# Patient Record
Sex: Female | Born: 1945
Health system: Southern US, Community
[De-identification: ages and names within clinical notes are randomized; demographics above are authoritative.]

## PROBLEM LIST (undated history)

## (undated) DIAGNOSIS — N183 Chronic kidney disease, stage 3 unspecified: Secondary | ICD-10-CM

## (undated) DIAGNOSIS — F40240 Claustrophobia: Secondary | ICD-10-CM

## (undated) DIAGNOSIS — Z87442 Personal history of urinary calculi: Secondary | ICD-10-CM

## (undated) DIAGNOSIS — I350 Nonrheumatic aortic (valve) stenosis: Secondary | ICD-10-CM

## (undated) DIAGNOSIS — F419 Anxiety disorder, unspecified: Secondary | ICD-10-CM

## (undated) DIAGNOSIS — M1990 Unspecified osteoarthritis, unspecified site: Secondary | ICD-10-CM

## (undated) DIAGNOSIS — C50919 Malignant neoplasm of unspecified site of unspecified female breast: Secondary | ICD-10-CM

## (undated) DIAGNOSIS — R011 Cardiac murmur, unspecified: Secondary | ICD-10-CM

## (undated) DIAGNOSIS — K219 Gastro-esophageal reflux disease without esophagitis: Secondary | ICD-10-CM

## (undated) DIAGNOSIS — N182 Chronic kidney disease, stage 2 (mild): Secondary | ICD-10-CM

## (undated) DIAGNOSIS — E559 Vitamin D deficiency, unspecified: Secondary | ICD-10-CM

## (undated) DIAGNOSIS — R609 Edema, unspecified: Secondary | ICD-10-CM

## (undated) DIAGNOSIS — E790 Hyperuricemia without signs of inflammatory arthritis and tophaceous disease: Secondary | ICD-10-CM

## (undated) DIAGNOSIS — M48061 Spinal stenosis, lumbar region without neurogenic claudication: Secondary | ICD-10-CM

## (undated) DIAGNOSIS — I359 Nonrheumatic aortic valve disorder, unspecified: Secondary | ICD-10-CM

## (undated) DIAGNOSIS — I129 Hypertensive chronic kidney disease with stage 1 through stage 4 chronic kidney disease, or unspecified chronic kidney disease: Secondary | ICD-10-CM

## (undated) HISTORY — PX: ABDOMINAL HYSTERECTOMY: SUR658

## (undated) HISTORY — PX: CHOLECYSTECTOMY: SHX55

## (undated) HISTORY — PX: BACK SURGERY: SHX140

## (undated) HISTORY — PX: APPENDECTOMY: SHX54

## (undated) HISTORY — DX: Hypertensive chronic kidney disease with stage 1 through stage 4 chronic kidney disease, or unspecified chronic kidney disease: N18.2

## (undated) HISTORY — DX: Unspecified osteoarthritis, unspecified site: M19.90

## (undated) HISTORY — PX: GALLBLADDER SURGERY: SHX652

## (undated) HISTORY — DX: Hyperuricemia without signs of inflammatory arthritis and tophaceous disease: E79.0

## (undated) HISTORY — DX: Edema, unspecified: R60.9

## (undated) HISTORY — DX: Vitamin D deficiency, unspecified: E55.9

## (undated) HISTORY — DX: Nonrheumatic aortic valve disorder, unspecified: I35.9

## (undated) HISTORY — PX: TONSILLECTOMY: SUR1361

## (undated) HISTORY — PX: ABDOMINAL HYSTERECTOMY: SHX81

## (undated) HISTORY — DX: Spinal stenosis, lumbar region without neurogenic claudication: M48.061

## (undated) HISTORY — DX: Gastro-esophageal reflux disease without esophagitis: K21.9

## (undated) HISTORY — DX: Hypertensive chronic kidney disease with stage 1 through stage 4 chronic kidney disease, or unspecified chronic kidney disease: I12.9

---

## 2000-05-25 ENCOUNTER — Emergency Department (HOSPITAL_COMMUNITY): Admission: EM | Admit: 2000-05-25 | Discharge: 2000-05-25 | Payer: Self-pay | Admitting: Emergency Medicine

## 2014-02-27 ENCOUNTER — Other Ambulatory Visit: Payer: Self-pay | Admitting: Orthopedic Surgery

## 2014-02-27 DIAGNOSIS — M5417 Radiculopathy, lumbosacral region: Secondary | ICD-10-CM

## 2014-03-13 ENCOUNTER — Other Ambulatory Visit: Payer: Self-pay

## 2014-04-08 DIAGNOSIS — R05 Cough: Secondary | ICD-10-CM | POA: Diagnosis not present

## 2014-04-08 DIAGNOSIS — Z6841 Body Mass Index (BMI) 40.0 and over, adult: Secondary | ICD-10-CM | POA: Diagnosis not present

## 2014-05-19 DIAGNOSIS — R768 Other specified abnormal immunological findings in serum: Secondary | ICD-10-CM | POA: Diagnosis not present

## 2014-05-19 DIAGNOSIS — M2550 Pain in unspecified joint: Secondary | ICD-10-CM | POA: Diagnosis not present

## 2014-05-19 DIAGNOSIS — M2560 Stiffness of unspecified joint, not elsewhere classified: Secondary | ICD-10-CM | POA: Diagnosis not present

## 2014-05-19 DIAGNOSIS — R7982 Elevated C-reactive protein (CRP): Secondary | ICD-10-CM | POA: Diagnosis not present

## 2014-06-18 DIAGNOSIS — H524 Presbyopia: Secondary | ICD-10-CM | POA: Diagnosis not present

## 2014-07-14 DIAGNOSIS — M2550 Pain in unspecified joint: Secondary | ICD-10-CM | POA: Diagnosis not present

## 2014-07-28 DIAGNOSIS — M2550 Pain in unspecified joint: Secondary | ICD-10-CM | POA: Diagnosis not present

## 2014-07-28 DIAGNOSIS — Z7952 Long term (current) use of systemic steroids: Secondary | ICD-10-CM | POA: Diagnosis not present

## 2014-08-13 DIAGNOSIS — Z6841 Body Mass Index (BMI) 40.0 and over, adult: Secondary | ICD-10-CM | POA: Diagnosis not present

## 2014-08-13 DIAGNOSIS — R05 Cough: Secondary | ICD-10-CM | POA: Diagnosis not present

## 2014-08-13 DIAGNOSIS — J329 Chronic sinusitis, unspecified: Secondary | ICD-10-CM | POA: Diagnosis not present

## 2014-09-08 DIAGNOSIS — M150 Primary generalized (osteo)arthritis: Secondary | ICD-10-CM | POA: Diagnosis not present

## 2014-09-08 DIAGNOSIS — M2550 Pain in unspecified joint: Secondary | ICD-10-CM | POA: Diagnosis not present

## 2014-09-08 DIAGNOSIS — Z7952 Long term (current) use of systemic steroids: Secondary | ICD-10-CM | POA: Diagnosis not present

## 2014-09-18 DIAGNOSIS — Z1231 Encounter for screening mammogram for malignant neoplasm of breast: Secondary | ICD-10-CM | POA: Diagnosis not present

## 2014-09-18 DIAGNOSIS — E782 Mixed hyperlipidemia: Secondary | ICD-10-CM | POA: Diagnosis not present

## 2014-09-18 DIAGNOSIS — I129 Hypertensive chronic kidney disease with stage 1 through stage 4 chronic kidney disease, or unspecified chronic kidney disease: Secondary | ICD-10-CM | POA: Diagnosis not present

## 2014-09-18 DIAGNOSIS — Z1211 Encounter for screening for malignant neoplasm of colon: Secondary | ICD-10-CM | POA: Diagnosis not present

## 2014-09-18 DIAGNOSIS — N182 Chronic kidney disease, stage 2 (mild): Secondary | ICD-10-CM | POA: Diagnosis not present

## 2014-09-18 DIAGNOSIS — Z0000 Encounter for general adult medical examination without abnormal findings: Secondary | ICD-10-CM | POA: Diagnosis not present

## 2014-11-03 DIAGNOSIS — M2011 Hallux valgus (acquired), right foot: Secondary | ICD-10-CM | POA: Diagnosis not present

## 2014-11-03 DIAGNOSIS — M2012 Hallux valgus (acquired), left foot: Secondary | ICD-10-CM | POA: Diagnosis not present

## 2014-11-03 DIAGNOSIS — M19071 Primary osteoarthritis, right ankle and foot: Secondary | ICD-10-CM | POA: Diagnosis not present

## 2014-11-03 DIAGNOSIS — M19072 Primary osteoarthritis, left ankle and foot: Secondary | ICD-10-CM | POA: Diagnosis not present

## 2014-12-11 DIAGNOSIS — M79604 Pain in right leg: Secondary | ICD-10-CM | POA: Diagnosis not present

## 2014-12-11 DIAGNOSIS — E790 Hyperuricemia without signs of inflammatory arthritis and tophaceous disease: Secondary | ICD-10-CM | POA: Diagnosis not present

## 2014-12-11 DIAGNOSIS — M79671 Pain in right foot: Secondary | ICD-10-CM | POA: Diagnosis not present

## 2014-12-11 DIAGNOSIS — M79605 Pain in left leg: Secondary | ICD-10-CM | POA: Diagnosis not present

## 2015-01-01 DIAGNOSIS — H6691 Otitis media, unspecified, right ear: Secondary | ICD-10-CM | POA: Diagnosis not present

## 2015-01-01 DIAGNOSIS — Z6841 Body Mass Index (BMI) 40.0 and over, adult: Secondary | ICD-10-CM | POA: Diagnosis not present

## 2015-01-01 DIAGNOSIS — Z23 Encounter for immunization: Secondary | ICD-10-CM | POA: Diagnosis not present

## 2015-02-09 DIAGNOSIS — Z6841 Body Mass Index (BMI) 40.0 and over, adult: Secondary | ICD-10-CM | POA: Diagnosis not present

## 2015-02-09 DIAGNOSIS — M2550 Pain in unspecified joint: Secondary | ICD-10-CM | POA: Diagnosis not present

## 2015-03-10 DIAGNOSIS — R609 Edema, unspecified: Secondary | ICD-10-CM | POA: Diagnosis not present

## 2015-03-10 DIAGNOSIS — I5030 Unspecified diastolic (congestive) heart failure: Secondary | ICD-10-CM | POA: Diagnosis not present

## 2015-03-10 DIAGNOSIS — Z6841 Body Mass Index (BMI) 40.0 and over, adult: Secondary | ICD-10-CM | POA: Diagnosis not present

## 2015-04-27 DIAGNOSIS — J019 Acute sinusitis, unspecified: Secondary | ICD-10-CM | POA: Diagnosis not present

## 2015-04-27 DIAGNOSIS — R42 Dizziness and giddiness: Secondary | ICD-10-CM | POA: Diagnosis not present

## 2015-04-27 DIAGNOSIS — H5203 Hypermetropia, bilateral: Secondary | ICD-10-CM | POA: Diagnosis not present

## 2015-04-27 DIAGNOSIS — M2550 Pain in unspecified joint: Secondary | ICD-10-CM | POA: Diagnosis not present

## 2015-06-03 DIAGNOSIS — M79604 Pain in right leg: Secondary | ICD-10-CM | POA: Diagnosis not present

## 2015-06-03 DIAGNOSIS — M79605 Pain in left leg: Secondary | ICD-10-CM | POA: Diagnosis not present

## 2015-06-12 DIAGNOSIS — M461 Sacroiliitis, not elsewhere classified: Secondary | ICD-10-CM | POA: Diagnosis not present

## 2015-06-26 DIAGNOSIS — M461 Sacroiliitis, not elsewhere classified: Secondary | ICD-10-CM | POA: Diagnosis not present

## 2015-07-20 DIAGNOSIS — E782 Mixed hyperlipidemia: Secondary | ICD-10-CM | POA: Diagnosis not present

## 2015-07-20 DIAGNOSIS — K219 Gastro-esophageal reflux disease without esophagitis: Secondary | ICD-10-CM | POA: Diagnosis not present

## 2015-07-20 DIAGNOSIS — N182 Chronic kidney disease, stage 2 (mild): Secondary | ICD-10-CM | POA: Diagnosis not present

## 2015-07-20 DIAGNOSIS — I129 Hypertensive chronic kidney disease with stage 1 through stage 4 chronic kidney disease, or unspecified chronic kidney disease: Secondary | ICD-10-CM | POA: Diagnosis not present

## 2015-07-20 DIAGNOSIS — E790 Hyperuricemia without signs of inflammatory arthritis and tophaceous disease: Secondary | ICD-10-CM | POA: Diagnosis not present

## 2015-07-28 DIAGNOSIS — E870 Hyperosmolality and hypernatremia: Secondary | ICD-10-CM | POA: Diagnosis not present

## 2015-07-30 DIAGNOSIS — M25571 Pain in right ankle and joints of right foot: Secondary | ICD-10-CM | POA: Diagnosis not present

## 2015-07-30 DIAGNOSIS — M25572 Pain in left ankle and joints of left foot: Secondary | ICD-10-CM | POA: Diagnosis not present

## 2015-07-30 DIAGNOSIS — M25671 Stiffness of right ankle, not elsewhere classified: Secondary | ICD-10-CM | POA: Diagnosis not present

## 2015-07-30 DIAGNOSIS — M25672 Stiffness of left ankle, not elsewhere classified: Secondary | ICD-10-CM | POA: Diagnosis not present

## 2015-08-04 DIAGNOSIS — M25671 Stiffness of right ankle, not elsewhere classified: Secondary | ICD-10-CM | POA: Diagnosis not present

## 2015-08-04 DIAGNOSIS — M25672 Stiffness of left ankle, not elsewhere classified: Secondary | ICD-10-CM | POA: Diagnosis not present

## 2015-08-04 DIAGNOSIS — M25571 Pain in right ankle and joints of right foot: Secondary | ICD-10-CM | POA: Diagnosis not present

## 2015-08-04 DIAGNOSIS — M25572 Pain in left ankle and joints of left foot: Secondary | ICD-10-CM | POA: Diagnosis not present

## 2015-08-14 DIAGNOSIS — M25672 Stiffness of left ankle, not elsewhere classified: Secondary | ICD-10-CM | POA: Diagnosis not present

## 2015-08-14 DIAGNOSIS — M25572 Pain in left ankle and joints of left foot: Secondary | ICD-10-CM | POA: Diagnosis not present

## 2015-08-14 DIAGNOSIS — M25671 Stiffness of right ankle, not elsewhere classified: Secondary | ICD-10-CM | POA: Diagnosis not present

## 2015-08-14 DIAGNOSIS — M25571 Pain in right ankle and joints of right foot: Secondary | ICD-10-CM | POA: Diagnosis not present

## 2015-08-27 DIAGNOSIS — M25671 Stiffness of right ankle, not elsewhere classified: Secondary | ICD-10-CM | POA: Diagnosis not present

## 2015-08-27 DIAGNOSIS — M25672 Stiffness of left ankle, not elsewhere classified: Secondary | ICD-10-CM | POA: Diagnosis not present

## 2015-08-27 DIAGNOSIS — M25571 Pain in right ankle and joints of right foot: Secondary | ICD-10-CM | POA: Diagnosis not present

## 2015-08-27 DIAGNOSIS — M25572 Pain in left ankle and joints of left foot: Secondary | ICD-10-CM | POA: Diagnosis not present

## 2015-09-02 DIAGNOSIS — M25571 Pain in right ankle and joints of right foot: Secondary | ICD-10-CM | POA: Diagnosis not present

## 2015-09-02 DIAGNOSIS — M25672 Stiffness of left ankle, not elsewhere classified: Secondary | ICD-10-CM | POA: Diagnosis not present

## 2015-09-02 DIAGNOSIS — M25671 Stiffness of right ankle, not elsewhere classified: Secondary | ICD-10-CM | POA: Diagnosis not present

## 2015-09-02 DIAGNOSIS — M25572 Pain in left ankle and joints of left foot: Secondary | ICD-10-CM | POA: Diagnosis not present

## 2015-09-11 DIAGNOSIS — M25672 Stiffness of left ankle, not elsewhere classified: Secondary | ICD-10-CM | POA: Diagnosis not present

## 2015-09-11 DIAGNOSIS — M25671 Stiffness of right ankle, not elsewhere classified: Secondary | ICD-10-CM | POA: Diagnosis not present

## 2015-09-11 DIAGNOSIS — M25572 Pain in left ankle and joints of left foot: Secondary | ICD-10-CM | POA: Diagnosis not present

## 2015-09-11 DIAGNOSIS — M25571 Pain in right ankle and joints of right foot: Secondary | ICD-10-CM | POA: Diagnosis not present

## 2015-09-23 DIAGNOSIS — M25672 Stiffness of left ankle, not elsewhere classified: Secondary | ICD-10-CM | POA: Diagnosis not present

## 2015-09-23 DIAGNOSIS — M25571 Pain in right ankle and joints of right foot: Secondary | ICD-10-CM | POA: Diagnosis not present

## 2015-09-23 DIAGNOSIS — M25572 Pain in left ankle and joints of left foot: Secondary | ICD-10-CM | POA: Diagnosis not present

## 2015-09-23 DIAGNOSIS — M25671 Stiffness of right ankle, not elsewhere classified: Secondary | ICD-10-CM | POA: Diagnosis not present

## 2015-10-01 DIAGNOSIS — M25572 Pain in left ankle and joints of left foot: Secondary | ICD-10-CM | POA: Diagnosis not present

## 2015-10-01 DIAGNOSIS — M25671 Stiffness of right ankle, not elsewhere classified: Secondary | ICD-10-CM | POA: Diagnosis not present

## 2015-10-01 DIAGNOSIS — M25672 Stiffness of left ankle, not elsewhere classified: Secondary | ICD-10-CM | POA: Diagnosis not present

## 2015-10-01 DIAGNOSIS — M25571 Pain in right ankle and joints of right foot: Secondary | ICD-10-CM | POA: Diagnosis not present

## 2015-10-09 DIAGNOSIS — M25551 Pain in right hip: Secondary | ICD-10-CM | POA: Diagnosis not present

## 2015-10-09 DIAGNOSIS — M25572 Pain in left ankle and joints of left foot: Secondary | ICD-10-CM | POA: Diagnosis not present

## 2015-10-09 DIAGNOSIS — M25571 Pain in right ankle and joints of right foot: Secondary | ICD-10-CM | POA: Diagnosis not present

## 2015-10-09 DIAGNOSIS — M5441 Lumbago with sciatica, right side: Secondary | ICD-10-CM | POA: Diagnosis not present

## 2015-10-16 DIAGNOSIS — M25551 Pain in right hip: Secondary | ICD-10-CM | POA: Diagnosis not present

## 2015-10-16 DIAGNOSIS — M5441 Lumbago with sciatica, right side: Secondary | ICD-10-CM | POA: Diagnosis not present

## 2015-10-16 DIAGNOSIS — M25571 Pain in right ankle and joints of right foot: Secondary | ICD-10-CM | POA: Diagnosis not present

## 2015-10-16 DIAGNOSIS — M25572 Pain in left ankle and joints of left foot: Secondary | ICD-10-CM | POA: Diagnosis not present

## 2015-10-21 DIAGNOSIS — M25551 Pain in right hip: Secondary | ICD-10-CM | POA: Diagnosis not present

## 2015-10-21 DIAGNOSIS — M25571 Pain in right ankle and joints of right foot: Secondary | ICD-10-CM | POA: Diagnosis not present

## 2015-10-21 DIAGNOSIS — M25572 Pain in left ankle and joints of left foot: Secondary | ICD-10-CM | POA: Diagnosis not present

## 2015-10-21 DIAGNOSIS — M5441 Lumbago with sciatica, right side: Secondary | ICD-10-CM | POA: Diagnosis not present

## 2015-11-05 DIAGNOSIS — M25551 Pain in right hip: Secondary | ICD-10-CM | POA: Diagnosis not present

## 2015-11-05 DIAGNOSIS — M25572 Pain in left ankle and joints of left foot: Secondary | ICD-10-CM | POA: Diagnosis not present

## 2015-11-05 DIAGNOSIS — M25571 Pain in right ankle and joints of right foot: Secondary | ICD-10-CM | POA: Diagnosis not present

## 2015-11-05 DIAGNOSIS — M5441 Lumbago with sciatica, right side: Secondary | ICD-10-CM | POA: Diagnosis not present

## 2015-11-19 DIAGNOSIS — M25572 Pain in left ankle and joints of left foot: Secondary | ICD-10-CM | POA: Diagnosis not present

## 2015-11-19 DIAGNOSIS — M5441 Lumbago with sciatica, right side: Secondary | ICD-10-CM | POA: Diagnosis not present

## 2015-11-19 DIAGNOSIS — M25551 Pain in right hip: Secondary | ICD-10-CM | POA: Diagnosis not present

## 2015-11-19 DIAGNOSIS — M25571 Pain in right ankle and joints of right foot: Secondary | ICD-10-CM | POA: Diagnosis not present

## 2015-11-27 DIAGNOSIS — M25551 Pain in right hip: Secondary | ICD-10-CM | POA: Diagnosis not present

## 2015-11-27 DIAGNOSIS — M5441 Lumbago with sciatica, right side: Secondary | ICD-10-CM | POA: Diagnosis not present

## 2015-11-27 DIAGNOSIS — M25571 Pain in right ankle and joints of right foot: Secondary | ICD-10-CM | POA: Diagnosis not present

## 2015-11-27 DIAGNOSIS — M25572 Pain in left ankle and joints of left foot: Secondary | ICD-10-CM | POA: Diagnosis not present

## 2015-12-16 DIAGNOSIS — Z23 Encounter for immunization: Secondary | ICD-10-CM | POA: Diagnosis not present

## 2015-12-16 DIAGNOSIS — M791 Myalgia: Secondary | ICD-10-CM | POA: Diagnosis not present

## 2016-01-06 DIAGNOSIS — M79662 Pain in left lower leg: Secondary | ICD-10-CM | POA: Diagnosis not present

## 2016-01-06 DIAGNOSIS — I10 Essential (primary) hypertension: Secondary | ICD-10-CM | POA: Diagnosis not present

## 2016-01-06 DIAGNOSIS — M25462 Effusion, left knee: Secondary | ICD-10-CM | POA: Diagnosis not present

## 2016-01-06 DIAGNOSIS — R52 Pain, unspecified: Secondary | ICD-10-CM | POA: Diagnosis not present

## 2016-01-06 DIAGNOSIS — E7800 Pure hypercholesterolemia, unspecified: Secondary | ICD-10-CM | POA: Diagnosis not present

## 2016-01-06 DIAGNOSIS — M79605 Pain in left leg: Secondary | ICD-10-CM | POA: Diagnosis not present

## 2016-01-06 DIAGNOSIS — M7989 Other specified soft tissue disorders: Secondary | ICD-10-CM | POA: Diagnosis not present

## 2016-01-12 DIAGNOSIS — M1712 Unilateral primary osteoarthritis, left knee: Secondary | ICD-10-CM | POA: Diagnosis not present

## 2016-01-18 DIAGNOSIS — N182 Chronic kidney disease, stage 2 (mild): Secondary | ICD-10-CM | POA: Diagnosis not present

## 2016-01-18 DIAGNOSIS — E790 Hyperuricemia without signs of inflammatory arthritis and tophaceous disease: Secondary | ICD-10-CM | POA: Diagnosis not present

## 2016-01-18 DIAGNOSIS — I129 Hypertensive chronic kidney disease with stage 1 through stage 4 chronic kidney disease, or unspecified chronic kidney disease: Secondary | ICD-10-CM | POA: Diagnosis not present

## 2016-01-18 DIAGNOSIS — E782 Mixed hyperlipidemia: Secondary | ICD-10-CM | POA: Diagnosis not present

## 2016-01-26 DIAGNOSIS — M1712 Unilateral primary osteoarthritis, left knee: Secondary | ICD-10-CM | POA: Diagnosis not present

## 2016-02-02 DIAGNOSIS — M1712 Unilateral primary osteoarthritis, left knee: Secondary | ICD-10-CM | POA: Diagnosis not present

## 2016-02-02 DIAGNOSIS — I129 Hypertensive chronic kidney disease with stage 1 through stage 4 chronic kidney disease, or unspecified chronic kidney disease: Secondary | ICD-10-CM | POA: Diagnosis not present

## 2016-02-02 DIAGNOSIS — M2550 Pain in unspecified joint: Secondary | ICD-10-CM | POA: Diagnosis not present

## 2016-02-02 DIAGNOSIS — E782 Mixed hyperlipidemia: Secondary | ICD-10-CM | POA: Diagnosis not present

## 2016-03-08 DIAGNOSIS — J0110 Acute frontal sinusitis, unspecified: Secondary | ICD-10-CM | POA: Diagnosis not present

## 2016-03-21 DIAGNOSIS — M7062 Trochanteric bursitis, left hip: Secondary | ICD-10-CM | POA: Diagnosis not present

## 2016-04-12 DIAGNOSIS — M1712 Unilateral primary osteoarthritis, left knee: Secondary | ICD-10-CM | POA: Diagnosis not present

## 2016-04-25 DIAGNOSIS — H25813 Combined forms of age-related cataract, bilateral: Secondary | ICD-10-CM | POA: Diagnosis not present

## 2016-05-02 DIAGNOSIS — M7062 Trochanteric bursitis, left hip: Secondary | ICD-10-CM | POA: Diagnosis not present

## 2016-05-25 DIAGNOSIS — M48062 Spinal stenosis, lumbar region with neurogenic claudication: Secondary | ICD-10-CM | POA: Diagnosis not present

## 2016-05-31 ENCOUNTER — Other Ambulatory Visit (HOSPITAL_COMMUNITY): Payer: Self-pay | Admitting: Orthopedic Surgery

## 2016-05-31 DIAGNOSIS — M48062 Spinal stenosis, lumbar region with neurogenic claudication: Secondary | ICD-10-CM

## 2016-06-13 DIAGNOSIS — M79605 Pain in left leg: Secondary | ICD-10-CM | POA: Diagnosis not present

## 2016-06-13 DIAGNOSIS — M79604 Pain in right leg: Secondary | ICD-10-CM | POA: Diagnosis not present

## 2016-06-13 DIAGNOSIS — I5030 Unspecified diastolic (congestive) heart failure: Secondary | ICD-10-CM | POA: Diagnosis not present

## 2016-06-22 DIAGNOSIS — M79604 Pain in right leg: Secondary | ICD-10-CM | POA: Diagnosis not present

## 2016-06-22 DIAGNOSIS — M79605 Pain in left leg: Secondary | ICD-10-CM | POA: Diagnosis not present

## 2016-06-22 DIAGNOSIS — R600 Localized edema: Secondary | ICD-10-CM | POA: Diagnosis not present

## 2016-06-27 DIAGNOSIS — M79605 Pain in left leg: Secondary | ICD-10-CM | POA: Diagnosis not present

## 2016-06-27 DIAGNOSIS — M79604 Pain in right leg: Secondary | ICD-10-CM | POA: Diagnosis not present

## 2016-06-30 ENCOUNTER — Ambulatory Visit (HOSPITAL_COMMUNITY): Payer: Medicare Other

## 2016-06-30 ENCOUNTER — Encounter (HOSPITAL_COMMUNITY): Admission: RE | Payer: Self-pay | Source: Ambulatory Visit

## 2016-06-30 ENCOUNTER — Encounter (HOSPITAL_COMMUNITY): Payer: Self-pay

## 2016-06-30 ENCOUNTER — Ambulatory Visit (HOSPITAL_COMMUNITY): Admission: RE | Admit: 2016-06-30 | Payer: Medicare Other | Source: Ambulatory Visit | Admitting: Orthopedic Surgery

## 2016-06-30 SURGERY — RADIOLOGY WITH ANESTHESIA
Anesthesia: General

## 2016-07-04 DIAGNOSIS — M79604 Pain in right leg: Secondary | ICD-10-CM | POA: Diagnosis not present

## 2016-07-04 DIAGNOSIS — M79605 Pain in left leg: Secondary | ICD-10-CM | POA: Diagnosis not present

## 2016-07-12 DIAGNOSIS — M1712 Unilateral primary osteoarthritis, left knee: Secondary | ICD-10-CM | POA: Diagnosis not present

## 2016-07-12 DIAGNOSIS — M48062 Spinal stenosis, lumbar region with neurogenic claudication: Secondary | ICD-10-CM | POA: Diagnosis not present

## 2016-08-17 DIAGNOSIS — G894 Chronic pain syndrome: Secondary | ICD-10-CM | POA: Diagnosis not present

## 2016-08-17 DIAGNOSIS — M5136 Other intervertebral disc degeneration, lumbar region: Secondary | ICD-10-CM | POA: Diagnosis not present

## 2016-08-17 DIAGNOSIS — M47816 Spondylosis without myelopathy or radiculopathy, lumbar region: Secondary | ICD-10-CM | POA: Diagnosis not present

## 2016-08-17 DIAGNOSIS — M179 Osteoarthritis of knee, unspecified: Secondary | ICD-10-CM | POA: Diagnosis not present

## 2016-08-25 DIAGNOSIS — M47816 Spondylosis without myelopathy or radiculopathy, lumbar region: Secondary | ICD-10-CM | POA: Diagnosis not present

## 2016-09-07 DIAGNOSIS — G894 Chronic pain syndrome: Secondary | ICD-10-CM | POA: Diagnosis not present

## 2016-09-07 DIAGNOSIS — M179 Osteoarthritis of knee, unspecified: Secondary | ICD-10-CM | POA: Diagnosis not present

## 2016-09-07 DIAGNOSIS — M47816 Spondylosis without myelopathy or radiculopathy, lumbar region: Secondary | ICD-10-CM | POA: Diagnosis not present

## 2016-09-07 DIAGNOSIS — M5136 Other intervertebral disc degeneration, lumbar region: Secondary | ICD-10-CM | POA: Diagnosis not present

## 2016-10-26 DIAGNOSIS — M47816 Spondylosis without myelopathy or radiculopathy, lumbar region: Secondary | ICD-10-CM | POA: Diagnosis not present

## 2016-11-09 DIAGNOSIS — M179 Osteoarthritis of knee, unspecified: Secondary | ICD-10-CM | POA: Diagnosis not present

## 2016-11-09 DIAGNOSIS — M5136 Other intervertebral disc degeneration, lumbar region: Secondary | ICD-10-CM | POA: Diagnosis not present

## 2016-11-09 DIAGNOSIS — M47816 Spondylosis without myelopathy or radiculopathy, lumbar region: Secondary | ICD-10-CM | POA: Diagnosis not present

## 2016-11-29 DIAGNOSIS — Z23 Encounter for immunization: Secondary | ICD-10-CM | POA: Diagnosis not present

## 2016-11-30 DIAGNOSIS — Z23 Encounter for immunization: Secondary | ICD-10-CM | POA: Diagnosis not present

## 2016-11-30 DIAGNOSIS — M79604 Pain in right leg: Secondary | ICD-10-CM | POA: Diagnosis not present

## 2016-11-30 DIAGNOSIS — R5383 Other fatigue: Secondary | ICD-10-CM | POA: Diagnosis not present

## 2016-11-30 DIAGNOSIS — M79605 Pain in left leg: Secondary | ICD-10-CM | POA: Diagnosis not present

## 2016-11-30 DIAGNOSIS — Z1389 Encounter for screening for other disorder: Secondary | ICD-10-CM | POA: Diagnosis not present

## 2016-11-30 DIAGNOSIS — Z139 Encounter for screening, unspecified: Secondary | ICD-10-CM | POA: Diagnosis not present

## 2016-12-14 DIAGNOSIS — M47816 Spondylosis without myelopathy or radiculopathy, lumbar region: Secondary | ICD-10-CM | POA: Diagnosis not present

## 2016-12-14 DIAGNOSIS — M5136 Other intervertebral disc degeneration, lumbar region: Secondary | ICD-10-CM | POA: Diagnosis not present

## 2016-12-28 DIAGNOSIS — M5136 Other intervertebral disc degeneration, lumbar region: Secondary | ICD-10-CM | POA: Diagnosis not present

## 2016-12-28 DIAGNOSIS — M179 Osteoarthritis of knee, unspecified: Secondary | ICD-10-CM | POA: Diagnosis not present

## 2016-12-28 DIAGNOSIS — M47816 Spondylosis without myelopathy or radiculopathy, lumbar region: Secondary | ICD-10-CM | POA: Diagnosis not present

## 2017-01-02 DIAGNOSIS — E7800 Pure hypercholesterolemia, unspecified: Secondary | ICD-10-CM | POA: Diagnosis not present

## 2017-01-02 DIAGNOSIS — Z0000 Encounter for general adult medical examination without abnormal findings: Secondary | ICD-10-CM | POA: Diagnosis not present

## 2017-01-02 DIAGNOSIS — Z131 Encounter for screening for diabetes mellitus: Secondary | ICD-10-CM | POA: Diagnosis not present

## 2017-01-02 DIAGNOSIS — I5030 Unspecified diastolic (congestive) heart failure: Secondary | ICD-10-CM | POA: Diagnosis not present

## 2017-01-12 DIAGNOSIS — M179 Osteoarthritis of knee, unspecified: Secondary | ICD-10-CM | POA: Diagnosis not present

## 2017-02-17 DIAGNOSIS — M5136 Other intervertebral disc degeneration, lumbar region: Secondary | ICD-10-CM | POA: Diagnosis not present

## 2017-02-17 DIAGNOSIS — M25551 Pain in right hip: Secondary | ICD-10-CM | POA: Diagnosis not present

## 2017-02-17 DIAGNOSIS — G894 Chronic pain syndrome: Secondary | ICD-10-CM | POA: Diagnosis not present

## 2017-02-17 DIAGNOSIS — M47816 Spondylosis without myelopathy or radiculopathy, lumbar region: Secondary | ICD-10-CM | POA: Diagnosis not present

## 2017-02-22 DIAGNOSIS — Q253 Supravalvular aortic stenosis: Secondary | ICD-10-CM | POA: Diagnosis not present

## 2017-02-22 DIAGNOSIS — R600 Localized edema: Secondary | ICD-10-CM | POA: Diagnosis not present

## 2017-02-22 DIAGNOSIS — E559 Vitamin D deficiency, unspecified: Secondary | ICD-10-CM | POA: Diagnosis not present

## 2017-03-01 DIAGNOSIS — M159 Polyosteoarthritis, unspecified: Secondary | ICD-10-CM | POA: Diagnosis not present

## 2017-03-01 DIAGNOSIS — R600 Localized edema: Secondary | ICD-10-CM | POA: Diagnosis not present

## 2017-03-13 DIAGNOSIS — Z7189 Other specified counseling: Secondary | ICD-10-CM | POA: Diagnosis not present

## 2017-03-13 DIAGNOSIS — M48061 Spinal stenosis, lumbar region without neurogenic claudication: Secondary | ICD-10-CM | POA: Diagnosis not present

## 2017-03-17 ENCOUNTER — Ambulatory Visit: Payer: Medicare Other | Admitting: Neurology

## 2017-03-17 ENCOUNTER — Encounter: Payer: Self-pay | Admitting: Neurology

## 2017-03-17 VITALS — BP 128/80 | HR 82 | Ht 66.0 in | Wt 269.0 lb

## 2017-03-17 DIAGNOSIS — M79604 Pain in right leg: Secondary | ICD-10-CM

## 2017-03-17 DIAGNOSIS — IMO0001 Reserved for inherently not codable concepts without codable children: Secondary | ICD-10-CM

## 2017-03-17 DIAGNOSIS — S76399S Other specified injury of muscle, fascia and tendon of the posterior muscle group at thigh level, unspecified thigh, sequela: Principal | ICD-10-CM

## 2017-03-17 DIAGNOSIS — R200 Anesthesia of skin: Secondary | ICD-10-CM | POA: Diagnosis not present

## 2017-03-17 DIAGNOSIS — M79605 Pain in left leg: Secondary | ICD-10-CM

## 2017-03-17 DIAGNOSIS — S73199S Other sprain of unspecified hip, sequela: Secondary | ICD-10-CM | POA: Diagnosis not present

## 2017-03-17 MED ORDER — DICLOFENAC SODIUM 1 % TD GEL: 2.0000 g | Freq: Three times a day (TID) | TRANSDERMAL | 3 refills | Status: DC | PRN

## 2017-03-17 NOTE — Patient Instructions (Addendum)
Start diclofenac ointment to the back of the knees.  You can also try over the counter biofreeze, or nonsteroidal ointments  NCS/EMG of the legs  Referral to Dr. Tamala Julian with Sport Medicine

## 2017-03-17 NOTE — Progress Notes (Addendum)
[facility] Clinic Note - Initial Visit   Date: 03/17/17  Elizabeth [name] MRN: [id number] DOB: 04/14/1945   Dear Dr. Nyra Capes:  Thank you for your kind referral of Elizabeth [name] for consultation of bilateral leg pain. Although her history is well known to you, please allow [place] to reiterate it for the purpose of our medical record. The patient was accompanied to the clinic by granddaugther who also provides collateral information.     History of Present Illness: Elizabeth [name] is a 72 y.o. left-handed Caucasian female with morbid obesity, GERD, hypertension, CKD, and OA presenting for evaluation of bilateral leg pain.    Starting in October 2015, she suffered a fall because of sudden onset of left lateral leg pain and was unable to walk. There was no preceding injury or trauma.  She does not recall having any associated low back pain.  Around the same time, she developed similar discomfort on the right side.  The pain is described as sharp, burning, and sore, as if she has exercised the muscle too much.  It is located over the posterior thigh/buttocks and much worse distally behind the knee and hamstring insertion.  Her hamstrings are very sore and tender.   She does not have any pain with sitting or laying, but immediately develops pain with weight bearing such as standing or walking.  She denies any tingling, weakness, or radicular pain.  She has noticed new numbness over the distal sole of the right foot, but there is no associated pain and it does not correlate with her leg pain.  Pain was so severe initially that she required using crutches to ambulate.  Over the past year, she has been evaluated by orth[facility]adics, Dr. Donivan Scull.  She was offered NSAIDs, prednisone, muscle relaxants, tramadol, gabapentin, horizant, xtampsa, and hydrocodone with no relief.   She has some benefit with TENs unit.  She went to physical therapy for leg for possible nerve impingement.  She was unable  to get MRI (including open MRI) performed due to extreme anxiety.   Past Medical History:  Diagnosis Date  . Aortic valve disorder   . Benign hypertension with CKD (chronic kidney disease), stage II   . Edema   . GERD (gastroesophageal reflux disease)   . Hyperuricemia   . Lumbar stenosis   . Osteoarthrosis   . Vitamin D deficiency     Past Surgical History:  Procedure Laterality Date  . ABDOMINAL HYSTERECTOMY    . GALLBLADDER SURGERY       Medications:  Outpatient Encounter Medications as of 03/17/2017  Medication Sig  . cyanocobalamin 100 MCG tablet Take 100 mcg by mouth daily.  Marland Kitchen oxyCODONE-acetaminophen (PERCOCET/ROXICET) 5-325 MG tablet Take by mouth every 4 (four) hours as needed for severe pain.  Marland Kitchen diclofenac sodium (VOLTAREN) 1 % GEL Apply 2 g topically 3 (three) times daily as needed.   No facility-administered encounter medications on file as of 03/17/2017.      Allergies:  Allergies  Allergen Reactions  . Hydrocodone   . Lipitor [Atorvastatin]   . Penicillins   . Prednisone     Family History: Mother passed from Alzheimer's disease, 45. She has a sister who is healthy.  Social History: Social History   Tobacco Use  . Smoking status: Never Smoker  . Smokeless tobacco: Never Used  Substance Use Topics  . Alcohol use: No    Frequency: Never  . Drug use: No   Social History   Social  History Narrative   Lives with husband in a one story home.  Has 2 children.  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  Education: high school.      Review of Systems:  CONSTITUTIONAL: No fevers, chills, night sweats, or weight loss.   EYES: No visual changes or eye pain ENT: No hearing changes.  No history of nose bleeds.   RESPIRATORY: No cough, wheezing and shortness of breath.   CARDIOVASCULAR: Negative for chest pain, and palpitations.   GI: Negative for abdominal discomfort, blood in stools or black stools.  No recent change in bowel habits.   GU:  No history of  incontinence.   MUSCLOSKELETAL: +history of joint pain +swelling.  +myalgias.   SKIN: Negative for lesions, rash, and itching.   HEMATOLOGY/ONCOLOGY: Negative for prolonged bleeding, bruising easily, and swollen nodes.  No history of cancer.   ENDOCRINE: Negative for cold or heat intolerance, polydipsia or goiter.   PSYCH:  No depression or anxiety symptoms.   NEURO: As Above.   Vital Signs:  BP 128/80   Pulse 82   Ht 5\' 6"  (1.676 m)   Wt 269 lb (122 kg)   SpO2 92%   BMI 43.42 kg/m   General Medical Exam:   General:  Well appearing, comfortable.   Eyes/ENT: see cranial nerve examination.   Neck: No masses appreciated.  Full range of motion without tenderness.  No carotid bruits. Respiratory:  Clear to auscultation, good air entry bilaterally.   Cardiac:  Regular rate and rhythm, + systolic murmur.   Extremities:  No deformities, edema, or skin discoloration.  Skin:  No rashes or lesions.  Neurological Exam: MENTAL STATUS including orientation to time, place, person, recent and remote memory, attention span and concentration, language, and fund of knowledge is normal.  Speech is not dysarthric.  CRANIAL NERVES: II:  No visual field defects.  Unremarkable fundi.   III-IV-VI: Pupils equal round and reactive to light.  Normal conjugate, extra-ocular eye movements in all directions of gaze.  No nystagmus.  No ptosis.   V:  Normal facial sensation.     VII:  Normal facial symmetry and movements.  VIII:  Normal hearing and vestibular function.   IX-X:  Normal palatal movement.   XI:  Normal shoulder shrug and head rotation.   XII:  Normal tongue strength and range of motion, no deviation or fasciculation.  MOTOR:  No atrophy, fasciculations or abnormal movements.  No pronator drift.  Tone is normal.   She is tender to palpation over the hamstring tendons bilaterally.    Right Upper Extremity:    Left Upper Extremity:    Deltoid  5/5   Deltoid  5/5   Biceps  5/5   Biceps  5/5     Triceps  5/5   Triceps  5/5   Wrist extensors  5/5   Wrist extensors  5/5   Wrist flexors  5/5   Wrist flexors  5/5   Finger extensors  5/5   Finger extensors  5/5   Finger flexors  5/5   Finger flexors  5/5   Dorsal interossei  5/5   Dorsal interossei  5/5   Abductor pollicis  5/5   Abductor pollicis  5/5   Tone (Ashworth scale)  0  Tone (Ashworth scale)  0   Right Lower Extremity:    Left Lower Extremity:    Hip flexors  5/5   Hip flexors  5/5   Hip extensors  5/5  Hip extensors  5/5   Knee flexors  5/5   Knee flexors  5/5   Knee extensors  5/5   Knee extensors  5/5   Dorsiflexors  5/5   Dorsiflexors  5/5   Plantarflexors  5/5   Plantarflexors  5/5   Toe extensors  5/5   Toe extensors  5/5   Toe flexors  5/5   Toe flexors  5/5   Tone (Ashworth scale)  0  Tone (Ashworth scale)  0   MSRs:  Right                                                                 Left brachioradialis 2+  brachioradialis 2+  biceps 2+  biceps 2+  triceps 2+  triceps 2+  patellar 2+  patellar 2+  ankle jerk 1+  ankle jerk 2+  Hoffman no  Hoffman no  plantar response down  plantar response down   SENSORY:  Reduced vibration and temperature distally in the right foot only.  Pin prick is intact.  Sensation elsewhere is intact.   Normal and symmetric perception of light touch, pinprick, vibration, and proprioception.  Romberg's sign absent.   COORDINATION/GAIT: Normal finger-to- nose-finger and heel-to-shin.  Intact rapid alternating movements bilaterally.  Able to rise from a chair without using arms.  Gait is mildly antalgic and wide-based, unassisted.  IMPRESSION: Bilateral posterior distal thigh pain is more suggestive of musculoskeletal pain given the localized soreness to palpation and characteristic of burning/achy pain.  She does have numbness of the R foot, but these two symptoms seem independent of each other.  I will set her up for NCS/EMG of the legs to evaluation for radiculopathy or  neuropathy, but my overall suspicion for primary nerve pathology causing her localized thigh pain is low.  I will seek the opinion of Dr. Tamala Julian with Sports Medicine to see if this is more of a hamstring tendonitis, however, unusual to involve both legs.  She was unable to undergo open MRI because of claustrophobia, but if her work-up is unrevealing, may need to consider CT lumbar spine or MRI under sedation. She is not keen on taking medications by mouth so offered a trial of diclofenac ointment to her legs as well as OTC options.      Thank you for allowing me to participate in patient's care.  If I can answer any additional questions, I would be pleased to do so.    Sincerely,    Knox Holdman K. Posey Pronto, DO

## 2017-03-30 ENCOUNTER — Ambulatory Visit (INDEPENDENT_AMBULATORY_CARE_PROVIDER_SITE_OTHER): Payer: Medicare Other | Admitting: Neurology

## 2017-03-30 DIAGNOSIS — M79604 Pain in right leg: Secondary | ICD-10-CM | POA: Diagnosis not present

## 2017-03-30 DIAGNOSIS — R200 Anesthesia of skin: Secondary | ICD-10-CM | POA: Diagnosis not present

## 2017-03-30 DIAGNOSIS — S76399S Other specified injury of muscle, fascia and tendon of the posterior muscle group at thigh level, unspecified thigh, sequela: Secondary | ICD-10-CM

## 2017-03-30 DIAGNOSIS — S73199S Other sprain of unspecified hip, sequela: Secondary | ICD-10-CM | POA: Diagnosis not present

## 2017-03-30 DIAGNOSIS — M79605 Pain in left leg: Secondary | ICD-10-CM

## 2017-03-30 DIAGNOSIS — IMO0001 Reserved for inherently not codable concepts without codable children: Secondary | ICD-10-CM

## 2017-03-30 NOTE — Procedures (Signed)
[facility]  [address]  [place], [place] [address] Tel: [phone] Fax:  [phone] Test Date:  03/30/2017  Patient: Elizabeth [name]  DOB: 08-Jan-1946 Physician: Narda Amber, DO  Sex: Female Height: 5\' 6"  Ref Phys: Narda Amber, DO  ID#: [id number] Temp: 32.5C Technician:    Patient Complaints: This is 72 year old female referred for evaluation of bilateral leg pain and right foot numbness.  NCV & EMG Findings: Extensive electrodiagnostic testing of the right lower extremity and additional studies of the left shows:  1. Bilateral sural and superficial peroneal sensory responses are within normal limits. 2. Bilateral peroneal motor responses are within normal limits. Bilateral tibial motor responses show mildly reduced amplitude; in isolation, these findings are nonspecific and most likely due to localized compression. 3. Bilateral tibial H reflex studies are very mildly prolonged. 4. There is no evidence of active or chronic motor axon loss changes affecting any of the tested muscles. Motor unit configuration and recruitment pattern is within normal limits.  Impression: This is a normal study. There is no evidence of a lumbosacral radiculopathy or sensorimotor polyneuropathy affecting the lower extremities.   ___________________________ Narda Amber, DO    Nerve Conduction Studies Anti Sensory Summary Table   Site NR Peak (ms) Norm Peak (ms) P-T Amp (V) Norm P-T Amp  Left Sup Peroneal Anti Sensory (Ant Lat Mall)  32.5C  12 cm    3.2 <4.6 5.4 >3  Right Sup Peroneal Anti Sensory (Ant Lat Mall)  12 cm    3.3 <4.6 7.8 >3  Left Sural Anti Sensory (Lat Mall)  32.5C  Calf    4.0 <4.6 6.4 >3  Right Sural Anti Sensory (Lat Mall)  32.5C  Calf    3.5 <4.6 7.1 >3   Motor Summary Table   Site NR Onset (ms) Norm Onset (ms) O-P Amp (mV) Norm O-P Amp Site1 Site2 Delta-0 (ms) Dist (cm) Vel (m/s) Norm Vel (m/s)  Left Peroneal Motor (Ext Dig Brev)  32.5C    Ankle    3.8 <6.0 3.7 >2.5 B Fib Ankle 8.1 37.0 46 >40  B Fib    11.9  3.4  Poplt B Fib 1.7 8.0 47 >40  Poplt    13.6  3.4         Right Peroneal Motor (Ext Dig Brev)  32.5C  Ankle    3.3 <6.0 4.6 >2.5 B Fib Ankle 8.3 37.0 45 >40  B Fib    11.6  4.0  Poplt B Fib 1.4 8.0 57 >40  Poplt    13.0  3.9         Left Tibial Motor (Abd Hall Brev)  32.5C  Ankle    5.1 <6.0 2.5 >4 Knee Ankle 9.5 38.0 40 >40  Knee    14.6  1.4         Right Tibial Motor (Abd Hall Brev)  32.5C  Ankle    5.0 <6.0 2.5 >4 Knee Ankle 9.1 37.0 41 >40  Knee    14.1  1.2          H Reflex Studies   NR H-Lat (ms) Lat Norm (ms) L-R H-Lat (ms)  Left Tibial (Gastroc)  32.5C     37.55 <35 0.00  Right Tibial (Gastroc)  32.5C     37.55 <35 0.00   EMG   Side Muscle Ins Act Fibs Psw Fasc Number Recrt Dur Dur. Amp Amp. Poly Poly. Comment  Right AntTibialis Nml Nml Nml Nml Nml Nml  Nml Nml Nml Nml Nml Nml N/A  Right Gastroc Nml Nml Nml Nml Nml Nml Nml Nml Nml Nml Nml Nml N/A  Right Flex Dig Long Nml Nml Nml Nml Nml Nml Nml Nml Nml Nml Nml Nml N/A  Right RectFemoris Nml Nml Nml Nml Nml Nml Nml Nml Nml Nml Nml Nml N/A  Right GluteusMed Nml Nml Nml Nml Nml Nml Nml Nml Nml Nml Nml Nml N/A  Right BicepsFemS Nml Nml Nml Nml Nml Nml Nml Nml Nml Nml Nml Nml N/A  Left AntTibialis Nml Nml Nml Nml Nml Nml Nml Nml Nml Nml Nml Nml N/A  Left Gastroc Nml Nml Nml Nml Nml Nml Nml Nml Nml Nml Nml Nml N/A  Left Flex Dig Long Nml Nml Nml Nml Nml Nml Nml Nml Nml Nml Nml Nml N/A  Left RectFemoris Nml Nml Nml Nml Nml Nml Nml Nml Nml Nml Nml Nml N/A  Left GluteusMed Nml Nml Nml Nml Nml Nml Nml Nml Nml Nml Nml Nml N/A  Left BicepsFemS Nml Nml Nml Nml Nml Nml Nml Nml Nml Nml Nml Nml N/A      Waveforms:

## 2017-03-31 ENCOUNTER — Telehealth: Payer: Self-pay | Admitting: *Deleted

## 2017-03-31 NOTE — Telephone Encounter (Signed)
-----   Message from Alda Berthold, DO sent at 03/30/2017  2:28 PM EST ----- Please inform patient that her nerve and muscle testing was normal and her discomfort does not seem to be stemming from a primary nerve problem.  She is scheduled to see Sports Medicine next week. Thanks.

## 2017-03-31 NOTE — Telephone Encounter (Signed)
Patient given results and instructions.   

## 2017-04-05 ENCOUNTER — Ambulatory Visit: Payer: Medicare Other | Admitting: Family Medicine

## 2017-04-05 ENCOUNTER — Encounter: Payer: Self-pay | Admitting: Family Medicine

## 2017-04-05 DIAGNOSIS — M79605 Pain in left leg: Secondary | ICD-10-CM | POA: Diagnosis not present

## 2017-04-05 DIAGNOSIS — M4807 Spinal stenosis, lumbosacral region: Secondary | ICD-10-CM

## 2017-04-05 DIAGNOSIS — M79604 Pain in right leg: Secondary | ICD-10-CM | POA: Diagnosis not present

## 2017-04-05 MED ORDER — METHYLPREDNISOLONE ACETATE 80 MG/ML IJ SUSP
80.0000 mg | Freq: Once | INTRAMUSCULAR | Status: AC
  Administered 2017-04-05: 80 mg via INTRAMUSCULAR

## 2017-04-05 MED ORDER — KETOROLAC TROMETHAMINE 60 MG/2ML IM SOLN
60.0000 mg | Freq: Once | INTRAMUSCULAR | Status: AC
  Administered 2017-04-05: 60 mg via INTRAMUSCULAR

## 2017-04-05 NOTE — Assessment & Plan Note (Signed)
Patient has bilateral leg pain.  I do believe though that this is likely secondary to more of a spinal stenosis.  Patient did have an EMG that did not show any chronic radicular symptoms that are back concerning.  Patient was having severe pain.  Patient has had x-rays from an outside facility and she was told that she does have degenerative disc disease.  We will attempt to get this.  Patient is claustrophobic and is unable to have an MRI.  I do believe that a CT myelogram could be beneficial and will help [place] with with diagnosis.  Patient could be a candidate for an epidural injection with her not on any type of blood thinner at this time.  We discussed icing regimen, topical anti-inflammatories given to try to help with a little bit of discomfort.  Toradol and Depo-Medrol given today as well.  We discussed if worsening symptoms to seek medical attention immediately.  Differential includes possible vascular compromise but I do not see any signs on exam today.  Possible ABI would be necessary.  May also want to consider CT abdomen and pelvis if continuing not to find anything of great value.  Depending on imaging will discuss further about follow-up.

## 2017-04-05 NOTE — Progress Notes (Signed)
[facility] [address] [place], [place] [address] Phone: [phone] Subjective:    I'm seeing this patient by the request  of:  Patel DO   CC: Bilateral leg pain  GMW:[id number]  Elizabeth [name] is a 72 y.o. female coming in with complaint of bilateral leg pain.  Has been going on for quite some time.  States that he has had difficulty since a fall in October 2015.  [place] due to like it happened because of left lateral leg pain.  Patient states since then she has intermittent sharp burning and sore sensation and seems to be worse with increasing activity.  Seems to be located over the thigh and buttocks area and goes down to the posterior aspect of the knees bilaterally.  Feels that her hamstrings are significantly sore and tender as well.  Denies any weakness.  Patient has been on multiple different medications over the course of time.  Has even attempted physical therapy as well as TENS unit with very mild benefit.  Patient has been unable to get advanced imaging secondary to claustrophobia.  States she doesn't want anymore pain pills. States she went on vacation and when she came back and felt the pain. When she stands up to walk she feels like something is "ripping her legs apart". Last Sunday she fell trying to get up out a chair. Says she could not walk. Has been taking a lot of Ibuprofen (doesn't help). Has had injections that did not work. She hurts every day. She's made ADL changes at home. Posterior leg pain. Right foot on the lateral side is numb.  Seems to be more constant.   Patient did have an EMG done on March 30, 2017 that showed no neurologic deficit was constant Patient states that she has had significant amount of laboratory workup as well and nothing has really been found.  Past Medical History:  Diagnosis Date  . Aortic valve disorder   . Benign hypertension with CKD (chronic kidney disease), stage II   . Edema   . GERD (gastroesophageal  reflux disease)   . Hyperuricemia   . Lumbar stenosis   . Osteoarthrosis   . Vitamin D deficiency    Past Surgical History:  Procedure Laterality Date  . ABDOMINAL HYSTERECTOMY    . GALLBLADDER SURGERY     Social History   Socioeconomic History  . Marital status: Married    Spouse name: None  . Number of children: 2  . Years of education: 47  . Highest education level: None  Social Needs  . Financial resource strain: None  . Food insecurity - worry: None  . Food insecurity - inability: None  . Transportation needs - medical: None  . Transportation needs - non-medical: None  Occupational History  . Occupation: child care  Tobacco Use  . Smoking status: Never Smoker  . Smokeless tobacco: Never Used  Substance and Sexual Activity  . Alcohol use: No    Frequency: Never  . Drug use: No  . Sexual activity: None  Other Topics Concern  . None  Social History Narrative   Lives with husband in a one story home.  Has 2 children.  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  Education: high school.     Allergies  Allergen Reactions  . Hydrocodone   . Lipitor [Atorvastatin]   . Penicillins   . Prednisone    No family history on file.   Past medical history, social, surgical and  family history all reviewed in electronic medical record.  No pertanent information unless stated regarding to the chief complaint.   Review of Systems:Review of systems updated and as accurate as of 04/05/17  No headache, visual changes, nausea, vomiting, diarrhea, constipation, dizziness, abdominal pain, skin rash, fevers, chills, night sweats, weight loss, swollen lymph nodes, body aches, joint swelling,  chest pain, shortness of breath, mood changes.  Positive muscle aches  Objective  Blood pressure (!) 170/90, pulse 79, height 5\' 5"  (1.651 m), weight 263 lb (119.3 kg), SpO2 97 %. Systems examined below as of 04/05/17   General: No apparent distress alert and oriented x3 mood and affect normal, dressed  appropriately.  HEENT: Pupils equal, extraocular movements intact  Respiratory: Patient's speak in full sentences and does not appear short of breath  Cardiovascular: Trace lower extremity edema, non tender, no erythema  Skin: Warm dry intact with no signs of infection or rash on extremities or on axial skeleton.  Abdomen: Soft nontender obese Neuro: Cranial nerves II through XII are intact, neurovascularly intact in all extremities with 2+ DTRs and 2+ pulses.  Lymph: No lymphadenopathy of posterior or anterior cervical chain or axillae bilaterally.  Gait mild broad-based and antalgic MSK: Mild tender with full range of motion and good stability and symmetric strength and tone of shoulders, elbows, wrist, hip, knee and ankles bilaterally.  Changes of multiple joints Tender to palpation over the proximal cath.  Straight leg test does elicit cramping of the legs bilaterally.  Minimal discomfort in the lumbar spine with loss of lordosis and some degenerative scoliosis but minimal pain to palpation.    Impression and Recommendations:     This case required medical decision making of moderate complexity.      Note: This dictation was prepared with Dragon dictation along with smaller phrase technology. Any transcriptional errors that result from this process are unintentional.

## 2017-04-05 NOTE — Patient Instructions (Addendum)
Good to see you  CT myelogram of the back will be order and see what we can find  IN the meantime I would get  Vitamin D 2000 IU daily  Turmeric 500mg  daily  Tart cherry extract any dose at night I think you will likely have some type of spinal stenosis and we will see what we can find.  After the CT scan we will see if we can order an injection to help with the pain as well IF we do the injection then I want to see you again 2 weeks after and see how you are doing

## 2017-04-12 ENCOUNTER — Other Ambulatory Visit: Payer: Medicare Other

## 2017-04-12 ENCOUNTER — Inpatient Hospital Stay: Admission: RE | Admit: 2017-04-12 | Payer: Medicare Other | Source: Ambulatory Visit

## 2017-04-12 ENCOUNTER — Ambulatory Visit
Admission: RE | Admit: 2017-04-12 | Discharge: 2017-04-12 | Disposition: A | Discharge status: Home / Self Care | Payer: Medicare Other | Source: Ambulatory Visit | Attending: Family Medicine | Admitting: Family Medicine

## 2017-04-12 ENCOUNTER — Other Ambulatory Visit: Payer: Self-pay | Admitting: *Deleted

## 2017-04-12 DIAGNOSIS — M4807 Spinal stenosis, lumbosacral region: Secondary | ICD-10-CM

## 2017-04-12 DIAGNOSIS — M545 Low back pain: Secondary | ICD-10-CM | POA: Diagnosis not present

## 2017-04-12 IMAGING — CT CT L SPINE W/O CM
4 of 9 series · 13 of 33 positions shown, 15 images · non-contrast
Comparison: None.

CLINICAL DATA: Low back pain and bilateral leg pain for 15 months.
Evaluate spinal stenosis. No acute injury or prior relevant surgery.

EXAM:
CT LUMBAR SPINE WITHOUT CONTRAST
TECHNIQUE: Multidetector CT imaging of the lumbar spine was performed without
intravenous contrast administration. Multiplanar CT image
reconstructions were also generated.

[Series 4: l spine bone · axial · 0.27mm/px · z∈[-230,-157]mm · 2 of 88 slices shown]
[im 30/88  bone]
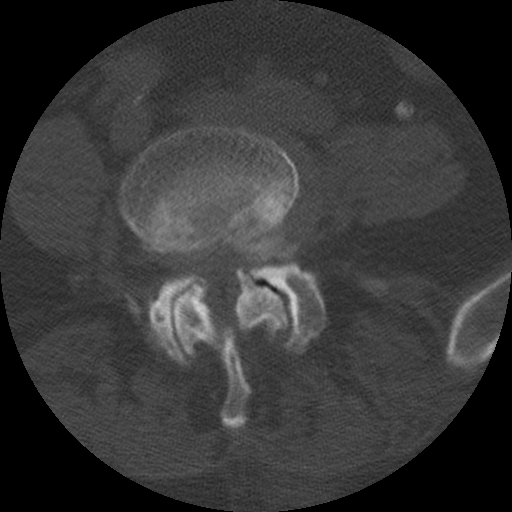
[im 59/88  bone]
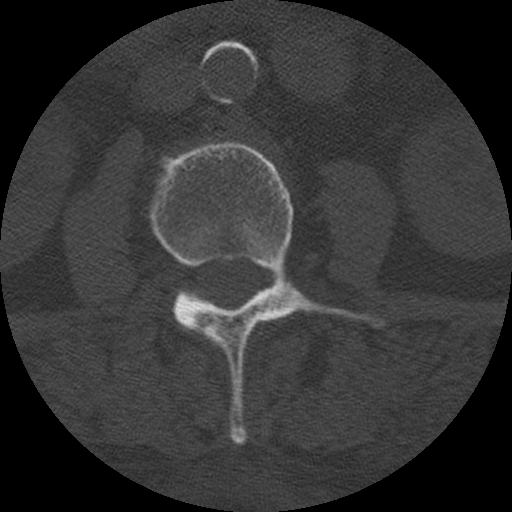

[Series 5: l-spine detail · axial · 0.27mm/px · z∈[-250,-140]mm · 3 of 88 slices shown, 4 images]
[im 22/88  soft-tissue]
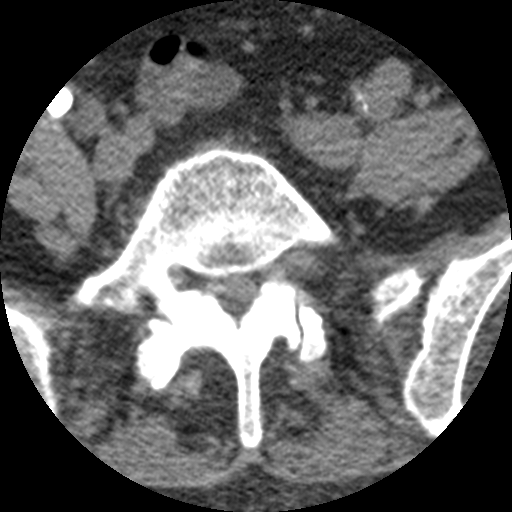
[im 22/88  bone]
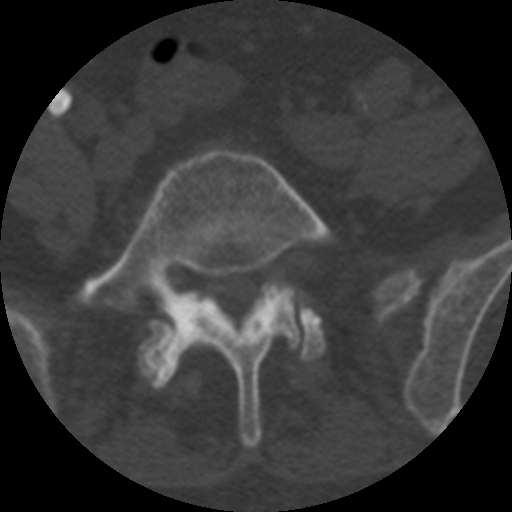
[im 44/88  bone]
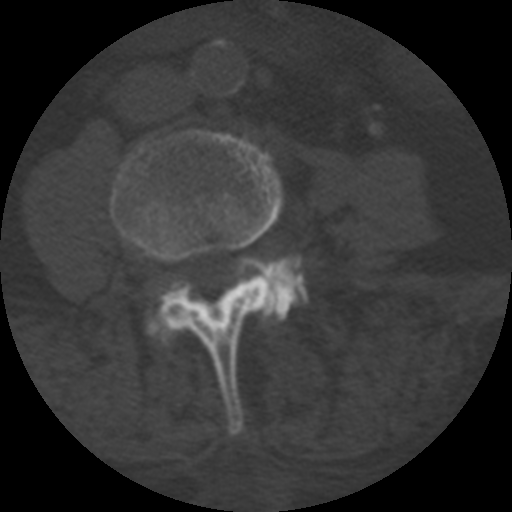
[im 66/88  bone]
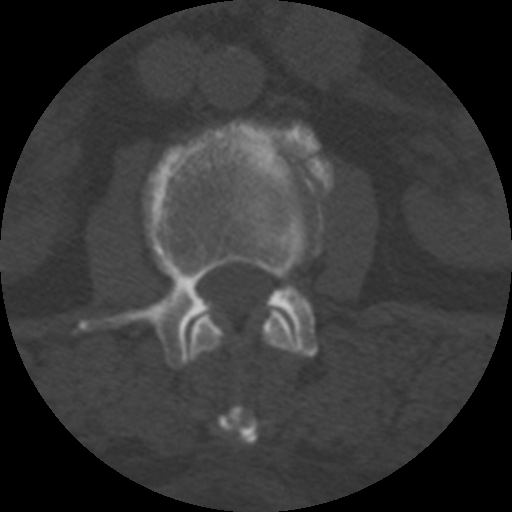

[Series 200: cor · coronal · 0.44mm/px · 5 of 35 slices shown, 6 images]
[im 12/35  bone]
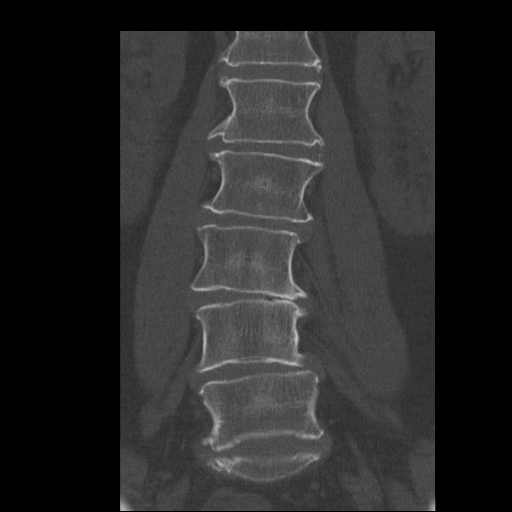
[im 15/35  bone]
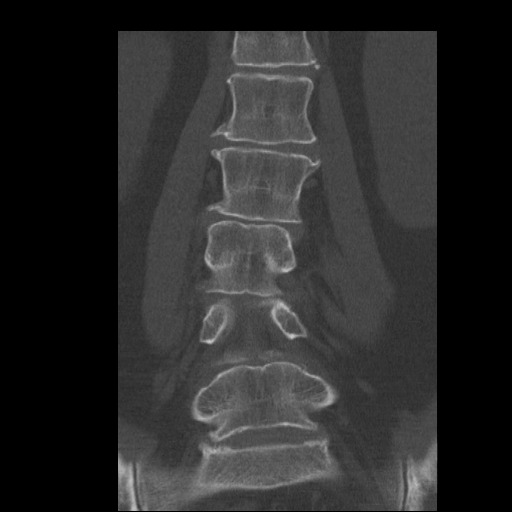
[im 18/35  soft-tissue]
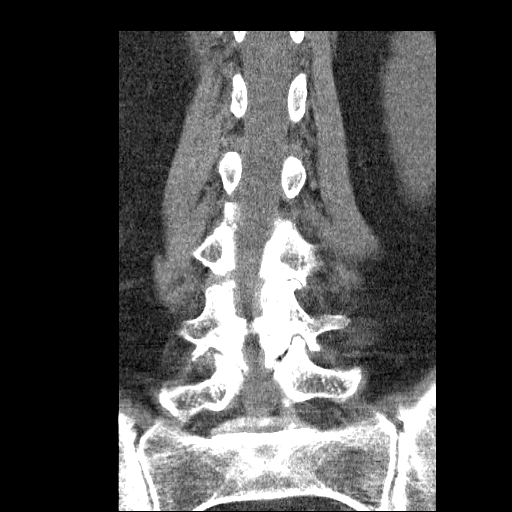
[im 18/35  bone]
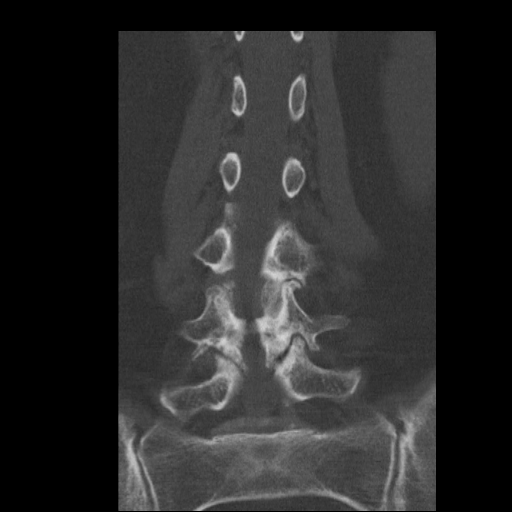
[im 20/35  bone]
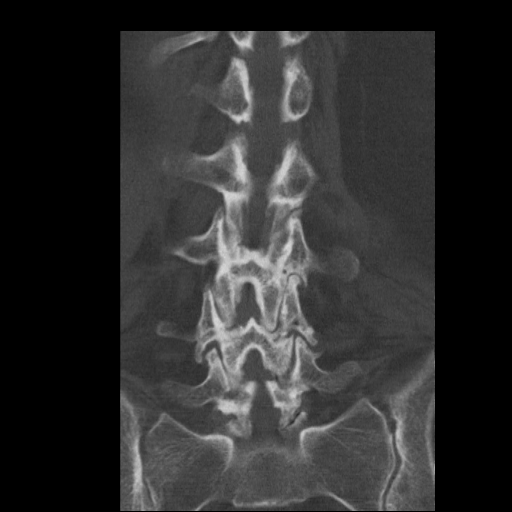
[im 23/35  bone]
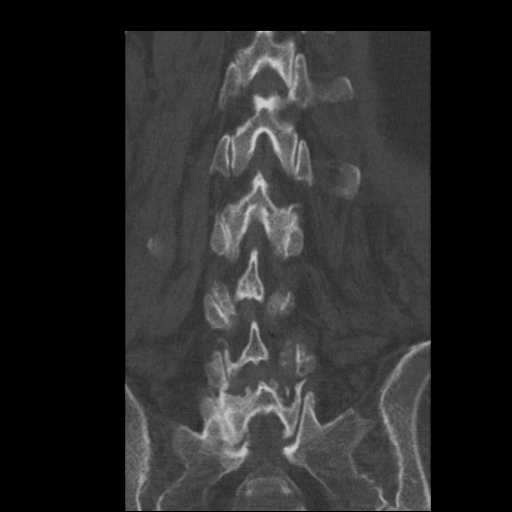

[Series 201: sag · sagittal · 0.44mm/px · 3 of 65 slices shown]
[im 13/65  bone]
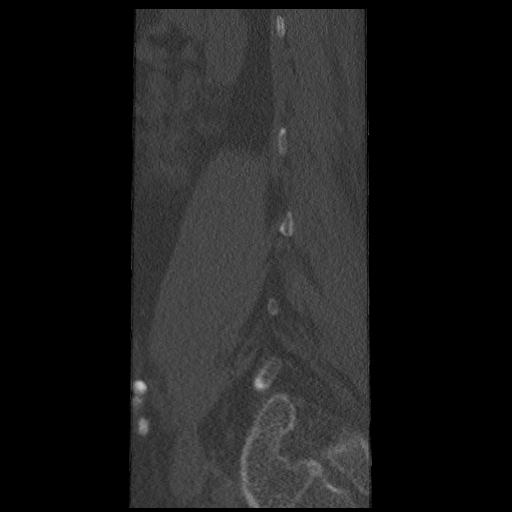
[im 26/65  bone]
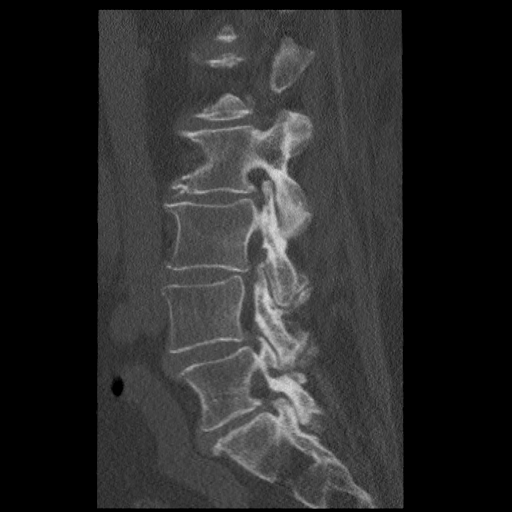
[im 39/65  bone]
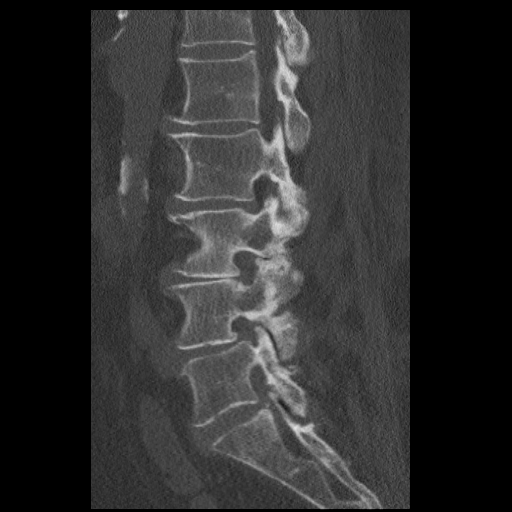

[13 of 33 positions shown; findings below may reference images not displayed]

FINDINGS: Segmentation: 5 lumbar type vertebral bodies.

Alignment: There is a mild convex right scoliosis. There is 2 mm of
degenerative anterolisthesis at L4-5.

Vertebrae: No evidence of acute fracture, pars defect or focal
osseous lesion. The spinal canal is relatively small on a congenital
basis. Moderate facet degenerative changes are present inferiorly.

Paraspinal and other soft tissues: The paraspinal soft tissues
appear normal. There is aortic and branch vessel atherosclerosis.
There is a 5 mm calculus in the upper pole of the right kidney. No
evidence of hydronephrosis. Multiple paraspinal calcifications
likely phleboliths within the gonadal veins.

Disc levels:

L1-2: Loss of disc height with annular disc bulging and endplate
osteophytes asymmetric to the right. No significant spinal stenosis
or nerve root encroachment.

L2-3: Loss of disc height with annular disc bulging and endplate
osteophytes asymmetric to the right. Mild facet and ligamentous
hypertrophy. These factors contribute to moderate spinal stenosis
with asymmetric narrowing of the right lateral recess. The right
foramen is also mildly narrowed without definite L2 nerve root
encroachment.

L3-4: Mild loss of disc height with annular disc bulging and
endplate osteophytes asymmetric to the left. Moderate facet and
ligamentous hypertrophy contributing to moderate multifactorial
spinal stenosis. Both lateral recesses and the left foramen are
mildly narrowed.

L4-5: Severe multifactorial spinal stenosis secondary to loss of
disc height with annular disc bulging eccentric to the left and
advanced facet and ligamentous hypertrophy, also worse on the left.
Both lateral recesses are narrowed and there is moderate foraminal
narrowing on the left.

L5-S1: Disc bulging and endplate osteophytes are more symmetric at
this level. There is advanced bilateral facet hypertrophy. Mild
spinal stenosis and mild narrowing of the lateral recesses. The
foramina are sufficiently patent.
IMPRESSION: 1. Multilevel spondylosis superimposed on a congenitally small
canal.
2. Resulting severe multifactorial spinal stenosis at L4-5. Moderate
spinal stenosis at L2-3 and L3-4.
3. Variable narrowing of the lateral recesses and foramina as
detailed above. Foraminal narrowing appears worst on the left at
L4-5.
4. 5 mm right renal calculus.  Aortic Atherosclerosis ([66]-[66]).

## 2017-04-13 ENCOUNTER — Other Ambulatory Visit: Payer: Self-pay

## 2017-04-13 DIAGNOSIS — M4800 Spinal stenosis, site unspecified: Secondary | ICD-10-CM

## 2017-04-13 NOTE — Progress Notes (Signed)
Sent referral to neurosurgery

## 2017-04-21 DIAGNOSIS — M48062 Spinal stenosis, lumbar region with neurogenic claudication: Secondary | ICD-10-CM | POA: Diagnosis not present

## 2017-04-21 DIAGNOSIS — I10 Essential (primary) hypertension: Secondary | ICD-10-CM | POA: Diagnosis not present

## 2017-04-21 DIAGNOSIS — R030 Elevated blood-pressure reading, without diagnosis of hypertension: Secondary | ICD-10-CM | POA: Diagnosis not present

## 2017-05-05 ENCOUNTER — Other Ambulatory Visit: Payer: Self-pay | Admitting: Neurosurgery

## 2017-05-08 DIAGNOSIS — M7500 Adhesive capsulitis of unspecified shoulder: Secondary | ICD-10-CM | POA: Diagnosis not present

## 2017-05-08 DIAGNOSIS — M48061 Spinal stenosis, lumbar region without neurogenic claudication: Secondary | ICD-10-CM | POA: Diagnosis not present

## 2017-06-02 NOTE — Pre-Procedure Instructions (Signed)
Elizabeth [name]  06/02/2017      [facility] [place], [place] AT [address] [place] [address] Phone: [phone] Fax: [phone]    Your procedure is scheduled on June 12, 2017.  Report to [facility] Admitting at 530 AM.  Call this number if you have problems the morning of surgery:  [phone]   Remember:  Do not eat food or drink liquids after midnight.  Take these medicines the morning of surgery with A SIP OF WATER omeprazole (prilosec), oxycodone-acetaminophen (percocet)-if needed for pain   7 days prior to surgery STOP taking any Aspirin (unless otherwise instructed by your surgeon), Aleve, Naproxen, Ibuprofen, Motrin, Advil, Goody's, BC's, all herbal medications, fish oil, and all vitamins  Continue all other medications as instructed by your physician except follow the above medication instructions before surgery   Do not wear jewelry, make-up or nail polish.  Do not wear lotions, powders, or perfumes, or deodorant.  Do not shave 48 hours prior to surgery.    Do not bring valuables to the hospital.  [facility] is not responsible for any belongings or valuables.  Contacts, dentures or bridgework may not be worn into surgery.  Leave your suitcase in the car.  After surgery it may be brought to your room.  For patients admitted to the hospital, discharge time will be determined by your treatment team.  Patients discharged the day of surgery will not be allowed to drive home.   Many- Preparing For Surgery  Before surgery, you can play an important role. Because skin is not sterile, your skin needs to be as free of germs as possible. You can reduce the number of germs on your skin by washing with CHG (chlorahexidine gluconate) Soap before surgery.  CHG is an antiseptic cleaner which kills germs and bonds with the skin to continue killing germs even after  washing.  Please do not use if you have an allergy to CHG or antibacterial soaps. If your skin becomes reddened/irritated stop using the CHG.  Do not shave (including legs and underarms) for at least 48 hours prior to first CHG shower. It is OK to shave your face.  Please follow these instructions carefully.   1. Shower the NIGHT BEFORE SURGERY and the MORNING OF SURGERY with CHG.   2. If you chose to wash your hair, wash your hair first as usual with your normal shampoo.  3. After you shampoo, rinse your hair and body thoroughly to remove the shampoo.  4. Use CHG as you would any other liquid soap. You can apply CHG directly to the skin and wash gently with a scrungie or a clean washcloth.   5. Apply the CHG Soap to your body ONLY FROM THE NECK DOWN.  Do not use on open wounds or open sores. Avoid contact with your eyes, ears, mouth and genitals (private parts). Wash Face and genitals (private parts)  with your normal soap.  6. Wash thoroughly, paying special attention to the area where your surgery will be performed.  7. Thoroughly rinse your body with warm water from the neck down.  8. DO NOT shower/wash with your normal soap after using and rinsing off the CHG Soap.  9. Pat yourself dry with a CLEAN TOWEL.  10. Wear CLEAN PAJAMAS to bed the night before surgery, wear comfortable clothes the morning of surgery  11. Place  CLEAN SHEETS on your bed the night of your first shower and DO NOT SLEEP WITH PETS.  Day of Surgery: Do not apply any deodorants/lotions. Please wear clean clothes to the hospital/surgery center.    Please read over the following fact sheets that you were given. Pain Booklet, Coughing and Deep Breathing, MRSA Information and Surgical Site Infection Prevention

## 2017-06-05 ENCOUNTER — Encounter (HOSPITAL_COMMUNITY): Payer: Self-pay

## 2017-06-05 ENCOUNTER — Encounter (HOSPITAL_COMMUNITY)
Admission: RE | Admit: 2017-06-05 | Discharge: 2017-06-05 | Disposition: A | Discharge status: Home / Self Care | Payer: Medicare Other | Source: Ambulatory Visit | Attending: Neurosurgery | Admitting: Neurosurgery

## 2017-06-05 ENCOUNTER — Other Ambulatory Visit (HOSPITAL_COMMUNITY): Payer: Medicare Other

## 2017-06-05 DIAGNOSIS — Z01810 Encounter for preprocedural cardiovascular examination: Secondary | ICD-10-CM | POA: Diagnosis not present

## 2017-06-05 DIAGNOSIS — M48062 Spinal stenosis, lumbar region with neurogenic claudication: Secondary | ICD-10-CM | POA: Insufficient documentation

## 2017-06-05 DIAGNOSIS — I10 Essential (primary) hypertension: Secondary | ICD-10-CM | POA: Insufficient documentation

## 2017-06-05 DIAGNOSIS — Z01812 Encounter for preprocedural laboratory examination: Secondary | ICD-10-CM | POA: Insufficient documentation

## 2017-06-05 HISTORY — DX: Anxiety disorder, unspecified: F41.9

## 2017-06-05 HISTORY — DX: Claustrophobia: F40.240

## 2017-06-05 HISTORY — DX: Cardiac murmur, unspecified: R01.1

## 2017-06-05 LAB — CBC
HCT: 39.7 % (ref 36.0–46.0)
Hemoglobin: 13.1 g/dL (ref 12.0–15.0)
MCH: 29.7 pg (ref 26.0–34.0)
MCHC: 33 g/dL (ref 30.0–36.0)
MCV: 90 fL (ref 78.0–100.0)
PLATELETS: 180 10*3/uL (ref 150–400)
RBC: 4.41 MIL/uL (ref 3.87–5.11)
RDW: 12.8 % (ref 11.5–15.5)
WBC: 7.5 10*3/uL (ref 4.0–10.5)

## 2017-06-05 LAB — BASIC METABOLIC PANEL
Anion gap: 8 (ref 5–15)
BUN: 18 mg/dL (ref 6–20)
CALCIUM: 9.3 mg/dL (ref 8.9–10.3)
CO2: 23 mmol/L (ref 22–32)
CREATININE: 0.8 mg/dL (ref 0.44–1.00)
Chloride: 112 mmol/L — ABNORMAL HIGH (ref 101–111)
Glucose, Bld: 81 mg/dL (ref 65–99)
Potassium: 4.1 mmol/L (ref 3.5–5.1)
SODIUM: 143 mmol/L (ref 135–145)

## 2017-06-05 LAB — SURGICAL PCR SCREEN
MRSA, PCR: NEGATIVE
Staphylococcus aureus: NEGATIVE

## 2017-06-05 MED ORDER — CHLORHEXIDINE GLUCONATE CLOTH 2 % EX PADS: 6.0000 | MEDICATED_PAD | Freq: Once | CUTANEOUS | Status: DC

## 2017-06-09 MED ORDER — SODIUM CHLORIDE 0.9 % IV SOLN
1500.0000 mg | INTRAVENOUS | Status: AC
  Administered 2017-06-12: 1500 mg via INTRAVENOUS
  Filled 2017-06-09: qty 1500

## 2017-06-12 ENCOUNTER — Other Ambulatory Visit: Payer: Self-pay

## 2017-06-12 ENCOUNTER — Ambulatory Visit (HOSPITAL_COMMUNITY): Payer: Medicare Other

## 2017-06-12 ENCOUNTER — Ambulatory Visit (HOSPITAL_COMMUNITY): Payer: Medicare Other | Admitting: Anesthesiology

## 2017-06-12 ENCOUNTER — Encounter (HOSPITAL_COMMUNITY): Payer: Self-pay | Admitting: *Deleted

## 2017-06-12 ENCOUNTER — Inpatient Hospital Stay (HOSPITAL_COMMUNITY)
Admission: EM | Disposition: A | Discharge status: Home / Self Care | Payer: Self-pay | Source: Ambulatory Visit | Attending: Neurosurgery

## 2017-06-12 ENCOUNTER — Inpatient Hospital Stay (HOSPITAL_COMMUNITY)
Admission: EM | Admit: 2017-06-12 | Discharge: 2017-06-13 | DRG: 516 | Disposition: A | Discharge status: Home / Self Care | Payer: Medicare Other | Source: Ambulatory Visit | Attending: Neurosurgery | Admitting: Neurosurgery

## 2017-06-12 DIAGNOSIS — M48062 Spinal stenosis, lumbar region with neurogenic claudication: Secondary | ICD-10-CM | POA: Diagnosis not present

## 2017-06-12 DIAGNOSIS — K219 Gastro-esophageal reflux disease without esophagitis: Secondary | ICD-10-CM | POA: Diagnosis present

## 2017-06-12 DIAGNOSIS — Z981 Arthrodesis status: Secondary | ICD-10-CM | POA: Diagnosis not present

## 2017-06-12 DIAGNOSIS — M5416 Radiculopathy, lumbar region: Secondary | ICD-10-CM | POA: Diagnosis not present

## 2017-06-12 DIAGNOSIS — Z6841 Body Mass Index (BMI) 40.0 and over, adult: Secondary | ICD-10-CM

## 2017-06-12 DIAGNOSIS — Z419 Encounter for procedure for purposes other than remedying health state, unspecified: Secondary | ICD-10-CM

## 2017-06-12 DIAGNOSIS — F419 Anxiety disorder, unspecified: Secondary | ICD-10-CM | POA: Diagnosis present

## 2017-06-12 DIAGNOSIS — N182 Chronic kidney disease, stage 2 (mild): Secondary | ICD-10-CM | POA: Diagnosis not present

## 2017-06-12 DIAGNOSIS — M4316 Spondylolisthesis, lumbar region: Secondary | ICD-10-CM | POA: Diagnosis not present

## 2017-06-12 DIAGNOSIS — Z79899 Other long term (current) drug therapy: Secondary | ICD-10-CM | POA: Diagnosis not present

## 2017-06-12 DIAGNOSIS — I10 Essential (primary) hypertension: Secondary | ICD-10-CM | POA: Diagnosis not present

## 2017-06-12 DIAGNOSIS — I359 Nonrheumatic aortic valve disorder, unspecified: Secondary | ICD-10-CM | POA: Diagnosis not present

## 2017-06-12 DIAGNOSIS — I129 Hypertensive chronic kidney disease with stage 1 through stage 4 chronic kidney disease, or unspecified chronic kidney disease: Secondary | ICD-10-CM | POA: Diagnosis not present

## 2017-06-12 DIAGNOSIS — M545 Low back pain: Secondary | ICD-10-CM | POA: Diagnosis present

## 2017-06-12 HISTORY — PX: LUMBAR LAMINECTOMY/DECOMPRESSION MICRODISCECTOMY: SHX5026

## 2017-06-12 IMAGING — CR DG LUMBAR SPINE 1V
1 series · 1 of 1 positions shown · non-contrast
Comparison: Lumbar radiographs [DATE]. Lumbar spine CT
[DATE].

CLINICAL DATA: 71-year-old female undergoing lumbar surgery.

EXAM:
LUMBAR SPINE - 1 VIEW

[lateral]
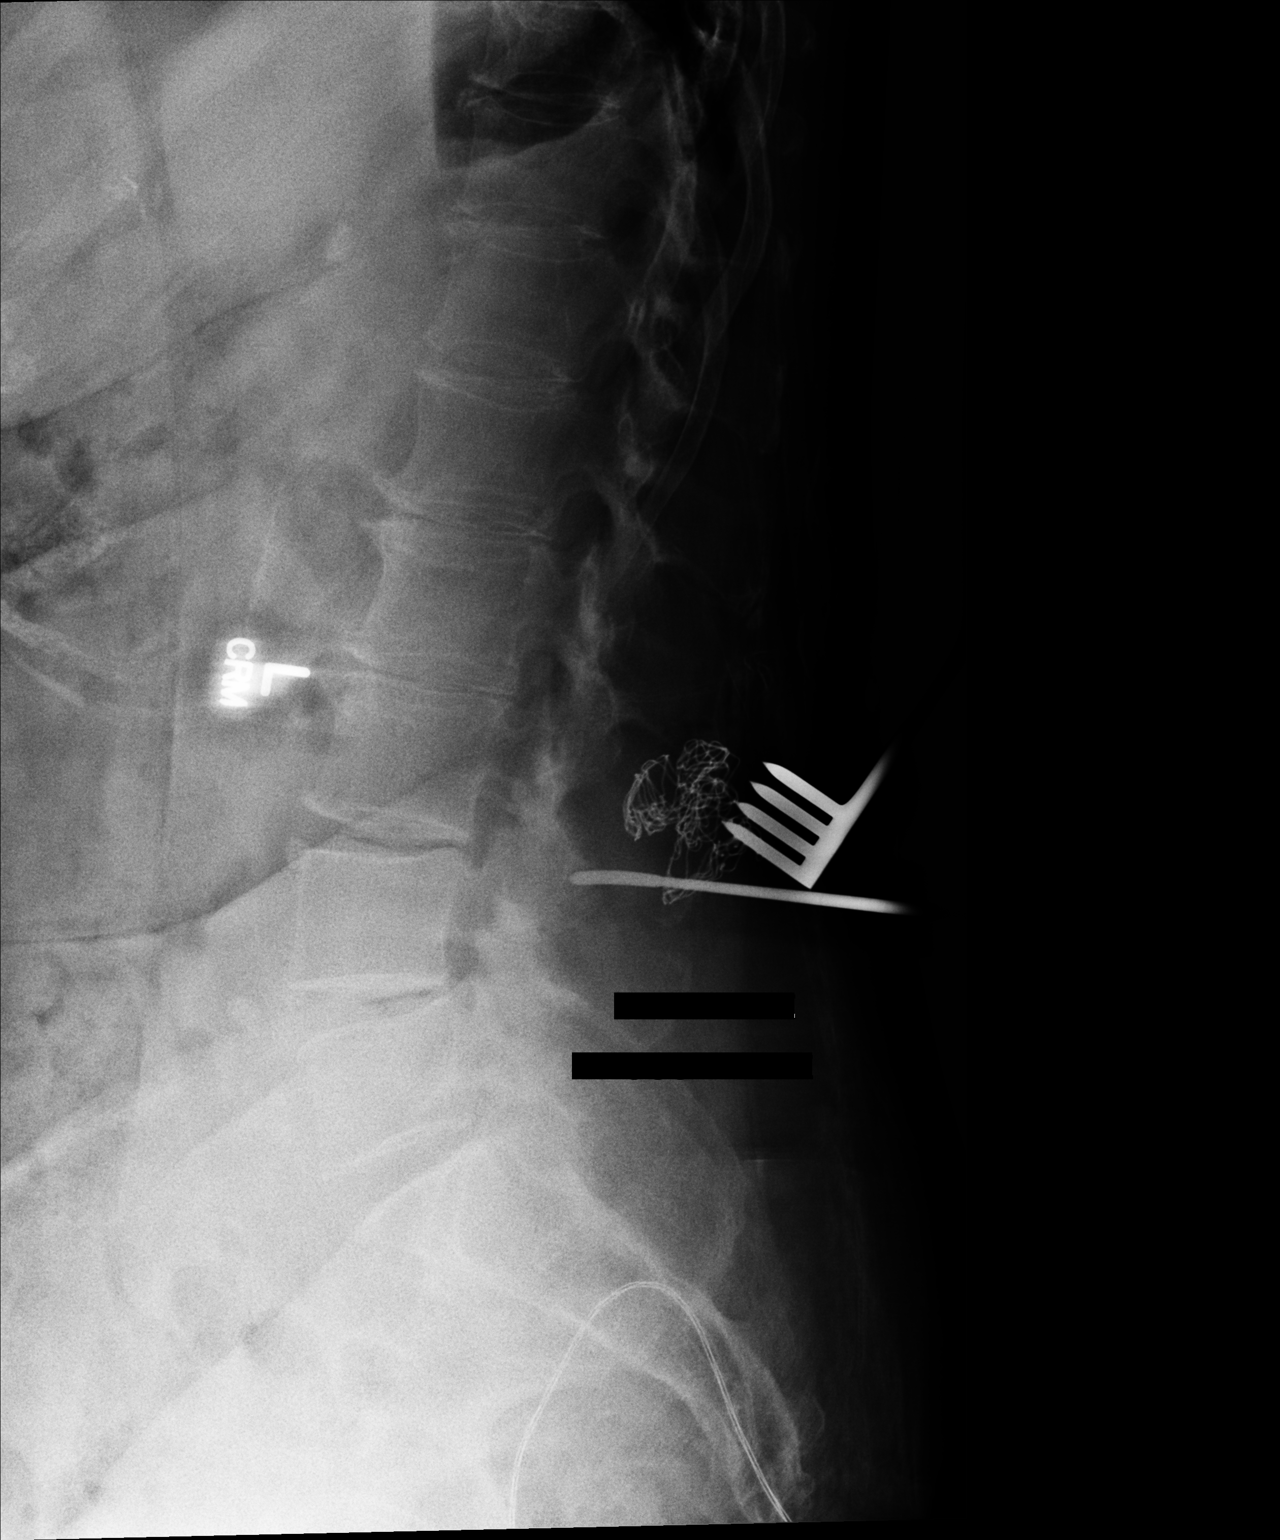

[1 of 1 positions shown; findings below may reference images not displayed]

FINDINGS: Hypoplastic right and absent left ribs at T12 with full size ribs at
T11 and normal lumbar segmentation. This is the same numbering
system used on [DATE].

Intraoperative portable cross-table lateral view of the lumbar spine
at [8F] hours.

A surgical probe is directed toward the L3-L4 disc level, projecting
over the inferior left L3 articulating facet.
IMPRESSION: Intraoperative localization at L3-L4.

## 2017-06-12 SURGERY — LUMBAR LAMINECTOMY/DECOMPRESSION MICRODISCECTOMY 2 LEVELS
Anesthesia: General | Site: Spine Lumbar

## 2017-06-12 MED ORDER — ARTIFICIAL TEARS OPHTHALMIC OINT
TOPICAL_OINTMENT | OPHTHALMIC | Status: DC | PRN
  Administered 2017-06-12: 1 via OPHTHALMIC

## 2017-06-12 MED ORDER — LACTATED RINGERS IV SOLN
INTRAVENOUS | Status: DC
  Administered 2017-06-12: 06:00:00 via INTRAVENOUS

## 2017-06-12 MED ORDER — HYDROMORPHONE HCL 1 MG/ML IJ SOLN
INTRAMUSCULAR | Status: AC
  Administered 2017-06-12: 0.5 mg via INTRAVENOUS
  Filled 2017-06-12: qty 1

## 2017-06-12 MED ORDER — MIDAZOLAM HCL 2 MG/2ML IJ SOLN
INTRAMUSCULAR | Status: AC
  Filled 2017-06-12: qty 2

## 2017-06-12 MED ORDER — 0.9 % SODIUM CHLORIDE (POUR BTL) OPTIME
TOPICAL | Status: DC | PRN
  Administered 2017-06-12: 1000 mL

## 2017-06-12 MED ORDER — DEXAMETHASONE SODIUM PHOSPHATE 10 MG/ML IJ SOLN
INTRAMUSCULAR | Status: AC
  Filled 2017-06-12: qty 1

## 2017-06-12 MED ORDER — PHENOL 1.4 % MT LIQD: 1.0000 | OROMUCOSAL | Status: DC | PRN

## 2017-06-12 MED ORDER — SODIUM CHLORIDE 0.9% FLUSH
3.0000 mL | Freq: Two times a day (BID) | INTRAVENOUS | Status: DC
  Administered 2017-06-13: 3 mL via INTRAVENOUS

## 2017-06-12 MED ORDER — PROPOFOL 10 MG/ML IV BOLUS
INTRAVENOUS | Status: AC
  Filled 2017-06-12: qty 20

## 2017-06-12 MED ORDER — ONDANSETRON HCL 4 MG/2ML IJ SOLN
INTRAMUSCULAR | Status: DC | PRN
  Administered 2017-06-12: 4 mg via INTRAVENOUS

## 2017-06-12 MED ORDER — ACETAMINOPHEN 650 MG RE SUPP: 650.0000 mg | RECTAL | Status: DC | PRN

## 2017-06-12 MED ORDER — FENTANYL CITRATE (PF) 250 MCG/5ML IJ SOLN
INTRAMUSCULAR | Status: AC
  Filled 2017-06-12: qty 5

## 2017-06-12 MED ORDER — BACITRACIN ZINC 500 UNIT/GM EX OINT
TOPICAL_OINTMENT | CUTANEOUS | Status: DC | PRN
  Administered 2017-06-12: 1 via TOPICAL

## 2017-06-12 MED ORDER — BUPIVACAINE-EPINEPHRINE (PF) 0.5% -1:200000 IJ SOLN
INTRAMUSCULAR | Status: DC | PRN
  Administered 2017-06-12 (×2): 10 mL

## 2017-06-12 MED ORDER — FENTANYL CITRATE (PF) 100 MCG/2ML IJ SOLN
INTRAMUSCULAR | Status: DC | PRN
  Administered 2017-06-12 (×2): 50 ug via INTRAVENOUS
  Administered 2017-06-12: 100 ug via INTRAVENOUS

## 2017-06-12 MED ORDER — DOCUSATE SODIUM 100 MG PO CAPS
100.0000 mg | ORAL_CAPSULE | Freq: Two times a day (BID) | ORAL | Status: DC
  Administered 2017-06-12: 100 mg via ORAL
  Filled 2017-06-12: qty 1

## 2017-06-12 MED ORDER — BUPIVACAINE-EPINEPHRINE (PF) 0.5% -1:200000 IJ SOLN
INTRAMUSCULAR | Status: AC
  Filled 2017-06-12: qty 30

## 2017-06-12 MED ORDER — THROMBIN (RECOMBINANT) 5000 UNITS EX SOLR
CUTANEOUS | Status: DC | PRN
  Administered 2017-06-12: 5 mL via TOPICAL

## 2017-06-12 MED ORDER — ROCURONIUM BROMIDE 100 MG/10ML IV SOLN
INTRAVENOUS | Status: DC | PRN
  Administered 2017-06-12: 50 mg via INTRAVENOUS

## 2017-06-12 MED ORDER — SODIUM CHLORIDE 0.9 % IR SOLN
Status: DC | PRN
  Administered 2017-06-12: 500 mL

## 2017-06-12 MED ORDER — THROMBIN 5000 UNITS EX SOLR
CUTANEOUS | Status: AC
  Filled 2017-06-12: qty 5000

## 2017-06-12 MED ORDER — BISACODYL 10 MG RE SUPP: 10.0000 mg | Freq: Every day | RECTAL | Status: DC | PRN

## 2017-06-12 MED ORDER — ZOLPIDEM TARTRATE 5 MG PO TABS: 5.0000 mg | ORAL_TABLET | Freq: Every evening | ORAL | Status: DC | PRN

## 2017-06-12 MED ORDER — ONDANSETRON HCL 4 MG PO TABS
4.0000 mg | ORAL_TABLET | Freq: Four times a day (QID) | ORAL | Status: DC | PRN
  Administered 2017-06-12: 4 mg via ORAL
  Filled 2017-06-12: qty 1

## 2017-06-12 MED ORDER — ONDANSETRON HCL 4 MG/2ML IJ SOLN: 4.0000 mg | Freq: Four times a day (QID) | INTRAMUSCULAR | Status: DC | PRN

## 2017-06-12 MED ORDER — MIDAZOLAM HCL 5 MG/5ML IJ SOLN
INTRAMUSCULAR | Status: DC | PRN
  Administered 2017-06-12: 2 mg via INTRAVENOUS

## 2017-06-12 MED ORDER — LIDOCAINE HCL (CARDIAC) 20 MG/ML IV SOLN
INTRAVENOUS | Status: AC
  Filled 2017-06-12: qty 5

## 2017-06-12 MED ORDER — DEXAMETHASONE SODIUM PHOSPHATE 10 MG/ML IJ SOLN
INTRAMUSCULAR | Status: DC | PRN
  Administered 2017-06-12: 10 mg via INTRAVENOUS

## 2017-06-12 MED ORDER — CYCLOBENZAPRINE HCL 10 MG PO TABS
ORAL_TABLET | ORAL | Status: AC
  Filled 2017-06-12: qty 1

## 2017-06-12 MED ORDER — VANCOMYCIN HCL 10 G IV SOLR
1250.0000 mg | Freq: Once | INTRAVENOUS | Status: AC
  Administered 2017-06-13: 1250 mg via INTRAVENOUS
  Filled 2017-06-12: qty 1250

## 2017-06-12 MED ORDER — PROPOFOL 10 MG/ML IV BOLUS
INTRAVENOUS | Status: DC | PRN
  Administered 2017-06-12: 150 mg via INTRAVENOUS

## 2017-06-12 MED ORDER — SODIUM CHLORIDE 0.9% FLUSH
3.0000 mL | INTRAVENOUS | Status: DC | PRN
  Administered 2017-06-12: 3 mL via INTRAVENOUS
  Filled 2017-06-12: qty 3

## 2017-06-12 MED ORDER — MENTHOL 3 MG MT LOZG: 1.0000 | LOZENGE | OROMUCOSAL | Status: DC | PRN

## 2017-06-12 MED ORDER — MORPHINE SULFATE (PF) 4 MG/ML IV SOLN: 4.0000 mg | INTRAVENOUS | Status: DC | PRN

## 2017-06-12 MED ORDER — PANTOPRAZOLE SODIUM 40 MG PO TBEC: 80.0000 mg | DELAYED_RELEASE_TABLET | Freq: Every day | ORAL | Status: DC

## 2017-06-12 MED ORDER — MEPERIDINE HCL 50 MG/ML IJ SOLN: 6.2500 mg | INTRAMUSCULAR | Status: DC | PRN

## 2017-06-12 MED ORDER — LIDOCAINE HCL (CARDIAC) 20 MG/ML IV SOLN
INTRAVENOUS | Status: DC | PRN
  Administered 2017-06-12: 100 mg via INTRAVENOUS

## 2017-06-12 MED ORDER — CYCLOBENZAPRINE HCL 10 MG PO TABS
10.0000 mg | ORAL_TABLET | Freq: Three times a day (TID) | ORAL | Status: DC | PRN
  Administered 2017-06-12 (×2): 10 mg via ORAL
  Filled 2017-06-12: qty 1

## 2017-06-12 MED ORDER — ROCURONIUM BROMIDE 10 MG/ML (PF) SYRINGE
PREFILLED_SYRINGE | INTRAVENOUS | Status: AC
  Filled 2017-06-12: qty 5

## 2017-06-12 MED ORDER — SUGAMMADEX SODIUM 500 MG/5ML IV SOLN
INTRAVENOUS | Status: DC | PRN
  Administered 2017-06-12: 250 mg via INTRAVENOUS

## 2017-06-12 MED ORDER — ONDANSETRON HCL 4 MG/2ML IJ SOLN
INTRAMUSCULAR | Status: AC
  Filled 2017-06-12: qty 2

## 2017-06-12 MED ORDER — HYDROMORPHONE HCL 2 MG PO TABS
ORAL_TABLET | ORAL | Status: AC
  Filled 2017-06-12: qty 2

## 2017-06-12 MED ORDER — ACETAMINOPHEN 500 MG PO TABS
1000.0000 mg | ORAL_TABLET | Freq: Four times a day (QID) | ORAL | Status: AC
  Administered 2017-06-12 – 2017-06-13 (×4): 1000 mg via ORAL
  Filled 2017-06-12 (×4): qty 2

## 2017-06-12 MED ORDER — HYDROMORPHONE HCL 2 MG PO TABS
4.0000 mg | ORAL_TABLET | ORAL | Status: DC | PRN
  Administered 2017-06-12 (×3): 4 mg via ORAL
  Filled 2017-06-12 (×2): qty 2

## 2017-06-12 MED ORDER — ONDANSETRON HCL 4 MG/2ML IJ SOLN: 4.0000 mg | Freq: Once | INTRAMUSCULAR | Status: DC | PRN

## 2017-06-12 MED ORDER — BACITRACIN ZINC 500 UNIT/GM EX OINT
TOPICAL_OINTMENT | CUTANEOUS | Status: AC
  Filled 2017-06-12: qty 28.35

## 2017-06-12 MED ORDER — HYDROMORPHONE HCL 2 MG PO TABS
2.0000 mg | ORAL_TABLET | ORAL | Status: DC | PRN
  Administered 2017-06-12 – 2017-06-13 (×3): 4 mg via ORAL
  Filled 2017-06-12 (×3): qty 2

## 2017-06-12 MED ORDER — SUGAMMADEX SODIUM 500 MG/5ML IV SOLN
INTRAVENOUS | Status: AC
  Filled 2017-06-12: qty 5

## 2017-06-12 MED ORDER — HYDROMORPHONE HCL 1 MG/ML IJ SOLN
0.2500 mg | INTRAMUSCULAR | Status: DC | PRN
  Administered 2017-06-12 (×2): 0.5 mg via INTRAVENOUS

## 2017-06-12 MED ORDER — ACETAMINOPHEN 325 MG PO TABS: 650.0000 mg | ORAL_TABLET | ORAL | Status: DC | PRN

## 2017-06-12 MED ORDER — VITAMIN D 1000 UNITS PO TABS
2000.0000 [IU] | ORAL_TABLET | Freq: Every day | ORAL | Status: DC
  Administered 2017-06-13: 2000 [IU] via ORAL
  Filled 2017-06-12: qty 2

## 2017-06-12 SURGICAL SUPPLY — 57 items
APL SKNCLS STERI-STRIP NONHPOA (GAUZE/BANDAGES/DRESSINGS) ×1
BAG DECANTER FOR FLEXI CONT (MISCELLANEOUS) ×3 IMPLANT
BENZOIN TINCTURE PRP APPL 2/3 (GAUZE/BANDAGES/DRESSINGS) ×3 IMPLANT
BLADE CLIPPER SURG (BLADE) IMPLANT
BUR MATCHSTICK NEURO 3.0 LAGG (BURR) ×3 IMPLANT
BUR PRECISION FLUTE 6.0 (BURR) ×3 IMPLANT
CANISTER SUCT 3000ML PPV (MISCELLANEOUS) ×3 IMPLANT
CARTRIDGE OIL MAESTRO DRILL (MISCELLANEOUS) ×1 IMPLANT
CLOSURE WOUND 1/2 X4 (GAUZE/BANDAGES/DRESSINGS) ×1
DIFFUSER DRILL AIR PNEUMATIC (MISCELLANEOUS) ×3 IMPLANT
DRAPE LAPAROTOMY 100X72X124 (DRAPES) ×3 IMPLANT
DRAPE MICROSCOPE LEICA (MISCELLANEOUS) ×3 IMPLANT
DRAPE POUCH INSTRU U-SHP 10X18 (DRAPES) ×3 IMPLANT
DRAPE SURG 17X23 STRL (DRAPES) ×12 IMPLANT
DRSG OPSITE POSTOP 4X6 (GAUZE/BANDAGES/DRESSINGS) ×2 IMPLANT
ELECT BLADE 4.0 EZ CLEAN MEGAD (MISCELLANEOUS) ×3
ELECT REM PT RETURN 9FT ADLT (ELECTROSURGICAL) ×3
ELECTRODE BLDE 4.0 EZ CLN MEGD (MISCELLANEOUS) ×1 IMPLANT
ELECTRODE REM PT RTRN 9FT ADLT (ELECTROSURGICAL) ×1 IMPLANT
GAUZE SPONGE 4X4 12PLY STRL (GAUZE/BANDAGES/DRESSINGS) ×1 IMPLANT
GAUZE SPONGE 4X4 16PLY XRAY LF (GAUZE/BANDAGES/DRESSINGS) IMPLANT
GLOVE BIO SURGEON STRL SZ7 (GLOVE) ×2 IMPLANT
GLOVE BIO SURGEON STRL SZ8 (GLOVE) ×5 IMPLANT
GLOVE BIO SURGEON STRL SZ8.5 (GLOVE) ×3 IMPLANT
GLOVE BIOGEL PI IND STRL 7.0 (GLOVE) IMPLANT
GLOVE BIOGEL PI IND STRL 7.5 (GLOVE) IMPLANT
GLOVE BIOGEL PI IND STRL 8.5 (GLOVE) IMPLANT
GLOVE BIOGEL PI INDICATOR 7.0 (GLOVE) ×6
GLOVE BIOGEL PI INDICATOR 7.5 (GLOVE) ×2
GLOVE BIOGEL PI INDICATOR 8.5 (GLOVE) ×2
GLOVE EXAM NITRILE LRG STRL (GLOVE) IMPLANT
GLOVE EXAM NITRILE XL STR (GLOVE) IMPLANT
GLOVE EXAM NITRILE XS STR PU (GLOVE) IMPLANT
GOWN STRL REUS W/ TWL LRG LVL3 (GOWN DISPOSABLE) IMPLANT
GOWN STRL REUS W/ TWL XL LVL3 (GOWN DISPOSABLE) ×1 IMPLANT
GOWN STRL REUS W/TWL 2XL LVL3 (GOWN DISPOSABLE) IMPLANT
GOWN STRL REUS W/TWL LRG LVL3 (GOWN DISPOSABLE)
GOWN STRL REUS W/TWL XL LVL3 (GOWN DISPOSABLE) ×9
KIT BASIN OR (CUSTOM PROCEDURE TRAY) ×3 IMPLANT
KIT TURNOVER KIT B (KITS) ×3 IMPLANT
NDL HYPO 21X1.5 SAFETY (NEEDLE) IMPLANT
NEEDLE HYPO 21X1.5 SAFETY (NEEDLE) IMPLANT
NEEDLE HYPO 22GX1.5 SAFETY (NEEDLE) ×3 IMPLANT
NS IRRIG 1000ML POUR BTL (IV SOLUTION) ×3 IMPLANT
OIL CARTRIDGE MAESTRO DRILL (MISCELLANEOUS) ×3
PACK LAMINECTOMY NEURO (CUSTOM PROCEDURE TRAY) ×3 IMPLANT
PAD ARMBOARD 7.5X6 YLW CONV (MISCELLANEOUS) ×15 IMPLANT
PATTIES SURGICAL .5 X1 (DISPOSABLE) IMPLANT
RUBBERBAND STERILE (MISCELLANEOUS) ×6 IMPLANT
SPONGE SURGIFOAM ABS GEL SZ50 (HEMOSTASIS) ×1 IMPLANT
STRIP CLOSURE SKIN 1/2X4 (GAUZE/BANDAGES/DRESSINGS) ×2 IMPLANT
SUT VIC AB 1 CT1 18XBRD ANBCTR (SUTURE) ×2 IMPLANT
SUT VIC AB 1 CT1 8-18 (SUTURE) ×6
SUT VIC AB 2-0 CP2 18 (SUTURE) ×6 IMPLANT
TOWEL GREEN STERILE (TOWEL DISPOSABLE) ×3 IMPLANT
TOWEL GREEN STERILE FF (TOWEL DISPOSABLE) ×3 IMPLANT
WATER STERILE IRR 1000ML POUR (IV SOLUTION) ×3 IMPLANT

## 2017-06-12 NOTE — Plan of Care (Signed)
  Problem: Activity: Goal: Ability to avoid complications of mobility impairment will improve Outcome: Progressing Goal: Ability to tolerate increased activity will improve Outcome: Progressing Goal: Will remain free from falls Outcome: Progressing   Problem: Education: Goal: Ability to verbalize activity precautions or restrictions will improve Outcome: Progressing Goal: Knowledge of the prescribed therapeutic regimen will improve Outcome: Progressing Goal: Understanding of discharge needs will improve Outcome: Progressing   Problem: Physical Regulation: Goal: Ability to maintain clinical measurements within normal limits will improve Outcome: Progressing Goal: Postoperative complications will be avoided or minimized Outcome: Progressing Goal: Diagnostic test results will improve Outcome: Progressing   Problem: Pain Management: Goal: Pain level will decrease Outcome: Progressing   Problem: Health Behavior/Discharge Planning: Goal: Identification of resources available to assist in meeting health care needs will improve Outcome: Progressing   

## 2017-06-12 NOTE — Op Note (Signed)
Brief history: The patient is a 72 year old white female who has complained of back and leg pain consistent with neurogenic claudication.  She has failed medical management and was worked up with a lumbar CT.  This demonstrated a mild spondylolisthesis at L3-4 and L4-5 with spinal stenosis.  I discussed the various treatment option with the patient including surgery.  She has decided proceed with a lumbar laminectomy after weighing the risks, benefits and alternatives.  Preoperative diagnosis: L3-4 and L4-5 spondylolisthesis, spinal stenosis, lumbago, lumbar radiculopathy, neurogenic claudication  Postoperative diagnosis: The same  Procedure: L3-4 and L4-5 laminectomy/laminotomy/foraminotomy to decompress the bilateral L3, L4 and L5 nerve roots using micro-dissection  Surgeon: Dr. Earle Gell  Asst.: Dr. Erline Levine  Anesthesia: Gen. endotracheal  Estimated blood loss: 100 cc  Drains: None  Complications: None  Description of procedure: The patient was brought to the operating room by the anesthesia team. General endotracheal anesthesia was induced. The patient was turned to the prone position on the Wilson frame. The patient's lumbosacral region was then prepared with Betadine scrub and Betadine solution. Sterile drapes were applied.  I then injected the area to be incised with Marcaine with epinephrine solution. I then used a scalpel to make a linear midline incision over the L3-4 and L4-5 intervertebral disc space. I then used electrocautery to perform a bilateral  subperiosteal dissection exposing the spinous process and lamina of L3, L4 and L5. We obtained intraoperative radiograph to confirm our location. I then inserted the [facility] retractor for exposure.  We began the decompression by incising the interspinous ligament at L3-4 and L4-5.  I then used a Leksell rongeur to remove the L4 spinous process and the caudal aspect of the L3 spinous process.  We then brought the operative  microscope into the field. Under its magnification and illumination we completed the microdissection. I used a high-speed drill to perform a laminotomy at L3-4 and L4-5 bilaterally. I then used a Kerrison punches to complete the L4 laminectomy and to widen the laminotomies bilaterally at L3-4 and L4-5 and removed the ligamentum flavum at L3-4 and L4-5. We then used microdissection to free up the thecal sac and the L3, L4 and L5 nerve root from the epidural tissue. I then used a Kerrison punch to perform a foraminotomy at about the bilateral L3, L4 and L5 nerve root.  We inspected the intervertebral disc at L3-4 and L4-5 bilaterally.  There were no herniations.  I then palpated along the ventral surface of the thecal sac and along exit route of the bilateral L3, L4 and L5 nerve root and noted that the neural structures were well decompressed. This completed the decompression.  We then obtained hemostasis using bipolar electrocautery. We irrigated the wound out with bacitracin solution. We then removed the retractor. We then reapproximated the patient's thoracolumbar fascia with interrupted #1 Vicryl suture. We then reapproximated the patient's subcutaneous tissue with interrupted 2-0 Vicryl suture. We then reapproximated patient's skin with Steri-Strips and benzoin. The was then coated with bacitracin ointment. The drapes were removed. The patient was subsequently returned to the supine position where they were extubated by the anesthesia team. The patient was then transported to the postanesthesia care unit in stable condition. All sponge instrument and needle counts were reportedly correct at the end of this case.

## 2017-06-12 NOTE — Anesthesia Procedure Notes (Signed)
Procedure Name: Intubation Date/Time: 06/12/2017 7:37 AM Performed by: Jenne Campus, CRNA Pre-anesthesia Checklist: Patient identified, Emergency Drugs available, Suction available and Patient being monitored Patient Re-evaluated:Patient Re-evaluated prior to induction Oxygen Delivery Method: Circle System Utilized Preoxygenation: Pre-oxygenation with 100% oxygen Induction Type: IV induction Ventilation: Mask ventilation without difficulty Laryngoscope Size: Miller and 2 Grade View: Grade I Tube type: Oral Tube size: 7.5 mm Number of attempts: 1 Airway Equipment and Method: Stylet and Oral airway Placement Confirmation: ETT inserted through vocal cords under direct vision,  positive ETCO2 and breath sounds checked- equal and bilateral Secured at: 22 cm Tube secured with: Tape Dental Injury: Teeth and Oropharynx as per pre-operative assessment

## 2017-06-12 NOTE — Anesthesia Preprocedure Evaluation (Addendum)
Anesthesia Evaluation  Patient identified by MRN, date of birth, ID band Patient awake    Reviewed: Allergy & Precautions, NPO status , Patient's Chart, lab work & pertinent test results  Airway Mallampati: I  TM Distance: >3 FB Neck ROM: Full    Dental  (+) Teeth Intact, Dental Advisory Given   Pulmonary neg pulmonary ROS,    Pulmonary exam normal breath sounds clear to auscultation       Cardiovascular hypertension, Pt. on medications Normal cardiovascular exam+ Valvular Problems/Murmurs  Rhythm:Regular Rate:Normal     Neuro/Psych PSYCHIATRIC DISORDERS Anxiety    GI/Hepatic GERD  Controlled and Medicated,  Endo/Other  Morbid obesity  Renal/GU      Musculoskeletal   Abdominal   Peds  Hematology   Anesthesia Other Findings   Reproductive/Obstetrics                            Anesthesia Physical Anesthesia Plan  ASA: III  Anesthesia Plan: General   Post-op Pain Management:    Induction: Intravenous  PONV Risk Score and Plan: 3 and Ondansetron and Treatment may vary due to age or medical condition  Airway Management Planned: Oral ETT  Additional Equipment:   Intra-op Plan:   Post-operative Plan: Extubation in OR  Informed Consent: I have reviewed the patients History and Physical, chart, labs and discussed the procedure including the risks, benefits and alternatives for the proposed anesthesia with the patient or authorized representative who has indicated his/her understanding and acceptance.     Plan Discussed with: CRNA and Surgeon  Anesthesia Plan Comments:         Anesthesia Quick Evaluation

## 2017-06-12 NOTE — Transfer of Care (Signed)
Immediate Anesthesia Transfer of Care Note  Patient: Elizabeth [name]  Procedure(s) Performed: LAMINECTOMY AND FORAMINOTOMY LUMBAR THREE- LUMBAR FOUR, LUMBAR FOUR- LUMBAR FIVE (N/A Spine Lumbar)  Patient Location: PACU  Anesthesia Type:General  Level of Consciousness: awake, oriented and patient cooperative  Airway & Oxygen Therapy: Patient Spontanous Breathing and Patient connected to nasal cannula oxygen  Post-op Assessment: Report given to RN and Post -op Vital signs reviewed and stable  Post vital signs: Reviewed  Last Vitals:  Vitals Value Taken Time  BP 115/68 06/12/2017  9:43 AM  Temp    Pulse 83 06/12/2017  9:42 AM  Resp 24 06/12/2017  9:42 AM  SpO2 97 % 06/12/2017  9:42 AM  Vitals shown include unvalidated device data.  Last Pain:  Vitals:   06/12/17 0621  TempSrc:   PainSc: 8       Patients Stated Pain Goal: 3 (38/88/28 0034)  Complications: No apparent anesthesia complications

## 2017-06-12 NOTE — H&P (Signed)
Subjective: The patient is a 72 year old white female who was cleaned of back and leg pain consistent with neurogenic claudication.  She has failed medical management.  She was worked up with a lumbar MRI which demonstrated L3-4 and L4-5 spinal stenosis.  I discussed the various treatment options with the patient including surgery.  She has decided to proceed with a laminectomy.  Past Medical History:  Diagnosis Date  . Anxiety    extreme  . Aortic valve disorder   . Benign hypertension with CKD (chronic kidney disease), stage II   . Claustrophobia   . Edema   . GERD (gastroesophageal reflux disease)   . Heart murmur   . Hyperuricemia   . Lumbar stenosis   . Osteoarthrosis   . Vitamin D deficiency     Past Surgical History:  Procedure Laterality Date  . ABDOMINAL HYSTERECTOMY    . APPENDECTOMY    . GALLBLADDER SURGERY      Allergies  Allergen Reactions  . Codeine Nausea And Vomiting  . Hydrocodone Nausea And Vomiting  . Lipitor [Atorvastatin] Itching and Other (See Comments)    Redness and itching all over body  . Penicillins Hives    Has patient had a PCN reaction causing immediate rash, facial/tongue/throat swelling, SOB or lightheadedness with hypotension: Yes Has patient had a PCN reaction causing severe rash involving mucus membranes or skin necrosis: Yes Has patient had a PCN reaction that required hospitalization: No Has patient had a PCN reaction occurring within the last 10 years: No If all of the above answers are "NO", then may proceed with Cephalosporin use.   . Prednisone Other (See Comments)    Red and hot all over     Social History   Tobacco Use  . Smoking status: Never Smoker  . Smokeless tobacco: Never Used  Substance Use Topics  . Alcohol use: No    Frequency: Never    History reviewed. No pertinent family history. Prior to Admission medications   Medication Sig Start Date End Date Taking? Authorizing Provider  Cholecalciferol (VITAMIN D3) 2000  units TABS Take 2,000 Units by mouth daily with breakfast.   Yes [provider]  Cyanocobalamin (VITAMIN B-12) 5000 MCG SUBL Place 5,000 mcg under the tongue daily with breakfast.   Yes [provider]  omeprazole (PRILOSEC) 40 MG capsule Take 40 mg by mouth daily as needed (for acid reflux/indigestion.).   Yes [provider]  oxyCODONE-acetaminophen (PERCOCET/ROXICET) 5-325 MG tablet Take 1 tablet by mouth every 4 (four) hours as needed for severe pain (for severe pain.).    Yes [provider]  Turmeric 500 MG TABS Take 500 mg by mouth daily with breakfast.   Yes [provider]  diclofenac sodium (VOLTAREN) 1 % GEL Apply 2 g topically 3 (three) times daily as needed. Patient not taking: Reported on 04/05/2017 03/17/17   Alda Berthold, DO     Review of Systems  Positive ROS: As above  All other systems have been reviewed and were otherwise negative with the exception of those mentioned in the HPI and as above.  Objective: Vital signs in last 24 hours: Temp:  [98.7 F (37.1 C)] 98.7 F (37.1 C) (04/01 0618) Pulse Rate:  [67] 67 (04/01 0618) Resp:  [18] 18 (04/01 0618) SpO2:  [97 %] 97 % (04/01 0618) Weight:  [117 kg (258 lb)] 117 kg (258 lb) (04/01 0621) Estimated body mass index is 42.93 kg/m as calculated from the following:   Height as  of this encounter: 5\' 5"  (1.651 m).   Weight as of this encounter: 117 kg (258 lb).   General Appearance: Alert, obese Head: Normocephalic, without obvious abnormality, atraumatic Eyes: PERRL, conjunctiva/corneas clear, EOM's intact,    Ears: Normal  Throat: Normal  Neck: Supple, Back: unremarkable Lungs: Clear to auscultation bilaterally, respirations unlabored Heart: Regular rate and rhythm, no murmur, rub or gallop Abdomen: Soft, non-tender Extremities: Extremities normal, atraumatic, no cyanosis or edema Skin: unremarkable  NEUROLOGIC:   Mental status: alert and oriented,Motor Exam -  grossly normal Sensory Exam - grossly normal Reflexes:  Coordination - grossly normal Gait - grossly normal Balance - grossly normal Cranial Nerves: I: smell Not tested  II: visual acuity  OS: Normal  OD: Normal   II: visual fields Full to confrontation  II: pupils Equal, round, reactive to light  III,VII: ptosis None  III,IV,VI: extraocular muscles  Full ROM  V: mastication Normal  V: facial light touch sensation  Normal  V,VII: corneal reflex  Present  VII: facial muscle function - upper  Normal  VII: facial muscle function - lower Normal  VIII: hearing Not tested  IX: soft palate elevation  Normal  IX,X: gag reflex Present  XI: trapezius strength  5/5  XI: sternocleidomastoid strength 5/5  XI: neck flexion strength  5/5  XII: tongue strength  Normal    Data Review Lab Results  Component Value Date   WBC 7.5 06/05/2017   HGB 13.1 06/05/2017   HCT 39.7 06/05/2017   MCV 90.0 06/05/2017   PLT 180 06/05/2017   Lab Results  Component Value Date   NA 143 06/05/2017   K 4.1 06/05/2017   CL 112 (H) 06/05/2017   CO2 23 06/05/2017   BUN 18 06/05/2017   CREATININE 0.80 06/05/2017   GLUCOSE 81 06/05/2017   No results found for: INR, PROTIME  Assessment/Plan: L3-4 and L4-5 spinal stenosis, lumbago, lumbar radiculopathy, neurogenic claudication: I have discussed the situation with the patient and reviewed her imaging studies with her.  I have pointed out the abnormalities.  We have discussed the various treatment options including surgery.  I have described the surgical treatment option of an L3-4 and L4-5 laminectomy/laminotomy/foraminotomies.  I have shown her surgical models.  We have discussed the risks, benefits, alternatives, expected postoperative course, and likelihood of achieving our goals with surgery.  I have answered all the patient's questions.  She has decided to proceed with surgery.   Ophelia Charter 06/12/2017 7:18 AM

## 2017-06-12 NOTE — Evaluation (Signed)
Physical Therapy Evaluation Patient Details Name: Elizabeth [name] MRN: [id number] DOB: 06-09-1945 Today's Date: 06/12/2017   History of Present Illness  Pt is a 72 y/o female s/p L3-5 laminectomy. PMH includes HTN, CKD II, heart murmur, and aortic valve disorder.   Clinical Impression  Patient is s/p above surgery resulting in the deficits listed below (see PT Problem List). Pt presenting with slight unsteadiness requiring min to min guard and HHA for ambulation. Pt reports LE symptoms had improved. Pt did have one instance of dizziness after turning during gait and required standing rest for symptoms to resolve. Reviewed back precautions and walking program with pt. Patient will benefit from skilled PT to increase their independence and safety with mobility (while adhering to their precautions) to allow discharge to the venue listed below.     Follow Up Recommendations No PT follow up;Supervision for mobility/OOB(would benefit from outpatien PT once cleared by MD)    Equipment Recommendations  3in1 (PT)    Recommendations for Other Services       Precautions / Restrictions Precautions Precautions: Back Precaution Booklet Issued: Yes (comment) Precaution Comments: Reviewed back precautions with pt. Restrictions Weight Bearing Restrictions: No      Mobility  Bed Mobility Overal bed mobility: Needs Assistance Bed Mobility: Rolling;Sidelying to Sit;Sit to Sidelying Rolling: Min guard Sidelying to sit: Min guard     Sit to sidelying: Min assist General bed mobility comments: Min guard to ensure log roll technique to come up to sitting. Min A for LE assist to return to sidelying.   Transfers Overall transfer level: Needs assistance Equipment used: None Transfers: Sit to/from Stand Sit to Stand: Min guard         General transfer comment: Min guard for safety.   Ambulation/Gait Ambulation/Gait assistance: Min guard;Min assist Ambulation Distance (Feet): 300 Feet Assistive  device: 1 person hand held assist Gait Pattern/deviations: Step-through pattern;Decreased stride length Gait velocity: Decreased  Gait velocity interpretation: Below normal speed for age/gender General Gait Details: Slow, guarded, slightly unsteady gait. Required min to min guard A and HHA for steadying. During middle of gait when turning around to come back to room, pt with dizziness and required standing rest and min A to steady. Otherwise tolerated gait well. Educated about generalized walking program to perform at home.   Stairs            Wheelchair Mobility    Modified Rankin (Stroke Patients Only)       Balance Overall balance assessment: Needs assistance Sitting-balance support: No upper extremity supported;Feet supported Sitting balance-Leahy Scale: Good     Standing balance support: No upper extremity supported;Single extremity supported;During functional activity Standing balance-Leahy Scale: Fair Standing balance comment: Able to maintain static standing without UE support.                              Pertinent Vitals/Pain Pain Assessment: Faces Faces Pain Scale: Hurts little more Pain Location: back  Pain Descriptors / Indicators: Aching;Operative site guarding Pain Intervention(s): Limited activity within patient's tolerance;Monitored during session;Repositioned    Home Living Family/patient expects to be discharged to:: Private residence Living Arrangements: Spouse/significant other Available Help at Discharge: Family;Available 24 hours/day Type of Home: House Home Access: Stairs to enter Entrance Stairs-Rails: Right;Left;Can reach both Entrance Stairs-Number of Steps: 2 Home Layout: Other (Comment)(1 step into bedroom) Home Equipment: None      Prior Function Level of Independence: Independent  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   Communication   Communication: No difficulties  Cognition Arousal/Alertness: Awake/alert Behavior During Therapy: WFL for tasks assessed/performed Overall Cognitive Status: Within Functional Limits for tasks assessed                                        General Comments      Exercises     Assessment/Plan    PT Assessment Patient needs continued PT services  PT Problem List Decreased strength;Decreased balance;Decreased mobility;Decreased knowledge of use of DME;Decreased knowledge of precautions;Pain       PT Treatment Interventions DME instruction;Gait training;Stair training;Functional mobility training;Therapeutic activities;Therapeutic exercise;Balance training;Neuromuscular re-education;Patient/family education    PT Goals (Current goals can be found in the Care Plan section)  Acute Rehab PT Goals Patient Stated Goal: to go home PT Goal Formulation: With patient Time For Goal Achievement: 06/26/17 Potential to Achieve Goals: Good    Frequency Min 5X/week   Barriers to discharge        Co-evaluation               AM-PAC PT "6 Clicks" Daily Activity  Outcome Measure Difficulty turning over in bed (including adjusting bedclothes, sheets and blankets)?: Unable Difficulty moving from lying on back to sitting on the side of the bed? : Unable Difficulty sitting down on and standing up from a chair with arms (e.g., wheelchair, bedside commode, etc,.)?: Unable Help needed moving to and from a bed to chair (including a wheelchair)?: A Little Help needed walking in hospital room?: A Little Help needed climbing 3-5 steps with a railing? : A Little 6 Click Score: 12    End  of Session Equipment Utilized During Treatment: Gait belt Activity Tolerance: Patient tolerated treatment well Patient left: in bed;with call bell/phone within reach Nurse Communication: Mobility status PT Visit Diagnosis: Other abnormalities of gait and mobility (R26.89);Unsteadiness on feet (R26.81);Pain Pain - part of body: (back )    Time: 9767-3419 PT Time Calculation (min) (ACUTE ONLY): 18 min   Charges:   PT Evaluation $PT Eval Low Complexity: 1 Low     PT G Codes:        Leighton Ruff, PT, DPT  Acute Rehabilitation Services  Pager: [phone]   Rudean Hitt 06/12/2017, 2:13 PM

## 2017-06-12 NOTE — Evaluation (Signed)
Occupational Therapy Evaluation Patient Details Name: SOMYA JAUREGUI MRN: [id number] DOB: [birth year] Today's Date: 06/12/2017    History of Present Illness Pt is a 72 y/o female s/p L3-5 laminectomy. PMH includes HTN, CKD II, heart murmur, and aortic valve disorder.    Clinical Impression   PTA, pt was living with her husband and was independent. Currently, pt requires supervision-Min guard A for ADLs and functional mobility. Provided education on back precautions, bed mobility, LB ADLs with AE, toilet transfer, and tub transfer with 3N1; pt demonstrated understanding. Answered all pt questions. Recommend dc home once medically stable per physician. All acute OT needs met and will sign off. Thank you.     Follow Up Recommendations  No OT follow up;Supervision/Assistance - 24 hour    Equipment Recommendations  3 in 1 bedside commode    Recommendations for Other Services PT consult     Precautions / Restrictions Precautions Precautions: Back Precaution Booklet Issued: Yes (comment)(Handout in room. Reviewed information) Precaution Comments: Pt requiring Min cues for recalling no lifting. Required Braces or Orthoses: (No brace per MD order) Restrictions Weight Bearing Restrictions: No      Mobility Bed Mobility Overal bed mobility: Needs Assistance Bed Mobility: Rolling;Sidelying to Sit;Sit to Sidelying Rolling: Min guard Sidelying to sit: Min guard     Sit to sidelying: Min guard General bed mobility comments: min guard for safety  Transfers Overall transfer level: Needs assistance Equipment used: None Transfers: Sit to/from Stand Sit to Stand: Min guard         General transfer comment: Min guard for safety.     Balance Overall balance assessment: Needs assistance Sitting-balance support: No upper extremity supported;Feet supported Sitting balance-Leahy Scale: Good     Standing balance support: No upper extremity supported;Single extremity supported;During  functional activity Standing balance-Leahy Scale: Fair Standing balance comment: Able to maintain static standing without UE support.                            ADL either performed or assessed with clinical judgement   ADL Overall ADL's : Needs assistance/impaired Eating/Feeding: Set up;Sitting   Grooming: Set up;Supervision/safety;Standing Grooming Details (indicate cue type and reason): Educating pt on compensatory techniques for oral care Upper Body Bathing: Set up;Sitting   Lower Body Bathing: Min guard;Sit to/from stand Lower Body Bathing Details (indicate cue type and reason): Educating pt on AE for LB bathing Upper Body Dressing : Set up;Sitting Upper Body Dressing Details (indicate cue type and reason): Pt donning night gown while sitting Lower Body Dressing: Min guard;Sit to/from stand;With adaptive equipment Lower Body Dressing Details (indicate cue type and reason): Pt donning underwear with AE and min guard for safety in standing. Educating pt on AE for donning socks and shoes.  Toilet Transfer: Supervision/safety;Ambulation;Regular Toilet;Grab bars   Toileting- Clothing Manipulation and Hygiene: Supervision/safety;Cueing for back precautions;Sit to/from stand   Tub/ Shower Transfer: Tub transfer;Min guard;Ambulation Tub/Shower Transfer Details (indicate cue type and reason): Educating pt on safe tub transfer. pt demonstrating understanding Functional mobility during ADLs: Min guard General ADL Comments: Providing education on back precautions, bed mobility, LB ADLs with AE, toileting, and tub transfers. Pt demosntrating understanding of education.     Vision         Perception     Praxis      Pertinent Vitals/Pain Pain Assessment: Faces Faces Pain Scale: Hurts a little bit Pain Location: back  Pain Descriptors / Indicators: Aching;Operative site guarding  Pain Intervention(s): Monitored during session;Repositioned     Hand Dominance Right    Extremity/Trunk Assessment Upper Extremity Assessment Upper Extremity Assessment: Overall WFL for tasks assessed   Lower Extremity Assessment Lower Extremity Assessment: Defer to PT evaluation   Cervical / Trunk Assessment Cervical / Trunk Assessment: Other exceptions Cervical / Trunk Exceptions: s/p lumbar surgery.    Communication Communication Communication: No difficulties   Cognition Arousal/Alertness: Awake/alert Behavior During Therapy: WFL for tasks assessed/performed Overall Cognitive Status: Within Functional Limits for tasks assessed                                     General Comments  Sister present during session    Exercises     Shoulder Instructions      Home Living Family/patient expects to be discharged to:: Private residence Living Arrangements: Spouse/significant other Available Help at Discharge: Family;Available 24 hours/day Type of Home: House Home Access: Stairs to enter [employer] of Steps: 2 Entrance Stairs-Rails: Right;Left;Can reach both Home Layout: Other (Comment)(1 step into bedroom)     Bathroom Shower/Tub: Tub/shower unit;Walk-in shower   Bathroom Toilet: Handicapped height     Home Equipment: None          Prior Functioning/Environment Level of Independence: Independent        Comments: ADLs, IADLs, driving, Ran a daycare. Decreased cleaning due to pain in BLEs        OT Problem List: Decreased activity tolerance;Impaired balance (sitting and/or standing);Decreased knowledge of use of DME or AE;Decreased knowledge of precautions;Pain      OT Treatment/Interventions:      OT Goals(Current goals can be found in the care plan section) Acute Rehab OT Goals Patient Stated Goal: to go home OT Goal Formulation: All assessment and education complete, DC therapy  OT Frequency:     Barriers to D/C:            Co-evaluation              AM-PAC PT "6 Clicks" Daily Activity     Outcome  Measure Help from another person eating meals?: None Help from another person taking care of personal grooming?: A Little Help from another person toileting, which includes using toliet, bedpan, or urinal?: A Little Help from another person bathing (including washing, rinsing, drying)?: A Little Help from another person to put on and taking off regular upper body clothing?: None Help from another person to put on and taking off regular lower body clothing?: A Little 6 Click Score: 20   End of Session Nurse Communication: Mobility status;Precautions  Activity Tolerance: Patient tolerated treatment well Patient left: in bed;with call bell/phone within reach;with family/visitor present  OT Visit Diagnosis: Unsteadiness on feet (R26.81);Pain Pain - part of body: (Back)                Time: 3419-3790 OT Time Calculation (min): 20 min Charges:  OT General Charges $OT Visit: 1 Visit OT Evaluation $OT Eval Moderate Complexity: 1 Mod G-Codes:     Rydell Wiegel MSOT, OTR/L Acute Rehab Pager: [phone] Office: [place] 06/12/2017, 5:16 PM

## 2017-06-12 NOTE — Progress Notes (Signed)
Subjective:  the patient is alert and pleasant. She looks well. Her back is appropriately sore.  Objective: Vital signs in last 24 hours: Temp:  [97.2 F (36.2 C)-98.7 F (37.1 C)] 97.2 F (36.2 C) (04/01 0941) Pulse Rate:  [67-83] 81 (04/01 0954) Resp:  [10-19] 19 (04/01 0954) BP: (115)/(68) 115/68 (04/01 0945) SpO2:  [95 %-97 %] 97 % (04/01 0954) Weight:  [117 kg (258 lb)] 117 kg (258 lb) (04/01 0621) Estimated body mass index is 42.93 kg/m as calculated from the following:   Height as of this encounter: 5\' 5"  (1.651 m).   Weight as of this encounter: 117 kg (258 lb).   Intake/Output from previous day: No intake/output data recorded. Intake/Output this shift: Total I/O In: 800 [I.V.:800] Out: 20 [Blood:20]  Physical exam the patient is alert and oriented. She is moving her lower extremities well.  Lab Results: No results for input(s): WBC, HGB, HCT, PLT in the last 72 hours. BMET No results for input(s): NA, K, CL, CO2, GLUCOSE, BUN, CREATININE, CALCIUM in the last 72 hours.  Studies/Results: Dg Lumbar Spine 1 View  Result Date: 06/12/2017 CLINICAL DATA:  72 year old female undergoing lumbar surgery. EXAM: LUMBAR SPINE - 1 VIEW COMPARISON:  Lumbar radiographs 04/21/2017. Lumbar spine CT 04/12/2017. FINDINGS: Hypoplastic right and absent left ribs at T12 with full size ribs at T11 and normal lumbar segmentation. This is the same numbering system used on 04/12/2017. Intraoperative portable cross-table lateral view of the lumbar spine at 0903 hours. A surgical probe is directed toward the L3-L4 disc level, projecting over the inferior left L3 articulating facet. IMPRESSION: Intraoperative localization at L3-L4. Electronically Signed   By: Genevie Ann M.D.   On: 06/12/2017 08:35    Assessment/Plan: The patient is doing well. I spoke with her family.  LOS: 0 days     Ophelia Charter 06/12/2017, 9:59 AM

## 2017-06-12 NOTE — Anesthesia Postprocedure Evaluation (Signed)
Anesthesia Post Note  Patient: Elizabeth [name]  Procedure(s) Performed: LAMINECTOMY AND FORAMINOTOMY LUMBAR THREE- LUMBAR FOUR, LUMBAR FOUR- LUMBAR FIVE (N/A Spine Lumbar)     Patient location during evaluation: PACU Anesthesia Type: General Level of consciousness: awake and alert Pain management: pain level controlled Vital Signs Assessment: post-procedure vital signs reviewed and stable Respiratory status: spontaneous breathing, nonlabored ventilation, respiratory function stable and patient connected to nasal cannula oxygen Cardiovascular status: blood pressure returned to baseline and stable Postop Assessment: no apparent nausea or vomiting Anesthetic complications: no    Last Vitals:  Vitals:   06/12/17 1045 06/12/17 1106  BP:  (!) 163/99  Pulse: 69 65  Resp: 15 19  Temp: (!) 36.2 C 36.5 C  SpO2: 97% 97%    Last Pain:  Vitals:   06/12/17 1226  TempSrc:   PainSc: 9                  Dantre Yearwood DAVID

## 2017-06-13 ENCOUNTER — Encounter (HOSPITAL_COMMUNITY): Payer: Self-pay | Admitting: Neurosurgery

## 2017-06-13 DIAGNOSIS — Z79899 Other long term (current) drug therapy: Secondary | ICD-10-CM | POA: Diagnosis not present

## 2017-06-13 DIAGNOSIS — K219 Gastro-esophageal reflux disease without esophagitis: Secondary | ICD-10-CM | POA: Diagnosis not present

## 2017-06-13 DIAGNOSIS — N182 Chronic kidney disease, stage 2 (mild): Secondary | ICD-10-CM | POA: Diagnosis not present

## 2017-06-13 DIAGNOSIS — M4316 Spondylolisthesis, lumbar region: Secondary | ICD-10-CM | POA: Diagnosis not present

## 2017-06-13 DIAGNOSIS — M48062 Spinal stenosis, lumbar region with neurogenic claudication: Secondary | ICD-10-CM | POA: Diagnosis not present

## 2017-06-13 DIAGNOSIS — I359 Nonrheumatic aortic valve disorder, unspecified: Secondary | ICD-10-CM | POA: Diagnosis not present

## 2017-06-13 DIAGNOSIS — I129 Hypertensive chronic kidney disease with stage 1 through stage 4 chronic kidney disease, or unspecified chronic kidney disease: Secondary | ICD-10-CM | POA: Diagnosis not present

## 2017-06-13 DIAGNOSIS — M545 Low back pain: Secondary | ICD-10-CM | POA: Diagnosis not present

## 2017-06-13 DIAGNOSIS — M5416 Radiculopathy, lumbar region: Secondary | ICD-10-CM | POA: Diagnosis not present

## 2017-06-13 MED ORDER — HYDROMORPHONE HCL 2 MG PO TABS: 2.0000 mg | ORAL_TABLET | ORAL | 0 refills | Status: DC | PRN

## 2017-06-13 MED ORDER — CYCLOBENZAPRINE HCL 10 MG PO TABS: 10.0000 mg | ORAL_TABLET | Freq: Three times a day (TID) | ORAL | 1 refills | Status: DC | PRN

## 2017-06-13 MED ORDER — DOCUSATE SODIUM 100 MG PO CAPS: 100.0000 mg | ORAL_CAPSULE | Freq: Two times a day (BID) | ORAL | 0 refills | Status: DC

## 2017-06-13 MED FILL — Thrombin For Soln 5000 Unit: CUTANEOUS | Qty: 5000 | Status: AC

## 2017-06-13 NOTE — Discharge Summary (Signed)
Physician Discharge Summary  Patient ID: Elizabeth [name] MRN: [id number] DOB/AGE: 03/24/1945 72 y.o.  Admit date: 06/12/2017 Discharge date: 06/13/2017  Admission Diagnoses: Lumbar spinal stenosis with neurogenic claudication, lumbago, lumbar radiculopathy  Discharge Diagnoses: The same Active Problems:   Spondylolisthesis of lumbar region   Discharged Condition: good  Hospital Course: I performed an L3-4 and L4-5 laminectomy/laminotomy/foraminotomies on the patient on 06/12/2017.  The surgery went well.  The patient's postoperative course was unremarkable.  On postoperative day #1 she requested discharge to home.  She was given written and oral discharge instructions.  All her questions were answered.  Consults: Physical therapy Significant Diagnostic Studies: None Treatments: L3-4 and L4-5 laminectomy/laminotomy/foraminotomies using microdissection Discharge Exam: Blood pressure 137/65, pulse 64, temperature 98.2 F (36.8 C), temperature source Oral, resp. rate 18, height 5\' 5"  (1.651 m), weight 117 kg (258 lb), SpO2 94 %. Patient is alert and pleasant.  She looks well.  Her strength is normal.  Disposition: Home  Discharge Instructions    Call MD for:  difficulty breathing, headache or visual disturbances   Complete by:  As directed    Call MD for:  extreme fatigue   Complete by:  As directed    Call MD for:  hives   Complete by:  As directed    Call MD for:  persistant dizziness or light-headedness   Complete by:  As directed    Call MD for:  persistant nausea and vomiting   Complete by:  As directed    Call MD for:  redness, tenderness, or signs of infection (pain, swelling, redness, odor or green/yellow discharge around incision site)   Complete by:  As directed    Call MD for:  severe uncontrolled pain   Complete by:  As directed    Call MD for:  temperature >100.4   Complete by:  As directed    Diet - low sodium heart healthy   Complete by:  As directed    Discharge  instructions   Complete by:  As directed    Call [phone] for a followup appointment. Take a stool softener while you are using pain medications.   Driving Restrictions   Complete by:  As directed    Do not drive for 2 weeks.   Increase activity slowly   Complete by:  As directed    Lifting restrictions   Complete by:  As directed    Do not lift more than 5 pounds. No excessive bending or twisting.   May shower / Bathe   Complete by:  As directed    Remove the dressing for 3 days after surgery.  You may shower, but leave the incision alone.   Remove dressing in 48 hours   Complete by:  As directed    Your stitches are under the scan and will dissolve by themselves. The Steri-Strips will fall off after you take a few showers. Do not rub back or pick at the wound, Leave the wound alone.     Allergies as of 06/13/2017      Reactions   Codeine Nausea And Vomiting   Hydrocodone Nausea And Vomiting   Lipitor [atorvastatin] Itching, Other (See Comments)   Redness and itching all over body   Penicillins Hives   Has patient had a PCN reaction causing immediate rash, facial/tongue/throat swelling, SOB or lightheadedness with hypotension: Yes Has patient had a PCN reaction causing severe rash involving mucus membranes or skin necrosis: Yes Has patient had a PCN reaction that  required hospitalization: No Has patient had a PCN reaction occurring within the last 10 years: No If all of the above answers are "NO", then may proceed with Cephalosporin use.   Prednisone Other (See Comments)   Red and hot all over       Medication List    STOP taking these medications   diclofenac sodium 1 % Gel Commonly known as:  VOLTAREN   oxyCODONE-acetaminophen 5-325 MG tablet Commonly known as:  PERCOCET/ROXICET     TAKE these medications   cyclobenzaprine 10 MG tablet Commonly known as:  FLEXERIL Take 1 tablet (10 mg total) by mouth 3 (three) times daily as needed for muscle spasms.   docusate  sodium 100 MG capsule Commonly known as:  COLACE Take 1 capsule (100 mg total) by mouth 2 (two) times daily.   HYDROmorphone 2 MG tablet Commonly known as:  DILAUDID Take 1-2 tablets (2-4 mg total) by mouth every 4 (four) hours as needed for severe pain.   omeprazole 40 MG capsule Commonly known as:  PRILOSEC Take 40 mg by mouth daily as needed (for acid reflux/indigestion.).   Turmeric 500 MG Tabs Take 500 mg by mouth daily with breakfast.   Vitamin B-12 5000 MCG Subl Place 5,000 mcg under the tongue daily with breakfast.   Vitamin D3 2000 units Tabs Take 2,000 Units by mouth daily with breakfast.        Signed: Ophelia Charter 06/13/2017, 7:46 AM

## 2017-06-13 NOTE — Progress Notes (Signed)
Physical Therapy Treatment Patient Details Name: Elizabeth [name] MRN: [id number] DOB: Jul 20, 1945 Today's Date: 06/13/2017    History of Present Illness Pt is a 72 y/o female s/p L3-5 laminectomy. PMH includes HTN, CKD II, heart murmur, and aortic valve disorder.     PT Comments    Pt progressing towards physical therapy goals. Was able to perform transfers and ambulation with gross supervision for safety and [facility] for support. Pt completed stair training and was educated on precautions, car transfer, and safe activity progression Will continue to follow and progress as able per POC.   Follow Up Recommendations  No PT follow up;Supervision for mobility/OOB(would benefit from outpatien PT once cleared by MD)     Equipment Recommendations  3in1 (PT)    Recommendations for Other Services       Precautions / Restrictions Precautions Precautions: Back Precaution Booklet Issued: Yes (comment) Precaution Comments: Reviewed handout with pt and sister. Pt was cued for precautions during functional mobility.  Required Braces or Orthoses: (No brace per MD order) Restrictions Weight Bearing Restrictions: No    Mobility  Bed Mobility Overal bed mobility: Needs Assistance Bed Mobility: Rolling;Sidelying to Sit Rolling: Modified independent (Device/Increase time) Sidelying to sit: Supervision       General bed mobility comments: Pt was able to transition to EOB with no assistance. HOB flat and rails lowered to simulate home environment.   Transfers Overall transfer level: Needs assistance Equipment used: None Transfers: Sit to/from Stand Sit to Stand: Supervision         General transfer comment: Supervision for safety. No overt LOB noted however pt appears guarded.   Ambulation/Gait Ambulation/Gait assistance: Min assist;Supervision Ambulation Distance (Feet): 250 Feet Assistive device: 1 person hand held assist;Straight cane Gait Pattern/deviations: Step-through pattern;Decreased  stride length;Trendelenburg Gait velocity: Decreased  Gait velocity interpretation: Below normal speed for age/gender General Gait Details: Noted a significant trunk lean/trendelenberg gait without UE support. Significantly improved with HHA so gave [facility] and pt was able to demonstrate a much more fluid and natural gait pattern.    Stairs   Pt was cued for sequencing and general safety. She was provided HHA on the L and held to the right railing for support. Pt was able to negotiate 4 stairs without difficulty.           Wheelchair Mobility    Modified Rankin (Stroke Patients Only)       Balance Overall balance assessment: Needs assistance Sitting-balance support: No upper extremity supported;Feet supported Sitting balance-Leahy Scale: Good     Standing balance support: No upper extremity supported;Single extremity supported;During functional activity Standing balance-Leahy Scale: Fair Standing balance comment: Able to maintain static standing without UE support.                             Cognition Arousal/Alertness: Awake/alert Behavior During Therapy: WFL for tasks assessed/performed Overall Cognitive Status: Within Functional Limits for tasks assessed                                        Exercises      General Comments        Pertinent Vitals/Pain Pain Assessment: Faces Faces Pain Scale: Hurts a little bit Pain Location: back  Pain Descriptors / Indicators: Aching;Operative site guarding Pain Intervention(s): Monitored during session    Home Living Family/patient expects to be  discharged to:: Private residence Living Arrangements: Spouse/significant other Available Help at Discharge: Family;Available 24 hours/day Type of Home: House Home Access: Stairs to enter Entrance Stairs-Rails: Right;Left;Can reach both Home Layout: Other (Comment)(1 step into bedroom) Home Equipment: None      Prior Function Level of Independence:  Independent      Comments: ADLs, IADLs, driving, Ran a daycare. Decreased cleaning due to pain in BLEs   PT Goals (current goals can now be found in the care plan section) Acute Rehab PT Goals Patient Stated Goal: to go home PT Goal Formulation: With patient Time For Goal Achievement: 06/26/17 Potential to Achieve Goals: Good Progress towards PT goals: Progressing toward goals    Frequency    Min 5X/week      PT Plan Current plan remains appropriate    Co-evaluation              AM-PAC PT "6 Clicks" Daily Activity  Outcome Measure  Difficulty turning over in bed (including adjusting bedclothes, sheets and blankets)?: Unable Difficulty moving from lying on back to sitting on the side of the bed? : Unable Difficulty sitting down on and standing up from a chair with arms (e.g., wheelchair, bedside commode, etc,.)?: Unable Help needed moving to and from a bed to chair (including a wheelchair)?: A Little Help needed walking in hospital room?: A Little Help needed climbing 3-5 steps with a railing? : A Little 6 Click Score: 12    End of Session Equipment Utilized During Treatment: Gait belt Activity Tolerance: Patient tolerated treatment well Patient left: in chair;with call bell/phone within reach;with family/visitor present Nurse Communication: Mobility status PT Visit Diagnosis: Other abnormalities of gait and mobility (R26.89);Unsteadiness on feet (R26.81);Pain Pain - part of body: (back )     Time: 5993-5701 PT Time Calculation (min) (ACUTE ONLY): 29 min  Charges:  $Gait Training: 23-37 mins                    G Codes:       Rolinda Roan, PT, DPT Acute Rehabilitation Services Pager: [place] 06/13/2017, 12:00 PM

## 2017-06-13 NOTE — Progress Notes (Signed)
Pt doing well. Pt and family given D/C instructions with verbal understanding. Rx's were sent to Pt's pharmacy by MD. Pt's incision is clean and dry with no sign of infection. Pt's IV was removed prior to D/C. Pt received a cane from [facility] per MD order. Pt D/C'd home via wheelchair @ [phone] per MD order. Pt is stable @ D/C and has no other needs at this time. Holli Humbles, RN

## 2017-06-29 DIAGNOSIS — H25813 Combined forms of age-related cataract, bilateral: Secondary | ICD-10-CM | POA: Diagnosis not present

## 2017-08-15 DIAGNOSIS — Z1331 Encounter for screening for depression: Secondary | ICD-10-CM | POA: Diagnosis not present

## 2017-08-15 DIAGNOSIS — M2560 Stiffness of unspecified joint, not elsewhere classified: Secondary | ICD-10-CM | POA: Diagnosis not present

## 2017-08-15 DIAGNOSIS — Z139 Encounter for screening, unspecified: Secondary | ICD-10-CM | POA: Diagnosis not present

## 2017-08-15 DIAGNOSIS — M1712 Unilateral primary osteoarthritis, left knee: Secondary | ICD-10-CM | POA: Diagnosis not present

## 2017-08-15 DIAGNOSIS — I5030 Unspecified diastolic (congestive) heart failure: Secondary | ICD-10-CM | POA: Diagnosis not present

## 2017-09-06 DIAGNOSIS — G4700 Insomnia, unspecified: Secondary | ICD-10-CM | POA: Diagnosis not present

## 2017-09-06 DIAGNOSIS — D692 Other nonthrombocytopenic purpura: Secondary | ICD-10-CM | POA: Diagnosis not present

## 2017-09-06 DIAGNOSIS — R600 Localized edema: Secondary | ICD-10-CM | POA: Diagnosis not present

## 2017-09-12 DIAGNOSIS — G8929 Other chronic pain: Secondary | ICD-10-CM

## 2017-09-12 DIAGNOSIS — I5030 Unspecified diastolic (congestive) heart failure: Secondary | ICD-10-CM | POA: Diagnosis not present

## 2017-09-12 DIAGNOSIS — R110 Nausea: Secondary | ICD-10-CM | POA: Diagnosis not present

## 2017-09-12 DIAGNOSIS — M25562 Pain in left knee: Secondary | ICD-10-CM | POA: Diagnosis not present

## 2017-09-12 DIAGNOSIS — M25561 Pain in right knee: Secondary | ICD-10-CM | POA: Diagnosis not present

## 2017-09-12 DIAGNOSIS — M2550 Pain in unspecified joint: Secondary | ICD-10-CM | POA: Diagnosis not present

## 2017-09-12 HISTORY — DX: Other chronic pain: G89.29

## 2017-09-26 DIAGNOSIS — D692 Other nonthrombocytopenic purpura: Secondary | ICD-10-CM | POA: Diagnosis not present

## 2017-09-26 DIAGNOSIS — M159 Polyosteoarthritis, unspecified: Secondary | ICD-10-CM | POA: Diagnosis not present

## 2017-09-26 DIAGNOSIS — Z139 Encounter for screening, unspecified: Secondary | ICD-10-CM | POA: Diagnosis not present

## 2017-09-26 DIAGNOSIS — Z1231 Encounter for screening mammogram for malignant neoplasm of breast: Secondary | ICD-10-CM | POA: Diagnosis not present

## 2017-10-03 DIAGNOSIS — M25561 Pain in right knee: Secondary | ICD-10-CM | POA: Diagnosis not present

## 2017-10-03 DIAGNOSIS — M25562 Pain in left knee: Secondary | ICD-10-CM | POA: Diagnosis not present

## 2017-10-03 DIAGNOSIS — G8929 Other chronic pain: Secondary | ICD-10-CM | POA: Diagnosis not present

## 2017-10-11 DIAGNOSIS — N6320 Unspecified lump in the left breast, unspecified quadrant: Secondary | ICD-10-CM | POA: Diagnosis not present

## 2017-10-11 DIAGNOSIS — R928 Other abnormal and inconclusive findings on diagnostic imaging of breast: Secondary | ICD-10-CM | POA: Diagnosis not present

## 2017-10-11 DIAGNOSIS — N6321 Unspecified lump in the left breast, upper outer quadrant: Secondary | ICD-10-CM | POA: Diagnosis not present

## 2017-10-11 DIAGNOSIS — R922 Inconclusive mammogram: Secondary | ICD-10-CM | POA: Diagnosis not present

## 2017-10-12 ENCOUNTER — Other Ambulatory Visit: Payer: Self-pay | Admitting: Physician Assistant

## 2017-10-12 DIAGNOSIS — N6320 Unspecified lump in the left breast, unspecified quadrant: Secondary | ICD-10-CM

## 2017-10-12 HISTORY — PX: BREAST BIOPSY: SHX20

## 2017-10-23 ENCOUNTER — Ambulatory Visit
Admission: RE | Admit: 2017-10-23 | Discharge: 2017-10-23 | Disposition: A | Discharge status: Home / Self Care | Payer: Medicare Other | Source: Ambulatory Visit | Attending: Physician Assistant | Admitting: Physician Assistant

## 2017-10-23 DIAGNOSIS — N6320 Unspecified lump in the left breast, unspecified quadrant: Secondary | ICD-10-CM

## 2017-10-23 DIAGNOSIS — D0512 Intraductal carcinoma in situ of left breast: Secondary | ICD-10-CM | POA: Diagnosis not present

## 2017-10-23 DIAGNOSIS — N6321 Unspecified lump in the left breast, upper outer quadrant: Secondary | ICD-10-CM | POA: Diagnosis not present

## 2017-10-23 IMAGING — MG STEREOTACTIC CORE NEEDLE BIOPSY
8 of 15 series · 8 of 23 positions shown · non-contrast
Comparison: Previous exams.

ADDENDUM:
Pathology revealed INTERMEDIATE GRADE DUCTAL CARCINOMA IN SITU of
the Left breast, upper outer. This was found to be concordant by Dr.
PLACIDE.

Pathology results were discussed with the patient by telephone. The
patient reported doing well after the biopsy with tenderness at the
site. Post biopsy instructions and care were reviewed and questions
were answered. The patient was encouraged to call The [REDACTED]
Surgical consultation has been arranged with Dr. PLACIDE at
[REDACTED], per patient request, on [DATE].
Pathology results reported by PLACIDE, RN on [DATE].
CLINICAL DATA: Biopsy of left breast mass
EXAM:
LEFT BREAST STEREOTACTIC CORE NEEDLE BIOPSY

[L (1 of 8)]
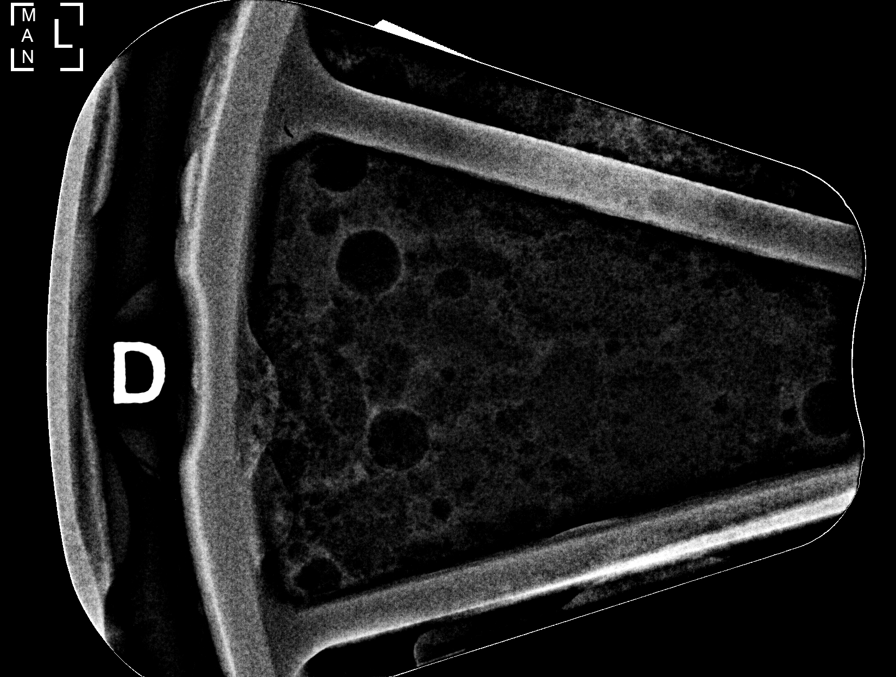

[L (2 of 8)]
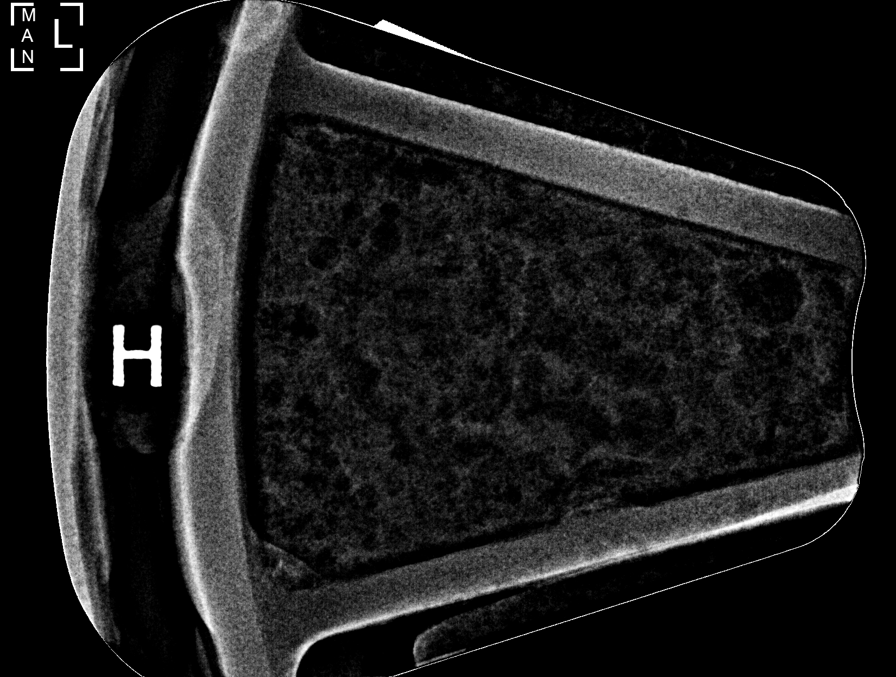

[L (3 of 8)]
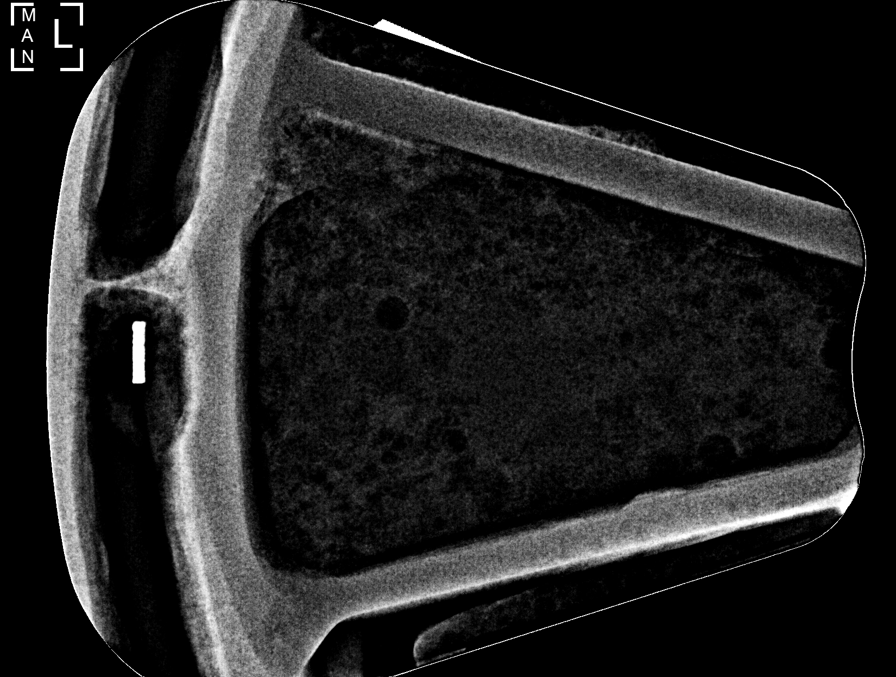

[L (4 of 8)]
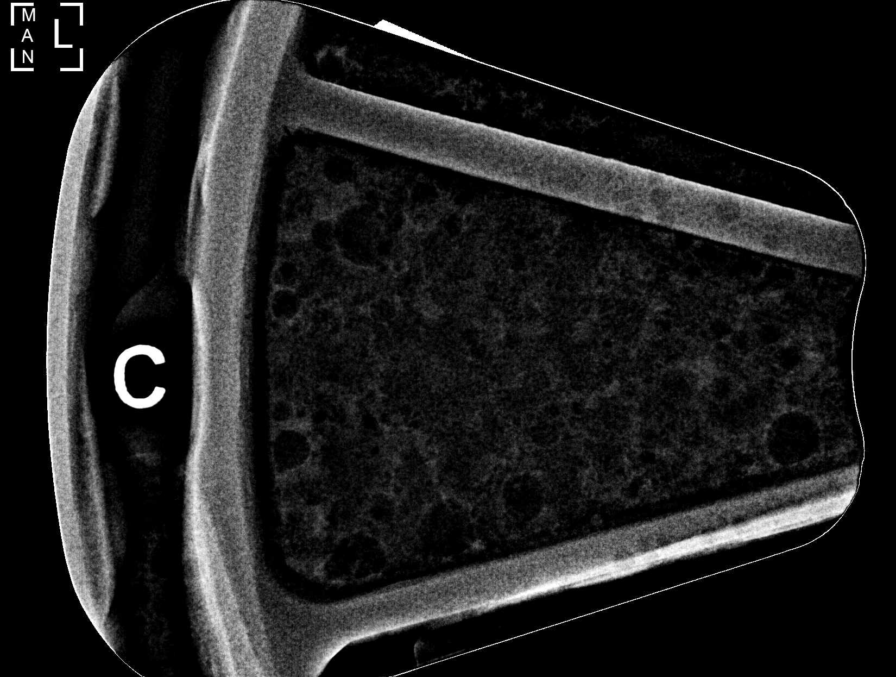

[L (5 of 8)]
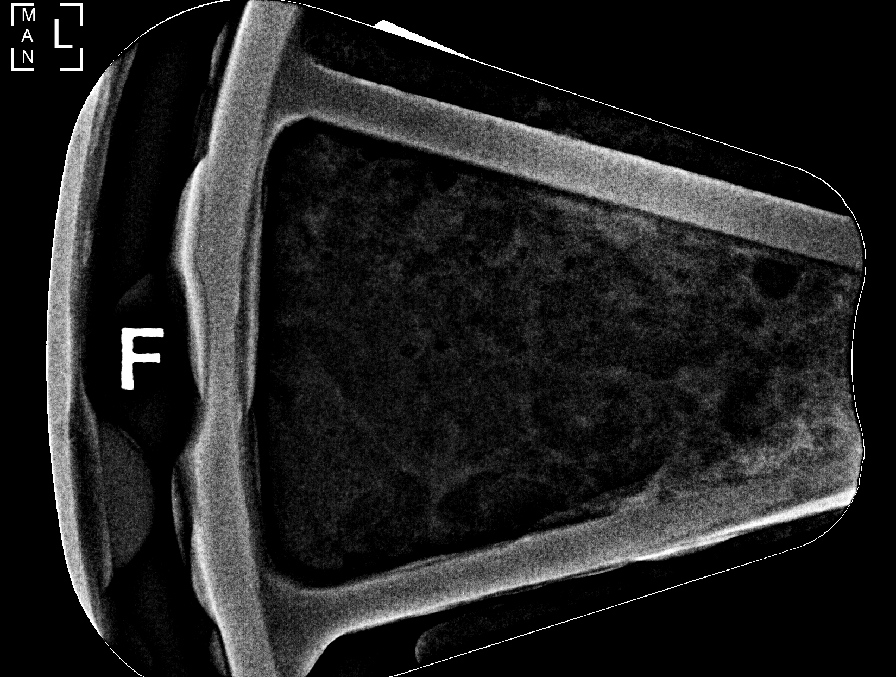

[L (6 of 8)]
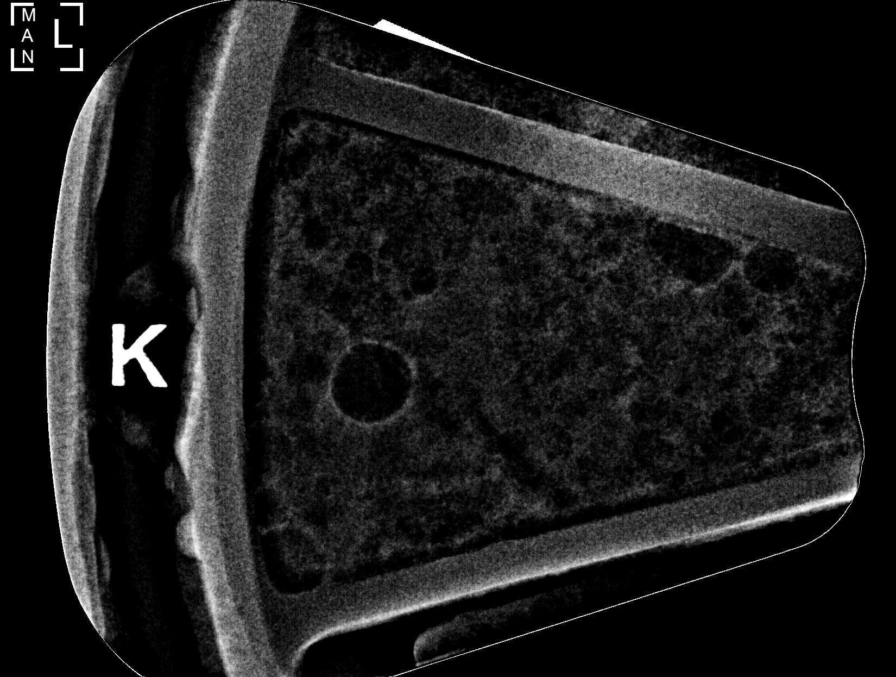

[L (7 of 8)]
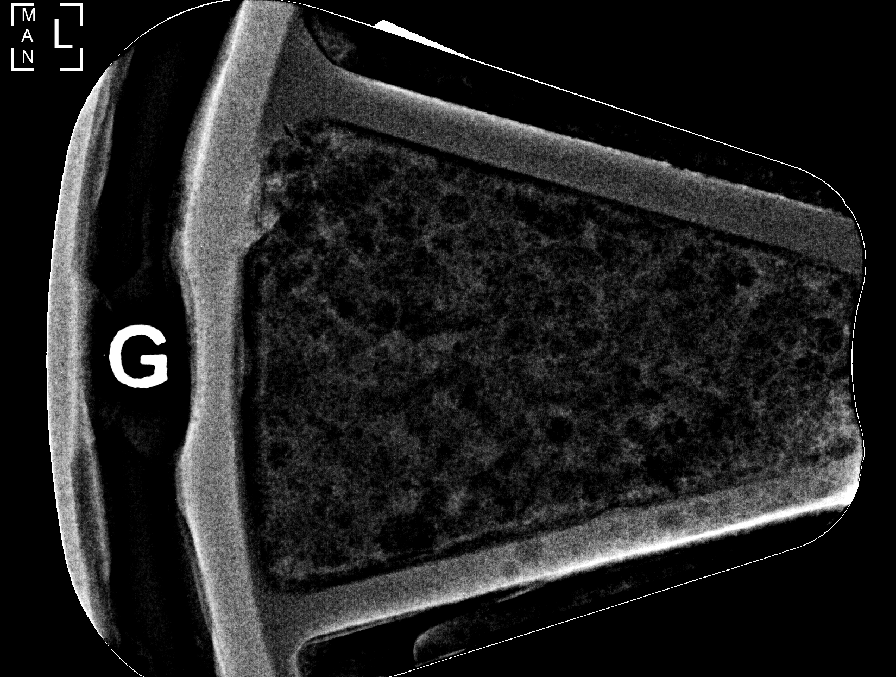

[L (8 of 8)]
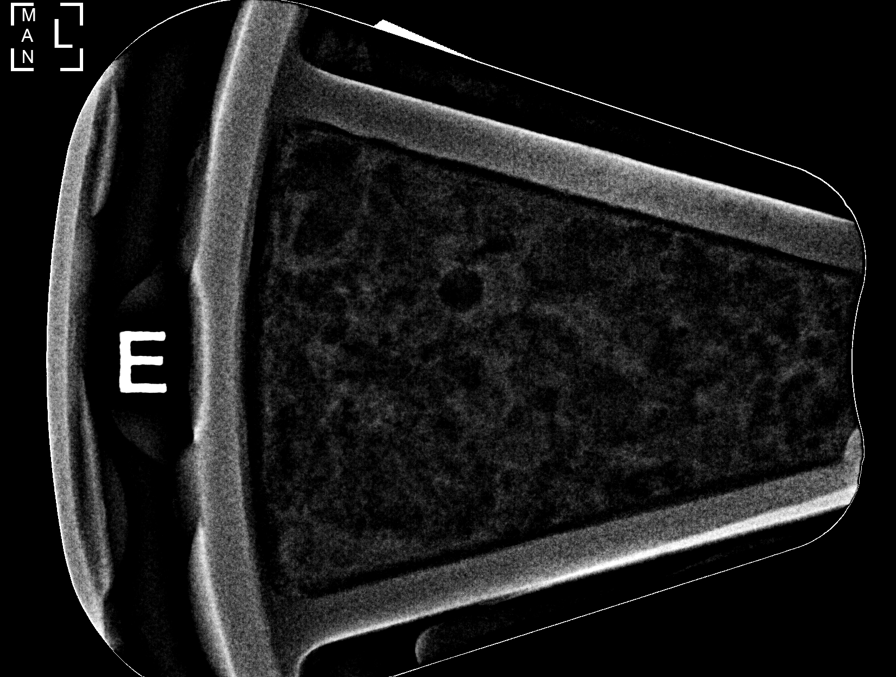

[8 of 23 positions shown; findings below may reference images not displayed]



Using sterile technique and 1% Lidocaine as local anesthetic, under
stereotactic guidance, a 9 gauge vacuum assisted device was used to
perform core needle biopsy of a mass in the upper outer left breast
using a superior approach.

Lesion quadrant: Upper-outer

At the conclusion of the procedure, a coil shaped tissue marker clip
was deployed into the biopsy cavity. Follow-up 2-view mammogram was
performed and dictated separately.
IMPRESSION: Stereotactic-guided biopsy of a left breast mass. No apparent
complications.

## 2017-10-23 IMAGING — MG MM CLIP PLACEMENT
4 series · 4 of 12 positions shown · non-contrast
Comparison: Previous exam(s).

CLINICAL DATA: Evaluate biopsy marker

EXAM:
DIAGNOSTIC LEFT MAMMOGRAM POST STEREOTACTIC BIOPSY

[L ML synth-2D]
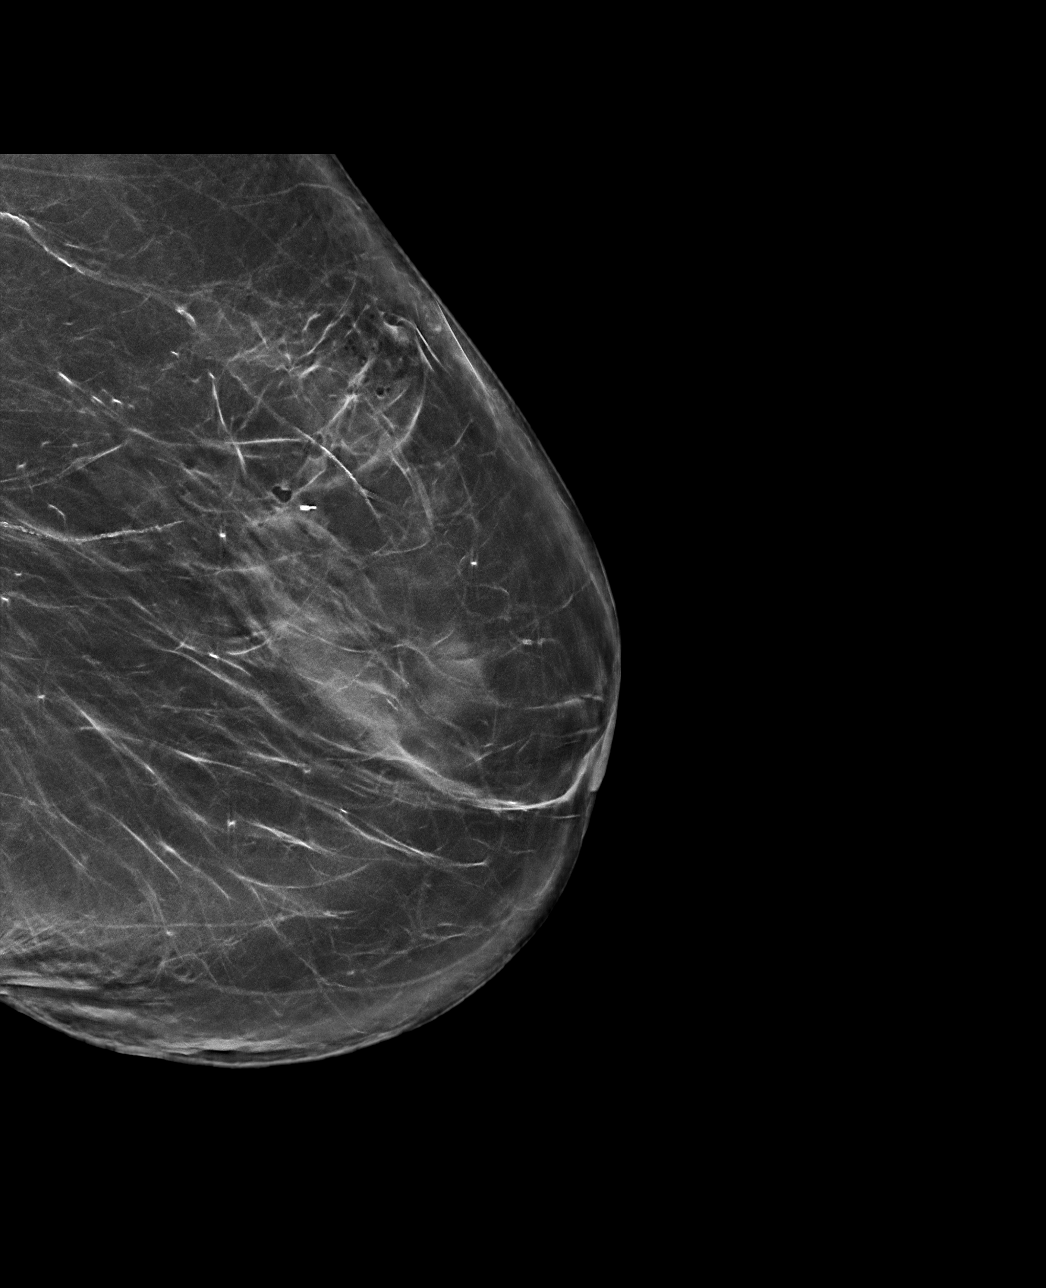

[L CC synth-2D]
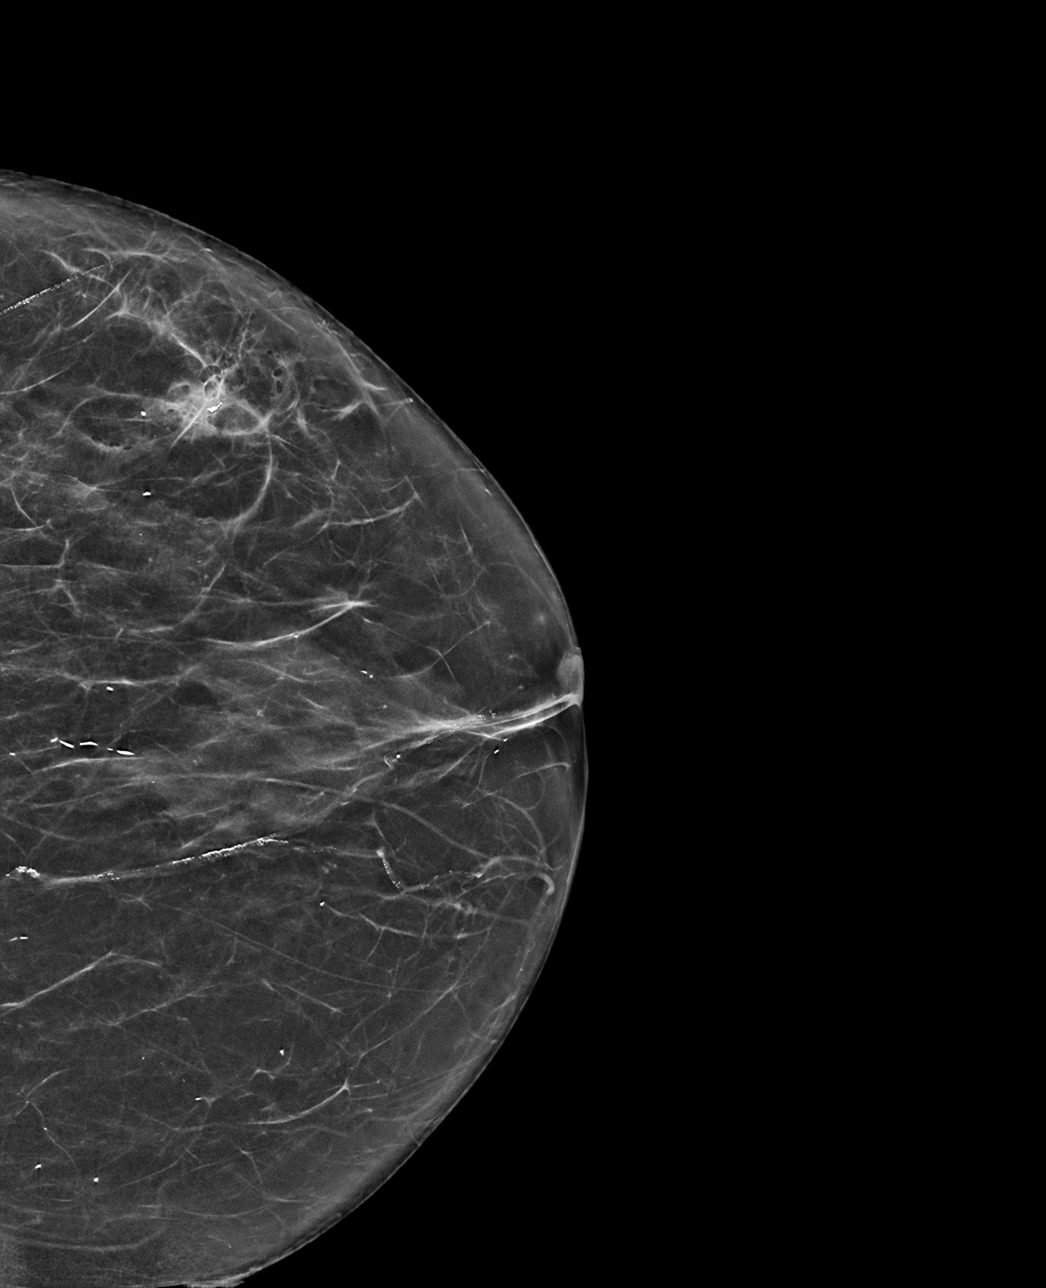

[L ML tomo · tomo slice 47/92.0]
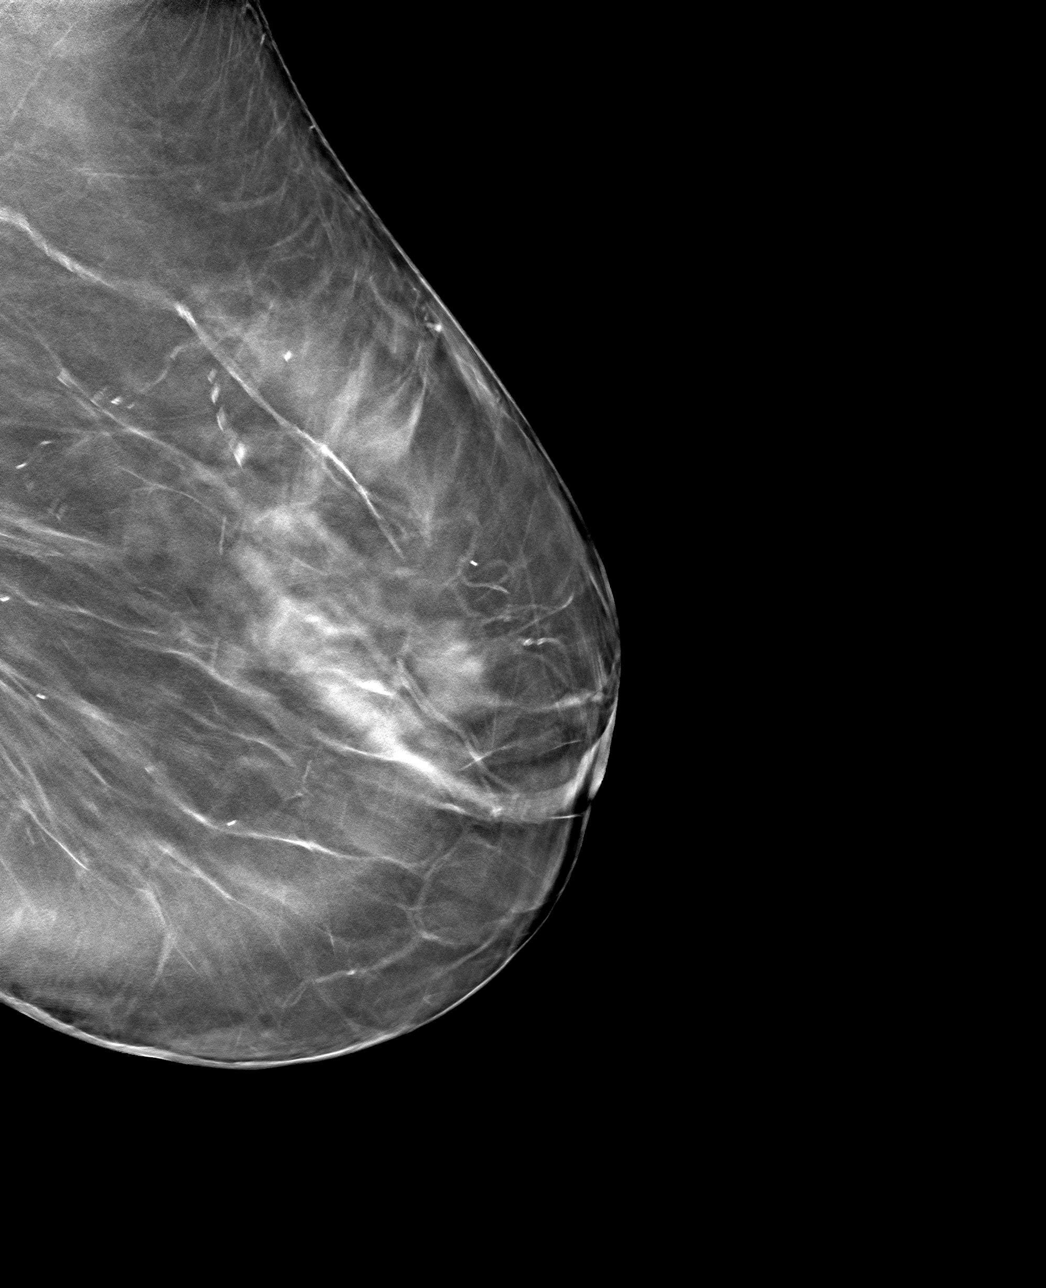

[L CC tomo · tomo slice 34/67.0]
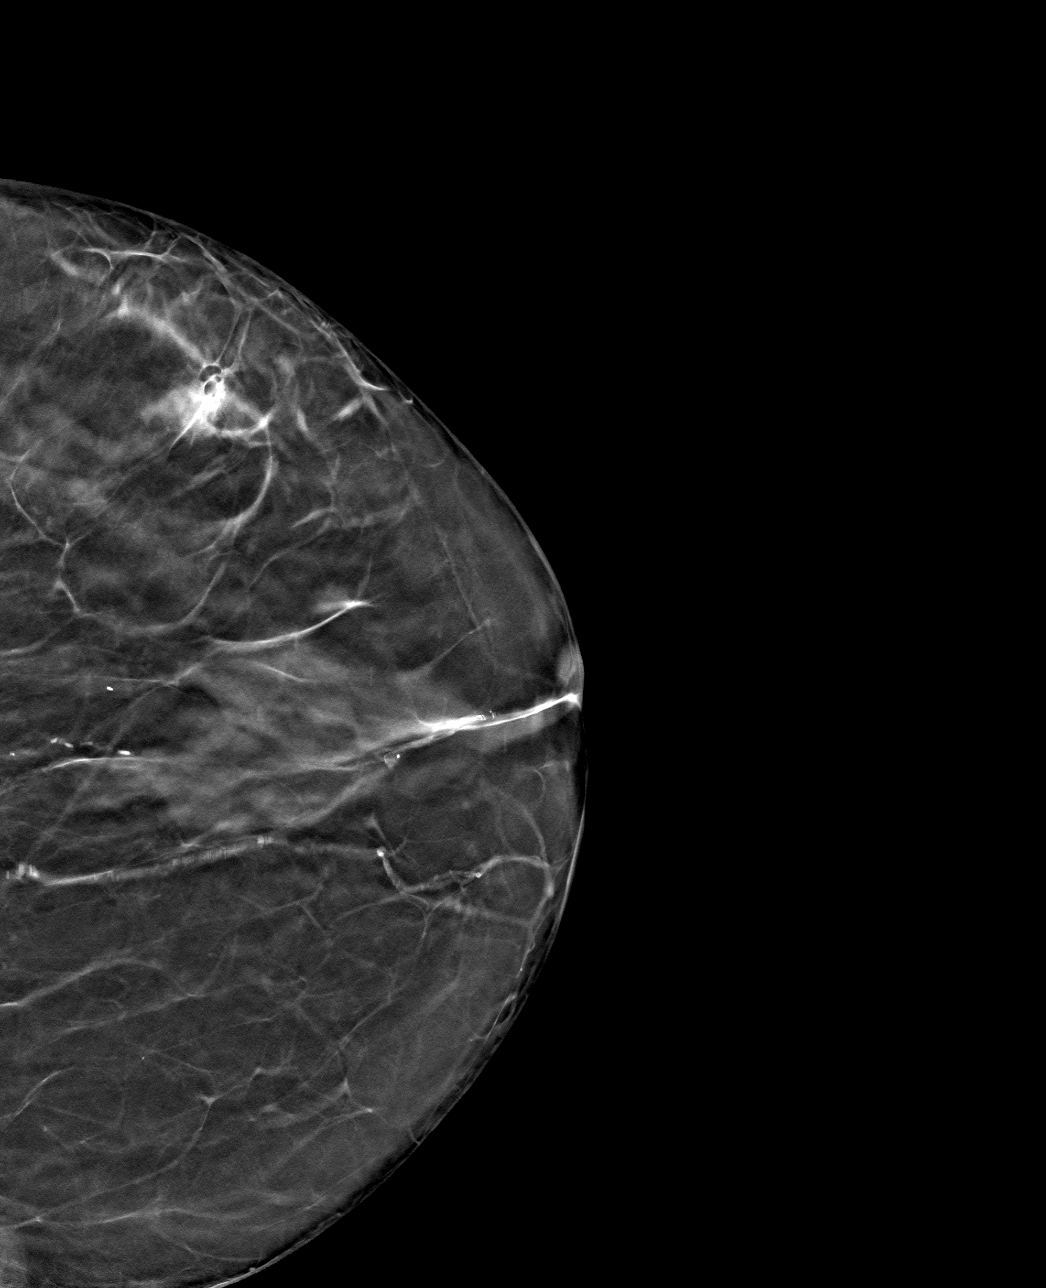

[4 of 12 positions shown; findings below may reference images not displayed]

FINDINGS: Mammographic images were obtained following stereotactic guided
biopsy of a left breast mass. A coil shaped clip is located at the
site of the biopsied mass.
IMPRESSION: Appropriate clip placement as above.

Final Assessment: Post Procedure Mammograms for Marker Placement

## 2017-10-29 ENCOUNTER — Encounter: Payer: Self-pay | Admitting: General Surgery

## 2017-10-31 DIAGNOSIS — D0512 Intraductal carcinoma in situ of left breast: Secondary | ICD-10-CM | POA: Diagnosis not present

## 2017-10-31 DIAGNOSIS — Z9049 Acquired absence of other specified parts of digestive tract: Secondary | ICD-10-CM | POA: Diagnosis not present

## 2017-10-31 DIAGNOSIS — R030 Elevated blood-pressure reading, without diagnosis of hypertension: Secondary | ICD-10-CM | POA: Diagnosis not present

## 2017-11-02 ENCOUNTER — Telehealth: Payer: Self-pay | Admitting: Hematology and Oncology

## 2017-11-02 NOTE — Telephone Encounter (Signed)
New patient breast referral received from Dr. Dalbert Batman to discuss COMET trial. Pt has been scheduled to see Dr. Lindi Adie on 8/28 at 215pm. Pt aware to arrive 30 minutes early. Address and location provided to the pt.

## 2017-11-07 DIAGNOSIS — M79672 Pain in left foot: Secondary | ICD-10-CM | POA: Diagnosis not present

## 2017-11-07 DIAGNOSIS — R252 Cramp and spasm: Secondary | ICD-10-CM | POA: Insufficient documentation

## 2017-11-07 DIAGNOSIS — M79671 Pain in right foot: Secondary | ICD-10-CM | POA: Diagnosis not present

## 2017-11-07 DIAGNOSIS — M25562 Pain in left knee: Secondary | ICD-10-CM | POA: Diagnosis not present

## 2017-11-07 DIAGNOSIS — G8929 Other chronic pain: Secondary | ICD-10-CM | POA: Diagnosis not present

## 2017-11-07 DIAGNOSIS — M25561 Pain in right knee: Secondary | ICD-10-CM | POA: Diagnosis not present

## 2017-11-08 ENCOUNTER — Telehealth: Payer: Self-pay | Admitting: Hematology and Oncology

## 2017-11-08 ENCOUNTER — Inpatient Hospital Stay: Payer: Medicare Other | Attending: Hematology and Oncology | Admitting: Hematology and Oncology

## 2017-11-08 DIAGNOSIS — E559 Vitamin D deficiency, unspecified: Secondary | ICD-10-CM | POA: Diagnosis not present

## 2017-11-08 DIAGNOSIS — K219 Gastro-esophageal reflux disease without esophagitis: Secondary | ICD-10-CM | POA: Diagnosis not present

## 2017-11-08 DIAGNOSIS — F419 Anxiety disorder, unspecified: Secondary | ICD-10-CM | POA: Insufficient documentation

## 2017-11-08 DIAGNOSIS — D0512 Intraductal carcinoma in situ of left breast: Secondary | ICD-10-CM

## 2017-11-08 DIAGNOSIS — M48061 Spinal stenosis, lumbar region without neurogenic claudication: Secondary | ICD-10-CM | POA: Diagnosis not present

## 2017-11-08 DIAGNOSIS — Z79899 Other long term (current) drug therapy: Secondary | ICD-10-CM | POA: Diagnosis not present

## 2017-11-08 DIAGNOSIS — Z79810 Long term (current) use of selective estrogen receptor modulators (SERMs): Secondary | ICD-10-CM | POA: Insufficient documentation

## 2017-11-08 DIAGNOSIS — N182 Chronic kidney disease, stage 2 (mild): Secondary | ICD-10-CM | POA: Insufficient documentation

## 2017-11-08 DIAGNOSIS — I129 Hypertensive chronic kidney disease with stage 1 through stage 4 chronic kidney disease, or unspecified chronic kidney disease: Secondary | ICD-10-CM | POA: Insufficient documentation

## 2017-11-08 DIAGNOSIS — Z170 Estrogen receptor positive status [ER+]: Secondary | ICD-10-CM | POA: Insufficient documentation

## 2017-11-08 HISTORY — DX: Intraductal carcinoma in situ of left breast: D05.12

## 2017-11-08 MED ORDER — TAMOXIFEN CITRATE 20 MG PO TABS: 20.0000 mg | ORAL_TABLET | Freq: Every day | ORAL | 3 refills | Status: DC

## 2017-11-08 NOTE — Telephone Encounter (Signed)
Gave avs and calendar ° °

## 2017-11-08 NOTE — Assessment & Plan Note (Signed)
10/23/2017: Screening detected 5 mm indeterminate mass left breast upper outer quadrant biopsy revealed intermediate grade DCIS ER 100%, PR 100%, Tis N0 stage 0  Pathology review: I discussed with the patient the difference between DCIS and invasive breast cancer. It is considered a precancerous lesion. DCIS is classified as a 0. It is generally detected through mammograms as calcifications. We discussed the significance of grades and its impact on prognosis. We also discussed the importance of ER and PR receptors and their implications to adjuvant treatment options. Prognosis of DCIS dependence on grade, comedo necrosis. It is anticipated that if not treated, 20-30% of DCIS can develop into invasive breast cancer.  Standard of care: Breast conserving surgery followed by radiation followed by antiestrogen therapy with tamoxifen.  Radiation can sometimes to be admitted in elderly patients.  Patient's preference: Patient would like to avoid surgery and is willing to take tamoxifen and stay on active surveillance.  She does not qualify for the Comet clinical trial because the description on the mammogram was that this was a mass.  Masses and exclusion criteria for the Comet clinical trial.  Patient prefers to go on active surveillance with mammograms every 6 months.  Current treatment: Tamoxifen 20 mg daily started 10/31/2017  We discussed the risks and benefits of tamoxifen. These include but not limited to insomnia, hot flashes, mood changes, vaginal dryness, and weight gain. Although rare, serious side effects including endometrial cancer, risk of blood clots were also discussed. We strongly believe that the benefits far outweigh the risks. Patient understands these risks and consented to starting treatment.    Tamoxifen counseling: We discussed the risks and benefits of tamoxifen. These include but not limited to insomnia, hot flashes, mood changes, vaginal dryness, and weight gain. Although rare,  serious side effects including endometrial cancer, risk of blood clots were also discussed. We strongly believe that the benefits far outweigh the risks. Patient understands these risks and consented to starting treatment. Planned treatment duration is 5 years.  Return to clinic in 3 months to assess tolerability to tamoxifen.

## 2017-11-08 NOTE — Progress Notes (Signed)
[name] CONSULT NOTE  Patient Care Team: Marco Collie, MD as PCP - General (Family Medicine)  CHIEF COMPLAINTS/PURPOSE OF CONSULTATION:  Newly diagnosed left breast DCIS  HISTORY OF PRESENTING ILLNESS:  Elizabeth [name] 72 y.o. female is here because of recent diagnosis of left breast DCIS.  Patient had a screening mammogram that detected a 5 mm indeterminate mass.  This was evaluated by ultrasound and a biopsy was performed which came back as intermediate grade DCIS that is ER PR positive.  She was seen by surgery who referred her to [place] for discussion regarding Comet clinical trial.  Patient is here accompanied by her granddaughter.  She does not want to undergo surgery at this time.  She is also very much against medications but she is willing to take tamoxifen and follow-up closely for surveillance.  I reviewed her records extensively and collaborated the history with the patient.  SUMMARY OF ONCOLOGIC HISTORY:   Ductal carcinoma in situ (DCIS) of left breast   10/23/2017 Initial Diagnosis    Screening detected 5 mm indeterminate mass left breast upper outer quadrant biopsy revealed intermediate grade DCIS ER 100%, PR 100%, Tis N0 stage 0    11/08/2017 -  Anti-estrogen oral therapy    Tamoxifen as part of active surveillance because the patient does not want to undergo surgery at this time.      MEDICAL HISTORY:  Past Medical History:  Diagnosis Date  . Anxiety    extreme  . Aortic valve disorder   . Benign hypertension with CKD (chronic kidney disease), stage II   . Claustrophobia   . Edema   . GERD (gastroesophageal reflux disease)   . Heart murmur   . Hyperuricemia   . Lumbar stenosis   . Osteoarthrosis   . Vitamin D deficiency     SURGICAL HISTORY: Past Surgical History:  Procedure Laterality Date  . ABDOMINAL HYSTERECTOMY    . APPENDECTOMY    . GALLBLADDER SURGERY    . LUMBAR LAMINECTOMY/DECOMPRESSION MICRODISCECTOMY N/A 06/12/2017   Procedure:  LAMINECTOMY AND FORAMINOTOMY LUMBAR THREE- LUMBAR FOUR, LUMBAR FOUR- LUMBAR FIVE;  Surgeon: Newman Pies, MD;  Location: [place];  Service: Neurosurgery;  Laterality: N/A;    SOCIAL HISTORY: Social History   Socioeconomic History  . Marital status: Married    Spouse name: Not on file  . Number of children: 2  . Years of education: 9  . Highest education level: Not on file  Occupational History  . Occupation: child care  Social Needs  . Financial resource strain: Not on file  . Food insecurity:    Worry: Not on file    Inability: Not on file  . Transportation needs:    Medical: Not on file    Non-medical: Not on file  Tobacco Use  . Smoking status: Never Smoker  . Smokeless tobacco: Never Used  Substance and Sexual Activity  . Alcohol use: No    Frequency: Never  . Drug use: No  . Sexual activity: Not on file  Lifestyle  . Physical activity:    Days per week: Not on file    Minutes per session: Not on file  . Stress: Not on file  Relationships  . Social connections:    Talks on phone: Not on file    Gets together: Not on file    Attends religious service: Not on file    Active member of club or organization: Not on file    Attends meetings of clubs  or organizations: Not on file    Relationship status: Not on file  . Intimate partner violence:    Fear of current or ex partner: Not on file    Emotionally abused: Not on file    Physically abused: Not on file    Forced sexual activity: Not on file  Other Topics Concern  . Not on file  Social History Narrative   Lives with husband in a one story home.  Has 2 children.  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  Education: high school.      FAMILY HISTORY: No family history on file.  ALLERGIES:  is allergic to codeine; hydrocodone; lipitor [atorvastatin]; penicillins; and prednisone.  MEDICATIONS:  Current Outpatient Medications  Medication Sig Dispense Refill  . Cholecalciferol (VITAMIN D3) 2000 units TABS Take 2,000  Units by mouth daily with breakfast.    . Cyanocobalamin (VITAMIN B-12) 5000 MCG SUBL Place 5,000 mcg under the tongue daily with breakfast.    . cyclobenzaprine (FLEXERIL) 10 MG tablet Take 1 tablet (10 mg total) by mouth 3 (three) times daily as needed for muscle spasms. 50 tablet 1  . docusate sodium (COLACE) 100 MG capsule Take 1 capsule (100 mg total) by mouth 2 (two) times daily. 60 capsule 0  . HYDROmorphone (DILAUDID) 2 MG tablet Take 1-2 tablets (2-4 mg total) by mouth every 4 (four) hours as needed for severe pain. 50 tablet 0  . omeprazole (PRILOSEC) 40 MG capsule Take 40 mg by mouth daily as needed (for acid reflux/indigestion.).    Marland Kitchen tamoxifen (NOLVADEX) 20 MG tablet Take 1 tablet (20 mg total) by mouth daily. 90 tablet 3  . Turmeric 500 MG TABS Take 500 mg by mouth daily with breakfast.     No current facility-administered medications for this visit.     REVIEW OF SYSTEMS:   Constitutional: Denies fevers, chills or abnormal night sweats Eyes: Denies blurriness of vision, double vision or watery eyes Ears, nose, mouth, throat, and face: Denies mucositis or sore throat Respiratory: Denies cough, dyspnea or wheezes Cardiovascular: Denies palpitation, chest discomfort or lower extremity swelling Gastrointestinal:  Denies nausea, heartburn or change in bowel habits Skin: Denies abnormal skin rashes Lymphatics: Denies new lymphadenopathy or easy bruising Neurological:Denies numbness, tingling or new weaknesses Behavioral/Psych: Extremely anxious and nervous Breast:  Denies any palpable lumps or discharge All other systems were reviewed with the patient and are negative.  PHYSICAL EXAMINATION: ECOG PERFORMANCE STATUS: 0 - Asymptomatic  Vitals:   11/08/17 1428  BP: (!) 172/77  Pulse: 74  Resp: 16  Temp: 98 F (36.7 C)  SpO2: 100%   Filed Weights   11/08/17 1428  Weight: 258 lb 12.8 oz (117.4 kg)    GENERAL:alert, no distress and comfortable SKIN: skin color, texture,  turgor are normal, no rashes or significant lesions EYES: normal, conjunctiva are pink and non-injected, sclera clear OROPHARYNX:no exudate, no erythema and lips, buccal mucosa, and tongue normal  NECK: supple, thyroid normal size, non-tender, without nodularity LYMPH:  no palpable lymphadenopathy in the cervical, axillary or inguinal LUNGS: clear to auscultation and percussion with normal breathing effort HEART: regular rate & rhythm and no murmurs and no lower extremity edema ABDOMEN:abdomen soft, non-tender and normal bowel sounds Musculoskeletal:no cyanosis of digits and no clubbing  PSYCH: alert & oriented x 3 with fluent speech NEURO: no focal motor/sensory deficits BREAST: No palpable nodules in breast. No palpable axillary or supraclavicular lymphadenopathy (exam performed in the presence of a chaperone)   LABORATORY DATA:  I have reviewed the data as listed Lab Results  Component Value Date   WBC 7.5 06/05/2017   HGB 13.1 06/05/2017   HCT 39.7 06/05/2017   MCV 90.0 06/05/2017   PLT 180 06/05/2017   Lab Results  Component Value Date   NA 143 06/05/2017   K 4.1 06/05/2017   CL 112 (H) 06/05/2017   CO2 23 06/05/2017    RADIOGRAPHIC STUDIES: I have personally reviewed the radiological reports and agreed with the findings in the report.  ASSESSMENT AND PLAN:  Ductal carcinoma in situ (DCIS) of left breast 10/23/2017: Screening detected 5 mm indeterminate mass left breast upper outer quadrant biopsy revealed intermediate grade DCIS ER 100%, PR 100%, Tis N0 stage 0  Pathology review: I discussed with the patient the difference between DCIS and invasive breast cancer. It is considered a precancerous lesion. DCIS is classified as a 0. It is generally detected through mammograms as calcifications. We discussed the significance of grades and its impact on prognosis. We also discussed the importance of ER and PR receptors and their implications to adjuvant treatment options.  Prognosis of DCIS dependence on grade, comedo necrosis. It is anticipated that if not treated, 20-30% of DCIS can develop into invasive breast cancer.  Standard of care: Breast conserving surgery followed by radiation followed by antiestrogen therapy with tamoxifen.  Radiation can sometimes to be admitted in elderly patients.  Patient's preference: Patient would like to avoid surgery and is willing to take tamoxifen and stay on active surveillance.  She does not qualify for the Comet clinical trial because the description on the mammogram was that this was a mass.  Masses and exclusion criteria for the Comet clinical trial.  Patient prefers to go on active surveillance with mammograms every 6 months.  Current treatment: Tamoxifen 20 mg daily started 10/31/2017  We discussed the risks and benefits of tamoxifen. These include but not limited to insomnia, hot flashes, mood changes, vaginal dryness, and weight gain. Although rare, serious side effects including endometrial cancer, risk of blood clots were also discussed. We strongly believe that the benefits far outweigh the risks. Patient understands these risks and consented to starting treatment.    Tamoxifen counseling: We discussed the risks and benefits of tamoxifen. These include but not limited to insomnia, hot flashes, mood changes, vaginal dryness, and weight gain. Although rare, serious side effects including endometrial cancer, risk of blood clots were also discussed. We strongly believe that the benefits far outweigh the risks. Patient understands these risks and consented to starting treatment. Planned treatment duration is 5 years.  Return to clinic in 3 months to assess tolerability to tamoxifen.   All questions were answered. The patient knows to call the clinic with any problems, questions or concerns.    Harriette Ohara, MD 11/08/17

## 2017-11-28 DIAGNOSIS — Z9889 Other specified postprocedural states: Secondary | ICD-10-CM | POA: Diagnosis not present

## 2017-11-28 DIAGNOSIS — M5416 Radiculopathy, lumbar region: Secondary | ICD-10-CM

## 2017-11-28 HISTORY — DX: Radiculopathy, lumbar region: M54.16

## 2017-12-13 DIAGNOSIS — G5761 Lesion of plantar nerve, right lower limb: Secondary | ICD-10-CM | POA: Diagnosis not present

## 2017-12-13 DIAGNOSIS — Z23 Encounter for immunization: Secondary | ICD-10-CM | POA: Diagnosis not present

## 2017-12-13 DIAGNOSIS — Q253 Supravalvular aortic stenosis: Secondary | ICD-10-CM | POA: Diagnosis not present

## 2017-12-13 DIAGNOSIS — R600 Localized edema: Secondary | ICD-10-CM | POA: Diagnosis not present

## 2017-12-19 DIAGNOSIS — M5136 Other intervertebral disc degeneration, lumbar region: Secondary | ICD-10-CM | POA: Diagnosis not present

## 2017-12-19 DIAGNOSIS — M150 Primary generalized (osteo)arthritis: Secondary | ICD-10-CM | POA: Diagnosis not present

## 2017-12-19 DIAGNOSIS — G8929 Other chronic pain: Secondary | ICD-10-CM | POA: Diagnosis not present

## 2017-12-19 DIAGNOSIS — Z9889 Other specified postprocedural states: Secondary | ICD-10-CM | POA: Diagnosis not present

## 2017-12-19 DIAGNOSIS — M5416 Radiculopathy, lumbar region: Secondary | ICD-10-CM | POA: Diagnosis not present

## 2017-12-27 DIAGNOSIS — E782 Mixed hyperlipidemia: Secondary | ICD-10-CM | POA: Diagnosis not present

## 2017-12-27 DIAGNOSIS — R141 Gas pain: Secondary | ICD-10-CM | POA: Diagnosis not present

## 2017-12-27 DIAGNOSIS — E790 Hyperuricemia without signs of inflammatory arthritis and tophaceous disease: Secondary | ICD-10-CM | POA: Diagnosis not present

## 2017-12-27 DIAGNOSIS — R7982 Elevated C-reactive protein (CRP): Secondary | ICD-10-CM | POA: Diagnosis not present

## 2017-12-27 DIAGNOSIS — E559 Vitamin D deficiency, unspecified: Secondary | ICD-10-CM | POA: Diagnosis not present

## 2017-12-29 ENCOUNTER — Telehealth: Payer: Self-pay | Admitting: Hematology and Oncology

## 2017-12-29 NOTE — Telephone Encounter (Signed)
[facility] PAL 11/27 - moved appointments to 11/25. Left message. Schedule mailed.

## 2018-01-08 ENCOUNTER — Telehealth: Payer: Self-pay | Admitting: Hematology and Oncology

## 2018-01-08 NOTE — Telephone Encounter (Signed)
Tried to call  Regarding voicemail patient phone kept ringing

## 2018-01-10 DIAGNOSIS — E782 Mixed hyperlipidemia: Secondary | ICD-10-CM | POA: Diagnosis not present

## 2018-01-10 DIAGNOSIS — E790 Hyperuricemia without signs of inflammatory arthritis and tophaceous disease: Secondary | ICD-10-CM | POA: Diagnosis not present

## 2018-01-10 DIAGNOSIS — I129 Hypertensive chronic kidney disease with stage 1 through stage 4 chronic kidney disease, or unspecified chronic kidney disease: Secondary | ICD-10-CM | POA: Diagnosis not present

## 2018-01-10 DIAGNOSIS — N182 Chronic kidney disease, stage 2 (mild): Secondary | ICD-10-CM | POA: Diagnosis not present

## 2018-01-16 DIAGNOSIS — M150 Primary generalized (osteo)arthritis: Secondary | ICD-10-CM | POA: Diagnosis not present

## 2018-01-16 DIAGNOSIS — M5136 Other intervertebral disc degeneration, lumbar region: Secondary | ICD-10-CM | POA: Diagnosis not present

## 2018-01-16 DIAGNOSIS — M25561 Pain in right knee: Secondary | ICD-10-CM | POA: Diagnosis not present

## 2018-01-16 DIAGNOSIS — M25562 Pain in left knee: Secondary | ICD-10-CM | POA: Diagnosis not present

## 2018-01-16 DIAGNOSIS — G8929 Other chronic pain: Secondary | ICD-10-CM | POA: Diagnosis not present

## 2018-02-05 ENCOUNTER — Inpatient Hospital Stay: Payer: Medicare Other | Attending: Hematology and Oncology | Admitting: Hematology and Oncology

## 2018-02-05 ENCOUNTER — Telehealth: Payer: Self-pay | Admitting: Hematology and Oncology

## 2018-02-05 DIAGNOSIS — G8929 Other chronic pain: Secondary | ICD-10-CM | POA: Diagnosis not present

## 2018-02-05 DIAGNOSIS — Z170 Estrogen receptor positive status [ER+]: Secondary | ICD-10-CM | POA: Diagnosis not present

## 2018-02-05 DIAGNOSIS — Z79818 Long term (current) use of other agents affecting estrogen receptors and estrogen levels: Secondary | ICD-10-CM | POA: Diagnosis not present

## 2018-02-05 DIAGNOSIS — M79662 Pain in left lower leg: Secondary | ICD-10-CM | POA: Diagnosis not present

## 2018-02-05 DIAGNOSIS — M79661 Pain in right lower leg: Secondary | ICD-10-CM

## 2018-02-05 DIAGNOSIS — D0512 Intraductal carcinoma in situ of left breast: Secondary | ICD-10-CM | POA: Diagnosis not present

## 2018-02-05 DIAGNOSIS — Z79899 Other long term (current) drug therapy: Secondary | ICD-10-CM | POA: Insufficient documentation

## 2018-02-05 NOTE — Progress Notes (Signed)
Patient Care Team: Marco Collie, MD as PCP - General (Family Medicine)  DIAGNOSIS:  Encounter Diagnosis  Name Primary?  . Ductal carcinoma in situ (DCIS) of left breast     SUMMARY OF ONCOLOGIC HISTORY:   Ductal carcinoma in situ (DCIS) of left breast   10/23/2017 Initial Diagnosis    Screening detected 5 mm indeterminate mass left breast upper outer quadrant biopsy revealed intermediate grade DCIS ER 100%, PR 100%, Tis N0 stage 0    11/08/2017 -  Anti-estrogen oral therapy    Tamoxifen as part of active surveillance because the patient does not want to undergo surgery at this time.     CHIEF COMPLIANT: Follow-up on tamoxifen therapy  INTERVAL HISTORY: Elizabeth [name] is a 72 year old with above-mentioned history left breast DCIS who refused to undergo surgery and is now on active surveillance.  She has been on tamoxifen for DCIS and appears to be tolerating it extremely well.  Her major complaint is related to throbbing below the knee and bilateral lower extremities which started even before tamoxifen.  REVIEW OF SYSTEMS:   Constitutional: Denies fevers, chills or abnormal weight loss Eyes: Denies blurriness of vision Ears, nose, mouth, throat, and face: Denies mucositis or sore throat Respiratory: Denies cough, dyspnea or wheezes Cardiovascular: Denies palpitation, chest discomfort Gastrointestinal:  Denies nausea, heartburn or change in bowel habits Skin: Denies abnormal skin rashes Lymphatics: Denies new lymphadenopathy or easy bruising Neurological:Denies numbness, tingling or new weaknesses Behavioral/Psych: Mood is stable, no new changes  Extremities: No lower extremity edema Breast: One episode of pain in the left breast that lasted for 2 to 3 days All other systems were reviewed with the patient and are negative.  I have reviewed the past medical history, past surgical history, social history and family history with the patient and they are unchanged from previous  note.  ALLERGIES:  is allergic to codeine; hydrocodone; lipitor [atorvastatin]; penicillins; and prednisone.  MEDICATIONS:  Current Outpatient Medications  Medication Sig Dispense Refill  . Cholecalciferol (VITAMIN D3) 2000 units TABS Take 2,000 Units by mouth daily with breakfast.    . Cyanocobalamin (VITAMIN B-12) 5000 MCG SUBL Place 5,000 mcg under the tongue daily with breakfast.    . omeprazole (PRILOSEC) 40 MG capsule Take 40 mg by mouth daily as needed (for acid reflux/indigestion.).    Marland Kitchen tamoxifen (NOLVADEX) 20 MG tablet Take 1 tablet (20 mg total) by mouth daily. 90 tablet 3   No current facility-administered medications for this visit.     PHYSICAL EXAMINATION: ECOG PERFORMANCE STATUS: 1 - Symptomatic but completely ambulatory  Vitals:   02/05/18 0906  BP: (!) 143/98  Pulse: 95  Resp: 17  Temp: 98.2 F (36.8 C)  SpO2: 100%   Filed Weights   02/05/18 0906  Weight: 260 lb 14.4 oz (118.3 kg)    GENERAL:alert, no distress and comfortable SKIN: skin color, texture, turgor are normal, no rashes or significant lesions EYES: normal, Conjunctiva are pink and non-injected, sclera clear OROPHARYNX:no exudate, no erythema and lips, buccal mucosa, and tongue normal  NECK: supple, thyroid normal size, non-tender, without nodularity LYMPH:  no palpable lymphadenopathy in the cervical, axillary or inguinal LUNGS: clear to auscultation and percussion with normal breathing effort HEART: regular rate & rhythm and no murmurs and no lower extremity edema ABDOMEN:abdomen soft, non-tender and normal bowel sounds MUSCULOSKELETAL:no cyanosis of digits and no clubbing  NEURO: alert & oriented x 3 with fluent speech, no focal motor/sensory deficits EXTREMITIES: No lower extremity edema  LABORATORY DATA:  I have reviewed the data as listed CMP Latest Ref Rng & Units 06/05/2017  Glucose 65 - 99 mg/dL 81  BUN 6 - 20 mg/dL 18  Creatinine 0.44 - 1.00 mg/dL 0.80  Sodium 135 - 145 mmol/L  143  Potassium 3.5 - 5.1 mmol/L 4.1  Chloride 101 - 111 mmol/L 112(H)  CO2 22 - 32 mmol/L 23  Calcium 8.9 - 10.3 mg/dL 9.3    Lab Results  Component Value Date   WBC 7.5 06/05/2017   HGB 13.1 06/05/2017   HCT 39.7 06/05/2017   MCV 90.0 06/05/2017   PLT 180 06/05/2017    ASSESSMENT & PLAN:  Ductal carcinoma in situ (DCIS) of left breast 10/23/2017: Screening detected 5 mm indeterminate mass left breast upper outer quadrant biopsy revealed intermediate grade DCIS ER 100%, PR 100%, Tis N0 stage 0  Treatment plan: Active surveillance with tamoxifen and mammograms every 6 months.  Patient decided not to undergo surgery. Current treatment: Tamoxifen 20 mg daily started 10/31/2017  Tamoxifen toxicities: Denies any hot flashes or myalgias.  Lower extremity throbbing pain due to prior chronic causes.  It is unclear as etiology. Return to clinic in 3 months after mammograms and follow-up    Orders Placed This Encounter  Procedures  . MM DIAG BREAST TOMO UNI LEFT    Standing Status:   Future    Standing Expiration Date:   02/06/2019    Order Specific Question:   Reason for Exam (SYMPTOM  OR DIAGNOSIS REQUIRED)    Answer:   Left breast mammogram for DCIS. Patient did not undergo surgery    Order Specific Question:   Preferred imaging location?    Answer:   [facility]   The patient has a good understanding of the overall plan. she agrees with it. she will call with any problems that may develop before the next visit here.   Harriette Ohara, MD 02/05/18

## 2018-02-05 NOTE — Telephone Encounter (Signed)
Gave avs and calendar ° °

## 2018-02-05 NOTE — Assessment & Plan Note (Signed)
10/23/2017: Screening detected 5 mm indeterminate mass left breast upper outer quadrant biopsy revealed intermediate grade DCIS ER 100%, PR 100%, Tis N0 stage 0  Treatment plan: Active surveillance with tamoxifen and mammograms every 6 months.  Patient decided not to undergo surgery. Current treatment: Tamoxifen 20 mg daily started 10/31/2017  Tamoxifen toxicities:  Return to clinic in 3 months after mammograms and follow-up

## 2018-02-09 ENCOUNTER — Ambulatory Visit: Payer: Medicare Other | Admitting: Hematology and Oncology

## 2018-02-13 DIAGNOSIS — M1712 Unilateral primary osteoarthritis, left knee: Secondary | ICD-10-CM | POA: Diagnosis not present

## 2018-02-13 DIAGNOSIS — M48062 Spinal stenosis, lumbar region with neurogenic claudication: Secondary | ICD-10-CM | POA: Diagnosis not present

## 2018-02-21 DIAGNOSIS — M1712 Unilateral primary osteoarthritis, left knee: Secondary | ICD-10-CM | POA: Diagnosis not present

## 2018-02-28 DIAGNOSIS — Z139 Encounter for screening, unspecified: Secondary | ICD-10-CM | POA: Diagnosis not present

## 2018-02-28 DIAGNOSIS — M159 Polyosteoarthritis, unspecified: Secondary | ICD-10-CM | POA: Diagnosis not present

## 2018-03-20 DIAGNOSIS — M5416 Radiculopathy, lumbar region: Secondary | ICD-10-CM | POA: Diagnosis not present

## 2018-04-11 DIAGNOSIS — Z139 Encounter for screening, unspecified: Secondary | ICD-10-CM | POA: Diagnosis not present

## 2018-04-11 DIAGNOSIS — M5430 Sciatica, unspecified side: Secondary | ICD-10-CM | POA: Diagnosis not present

## 2018-05-08 ENCOUNTER — Ambulatory Visit
Admission: RE | Admit: 2018-05-08 | Discharge: 2018-05-08 | Disposition: A | Discharge status: Home / Self Care | Payer: Medicare Other | Source: Ambulatory Visit | Attending: Hematology and Oncology | Admitting: Hematology and Oncology

## 2018-05-08 ENCOUNTER — Other Ambulatory Visit: Payer: Self-pay | Admitting: Hematology and Oncology

## 2018-05-08 DIAGNOSIS — D0512 Intraductal carcinoma in situ of left breast: Secondary | ICD-10-CM | POA: Diagnosis not present

## 2018-05-08 IMAGING — MG DIGITAL DIAGNOSTIC UNILATERAL LEFT MAMMOGRAM WITH TOMO AND CAD
6 series · 6 of 14 positions shown · non-contrast
Comparison: Previous exam(s).

CLINICAL DATA: The patient has known DCIS in the upper-outer left
breast. The patient is on tamoxifen but did not undergo lumpectomy.

EXAM:
DIGITAL DIAGNOSTIC UNILATERAL LEFT MAMMOGRAM WITH CAD AND TOMO

[L CC]
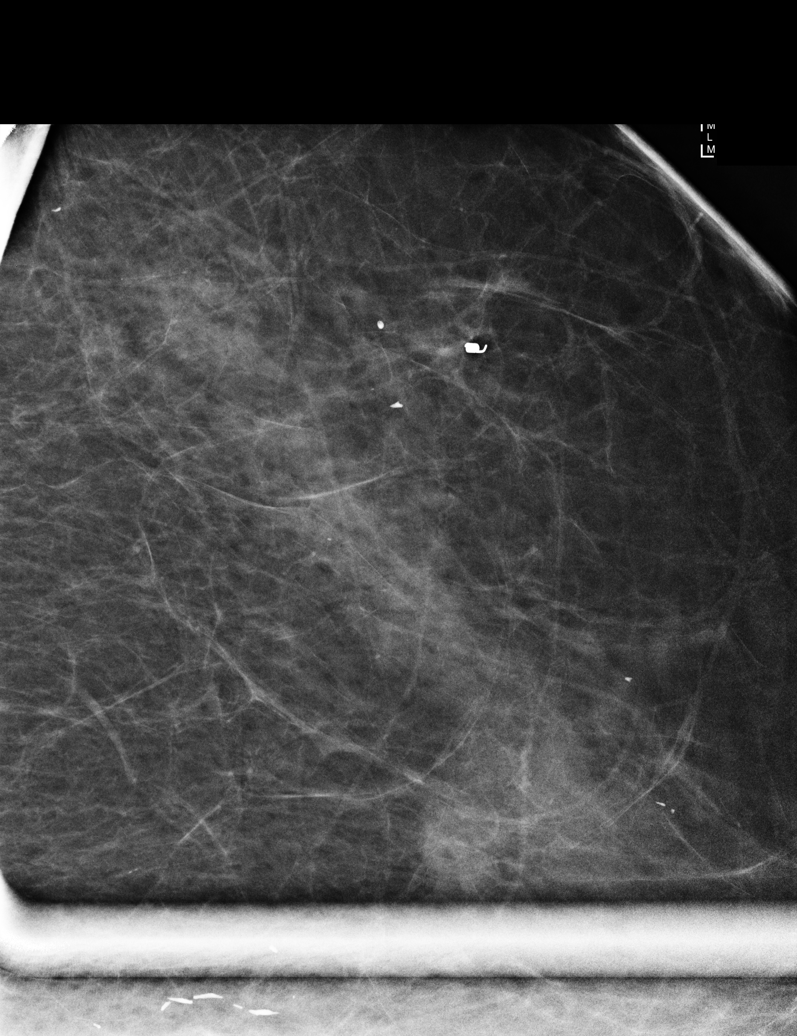

[L ML]
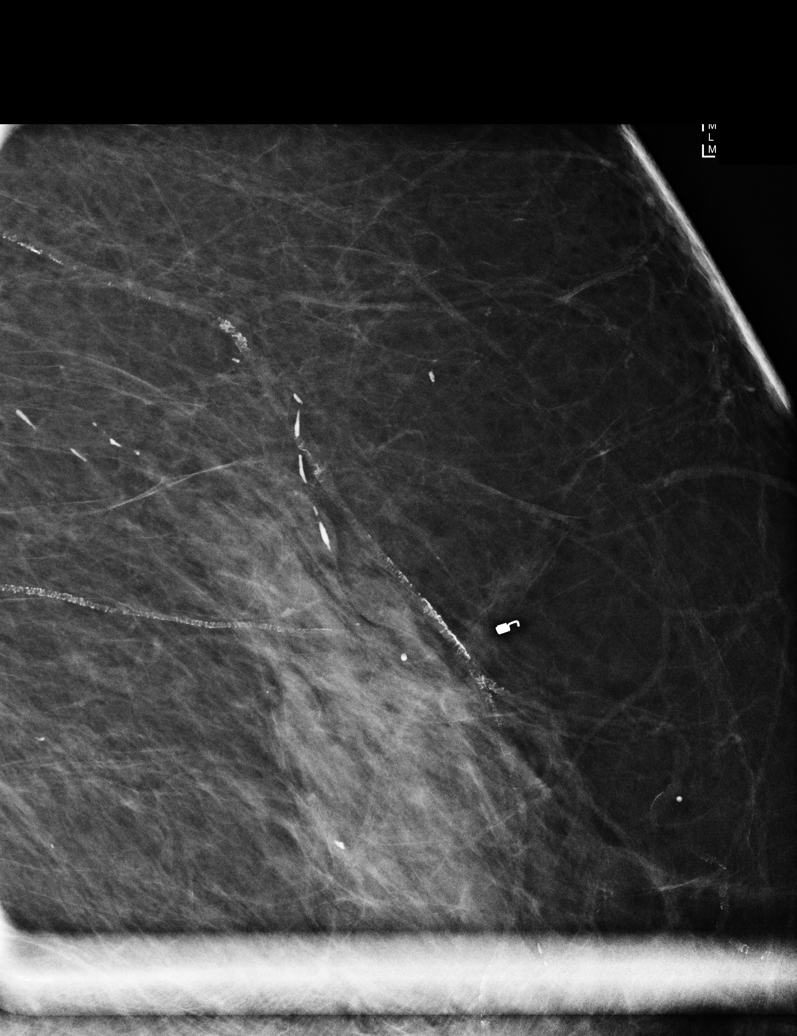

[L CC synth-2D]
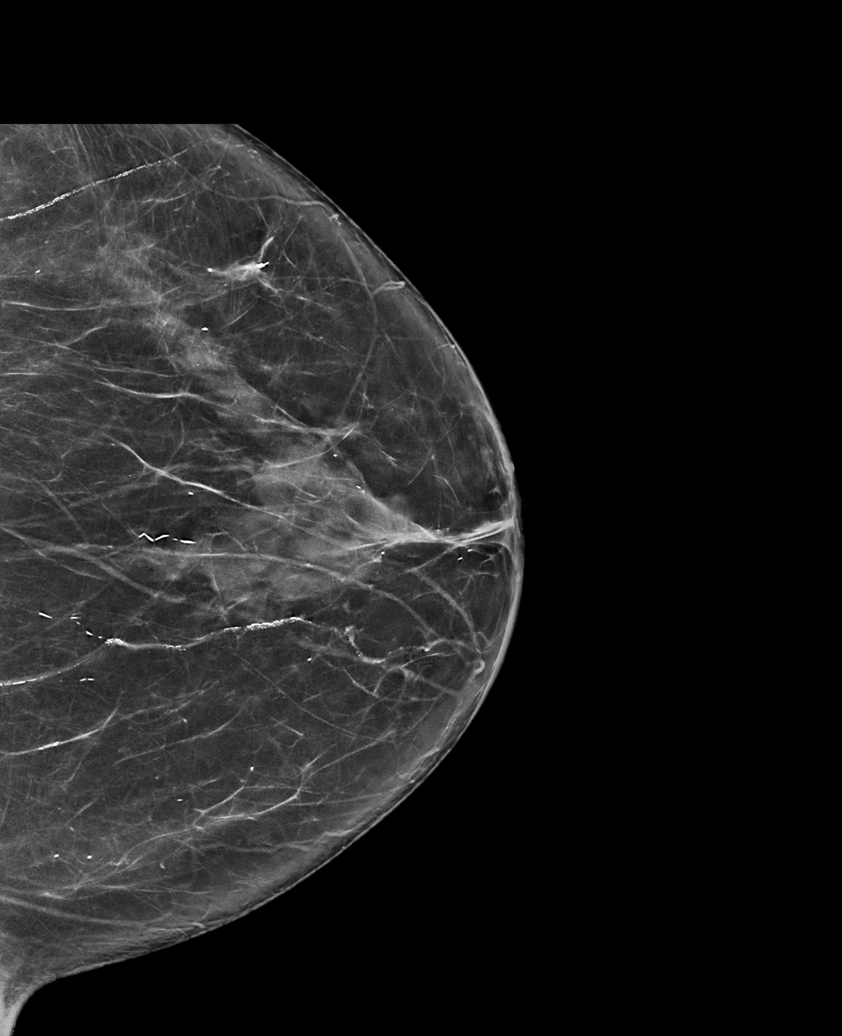

[L MLO synth-2D]
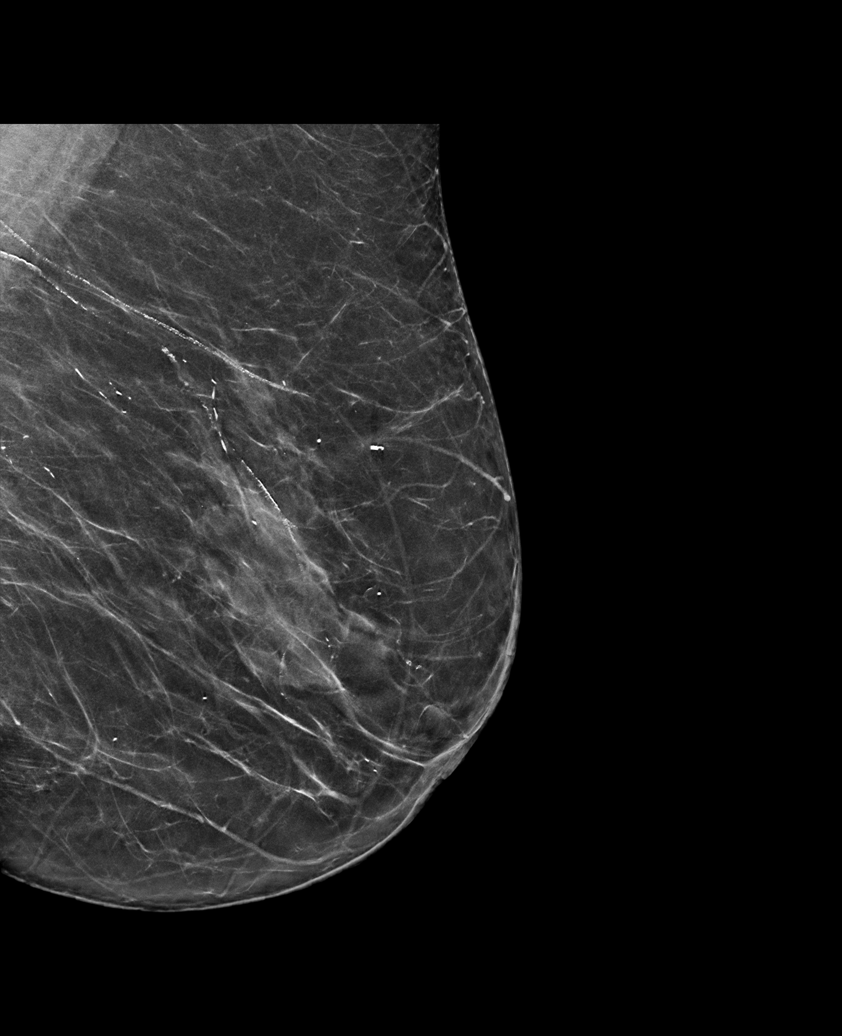

[L MLO tomo · tomo slice 37/73.0]
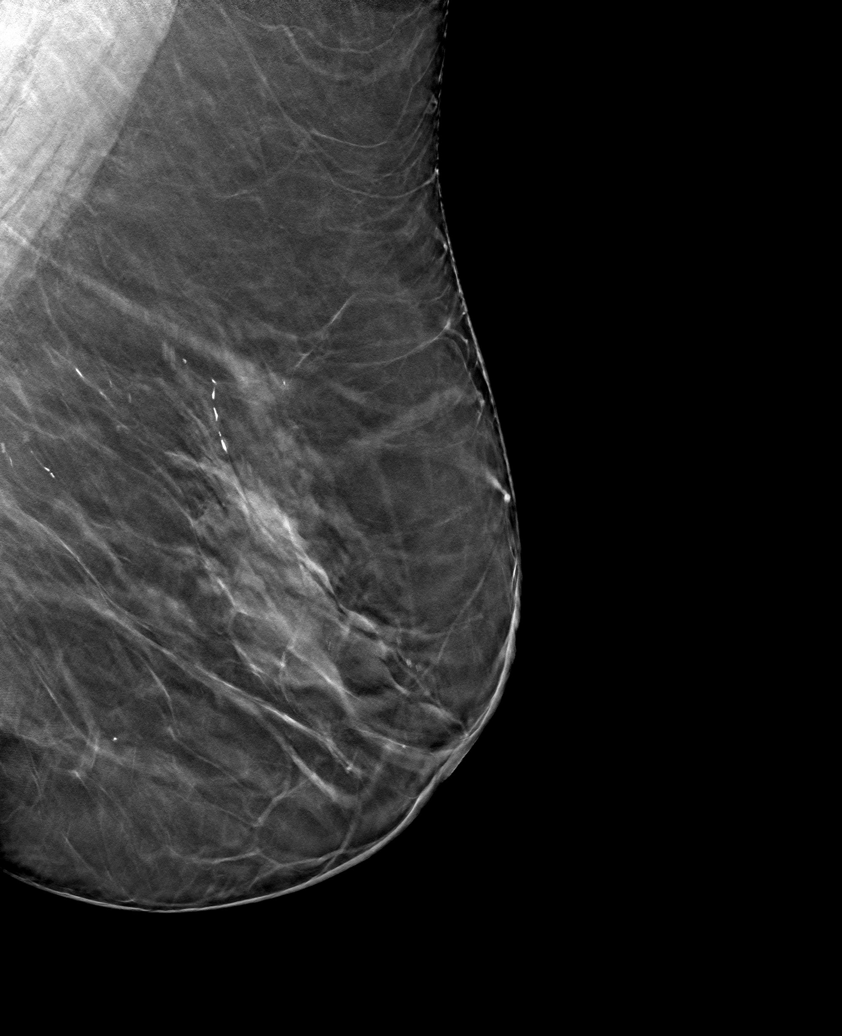

[L CC tomo · tomo slice 38/75.0]
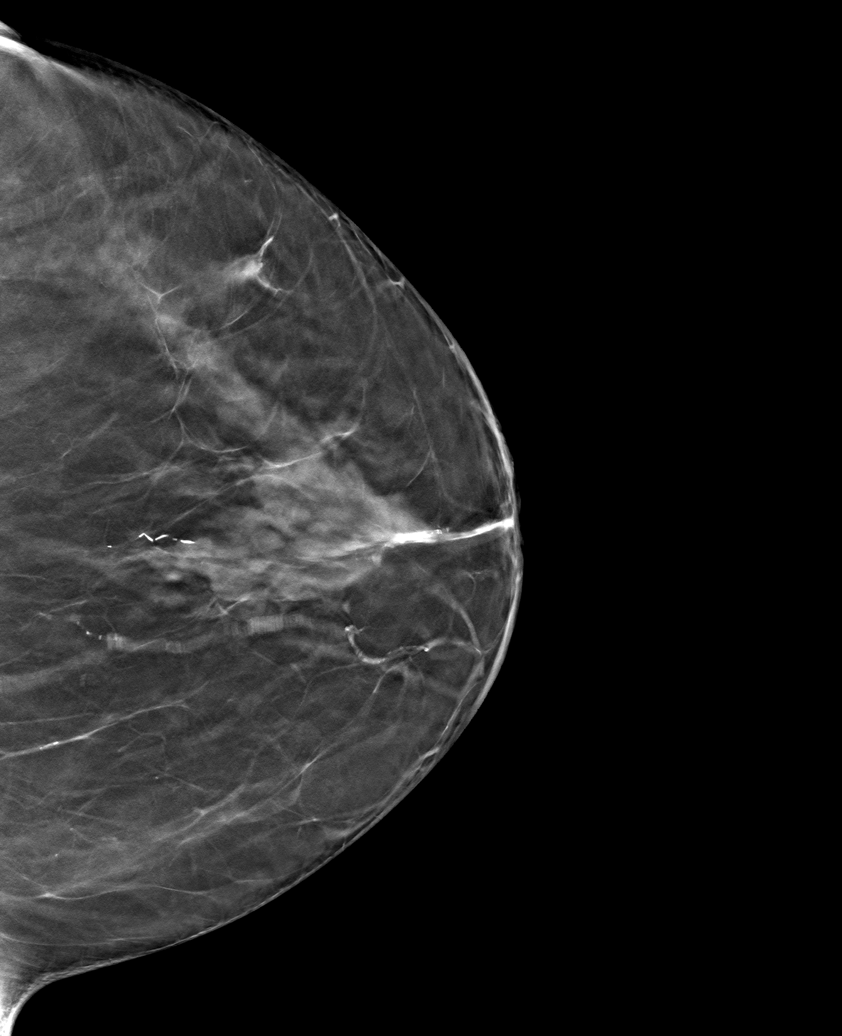

[6 of 14 positions shown; findings below may reference images not displayed]

ACR Breast Density Category b: There are scattered areas of
fibroglandular density.
FINDINGS: Post biopsy changes are seen on the CC view in the region of the
patient's known DCIS. There is been no interval growth of the
previously biopsied mass, primarily based on the MLO view. No
evidence of other malignancy in the left breast.

Mammographic images were processed with CAD.
IMPRESSION: No significant change in the region of the patient's known DCIS. No
other suspicious findings on the left.

RECOMMENDATION:
Recommend six-month follow-up diagnostic mammogram of the patient's
known DCIS in 6 months. The patient will be due for bilateral
mammography at that time.

I have discussed the findings and recommendations with the patient.
Results were also provided in writing at the conclusion of the
visit. If applicable, a reminder letter will be sent to the patient
regarding the next appointment.

BI-RADS CATEGORY  6: Known biopsy-proven malignancy.

## 2018-05-08 NOTE — Progress Notes (Signed)
Patient Care Team: Marco Collie, MD as PCP - General (Family Medicine)  DIAGNOSIS:    ICD-10-CM   1. Ductal carcinoma in situ (DCIS) of left breast D05.12     SUMMARY OF ONCOLOGIC HISTORY:   Ductal carcinoma in situ (DCIS) of left breast   10/23/2017 Initial Diagnosis    Screening detected 5 mm indeterminate mass left breast upper outer quadrant biopsy revealed intermediate grade DCIS ER 100%, PR 100%, Tis N0 stage 0    11/08/2017 -  Anti-estrogen oral therapy    Tamoxifen as part of active surveillance because the patient does not want to undergo surgery at this time.     CHIEF COMPLIANT: Follow-up on tamoxifen therapy  INTERVAL HISTORY: Elizabeth [name] is a 73 y.o. with above-mentioned history of left breast DCIS who refused to undergo surgery and is now on active surveillance with tamoxifen. Her most recent mammogram from 05/08/18 showed no change in her known DCIS and no other suspicious findings. She presents to the clinic today with her friend and is doing well. She denies hot flashes but notes muscle stiffness in the morning when she wakes up. She denies any pain or discomfort in her breast. She has had difficulty losing weight, which she attributes to Tamoxifen.  REVIEW OF SYSTEMS:   Constitutional: Denies fevers, chills or abnormal weight loss Eyes: Denies blurriness of vision Ears, nose, mouth, throat, and face: Denies mucositis or sore throat Respiratory: Denies cough, dyspnea or wheezes Cardiovascular: Denies palpitation, chest discomfort Gastrointestinal: Denies nausea, heartburn or change in bowel habits Skin: Denies abnormal skin rashes MSK: (+) muscle sitffness Lymphatics: Denies new lymphadenopathy or easy bruising Neurological: Denies numbness, tingling or new weaknesses Behavioral/Psych: Mood is stable, no new changes  Extremities: No lower extremity edema Breast: denies any pain or lumps or nodules in either breasts All other systems were reviewed with  the patient and are negative.  I have reviewed the past medical history, past surgical history, social history and family history with the patient and they are unchanged from previous note.  ALLERGIES:  is allergic to codeine; hydrocodone; lipitor [atorvastatin]; penicillins; and prednisone.  MEDICATIONS:  Current Outpatient Medications  Medication Sig Dispense Refill  . Cholecalciferol (VITAMIN D3) 2000 units TABS Take 2,000 Units by mouth daily with breakfast.    . Cyanocobalamin (VITAMIN B-12) 5000 MCG SUBL Place 5,000 mcg under the tongue daily with breakfast.    . omeprazole (PRILOSEC) 40 MG capsule Take 40 mg by mouth daily as needed (for acid reflux/indigestion.).    Marland Kitchen tamoxifen (NOLVADEX) 20 MG tablet Take 1 tablet (20 mg total) by mouth daily. 90 tablet 3   No current facility-administered medications for this visit.     PHYSICAL EXAMINATION: ECOG PERFORMANCE STATUS: 0 - Asymptomatic  Vitals:   05/09/18 0822  BP: (!) 157/94  Pulse: 92  Resp: 17  Temp: 98.8 F (37.1 C)  SpO2: 96%   Filed Weights   05/09/18 0822  Weight: 267 lb 6.4 oz (121.3 kg)    GENERAL: alert, no distress and comfortable SKIN: skin color, texture, turgor are normal, no rashes or significant lesions EYES: normal, Conjunctiva are pink and non-injected, sclera clear OROPHARYNX: no exudate, no erythema and lips, buccal mucosa, and tongue normal  NECK: supple, thyroid normal size, non-tender, without nodularity LYMPH: no palpable lymphadenopathy in the cervical, axillary or inguinal LUNGS: clear to auscultation and percussion with normal breathing effort HEART: Pansystolic murmur in the mitrol and aortic areas. Regular rate & rhythm and no  lower extremity edema ABDOMEN: abdomen soft, non-tender and normal bowel sounds MUSCULOSKELETAL: no cyanosis of digits and no clubbing  NEURO: alert & oriented x 3 with fluent speech, no focal motor/sensory deficits EXTREMITIES: No lower extremity  edema  LABORATORY DATA:  I have reviewed the data as listed CMP Latest Ref Rng & Units 06/05/2017  Glucose 65 - 99 mg/dL 81  BUN 6 - 20 mg/dL 18  Creatinine 0.44 - 1.00 mg/dL 0.80  Sodium 135 - 145 mmol/L 143  Potassium 3.5 - 5.1 mmol/L 4.1  Chloride 101 - 111 mmol/L 112(H)  CO2 22 - 32 mmol/L 23  Calcium 8.9 - 10.3 mg/dL 9.3    Lab Results  Component Value Date   WBC 7.5 06/05/2017   HGB 13.1 06/05/2017   HCT 39.7 06/05/2017   MCV 90.0 06/05/2017   PLT 180 06/05/2017    ASSESSMENT & PLAN:  Ductal carcinoma in situ (DCIS) of left breast 10/23/2017:Screening detected 5 mm indeterminate mass left breast upper outer quadrant biopsy revealed intermediate grade DCIS ER 100%, PR 100%, Tis N0 stage 0  Treatment plan: Active surveillance with tamoxifen and mammograms every 6 months.  Patient decided not to undergo surgery. Current treatment: Tamoxifen 20 mg daily started 10/31/2017  Tamoxifen toxicities: Denies any hot flashes or myalgias. Obesity: Patient blames tamoxifen for inability to lose weight  Breast cancer surveillance: 1.  Mammogram scheduled for 11/08/2018, mammogram done 05/08/2018: No significant change in the DCIS 2.  Breast exam 05/09/2018: Benign  Return to clinic in 6 months for follow-up    No orders of the defined types were placed in this encounter.  The patient has a good understanding of the overall plan. she agrees with it. she will call with any problems that may develop before the next visit here.  Nicholas Lose, MD 05/09/2018  Julious Oka Dorshimer am acting as scribe for Dr. Nicholas Lose.  I have reviewed the above documentation for accuracy and completeness, and I agree with the above.

## 2018-05-08 NOTE — Assessment & Plan Note (Signed)
10/23/2017:Screening detected 5 mm indeterminate mass left breast upper outer quadrant biopsy revealed intermediate grade DCIS ER 100%, PR 100%, Tis N0 stage 0  Treatment plan: Active surveillance with tamoxifen and mammograms every 6 months.  Patient decided not to undergo surgery. Current treatment: Tamoxifen 20 mg daily started 10/31/2017  Tamoxifen toxicities: Denies any hot flashes or myalgias. Breast cancer surveillance: 1.  Mammogram scheduled for 11/08/2018 2.  Breast exam 05/09/2018: Benign  Return to clinic in 3 months after mammograms and follow-up

## 2018-05-09 ENCOUNTER — Inpatient Hospital Stay: Payer: Medicare Other | Attending: Hematology and Oncology | Admitting: Hematology and Oncology

## 2018-05-09 ENCOUNTER — Telehealth: Payer: Self-pay | Admitting: Hematology and Oncology

## 2018-05-09 DIAGNOSIS — D0512 Intraductal carcinoma in situ of left breast: Secondary | ICD-10-CM | POA: Insufficient documentation

## 2018-05-09 DIAGNOSIS — Z79810 Long term (current) use of selective estrogen receptor modulators (SERMs): Secondary | ICD-10-CM | POA: Insufficient documentation

## 2018-05-09 DIAGNOSIS — Z170 Estrogen receptor positive status [ER+]: Secondary | ICD-10-CM

## 2018-05-09 NOTE — Telephone Encounter (Signed)
Gave avs and calendar ° °

## 2018-05-21 DIAGNOSIS — R030 Elevated blood-pressure reading, without diagnosis of hypertension: Secondary | ICD-10-CM | POA: Diagnosis not present

## 2018-05-21 DIAGNOSIS — J302 Other seasonal allergic rhinitis: Secondary | ICD-10-CM | POA: Diagnosis not present

## 2018-06-06 DIAGNOSIS — M47816 Spondylosis without myelopathy or radiculopathy, lumbar region: Secondary | ICD-10-CM | POA: Diagnosis not present

## 2018-06-06 DIAGNOSIS — M47818 Spondylosis without myelopathy or radiculopathy, sacral and sacrococcygeal region: Secondary | ICD-10-CM | POA: Diagnosis not present

## 2018-06-06 DIAGNOSIS — M5136 Other intervertebral disc degeneration, lumbar region: Secondary | ICD-10-CM | POA: Diagnosis not present

## 2018-06-06 DIAGNOSIS — G894 Chronic pain syndrome: Secondary | ICD-10-CM | POA: Diagnosis not present

## 2018-06-06 DIAGNOSIS — M25551 Pain in right hip: Secondary | ICD-10-CM | POA: Diagnosis not present

## 2018-06-19 DIAGNOSIS — Z139 Encounter for screening, unspecified: Secondary | ICD-10-CM | POA: Diagnosis not present

## 2018-06-19 DIAGNOSIS — Z0000 Encounter for general adult medical examination without abnormal findings: Secondary | ICD-10-CM | POA: Diagnosis not present

## 2018-06-27 DIAGNOSIS — M47818 Spondylosis without myelopathy or radiculopathy, sacral and sacrococcygeal region: Secondary | ICD-10-CM | POA: Diagnosis not present

## 2018-06-27 DIAGNOSIS — M5136 Other intervertebral disc degeneration, lumbar region: Secondary | ICD-10-CM | POA: Diagnosis not present

## 2018-07-05 DIAGNOSIS — E782 Mixed hyperlipidemia: Secondary | ICD-10-CM | POA: Diagnosis not present

## 2018-07-05 DIAGNOSIS — I129 Hypertensive chronic kidney disease with stage 1 through stage 4 chronic kidney disease, or unspecified chronic kidney disease: Secondary | ICD-10-CM | POA: Diagnosis not present

## 2018-07-12 DIAGNOSIS — G4700 Insomnia, unspecified: Secondary | ICD-10-CM | POA: Diagnosis not present

## 2018-07-12 DIAGNOSIS — N182 Chronic kidney disease, stage 2 (mild): Secondary | ICD-10-CM | POA: Diagnosis not present

## 2018-07-12 DIAGNOSIS — I129 Hypertensive chronic kidney disease with stage 1 through stage 4 chronic kidney disease, or unspecified chronic kidney disease: Secondary | ICD-10-CM | POA: Diagnosis not present

## 2018-07-12 DIAGNOSIS — E782 Mixed hyperlipidemia: Secondary | ICD-10-CM | POA: Diagnosis not present

## 2018-08-14 DIAGNOSIS — M1712 Unilateral primary osteoarthritis, left knee: Secondary | ICD-10-CM | POA: Diagnosis not present

## 2018-08-16 DIAGNOSIS — Z7189 Other specified counseling: Secondary | ICD-10-CM | POA: Diagnosis not present

## 2018-08-16 DIAGNOSIS — G4700 Insomnia, unspecified: Secondary | ICD-10-CM | POA: Diagnosis not present

## 2018-08-17 DIAGNOSIS — M1712 Unilateral primary osteoarthritis, left knee: Secondary | ICD-10-CM | POA: Diagnosis not present

## 2018-09-04 ENCOUNTER — Ambulatory Visit (INDEPENDENT_AMBULATORY_CARE_PROVIDER_SITE_OTHER): Payer: Medicare Other

## 2018-09-04 ENCOUNTER — Ambulatory Visit: Payer: Medicare Other | Admitting: Podiatry

## 2018-09-04 ENCOUNTER — Other Ambulatory Visit: Payer: Self-pay | Admitting: Podiatry

## 2018-09-04 ENCOUNTER — Encounter: Payer: Self-pay | Admitting: Podiatry

## 2018-09-04 ENCOUNTER — Other Ambulatory Visit: Payer: Self-pay

## 2018-09-04 VITALS — Temp 97.4°F | Resp 16

## 2018-09-04 DIAGNOSIS — G5761 Lesion of plantar nerve, right lower limb: Secondary | ICD-10-CM | POA: Diagnosis not present

## 2018-09-04 DIAGNOSIS — G5763 Lesion of plantar nerve, bilateral lower limbs: Secondary | ICD-10-CM | POA: Diagnosis not present

## 2018-09-04 DIAGNOSIS — G5762 Lesion of plantar nerve, left lower limb: Secondary | ICD-10-CM

## 2018-09-04 DIAGNOSIS — M7741 Metatarsalgia, right foot: Secondary | ICD-10-CM | POA: Diagnosis not present

## 2018-09-04 DIAGNOSIS — M7742 Metatarsalgia, left foot: Secondary | ICD-10-CM

## 2018-09-04 DIAGNOSIS — M79671 Pain in right foot: Secondary | ICD-10-CM

## 2018-09-04 NOTE — Progress Notes (Signed)
Subjective:  Patient ID: Elizabeth [name], female    DOB: 10/11/1945,  MRN: [id number]  Chief Complaint  Patient presents with  . Foot Pain    BL foot pain ("all over my feet") x 1 yr; 9/10 sharp constant pains -pt denies injury -R>L -pt states," on the Rt foot it feels like walking on cottom ball." Tx: inserts, good supp shoes, epsom salts soaking -w/ swelling    73 y.o. female presents with the above complaint. Hx as above.  Review of Systems: Negative except as noted in the HPI. Denies N/V/F/Ch.  Past Medical History:  Diagnosis Date  . Anxiety    extreme  . Aortic valve disorder   . Benign hypertension with CKD (chronic kidney disease), stage II   . Claustrophobia   . Edema   . GERD (gastroesophageal reflux disease)   . Heart murmur   . Hyperuricemia   . Lumbar stenosis   . Osteoarthrosis   . Vitamin D deficiency     Current Outpatient Medications:  .  Cholecalciferol (VITAMIN D3) 2000 units TABS, Take 2,000 Units by mouth daily with breakfast., Disp: , Rfl:  .  Cyanocobalamin (VITAMIN B-12) 5000 MCG SUBL, Place 5,000 mcg under the tongue daily with breakfast., Disp: , Rfl:  .  omeprazole (PRILOSEC) 40 MG capsule, Take 40 mg by mouth daily as needed (for acid reflux/indigestion.)., Disp: , Rfl:  .  tamoxifen (NOLVADEX) 20 MG tablet, Take 1 tablet (20 mg total) by mouth daily., Disp: 90 tablet, Rfl: 3  Social History   Tobacco Use  Smoking Status Never Smoker  Smokeless Tobacco Never Used    Allergies  Allergen Reactions  . Codeine Nausea And Vomiting  . Hydrocodone Nausea And Vomiting  . Lipitor [Atorvastatin] Itching and Other (See Comments)    Redness and itching all over body  . Penicillins Hives    Has patient had a PCN reaction causing immediate rash, facial/tongue/throat swelling, SOB or lightheadedness with hypotension: Yes Has patient had a PCN reaction causing severe rash involving mucus membranes or skin necrosis: Yes Has patient had a PCN  reaction that required hospitalization: No Has patient had a PCN reaction occurring within the last 10 years: No If all of the above answers are "NO", then may proceed with Cephalosporin use.   . Prednisone Other (See Comments)    Red and hot all over    Objective:   Vitals:   09/04/18 0946  Resp: 16  Temp: (!) 97.4 F (36.3 C)   There is no height or weight on file to calculate BMI. Constitutional Well developed. Well nourished.  Vascular Dorsalis pedis pulses palpable bilaterally. Posterior tibial pulses palpable bilaterally. Capillary refill normal to all digits.  No cyanosis or clubbing noted. Pedal hair growth normal.  Neurologic Normal speech. Oriented to person, place, and time. Epicritic sensation to light touch grossly present bilaterally.  Dermatologic Nails well groomed and normal in appearance. No open wounds. No skin lesions.  Orthopedic: POP 3rd interspace bilat with Mulder's click. Edema pitting bilat   Radiographs: No acute fractures or dislocations slight sullivan sign right Assessment:   1. Morton's neuroma of both feet   2. Metatarsalgia of both feet    Plan:  Patient was evaluated and treated and all questions answered.  Interdigital Neuroma, bilaterally -Educated on etiology -Interspace injection delivered as below. -Educated on padding and proper shoegear  Procedure: Neuroma Injection Location: Bilateral 3rd interspace Skin Prep: Alcohol. Injectate: 0.5 cc 0.5% marcaine plain, 0.5 cc celestone. Disposition:  Patient tolerated procedure well. Injection site dressed with a band-aid.  Return in about 3 weeks (around 09/25/2018) for Neuroma, Bilateral.

## 2018-09-04 NOTE — Patient Instructions (Signed)

## 2018-09-06 ENCOUNTER — Other Ambulatory Visit: Payer: Self-pay | Admitting: Podiatry

## 2018-09-06 DIAGNOSIS — M79671 Pain in right foot: Secondary | ICD-10-CM

## 2018-09-06 DIAGNOSIS — G5763 Lesion of plantar nerve, bilateral lower limbs: Secondary | ICD-10-CM

## 2018-09-08 DIAGNOSIS — R0602 Shortness of breath: Secondary | ICD-10-CM | POA: Diagnosis not present

## 2018-09-08 DIAGNOSIS — I517 Cardiomegaly: Secondary | ICD-10-CM | POA: Diagnosis not present

## 2018-09-08 DIAGNOSIS — I509 Heart failure, unspecified: Secondary | ICD-10-CM | POA: Diagnosis not present

## 2018-09-08 DIAGNOSIS — I110 Hypertensive heart disease with heart failure: Secondary | ICD-10-CM | POA: Diagnosis not present

## 2018-09-08 DIAGNOSIS — J90 Pleural effusion, not elsewhere classified: Secondary | ICD-10-CM | POA: Diagnosis not present

## 2018-09-09 DIAGNOSIS — I10 Essential (primary) hypertension: Secondary | ICD-10-CM | POA: Diagnosis not present

## 2018-09-09 DIAGNOSIS — I110 Hypertensive heart disease with heart failure: Secondary | ICD-10-CM | POA: Diagnosis not present

## 2018-09-09 DIAGNOSIS — I517 Cardiomegaly: Secondary | ICD-10-CM | POA: Diagnosis not present

## 2018-09-09 DIAGNOSIS — I5033 Acute on chronic diastolic (congestive) heart failure: Secondary | ICD-10-CM | POA: Diagnosis not present

## 2018-09-09 DIAGNOSIS — K219 Gastro-esophageal reflux disease without esophagitis: Secondary | ICD-10-CM | POA: Diagnosis not present

## 2018-09-09 DIAGNOSIS — Z79899 Other long term (current) drug therapy: Secondary | ICD-10-CM | POA: Diagnosis not present

## 2018-09-09 DIAGNOSIS — Z79891 Long term (current) use of opiate analgesic: Secondary | ICD-10-CM | POA: Diagnosis not present

## 2018-09-09 DIAGNOSIS — G4700 Insomnia, unspecified: Secondary | ICD-10-CM | POA: Diagnosis not present

## 2018-09-09 DIAGNOSIS — I352 Nonrheumatic aortic (valve) stenosis with insufficiency: Secondary | ICD-10-CM | POA: Diagnosis not present

## 2018-09-09 DIAGNOSIS — J90 Pleural effusion, not elsewhere classified: Secondary | ICD-10-CM | POA: Diagnosis not present

## 2018-09-09 DIAGNOSIS — R0601 Orthopnea: Secondary | ICD-10-CM | POA: Diagnosis not present

## 2018-09-09 DIAGNOSIS — R000 Tachycardia, unspecified: Secondary | ICD-10-CM | POA: Diagnosis not present

## 2018-09-09 DIAGNOSIS — E785 Hyperlipidemia, unspecified: Secondary | ICD-10-CM | POA: Diagnosis not present

## 2018-09-09 DIAGNOSIS — I509 Heart failure, unspecified: Secondary | ICD-10-CM | POA: Diagnosis not present

## 2018-09-09 DIAGNOSIS — R0602 Shortness of breath: Secondary | ICD-10-CM | POA: Diagnosis not present

## 2018-09-09 DIAGNOSIS — I340 Nonrheumatic mitral (valve) insufficiency: Secondary | ICD-10-CM | POA: Diagnosis not present

## 2018-09-18 DIAGNOSIS — E782 Mixed hyperlipidemia: Secondary | ICD-10-CM | POA: Diagnosis not present

## 2018-09-18 DIAGNOSIS — G4700 Insomnia, unspecified: Secondary | ICD-10-CM | POA: Diagnosis not present

## 2018-09-18 DIAGNOSIS — Z7689 Persons encountering health services in other specified circumstances: Secondary | ICD-10-CM | POA: Diagnosis not present

## 2018-09-18 DIAGNOSIS — I5030 Unspecified diastolic (congestive) heart failure: Secondary | ICD-10-CM | POA: Diagnosis not present

## 2018-09-25 ENCOUNTER — Ambulatory Visit (INDEPENDENT_AMBULATORY_CARE_PROVIDER_SITE_OTHER): Payer: Medicare Other | Admitting: Podiatry

## 2018-09-25 ENCOUNTER — Other Ambulatory Visit: Payer: Self-pay

## 2018-09-25 ENCOUNTER — Encounter: Payer: Self-pay | Admitting: Podiatry

## 2018-09-25 VITALS — Temp 97.6°F | Resp 16

## 2018-09-25 DIAGNOSIS — M7751 Other enthesopathy of right foot: Secondary | ICD-10-CM | POA: Diagnosis not present

## 2018-09-25 DIAGNOSIS — G4700 Insomnia, unspecified: Secondary | ICD-10-CM

## 2018-09-25 DIAGNOSIS — I509 Heart failure, unspecified: Secondary | ICD-10-CM | POA: Insufficient documentation

## 2018-09-25 DIAGNOSIS — Q828 Other specified congenital malformations of skin: Secondary | ICD-10-CM

## 2018-09-25 DIAGNOSIS — G5763 Lesion of plantar nerve, bilateral lower limbs: Secondary | ICD-10-CM | POA: Diagnosis not present

## 2018-09-25 DIAGNOSIS — R0601 Orthopnea: Secondary | ICD-10-CM | POA: Insufficient documentation

## 2018-09-25 DIAGNOSIS — R000 Tachycardia, unspecified: Secondary | ICD-10-CM | POA: Insufficient documentation

## 2018-09-25 DIAGNOSIS — I10 Essential (primary) hypertension: Secondary | ICD-10-CM | POA: Insufficient documentation

## 2018-09-25 HISTORY — DX: Insomnia, unspecified: G47.00

## 2018-09-25 HISTORY — DX: Essential (primary) hypertension: I10

## 2018-09-25 HISTORY — DX: Tachycardia, unspecified: R00.0

## 2018-09-25 NOTE — Progress Notes (Signed)
Subjective:  Patient ID: Elizabeth [name], female    DOB: 06/21/1945,  MRN: [id number]  Chief Complaint  Patient presents with  . Neuroma    F/U BL neuroma Pt. states," not any worse, but not no better; 6/10 pain." tx: frozen bottle, good supp shoes and inserts -pt also c/o Rt sub met 5 lesion that needs trimming -pt denies N/v/F/Ch     73 y.o. female presents with the above complaint. Hx as above.  Review of Systems: Negative except as noted in the HPI. Denies N/V/F/Ch.  Past Medical History:  Diagnosis Date  . Anxiety    extreme  . Aortic valve disorder   . Benign hypertension with CKD (chronic kidney disease), stage II   . Claustrophobia   . Edema   . GERD (gastroesophageal reflux disease)   . Heart murmur   . Hyperuricemia   . Lumbar stenosis   . Osteoarthrosis   . Vitamin D deficiency     Current Outpatient Medications:  .  amLODipine (NORVASC) 5 MG tablet, Take 5 mg by mouth., Disp: , Rfl:  .  carvedilol (COREG) 6.25 MG tablet, TK 1 T PO  BID, Disp: , Rfl:  .  Cholecalciferol (VITAMIN D3) 2000 units TABS, Take 2,000 Units by mouth daily with breakfast., Disp: , Rfl:  .  Cyanocobalamin (VITAMIN B-12) 5000 MCG SUBL, Place 5,000 mcg under the tongue daily with breakfast., Disp: , Rfl:  .  fexofenadine (ALLEGRA) 180 MG tablet, Take 180 mg by mouth., Disp: , Rfl:  .  fluticasone (FLONASE) 50 MCG/ACT nasal spray, Place 2 sprays into both nostrils daily., Disp: , Rfl:  .  furosemide (LASIX) 20 MG tablet, Take 20 mg by mouth., Disp: , Rfl:  .  lisinopril (ZESTRIL) 10 MG tablet, TK 1 T PO  QD, Disp: , Rfl:  .  omeprazole (PRILOSEC) 40 MG capsule, Take 40 mg by mouth daily as needed (for acid reflux/indigestion.)., Disp: , Rfl:  .  tamoxifen (NOLVADEX) 20 MG tablet, Take 1 tablet (20 mg total) by mouth daily., Disp: 90 tablet, Rfl: 3  Social History   Tobacco Use  Smoking Status Never Smoker  Smokeless Tobacco Never Used    Allergies  Allergen Reactions  . Aspirin  Itching  . Codeine Nausea And Vomiting  . Hydrocodone Nausea And Vomiting  . Lipitor [Atorvastatin] Itching and Other (See Comments)    Redness and itching all over body  . Penicillins Hives    Has patient had a PCN reaction causing immediate rash, facial/tongue/throat swelling, SOB or lightheadedness with hypotension: Yes Has patient had a PCN reaction causing severe rash involving mucus membranes or skin necrosis: Yes Has patient had a PCN reaction that required hospitalization: No Has patient had a PCN reaction occurring within the last 10 years: No If all of the above answers are "NO", then may proceed with Cephalosporin use.   . Prednisone Other (See Comments)    Red and hot all over    Objective:   Vitals:   09/25/18 0914  Resp: 16  Temp: 97.6 F (36.4 C)   There is no height or weight on file to calculate BMI. Constitutional Well developed. Well nourished.  Vascular Dorsalis pedis pulses palpable bilaterally. Posterior tibial pulses palpable bilaterally. Capillary refill normal to all digits.  No cyanosis or clubbing noted. Pedal hair growth normal.  Neurologic Normal speech. Oriented to person, place, and time. Epicritic sensation to light touch grossly present bilaterally.  Dermatologic Nails well groomed and normal in appearance.  No open wounds. Punctate keratosis right 5th MPJ plantar to the metatarsal head.  Orthopedic: POP 3rd interspace bilat with Mulder's click. Edema pitting bilat POP R 5th MPJ    Radiographs: None today Assessment:   1. Morton's neuroma of both feet   2. Capsulitis of metatarsophalangeal (MTP) joint of right foot   3. Porokeratosis    Plan:  Patient was evaluated and treated and all questions answered.  Interdigital Neuroma, bilaterally -Injection #2 as below.  Procedure: Neuroma Injection Location: Bilateral 3rd interspace Skin Prep: Alcohol. Injectate: 0.5 cc 0.5% marcaine plain, 0.5 cc dexamethasone phosphate. Disposition:  Patient tolerated procedure well. Injection site dressed with a band-aid.  Capsulitis R 5th MPJ with Porokeratosis -Discussed padding, self care of callus possible bone excision -Patient declines paring of callus. -Offloaded with padding -No plans for surgery at present  No follow-ups on file.

## 2018-10-04 DIAGNOSIS — R55 Syncope and collapse: Secondary | ICD-10-CM | POA: Diagnosis not present

## 2018-10-04 DIAGNOSIS — I5030 Unspecified diastolic (congestive) heart failure: Secondary | ICD-10-CM | POA: Diagnosis not present

## 2018-10-11 DIAGNOSIS — I509 Heart failure, unspecified: Secondary | ICD-10-CM | POA: Diagnosis not present

## 2018-10-16 ENCOUNTER — Ambulatory Visit: Payer: Medicare Other | Admitting: Podiatry

## 2018-10-16 ENCOUNTER — Other Ambulatory Visit: Payer: Self-pay

## 2018-10-16 ENCOUNTER — Encounter: Payer: Self-pay | Admitting: Podiatry

## 2018-10-16 VITALS — Temp 97.7°F | Resp 16

## 2018-10-16 DIAGNOSIS — G5763 Lesion of plantar nerve, bilateral lower limbs: Secondary | ICD-10-CM | POA: Diagnosis not present

## 2018-10-16 DIAGNOSIS — M7751 Other enthesopathy of right foot: Secondary | ICD-10-CM

## 2018-10-16 NOTE — Progress Notes (Signed)
Subjective:  Patient ID: Elizabeth [name], female    DOB: 11-21-1945,  MRN: [id number]  Chief Complaint  Patient presents with  . Neuroma    F/U BL neuroma Pt.s tates," Lt is fine, but still w/numbness and tingling on the Rt."   . capsulitis    F/U Rt 5th capsulitis Pt. states," it's okay, I think it's better. I've been using a tx for corn and it feels better."     73 y.o. female presents with the above complaint. Hx as above. Left is doing much better, right having tingling still.  No pain at the 5th MPJ area of the right foot. Using corn remover and padding and states it is better.  Review of Systems: Negative except as noted in the HPI. Denies N/V/F/Ch.  Past Medical History:  Diagnosis Date  . Anxiety    extreme  . Aortic valve disorder   . Benign hypertension with CKD (chronic kidney disease), stage II   . Claustrophobia   . Edema   . GERD (gastroesophageal reflux disease)   . Heart murmur   . Hyperuricemia   . Lumbar stenosis   . Osteoarthrosis   . Vitamin D deficiency     Current Outpatient Medications:  .  amLODipine (NORVASC) 5 MG tablet, Take 5 mg by mouth., Disp: , Rfl:  .  carvedilol (COREG) 6.25 MG tablet, TK 1 T PO  BID, Disp: , Rfl:  .  furosemide (LASIX) 20 MG tablet, Take 20 mg by mouth., Disp: , Rfl:  .  lisinopril (ZESTRIL) 10 MG tablet, TK 1 T PO  QD, Disp: , Rfl:   Social History   Tobacco Use  Smoking Status Never Smoker  Smokeless Tobacco Never Used    Allergies  Allergen Reactions  . Aspirin Itching  . Codeine Nausea And Vomiting  . Hydrocodone Nausea And Vomiting  . Lipitor [Atorvastatin] Itching and Other (See Comments)    Redness and itching all over body  . Penicillins Hives    Has patient had a PCN reaction causing immediate rash, facial/tongue/throat swelling, SOB or lightheadedness with hypotension: Yes Has patient had a PCN reaction causing severe rash involving mucus membranes or skin necrosis: Yes Has patient had a PCN  reaction that required hospitalization: No Has patient had a PCN reaction occurring within the last 10 years: No If all of the above answers are "NO", then may proceed with Cephalosporin use.   . Prednisone Other (See Comments)    Red and hot all over    Objective:   Vitals:   10/16/18 0851  Resp: 16  Temp: 97.7 F (36.5 C)   There is no height or weight on file to calculate BMI. Constitutional Well developed. Well nourished.  Vascular Dorsalis pedis pulses palpable bilaterally. Posterior tibial pulses palpable bilaterally. Capillary refill normal to all digits.  No cyanosis or clubbing noted. Pedal hair growth normal.  Neurologic Normal speech. Oriented to person, place, and time. Epicritic sensation to light touch grossly present bilaterally.  Dermatologic Nails well groomed and normal in appearance. No open wounds. Punctate keratosis right 5th MPJ plantar to the metatarsal head.  Orthopedic: POP 3rd interspace bilat with Mulder's click. No pain to palpation 5th MPJ right Edema pitting bilat   Radiographs: None today Assessment:   1. Morton's neuroma of both feet   2. Capsulitis of metatarsophalangeal (MTP) joint of right foot    Plan:  Patient was evaluated and treated and all questions answered.  Interdigital Neuroma, bilaterally -Improving bilat, left  still with pain to palpation but not nerve symptoms. -Injection #3 as below. -Will switch to sclerosing alcohol injections at next visit. Plan for weekly/biweekly injections.  Procedure: Neuroma Injection Location: Bilateral 3rd interspace Skin Prep: Alcohol. Injectate: 0.5 cc 0.5% marcaine plain, 0.5 cc dexamethasone phosphate. Disposition: Patient tolerated procedure well. Injection site dressed with a band-aid.   Capsulitis R 5th MPJ with Porokeratosis -Improved, no pain today. -No injection today.  Return in about 2 weeks (around 10/30/2018) for Neuroma, Bilateral, possible sclerosing injection therapy.

## 2018-10-18 DIAGNOSIS — M25562 Pain in left knee: Secondary | ICD-10-CM | POA: Diagnosis not present

## 2018-10-30 ENCOUNTER — Telehealth: Payer: Self-pay

## 2018-10-30 ENCOUNTER — Encounter: Payer: Self-pay | Admitting: Podiatry

## 2018-10-30 ENCOUNTER — Other Ambulatory Visit: Payer: Self-pay

## 2018-10-30 ENCOUNTER — Ambulatory Visit (INDEPENDENT_AMBULATORY_CARE_PROVIDER_SITE_OTHER): Payer: Medicare Other | Admitting: Podiatry

## 2018-10-30 DIAGNOSIS — G5763 Lesion of plantar nerve, bilateral lower limbs: Secondary | ICD-10-CM | POA: Diagnosis not present

## 2018-10-30 MED ORDER — TAMOXIFEN CITRATE 20 MG PO TABS: 20.0000 mg | ORAL_TABLET | Freq: Every day | ORAL | 0 refills | Status: DC

## 2018-10-30 NOTE — Telephone Encounter (Signed)
Pt called needing refills on medication, Tamoxifen.  RN refilled medication, pt aware.  No further needs.

## 2018-10-30 NOTE — Progress Notes (Signed)
  Subjective:  Patient ID: Elizabeth [name], female    DOB: 06/13/1945,  MRN: [id number]  No chief complaint on file.   73 y.o. female presents with the above complaint. Hx as above.   Review of Systems: Negative except as noted in the HPI. Denies N/V/F/Ch.  Past Medical History:  Diagnosis Date  . Anxiety    extreme  . Aortic valve disorder   . Benign hypertension with CKD (chronic kidney disease), stage II   . Claustrophobia   . Edema   . GERD (gastroesophageal reflux disease)   . Heart murmur   . Hyperuricemia   . Lumbar stenosis   . Osteoarthrosis   . Vitamin D deficiency     Current Outpatient Medications:  .  amLODipine (NORVASC) 5 MG tablet, Take 5 mg by mouth., Disp: , Rfl:  .  carvedilol (COREG) 6.25 MG tablet, TK 1 T PO  BID, Disp: , Rfl:  .  furosemide (LASIX) 20 MG tablet, Take 20 mg by mouth., Disp: , Rfl:  .  lisinopril (ZESTRIL) 10 MG tablet, TK 1 T PO  QD, Disp: , Rfl:   Social History   Tobacco Use  Smoking Status Never Smoker  Smokeless Tobacco Never Used    Allergies  Allergen Reactions  . Aspirin Itching  . Codeine Nausea And Vomiting  . Hydrocodone Nausea And Vomiting  . Lipitor [Atorvastatin] Itching and Other (See Comments)    Redness and itching all over body  . Penicillins Hives    Has patient had a PCN reaction causing immediate rash, facial/tongue/throat swelling, SOB or lightheadedness with hypotension: Yes Has patient had a PCN reaction causing severe rash involving mucus membranes or skin necrosis: Yes Has patient had a PCN reaction that required hospitalization: No Has patient had a PCN reaction occurring within the last 10 years: No If all of the above answers are "NO", then may proceed with Cephalosporin use.   . Prednisone Other (See Comments)    Red and hot all over    Objective:   There were no vitals filed for this visit. There is no height or weight on file to calculate BMI. Constitutional Well developed. Well  nourished.  Vascular Dorsalis pedis pulses palpable bilaterally. Posterior tibial pulses palpable bilaterally. Capillary refill normal to all digits.  No cyanosis or clubbing noted. Pedal hair growth normal.  Neurologic Normal speech. Oriented to person, place, and time. Epicritic sensation to light touch grossly present bilaterally.  Dermatologic Nails well groomed and normal in appearance. No open wounds. Punctate keratosis right 5th MPJ plantar to the metatarsal head.  Orthopedic: POP 3rd interspace bilat with Mulder's click. No pain to palpation 5th MPJ right   Radiographs: None today Assessment:   1. Morton's neuroma of both feet    Plan:  Patient was evaluated and treated and all questions answered.  Interdigital Neuroma, bilaterally -Improving bilat, left still with pain to palpation but not nerve symptoms. -Injection #4 as below. Final injection -Will switch to sclerosing alcohol injections at next visit. Plan for weekly/biweekly injections.  Procedure: Neuroma Injection Location: Bilateral 3rd interspace Skin Prep: Alcohol. Injectate: 0.5 cc 0.5% marcaine plain, 0.5 cc dexamethasone phosphate. Disposition: Patient tolerated procedure well. Injection site dressed with a band-aid.  Return in about 2 weeks (around 11/13/2018) for Neuroma sclerosing injection.

## 2018-10-31 NOTE — Assessment & Plan Note (Signed)
10/23/2017:Screening detected 5 mm indeterminate mass left breast upper outer quadrant biopsy revealed intermediate grade DCIS ER 100%, PR 100%, Tis N0 stage 0  Treatment plan: Active surveillance with tamoxifen and mammograms every 6 months. Patient decided not to undergo surgery. Current treatment: Tamoxifen 20 mg daily started 10/31/2017  Tamoxifen toxicities:Denies any hot flashes or myalgias. Obesity: Patient blames tamoxifen for inability to lose weight  Breast cancer surveillance: 1.  Mammogram scheduled for 11/08/2018, mammogram done 05/08/2018: No significant change in the DCIS 2.  Breast exam 05/09/2018: Benign  Return to clinic in  1 year for follow-up

## 2018-11-01 ENCOUNTER — Telehealth: Payer: Self-pay | Admitting: Hematology and Oncology

## 2018-11-01 NOTE — Telephone Encounter (Signed)
I talk with patient regarding phone visit  °

## 2018-11-06 ENCOUNTER — Telehealth: Payer: Self-pay | Admitting: Hematology and Oncology

## 2018-11-06 NOTE — Telephone Encounter (Signed)
Confirmed appt and verified info. °

## 2018-11-06 NOTE — Progress Notes (Signed)
  HEMATOLOGY-ONCOLOGY TELEPHONE VISIT PROGRESS NOTE  I connected with Elizabeth [name] on 11/07/2018 at  8:45 AM EDT by telephone and verified that I am speaking with the correct person using two identifiers.  I discussed the limitations, risks, security and privacy concerns of performing an evaluation and management service by telephone and the availability of in person appointments.  I also discussed with the patient that there may be a patient responsible charge related to this service. The patient expressed understanding and agreed to proceed.   History of Present Illness: Elizabeth [name] is a 73 y.o. female with above-mentioned history of left breast DCIS who declined surgery and is currently on active surveillance with tamoxifen. She presents to the clinic today for 6 month follow-up.  Occ breast discomfort.  Oncology History  Ductal carcinoma in situ (DCIS) of left breast  10/23/2017 Initial Diagnosis   Screening detected 5 mm indeterminate mass left breast upper outer quadrant biopsy revealed intermediate grade DCIS ER 100%, PR 100%, Tis N0 stage 0   11/08/2017 -  Anti-estrogen oral therapy   Tamoxifen as part of active surveillance because the patient does not want to undergo surgery at this time.     Observations/Objective:  No clinical signs or symptoms of breast cancer recurrence   Assessment Plan:  Ductal carcinoma in situ (DCIS) of left breast 10/23/2017:Screening detected 5 mm indeterminate mass left breast upper outer quadrant biopsy revealed intermediate grade DCIS ER 100%, PR 100%, Tis N0 stage 0  Treatment plan: Active surveillance with tamoxifen and mammograms every 6 months. Patient decided not to undergo surgery. Current treatment: Tamoxifen 20 mg daily started 10/31/2017  Tamoxifen toxicities:Denies any hot flashes or myalgias. Obesity: Patient blames tamoxifen for inability to lose weight  Breast cancer surveillance: 1.  Mammogram scheduled for  11/08/2018, mammogram done 05/08/2018: No significant change in the DCIS 2.  Breast exam 05/09/2018: Benign  Return to clinic in  1 year for follow-up  I discussed the assessment and treatment plan with the patient. The patient was provided an opportunity to ask questions and all were answered. The patient agreed with the plan and demonstrated an understanding of the instructions. The patient was advised to call back or seek an in-person evaluation if the symptoms worsen or if the condition fails to improve as anticipated.   I provided 15 minutes of non-face-to-face time during this encounter.   Rulon Eisenmenger, MD 11/07/2018    I, Molly Dorshimer, am acting as scribe for Nicholas Lose, MD.  I have reviewed the above documentation for accuracy and completeness, and I agree with the above.

## 2018-11-07 ENCOUNTER — Inpatient Hospital Stay: Payer: Medicare Other | Attending: Hematology and Oncology | Admitting: Hematology and Oncology

## 2018-11-07 DIAGNOSIS — Z79810 Long term (current) use of selective estrogen receptor modulators (SERMs): Secondary | ICD-10-CM | POA: Insufficient documentation

## 2018-11-07 DIAGNOSIS — Z170 Estrogen receptor positive status [ER+]: Secondary | ICD-10-CM

## 2018-11-07 DIAGNOSIS — D0512 Intraductal carcinoma in situ of left breast: Secondary | ICD-10-CM | POA: Diagnosis not present

## 2018-11-07 DIAGNOSIS — E669 Obesity, unspecified: Secondary | ICD-10-CM | POA: Diagnosis not present

## 2018-11-07 MED ORDER — TAMOXIFEN CITRATE 20 MG PO TABS: 20.0000 mg | ORAL_TABLET | Freq: Every day | ORAL | 3 refills | Status: DC

## 2018-11-08 ENCOUNTER — Telehealth: Payer: Self-pay | Admitting: Hematology and Oncology

## 2018-11-08 ENCOUNTER — Other Ambulatory Visit: Payer: Self-pay

## 2018-11-08 ENCOUNTER — Ambulatory Visit
Admission: RE | Admit: 2018-11-08 | Discharge: 2018-11-08 | Disposition: A | Discharge status: Home / Self Care | Payer: Medicare Other | Source: Ambulatory Visit | Attending: Hematology and Oncology | Admitting: Hematology and Oncology

## 2018-11-08 DIAGNOSIS — D0512 Intraductal carcinoma in situ of left breast: Secondary | ICD-10-CM

## 2018-11-08 DIAGNOSIS — R922 Inconclusive mammogram: Secondary | ICD-10-CM | POA: Diagnosis not present

## 2018-11-08 IMAGING — MG DIGITAL DIAGNOSTIC BILATERAL MAMMOGRAM WITH TOMO AND CAD
8 series · 8 of 24 positions shown · non-contrast
Comparison: [DATE] and earlier

CLINICAL DATA: Stereotactic guided core biopsy of the LEFT breast
revealed intermediate grade ductal carcinoma in situ in the
UPPER-OUTER QUADRANT LEFT breast.Patient declined surgery and is
receiving tamoxifen.

EXAM:
DIGITAL DIAGNOSTIC BILATERAL MAMMOGRAM WITH CAD AND TOMO

[L MLO synth-2D]
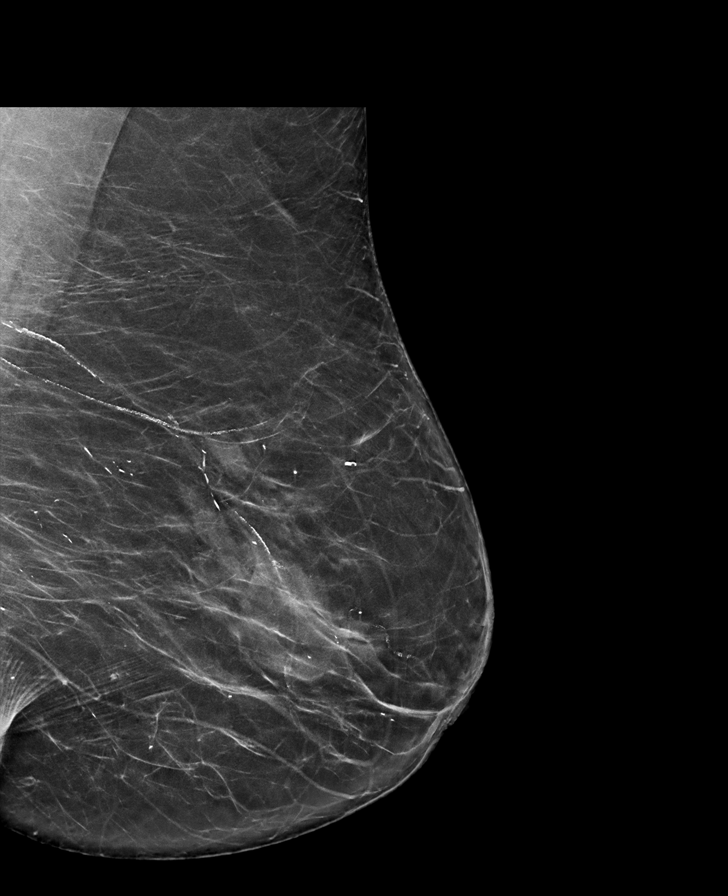

[R MLO synth-2D]
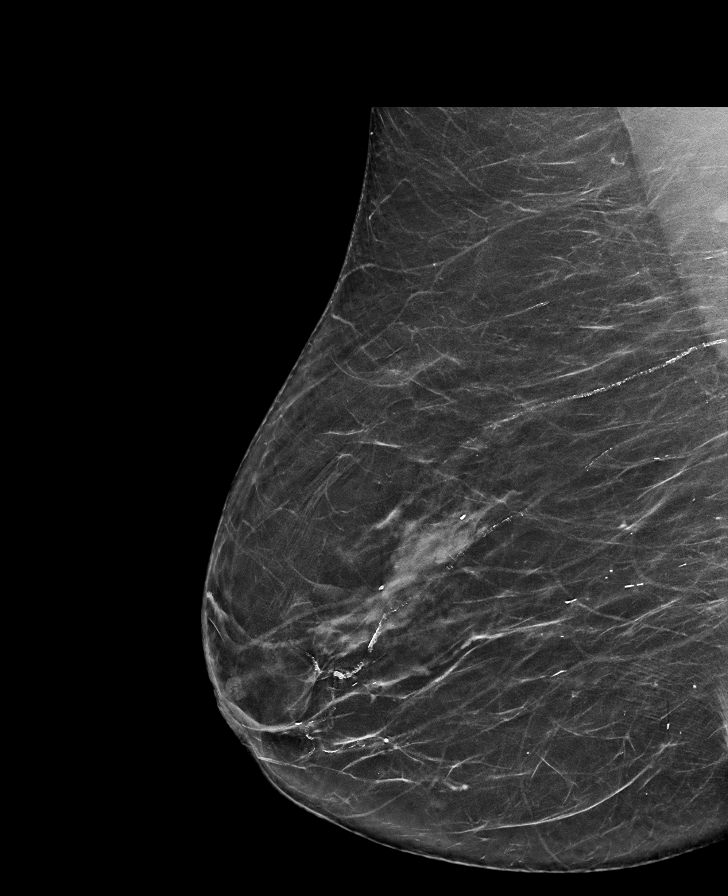

[L CC synth-2D]
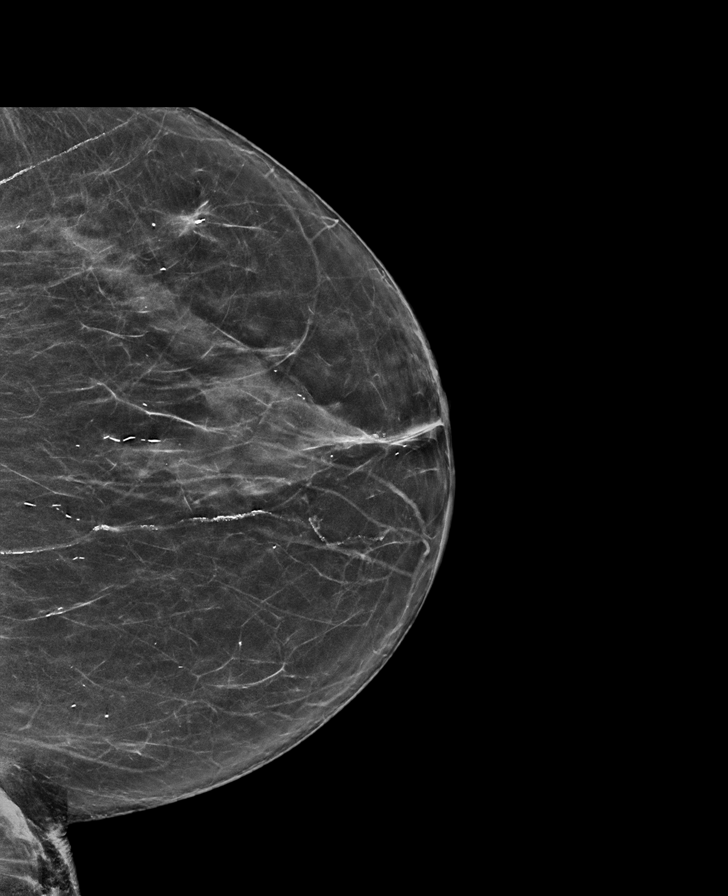

[R CC synth-2D]
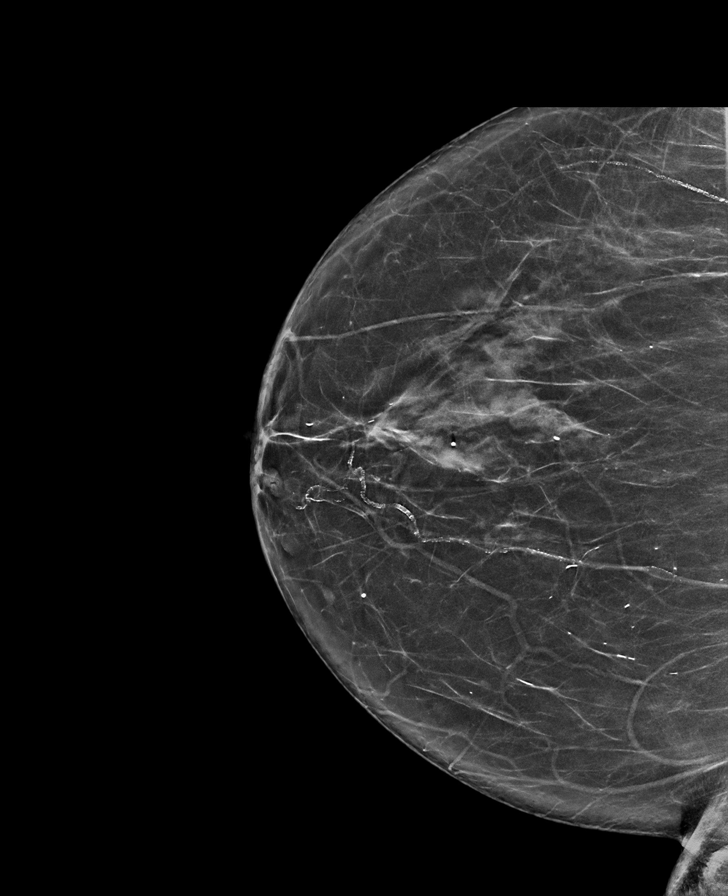

[R MLO tomo · tomo slice 41/80.0]
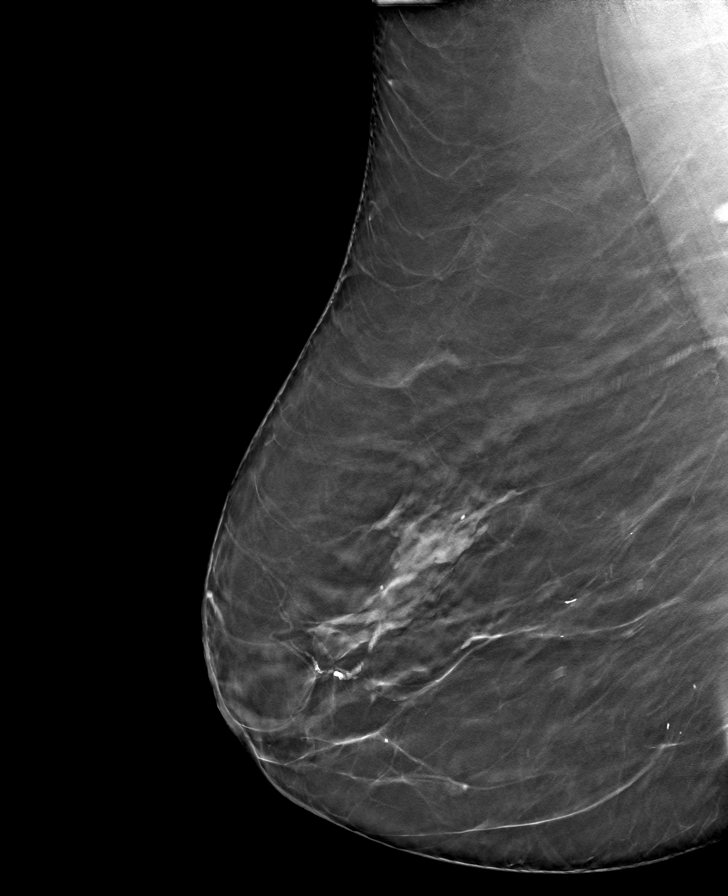

[L CC tomo · tomo slice 34/67.0]
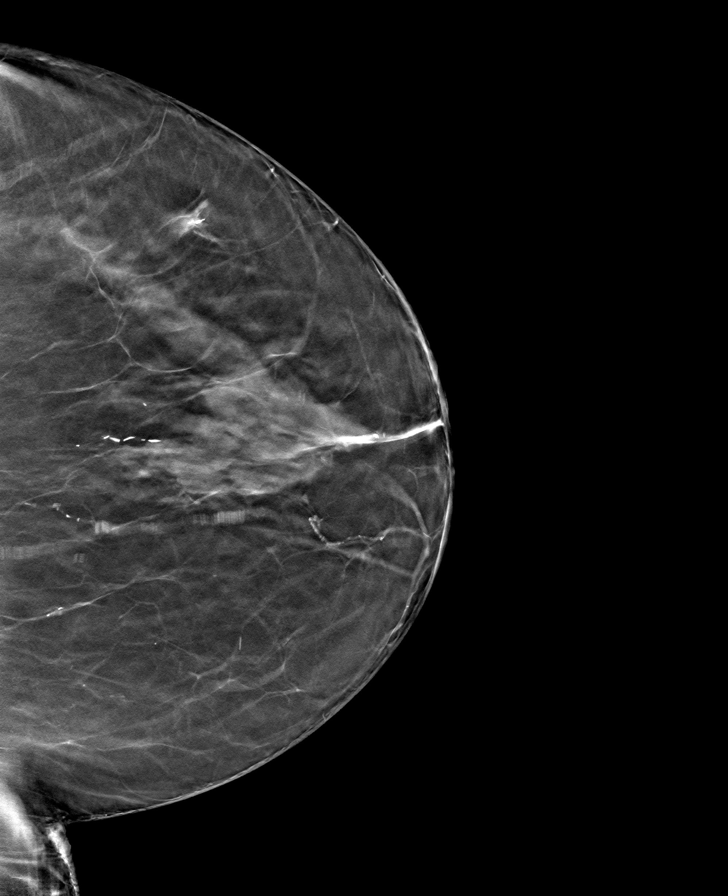

[L MLO tomo · tomo slice 41/81.0]
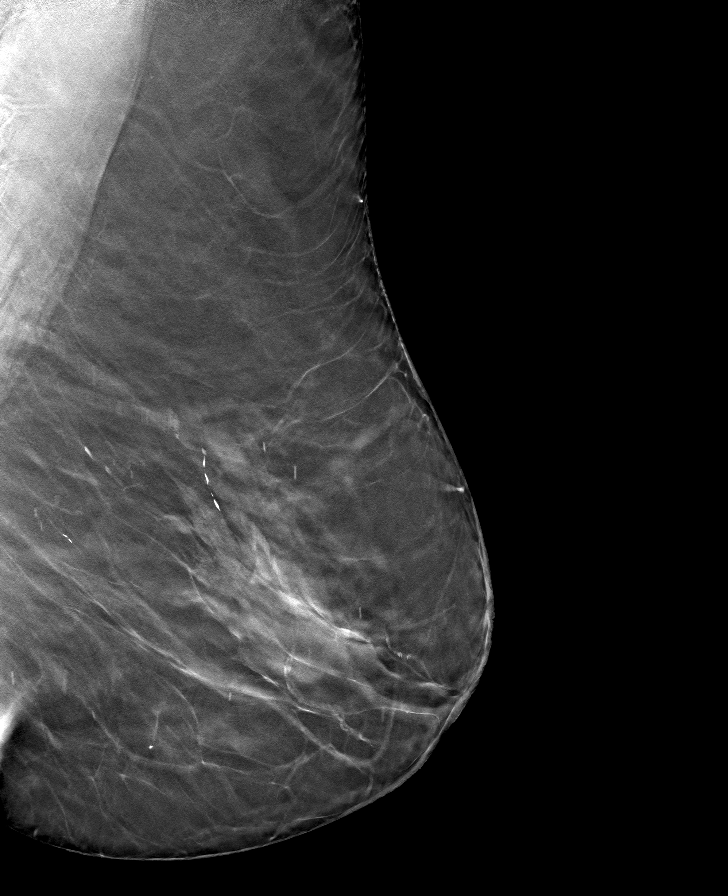

[R CC tomo · tomo slice 36/71.0]
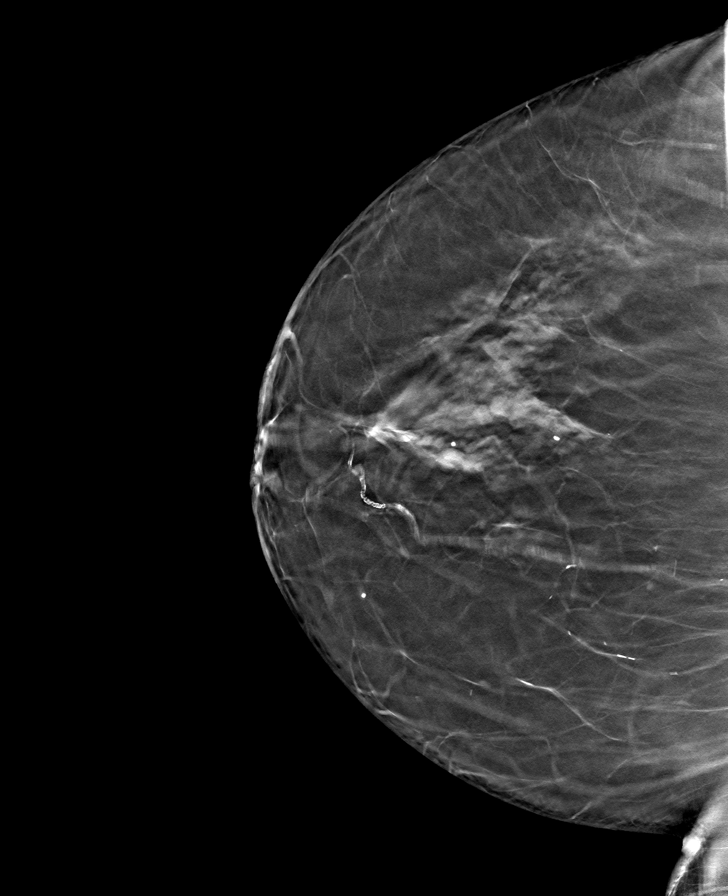

[8 of 24 positions shown; findings below may reference images not displayed]

ACR Breast Density Category c: The breast tissue is heterogeneously
dense, which may obscure small masses.
FINDINGS: No persistent asymmetry identified in the UPPER OUTER QUADRANT of
the LEFT breast, associated with a biopsy clip and unchanged
compared with the prior study. RIGHT breast is negative.

Mammographic images were processed with CAD.
IMPRESSION: Stable focal asymmetry in the UPPER-OUTER QUADRANT of the LEFT
breast, known to represent ductal carcinoma in situ. RIGHT breast is
negative.

RECOMMENDATION:
Diagnostic mammogram is suggested in 1 year. (Code:[CR])

I have discussed the findings and recommendations with the patient.
Results were also provided in writing at the conclusion of the
visit. If applicable, a reminder letter will be sent to the patient
regarding the next appointment.

BI-RADS CATEGORY  6: Known biopsy-proven malignancy.

## 2018-11-08 NOTE — Telephone Encounter (Signed)
I left a message regarding schedule  

## 2018-11-09 DIAGNOSIS — M1712 Unilateral primary osteoarthritis, left knee: Secondary | ICD-10-CM | POA: Diagnosis not present

## 2018-11-12 ENCOUNTER — Ambulatory Visit: Payer: Medicare Other | Admitting: Cardiology

## 2018-11-12 DIAGNOSIS — E782 Mixed hyperlipidemia: Secondary | ICD-10-CM | POA: Diagnosis not present

## 2018-11-12 DIAGNOSIS — I5030 Unspecified diastolic (congestive) heart failure: Secondary | ICD-10-CM | POA: Diagnosis not present

## 2018-11-13 ENCOUNTER — Encounter: Payer: Self-pay | Admitting: Podiatry

## 2018-11-13 ENCOUNTER — Other Ambulatory Visit: Payer: Self-pay

## 2018-11-13 ENCOUNTER — Ambulatory Visit (INDEPENDENT_AMBULATORY_CARE_PROVIDER_SITE_OTHER): Payer: Medicare Other | Admitting: Podiatry

## 2018-11-13 VITALS — Temp 97.0°F | Resp 16

## 2018-11-13 DIAGNOSIS — G5763 Lesion of plantar nerve, bilateral lower limbs: Secondary | ICD-10-CM

## 2018-11-25 NOTE — Progress Notes (Signed)
Subjective:  Patient ID: Elizabeth [name], female    DOB: 07-25-1945,  MRN: [id number]  Chief Complaint  Patient presents with  . Neuroma    F/U BL Neuroma Pt. states," nothing has changed with my feet, no improvement." Tx: noen -w/ swellign     73 y.o. female presents with the above complaint. Hx as above. Does have temporary improvement with injections but not long term.  Review of Systems: Negative except as noted in the HPI. Denies N/V/F/Ch.  Past Medical History:  Diagnosis Date  . Anxiety    extreme  . Aortic valve disorder   . Benign hypertension with CKD (chronic kidney disease), stage II   . Claustrophobia   . Edema   . GERD (gastroesophageal reflux disease)   . Heart murmur   . Hyperuricemia   . Lumbar stenosis   . Osteoarthrosis   . Vitamin D deficiency     Current Outpatient Medications:  .  amLODipine (NORVASC) 5 MG tablet, Take 5 mg by mouth., Disp: , Rfl:  .  carvedilol (COREG) 6.25 MG tablet, TK 1 T PO  BID, Disp: , Rfl:  .  furosemide (LASIX) 20 MG tablet, Take 20 mg by mouth., Disp: , Rfl:  .  lisinopril (ZESTRIL) 10 MG tablet, TK 1 T PO  QD, Disp: , Rfl:  .  tamoxifen (NOLVADEX) 20 MG tablet, Take 1 tablet (20 mg total) by mouth daily., Disp: 90 tablet, Rfl: 3 .  traMADol (ULTRAM) 50 MG tablet, TK 1 T PO Q 12 H PRN, Disp: , Rfl:   Social History   Tobacco Use  Smoking Status Never Smoker  Smokeless Tobacco Never Used    Allergies  Allergen Reactions  . Aspirin Itching  . Codeine Nausea And Vomiting  . Hydrocodone Nausea And Vomiting  . Lipitor [Atorvastatin] Itching and Other (See Comments)    Redness and itching all over body  . Penicillins Hives    Has patient had a PCN reaction causing immediate rash, facial/tongue/throat swelling, SOB or lightheadedness with hypotension: Yes Has patient had a PCN reaction causing severe rash involving mucus membranes or skin necrosis: Yes Has patient had a PCN reaction that required hospitalization: No  Has patient had a PCN reaction occurring within the last 10 years: No If all of the above answers are "NO", then may proceed with Cephalosporin use.   . Prednisone Other (See Comments)    Red and hot all over    Objective:   Vitals:   11/13/18 1024  Resp: 16  Temp: (!) 97 F (36.1 C)   There is no height or weight on file to calculate BMI. Constitutional Well developed. Well nourished.  Vascular Dorsalis pedis pulses palpable bilaterally. Posterior tibial pulses palpable bilaterally. Capillary refill normal to all digits.  No cyanosis or clubbing noted. Pedal hair growth normal.  Neurologic Normal speech. Oriented to person, place, and time. Epicritic sensation to light touch grossly present bilaterally.  Dermatologic Nails well groomed and normal in appearance. No open wounds. Punctate keratosis right 5th MPJ plantar to the metatarsal head.  Orthopedic: POP 3rd interspace bilat with Mulder's click. No pain to palpation 5th MPJ right   Radiographs: None today Assessment:   1. Morton's neuroma of both feet    Plan:  Patient was evaluated and treated and all questions answered.  Interdigital Neuroma, bilaterally -Commence sclerosing injection.  Procedure: Neurolysis Location: Bilateral 3rd interspace Skin Prep: Alcohol. Injectate: 4% alcohol sclerosing injection. Disposition: Patient tolerated procedure well. Injection site dressed  with a band-aid.   No follow-ups on file.

## 2018-11-27 DIAGNOSIS — I5030 Unspecified diastolic (congestive) heart failure: Secondary | ICD-10-CM | POA: Diagnosis not present

## 2018-11-27 DIAGNOSIS — M159 Polyosteoarthritis, unspecified: Secondary | ICD-10-CM | POA: Diagnosis not present

## 2018-12-04 ENCOUNTER — Ambulatory Visit: Payer: Medicare Other | Admitting: Podiatry

## 2018-12-11 ENCOUNTER — Other Ambulatory Visit: Payer: Self-pay

## 2018-12-11 ENCOUNTER — Ambulatory Visit: Payer: Medicare Other | Admitting: Podiatry

## 2018-12-11 DIAGNOSIS — M7751 Other enthesopathy of right foot: Secondary | ICD-10-CM | POA: Diagnosis not present

## 2018-12-11 DIAGNOSIS — B070 Plantar wart: Secondary | ICD-10-CM | POA: Diagnosis not present

## 2018-12-11 DIAGNOSIS — G5763 Lesion of plantar nerve, bilateral lower limbs: Secondary | ICD-10-CM | POA: Diagnosis not present

## 2018-12-11 NOTE — Progress Notes (Signed)
Subjective:  Patient ID: Elizabeth [name], female    DOB: 10-08-1945,  MRN: [id number]  Chief Complaint  Patient presents with  . Neuroma    F/U BL neuroma Pt. states," the stiffness is worse than what it was, the bottom of my feet stay swollen; 7/10 shapr pains." tx: stretchihng, icng, OTC topical rubs, rolling frozen bottles =w/ swellign     73 y.o. female presents with the above complaint. Hx as above. States the injection hurt more.  Review of Systems: Negative except as noted in the HPI. Denies N/V/F/Ch.  Past Medical History:  Diagnosis Date  . Anxiety    extreme  . Aortic valve disorder   . Benign hypertension with CKD (chronic kidney disease), stage II   . Claustrophobia   . Edema   . GERD (gastroesophageal reflux disease)   . Heart murmur   . Hyperuricemia   . Lumbar stenosis   . Osteoarthrosis   . Vitamin D deficiency     Current Outpatient Medications:  .  amLODipine (NORVASC) 5 MG tablet, Take 5 mg by mouth., Disp: , Rfl:  .  carvedilol (COREG) 6.25 MG tablet, TK 1 T PO  BID, Disp: , Rfl:  .  furosemide (LASIX) 20 MG tablet, Take 20 mg by mouth., Disp: , Rfl:  .  lisinopril (ZESTRIL) 10 MG tablet, TK 1 T PO  QD, Disp: , Rfl:  .  omeprazole (PRILOSEC) 40 MG capsule, TK 1 C PO D, Disp: , Rfl:  .  predniSONE (DELTASONE) 20 MG tablet, TK 2 TS PO D, Disp: , Rfl:  .  tamoxifen (NOLVADEX) 20 MG tablet, Take 1 tablet (20 mg total) by mouth daily., Disp: 90 tablet, Rfl: 3 .  traMADol (ULTRAM) 50 MG tablet, TK 1 T PO Q 12 H PRN, Disp: , Rfl:   Social History   Tobacco Use  Smoking Status Never Smoker  Smokeless Tobacco Never Used    Allergies  Allergen Reactions  . Aspirin Itching  . Codeine Nausea And Vomiting  . Hydrocodone Nausea And Vomiting  . Lipitor [Atorvastatin] Itching and Other (See Comments)    Redness and itching all over body  . Penicillins Hives    Has patient had a PCN reaction causing immediate rash, facial/tongue/throat swelling, SOB or  lightheadedness with hypotension: Yes Has patient had a PCN reaction causing severe rash involving mucus membranes or skin necrosis: Yes Has patient had a PCN reaction that required hospitalization: No Has patient had a PCN reaction occurring within the last 10 years: No If all of the above answers are "NO", then may proceed with Cephalosporin use.   . Prednisone Other (See Comments)    Red and hot all over    Objective:   There were no vitals filed for this visit. There is no height or weight on file to calculate BMI. Constitutional Well developed. Well nourished.  Vascular Dorsalis pedis pulses palpable bilaterally. Posterior tibial pulses palpable bilaterally. Capillary refill normal to all digits.  No cyanosis or clubbing noted. Pedal hair growth normal.  Neurologic Normal speech. Oriented to person, place, and time. Epicritic sensation to light touch grossly present bilaterally.  Dermatologic Nails well groomed and normal in appearance. No open wounds. Punctate keratosis right heel  Orthopedic: POP 3rd interspace bilat with Mulder's click. No pain to palpation 5th MPJ right   Radiographs: None today Assessment:   1. Morton's neuroma of both feet   2. Capsulitis of metatarsophalangeal (MTP) joint of right foot   3. Verruca  plantaris    Plan:  Patient was evaluated and treated and all questions answered.  Interdigital Neuroma, bilaterally -No injection today. States the injection hurt more -May benefit from surgical intervention depending upon size of the nerve. MRI ordered right foot for evaluation, pre-op planning.   Verruca/porokeratosis right heel -Debrided with 312 blade ot patient relief, destroyed with salinocaine.  No follow-ups on file.

## 2018-12-12 ENCOUNTER — Telehealth: Payer: Self-pay | Admitting: *Deleted

## 2018-12-12 DIAGNOSIS — M159 Polyosteoarthritis, unspecified: Secondary | ICD-10-CM | POA: Diagnosis not present

## 2018-12-12 DIAGNOSIS — E782 Mixed hyperlipidemia: Secondary | ICD-10-CM | POA: Diagnosis not present

## 2018-12-12 DIAGNOSIS — G5763 Lesion of plantar nerve, bilateral lower limbs: Secondary | ICD-10-CM

## 2018-12-12 NOTE — Telephone Encounter (Signed)
Orders to L. Cox, CMA for pre-cert. 

## 2018-12-12 NOTE — Telephone Encounter (Signed)
-----   Message from Evelina Bucy, DPM sent at 12/11/2018  2:34 PM EDT ----- Can we order an MRI right foot eval for neuroma

## 2018-12-13 ENCOUNTER — Telehealth: Payer: Self-pay | Admitting: *Deleted

## 2018-12-13 DIAGNOSIS — M25562 Pain in left knee: Secondary | ICD-10-CM | POA: Diagnosis not present

## 2018-12-13 DIAGNOSIS — M5116 Intervertebral disc disorders with radiculopathy, lumbar region: Secondary | ICD-10-CM | POA: Diagnosis not present

## 2018-12-13 DIAGNOSIS — G8929 Other chronic pain: Secondary | ICD-10-CM | POA: Diagnosis not present

## 2018-12-13 DIAGNOSIS — M25561 Pain in right knee: Secondary | ICD-10-CM | POA: Diagnosis not present

## 2018-12-13 NOTE — Telephone Encounter (Signed)
Ok thank you for letting me know. Please extend our condolensces

## 2018-12-13 NOTE — Telephone Encounter (Signed)
I called pt and she states she can not make the Jan 08, 2019 appt, her brother just died and she will have to go to [place]. I offered to cancel the 01-08-2019 appt and for her to call me again and I would reschedule.

## 2018-12-13 NOTE — Telephone Encounter (Signed)
Called and spoke with Charleston Ropes from [facility] and representative [facility]) stated that there is no prior authorization needed. Elizabeth [name]

## 2018-12-13 NOTE — Telephone Encounter (Signed)
[place] scheduled [phone] right foot with and without contrast on 12/17/2018 arrive at outpt lab 11:45am then to imaging center 12:30pm for 12:45pm imaging. Faxed to [facility].

## 2018-12-13 NOTE — Telephone Encounter (Signed)
I cancelled pt's 12/17/2018 [facility] appt with Kathrin Ruddy.

## 2018-12-20 ENCOUNTER — Other Ambulatory Visit: Payer: Self-pay

## 2018-12-20 ENCOUNTER — Encounter: Payer: Self-pay | Admitting: Cardiology

## 2018-12-20 ENCOUNTER — Ambulatory Visit: Payer: Medicare Other | Admitting: Cardiology

## 2018-12-20 VITALS — BP 134/80 | HR 61 | Ht 65.5 in | Wt 256.0 lb

## 2018-12-20 DIAGNOSIS — I509 Heart failure, unspecified: Secondary | ICD-10-CM | POA: Diagnosis not present

## 2018-12-20 DIAGNOSIS — I5032 Chronic diastolic (congestive) heart failure: Secondary | ICD-10-CM

## 2018-12-20 DIAGNOSIS — I350 Nonrheumatic aortic (valve) stenosis: Secondary | ICD-10-CM | POA: Diagnosis not present

## 2018-12-20 HISTORY — DX: Chronic diastolic (congestive) heart failure: I50.32

## 2018-12-20 NOTE — Patient Instructions (Signed)
Medication Instructions:  Your physician recommends that you continue on your current medications as directed. Please refer to the Current Medication list given to you today.  If you need a refill on your cardiac medications before your next appointment, please call your pharmacy.   Lab work: Your physician recommends that you return for lab work today: BMP, ProBNP.   If you have labs (blood work) drawn today and your tests are completely normal, you will receive your results only by: Marland Kitchen MyChart Message (if you have MyChart) OR . A paper copy in the mail If you have any lab test that is abnormal or we need to change your treatment, we will call you to review the results.  Testing/Procedures: You had an EKG today.   Your physician has requested that you have an echocardiogram. Echocardiography is a painless test that uses sound waves to create images of your heart. It provides your doctor with information about the size and shape of your heart and how well your heart's chambers and valves are working. This procedure takes approximately one hour. There are no restrictions for this procedure.  Follow-Up: At [facility], you and your health needs are our priority.  As part of our continuing mission to provide you with exceptional heart care, we have created designated Provider Care Teams.  These Care Teams include your primary Cardiologist (physician) and Advanced Practice Providers (APPs -  Physician Assistants and Nurse Practitioners) who all work together to provide you with the care you need, when you need it. You will need a follow up appointment in 3 weeks.       Echocardiogram An echocardiogram is a procedure that uses painless sound waves (ultrasound) to produce an image of the heart. Images from an echocardiogram can provide important information about:  Signs of coronary artery disease (CAD).  Aneurysm detection. An aneurysm is a weak or damaged part of an artery wall that  bulges out from the normal force of blood pumping through the body.  Heart size and shape. Changes in the size or shape of the heart can be associated with certain conditions, including heart failure, aneurysm, and CAD.  Heart muscle function.  Heart valve function.  Signs of a past heart attack.  Fluid buildup around the heart.  Thickening of the heart muscle.  A tumor or infectious growth around the heart valves. Tell a health care provider about:  Any allergies you have.  All medicines you are taking, including vitamins, herbs, eye drops, creams, and over-the-counter medicines.  Any blood disorders you have.  Any surgeries you have had.  Any medical conditions you have.  Whether you are pregnant or may be pregnant. What are the risks? Generally, this is a safe procedure. However, problems may occur, including:  Allergic reaction to dye (contrast) that may be used during the procedure. What happens before the procedure? No specific preparation is needed. You may eat and drink normally. What happens during the procedure?   An IV tube may be inserted into one of your veins.  You may receive contrast through this tube. A contrast is an injection that improves the quality of the pictures from your heart.  A gel will be applied to your chest.  A wand-like tool (transducer) will be moved over your chest. The gel will help to transmit the sound waves from the transducer.  The sound waves will harmlessly bounce off of your heart to allow the heart images to be captured in real-time motion. The images  will be recorded on a computer. The procedure may vary among health care providers and hospitals. What happens after the procedure?  You may return to your normal, everyday life, including diet, activities, and medicines, unless your health care provider tells you not to do that. Summary  An echocardiogram is a procedure that uses painless sound waves (ultrasound) to produce  an image of the heart.  Images from an echocardiogram can provide important information about the size and shape of your heart, heart muscle function, heart valve function, and fluid buildup around your heart.  You do not need to do anything to prepare before this procedure. You may eat and drink normally.  After the echocardiogram is completed, you may return to your normal, everyday life, unless your health care provider tells you not to do that. This information is not intended to replace advice given to you by your health care provider. Make sure you discuss any questions you have with your health care provider. Document Released: 02/26/2000 Document Revised: 06/21/2018 Document Reviewed: 04/02/2016 Elsevier Patient Education  2020 [employer].

## 2018-12-20 NOTE — Progress Notes (Signed)
Cardiology Office Note:    Date:  12/20/2018   ID:  Elizabeth [name], Elizabeth [name] 06/17/1945, MRN [id number]  PCP:  Elizabeth Collie, MD  Cardiologist:  Elizabeth More, MD   Referring MD: Elizabeth Collie, MD  ASSESSMENT:    1. New onset of congestive heart failure ([place])   2. Aortic valve stenosis, etiology of cardiac valve disease unspecified   3. Chronic diastolic heart failure (HCC)    PLAN:    In order of problems listed above:  1. Heart failure presently is compensated continue current diuretic and recheck labs including BMP proBNP level. 2. Echocardiogram at [facility] showed severe aortic stenosis clinically I suspect she has a bicuspid valve and like to do a repeat get better view of the leaflets as it affects planning for TAVR or surgical aortic valve replacement and also do quantitatives including velocity time interval ratio to be sure that she has severe aortic stenosis initiated discussion of performing TAVR and I hand wrote a note for to give her daughter who is a Marine scientist in the area.  Next appointment 4 weeks   Medication Adjustments/Labs and Tests Ordered: Current medicines are reviewed at length with the patient today.  Concerns regarding medicines are outlined above.  Orders Placed This Encounter  Procedures   Basic Metabolic Panel (BMET)   Pro b natriuretic peptide (BNP)   EKG 12-Lead   ECHOCARDIOGRAM COMPLETE   No orders of the defined types were placed in this encounter.    No chief complaint on file.   History of Present Illness:    Elizabeth [name] is a 73 y.o. female who is being seen today for the evaluation of heart failure at the request of Elizabeth Collie, MD.  She had an echocardiogram performed in [facility] 09/09/2018 and showed normal left ventricular size mild concentric left ventricular hypertrophy ejection fraction systolic function normal 55 to 60% pseudo-normal diastolic function with elevated left atrial pressure.  Left atrium was  moderately enlarged she had mild aortic regurgitation and the report says she has severe valvular aortic stenosis.  The peak velocity across the valve was 4.2 m/s squared the valve area is in the range of 0.6 to 0.7 cm.  That day she had gone for hike push himself hard physically afterwards felt badly was short of breath heart rate was 130 bpm blood pressure elevated her daughter is a nurse works in the emergency room and [facility] brought her to the emergency room.  Her EKG showed sinus tachycardia rate of 130 bpm is quite hypertensive she had a BNP level greater than 4000 and although she had an echocardiogram does not remember being told she has severe aortic stenosis.  Was found to be in radiographic heart failure.  She received IV diuretic was improved and although she had an echocardiogram and although she had severity of stenosis does not remember being told that diagnosis.  She has a history of a heart murmur since she is 73 years old but is never had any evaluation.  The valve was described as trileaflet clinically she has a bicuspid valve and prior to forwarding her for cardiac interventions I like to recheck her echocardiogram for better confirmation of the severity of aortic stenosis including velocity time interval ratios.  Her creatinine initially there was 0.9 at discharge 1.6 after receiving CTA contrast dye CBC was normal troponins were normal.  Follow-up creatinine in July was 1.0 in her PCP office.  Presently she has no edema orthopnea shortness of breath  chest pain or syncope.  She has lost 10 pounds with her diuretic therapy. Past Medical History:  Diagnosis Date   Anxiety    extreme   Aortic valve disorder    Benign hypertension with CKD (chronic kidney disease), stage II    Claustrophobia    Edema    GERD (gastroesophageal reflux disease)    Heart murmur    Hyperuricemia    Lumbar stenosis    Osteoarthrosis    Vitamin D deficiency     Past Surgical History:    Procedure Laterality Date   ABDOMINAL HYSTERECTOMY     APPENDECTOMY     GALLBLADDER SURGERY     LUMBAR LAMINECTOMY/DECOMPRESSION MICRODISCECTOMY N/A 06/12/2017   Procedure: LAMINECTOMY AND FORAMINOTOMY LUMBAR THREE- LUMBAR FOUR, LUMBAR FOUR- LUMBAR FIVE;  Surgeon: Elizabeth Pies, MD;  Location: [facility];  Service: Neurosurgery;  Laterality: N/A;    Current Medications: Current Meds  Medication Sig   amLODipine (NORVASC) 5 MG tablet Take 5 mg by mouth.   carvedilol (COREG) 6.25 MG tablet TK 1 T PO  BID   furosemide (LASIX) 20 MG tablet Take 20 mg by mouth daily.    lisinopril (ZESTRIL) 10 MG tablet TK 1 T PO  QD   omeprazole (PRILOSEC) 40 MG capsule TK 1 C PO D   tamoxifen (NOLVADEX) 20 MG tablet Take 1 tablet (20 mg total) by mouth daily.   traMADol (ULTRAM) 50 MG tablet TK 1 T PO Q 12 H PRN     Allergies:   Aspirin, Codeine, Lipitor [atorvastatin], Penicillins, and Prednisone   Social History   Socioeconomic History   Marital status: Married    Spouse name: Not on file   Number of children: 2   Years of education: 12   Highest education level: Not on file  Occupational History   Occupation: child care  Social Needs   Emergency planning/management officer strain: Not on file   Food insecurity    Worry: Not on file    Inability: Not on file   Transportation needs    Medical: Not on file    Non-medical: Not on file  Tobacco Use   Smoking status: Never Smoker   Smokeless tobacco: Never Used  Substance and Sexual Activity   Alcohol use: No    Frequency: Never   Drug use: No   Sexual activity: Not on file  Lifestyle   Physical activity    Days per week: Not on file    Minutes per session: Not on file   Stress: Not on file  Relationships   Social connections    Talks on phone: Not on file    Gets together: Not on file    Attends religious service: Not on file    Active member of club or organization: Not on file    Attends meetings of clubs or  organizations: Not on file    Relationship status: Not on file  Other Topics Concern   Not on file  Social History Narrative   Lives with husband in a one story home.  Has 2 children.  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  Education: high school.       Family History: The patient's family history includes Alzheimer's disease in her mother; Aneurysm in her father; Stomach cancer in her brother.  ROS:   ROS Please see the history of present illness.     All other systems reviewed and are negative.  EKGs/Labs/Other Studies Reviewed:    The following studies were  reviewed today:   EKG:  EKG is  ordered today.  The ekg ordered today is personally reviewed and demonstrates sinus rhythm nonspecific ST-T abnormality 1 PVC  Recent Labs: No results found for requested labs within last 8760 hours.  Recent Lipid Panel No results found for: CHOL, TRIG, HDL, CHOLHDL, VLDL, LDLCALC, LDLDIRECT  Physical Exam:    VS:  BP 134/80 (BP Location: Left Arm, Patient Position: Sitting, Cuff Size: Large)    Pulse 61    Ht 5' 5.5" (1.664 m)    Wt 256 lb (116.1 kg)    SpO2 98%    BMI 41.95 kg/m     Wt Readings from Last 3 Encounters:  12/20/18 256 lb (116.1 kg)  05/09/18 267 lb 6.4 oz (121.3 kg)  02/05/18 260 lb 14.4 oz (118.3 kg)     GEN: Obese well nourished, well developed in no acute distress HEENT: Normal NECK: No JVD; No carotid bruits LYMPHATICS: No lymphadenopathy CARDIAC: Grade 346 harsh  holosystolic murmur encompassing S2 radiating to the carotids with diminished carotid upstroke and delayed upstroke S2 is single no aortic regurgitation RRR,  RESPIRATORY:  Clear to auscultation without rales, wheezing or rhonchi  ABDOMEN: Soft, non-tender, non-distended MUSCULOSKELETAL:  No edema; No deformity  SKIN: Warm and dry NEUROLOGIC:  Alert and oriented x 3 PSYCHIATRIC:  Normal affect     Signed, Elizabeth More, MD  12/20/2018 4:16 PM    [facility]

## 2018-12-21 LAB — BASIC METABOLIC PANEL
BUN/Creatinine Ratio: 19 (ref 12–28)
BUN: 21 mg/dL (ref 8–27)
CO2: 21 mmol/L (ref 20–29)
Calcium: 9.6 mg/dL (ref 8.7–10.3)
Chloride: 109 mmol/L — ABNORMAL HIGH (ref 96–106)
Creatinine, Ser: 1.11 mg/dL — ABNORMAL HIGH (ref 0.57–1.00)
GFR calc Af Amer: 57 mL/min/{1.73_m2} — ABNORMAL LOW (ref >59)
GFR calc non Af Amer: 49 mL/min/{1.73_m2} — ABNORMAL LOW (ref >59)
Glucose: 114 mg/dL — ABNORMAL HIGH (ref 65–99)
Potassium: 4.9 mmol/L (ref 3.5–5.2)
Sodium: 145 mmol/L — ABNORMAL HIGH (ref 134–144)

## 2018-12-21 LAB — PRO B NATRIURETIC PEPTIDE: NT-Pro BNP: 613 pg/mL — ABNORMAL HIGH (ref 0–301)

## 2018-12-26 ENCOUNTER — Other Ambulatory Visit (HOSPITAL_BASED_OUTPATIENT_CLINIC_OR_DEPARTMENT_OTHER): Payer: Medicare Other

## 2018-12-31 ENCOUNTER — Ambulatory Visit (HOSPITAL_BASED_OUTPATIENT_CLINIC_OR_DEPARTMENT_OTHER)
Admission: RE | Admit: 2018-12-31 | Discharge: 2018-12-31 | Disposition: A | Discharge status: Home / Self Care | Payer: Medicare Other | Source: Ambulatory Visit | Attending: Cardiology | Admitting: Cardiology

## 2018-12-31 ENCOUNTER — Other Ambulatory Visit: Payer: Self-pay

## 2018-12-31 DIAGNOSIS — I350 Nonrheumatic aortic (valve) stenosis: Secondary | ICD-10-CM | POA: Diagnosis not present

## 2018-12-31 DIAGNOSIS — I509 Heart failure, unspecified: Secondary | ICD-10-CM | POA: Insufficient documentation

## 2018-12-31 NOTE — Progress Notes (Signed)
  Echocardiogram 2D Echocardiogram has been performed.  Cardell Peach 12/31/2018, 11:58 AM

## 2019-01-01 ENCOUNTER — Other Ambulatory Visit: Payer: Self-pay

## 2019-01-01 MED ORDER — AMLODIPINE BESYLATE 5 MG PO TABS: 5.0000 mg | ORAL_TABLET | Freq: Every day | ORAL | 1 refills | Status: DC

## 2019-01-01 MED ORDER — FUROSEMIDE 20 MG PO TABS: 20.0000 mg | ORAL_TABLET | Freq: Every day | ORAL | 1 refills | Status: DC

## 2019-01-02 ENCOUNTER — Other Ambulatory Visit: Payer: Self-pay

## 2019-01-02 MED ORDER — AMLODIPINE BESYLATE 5 MG PO TABS: 5.0000 mg | ORAL_TABLET | Freq: Every day | ORAL | 0 refills | Status: DC

## 2019-01-02 MED ORDER — FUROSEMIDE 20 MG PO TABS: 20.0000 mg | ORAL_TABLET | Freq: Every day | ORAL | 0 refills | Status: DC

## 2019-01-03 ENCOUNTER — Other Ambulatory Visit (HOSPITAL_BASED_OUTPATIENT_CLINIC_OR_DEPARTMENT_OTHER): Payer: Medicare Other

## 2019-01-08 ENCOUNTER — Ambulatory Visit: Payer: Medicare Other | Admitting: Podiatry

## 2019-01-09 DIAGNOSIS — Z23 Encounter for immunization: Secondary | ICD-10-CM | POA: Diagnosis not present

## 2019-01-11 DIAGNOSIS — E782 Mixed hyperlipidemia: Secondary | ICD-10-CM | POA: Diagnosis not present

## 2019-01-11 DIAGNOSIS — I5030 Unspecified diastolic (congestive) heart failure: Secondary | ICD-10-CM | POA: Diagnosis not present

## 2019-01-15 DIAGNOSIS — M25561 Pain in right knee: Secondary | ICD-10-CM | POA: Diagnosis not present

## 2019-01-15 DIAGNOSIS — M25562 Pain in left knee: Secondary | ICD-10-CM | POA: Diagnosis not present

## 2019-01-15 DIAGNOSIS — M5116 Intervertebral disc disorders with radiculopathy, lumbar region: Secondary | ICD-10-CM | POA: Diagnosis not present

## 2019-01-15 DIAGNOSIS — Z79891 Long term (current) use of opiate analgesic: Secondary | ICD-10-CM | POA: Diagnosis not present

## 2019-01-15 DIAGNOSIS — G8929 Other chronic pain: Secondary | ICD-10-CM | POA: Diagnosis not present

## 2019-01-17 NOTE — Progress Notes (Signed)
Cardiology Office Note:    Date:  01/18/2019   ID:  Elizabeth, [name] 23-Nov-1945, MRN [id number]  PCP:  Marco Collie, MD  Cardiologist:  Shirlee More, MD    Referring MD: Marco Collie, MD    ASSESSMENT:    1. Aortic valve stenosis, etiology of cardiac valve disease unspecified   2. Chronic diastolic heart failure (HCC)    PLAN:    In order of problems listed above:  1. She has severe symptomatic aortic stenosis.  She has high healthcare literacy understand she is symptomatic with aortic stenosis and heart failure presently compensated and agrees to undergo left and right heart catheterization in preparation for intervention hopefully TAVR. 2. Heart failure stable compensated to near current loop diuretic recheck labs including CBC BMP and proBNP level and continue meticulous sodium restriction and daily weights and self-management   Next appointment: 6 weeks   Medication Adjustments/Labs and Tests Ordered: Current medicines are reviewed at length with the patient today.  Concerns regarding medicines are outlined above.  No orders of the defined types were placed in this encounter.  No orders of the defined types were placed in this encounter.   Chief Complaint  Patient presents with  . Follow-up  . Aortic Stenosis    History of Present Illness:    Elizabeth [name] is a 73 y.o. female with a hx of heart failure last seen 12/20/2018. Compliance with diet, lifestyle and medications: Yes  She had an echocardiogram performed in [facility] 09/09/2018 and showed normal left ventricular size mild concentric left ventricular hypertrophy ejection fraction systolic function normal 55 to 60% pseudo-normal diastolic function with elevated left atrial pressure.  Left atrium was moderately enlarged she had mild aortic regurgitation and the report says she has severe valvular aortic stenosis.  The peak velocity across the valve was 4.2 m/s squared the valve area is in  the range of 0.6 to 0.7 cm  I reviewed her echocardiogram below with the patient she voices understanding knows that she has severe symptomatic aortic stenosis agrees to undergo left and right heart catheterization Dr. Burt Knack in preparation for intervention hopefully TAVR. Echo 12/31/2018:  1. Left ventricular ejection fraction, by visual estimation, is 55 to 60%. The left ventricle has normal function. Normal left ventricular size. Left ventricular septal wall thickness was mildly increased. Mildly increased left ventricular posterior  wall thickness. There is mildly increased left ventricular hypertrophy.  2. Elevated mean left atrial pressure.  3. Left ventricular diastolic Doppler parameters are consistent with pseudonormalization pattern of LV diastolic filling.  4. Global right ventricle has normal systolic function.The right ventricular size is normal. No increase in right ventricular wall thickness.  5. Left atrial size was normal.  6. Right atrial size was normal.  7. Mild to moderate mitral annular calcification.  8. The mitral valve is normal in structure. No evidence of mitral valve regurgitation. No evidence of mitral stenosis.  9. The tricuspid valve is normal in structure. Tricuspid valve regurgitation is mild. 10. The aortic valve is bicuspid Aortic valve regurgitation is mild by color flow Doppler. Severe aortic valve stenosis. Aortic Valve: The aortic valve is bicuspid. There is Severely thickening and Moderate calcification of the aortic valve. Aortic valve regurgitation is mild by color flow Doppler. Aortic regurgitation PHT measures 507 msec. Severe aortic stenosis is  present. There is Severely thickening of the aortic valve. Moderate calcification. Aortic valve mean gradient measures 49.2 mmHg. Aortic valve peak gradient measures 69.3 mmHg. Aortic valve  area, by VTI measures 0.58 cm. The peak aortic velocity was obtained from the apical view 11. There is Moderate  calcification of the aortic valve. 12. There is Severely thickening of the aortic valve. 13. The pulmonic valve was normal in structure. Pulmonic valve regurgitation is not visualized by color flow Doppler. 14. There is mild to moderate dilatation of the ascending aorta measuring 39 mm. 15. Normal pulmonary artery systolic pressure. 16. The inferior vena cava is normal in size with greater than 50% respiratory variability, suggesting right atrial pressure of 3 mmHg. He is doing much better her weight is stable no edema shortness of breath chest pain palpitation or syncope.  She is understandably apprehensive but agrees to undergo a left and right heart catheterization 1 day with the week after Thanksgiving to accommodate family schedule.  Her daughter-in-law is a Marine scientist at [employer] and I wrote a note to her describing the findings and plan. Past Medical History:  Diagnosis Date  . Anxiety    extreme  . Aortic valve disorder   . Benign hypertension with CKD (chronic kidney disease), stage II   . Claustrophobia   . Edema   . GERD (gastroesophageal reflux disease)   . Heart murmur   . Hyperuricemia   . Lumbar stenosis   . Osteoarthrosis   . Vitamin D deficiency     Past Surgical History:  Procedure Laterality Date  . ABDOMINAL HYSTERECTOMY    . APPENDECTOMY    . GALLBLADDER SURGERY    . LUMBAR LAMINECTOMY/DECOMPRESSION MICRODISCECTOMY N/A 06/12/2017   Procedure: LAMINECTOMY AND FORAMINOTOMY LUMBAR THREE- LUMBAR FOUR, LUMBAR FOUR- LUMBAR FIVE;  Surgeon: Newman Pies, MD;  Location: [facility];  Service: Neurosurgery;  Laterality: N/A;    Current Medications: Current Meds  Medication Sig  . amLODipine (NORVASC) 5 MG tablet Take 1 tablet (5 mg total) by mouth daily.  . carvedilol (COREG) 6.25 MG tablet TK 1 T PO  BID  . furosemide (LASIX) 20 MG tablet Take 1 tablet (20 mg total) by mouth daily.  Marland Kitchen lisinopril (ZESTRIL) 10 MG tablet TK 1 T PO  QD  . omeprazole (PRILOSEC) 40 MG  capsule Take 40 mg by mouth daily as needed.   . tamoxifen (NOLVADEX) 20 MG tablet Take 1 tablet (20 mg total) by mouth daily.     Allergies:   Aspirin, Codeine, Lipitor [atorvastatin], Penicillins, and Prednisone   Social History   Socioeconomic History  . Marital status: Married    Spouse name: Not on file  . Number of children: 2  . Years of education: 61  . Highest education level: Not on file  Occupational History  . Occupation: child care  Social Needs  . Financial resource strain: Not on file  . Food insecurity    Worry: Not on file    Inability: Not on file  . Transportation needs    Medical: Not on file    Non-medical: Not on file  Tobacco Use  . Smoking status: Never Smoker  . Smokeless tobacco: Never Used  Substance and Sexual Activity  . Alcohol use: No    Frequency: Never  . Drug use: No  . Sexual activity: Not on file  Lifestyle  . Physical activity    Days per week: Not on file    Minutes per session: Not on file  . Stress: Not on file  Relationships  . Social Herbalist on phone: Not on file    Gets together: Not on  file    Attends religious service: Not on file    Active member of club or organization: Not on file    Attends meetings of clubs or organizations: Not on file    Relationship status: Not on file  Other Topics Concern  . Not on file  Social History Narrative   Lives with husband in a one story home.  Has 2 children.  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  Education: high school.       Family History: The patient's family history includes Alzheimer's disease in her mother; Aneurysm in her father; Stomach cancer in her brother. ROS:   Please see the history of present illness.    All other systems reviewed and are negative.  EKGs/Labs/Other Studies Reviewed:    The following studies were reviewed today:  Recent Labs: 12/20/2018: BUN 21; Creatinine, Ser 1.11; NT-Pro BNP 613; Potassium 4.9; Sodium 145  Recent Lipid Panel No  results found for: CHOL, TRIG, HDL, CHOLHDL, VLDL, LDLCALC, LDLDIRECT  Physical Exam:    VS:  BP 140/74 (BP Location: Right Arm, Patient Position: Sitting, Cuff Size: Large)   Ht 5\' 5"  (1.651 m)   Wt 253 lb 3.2 oz (114.9 kg)   BMI 42.13 kg/m     Wt Readings from Last 3 Encounters:  01/18/19 253 lb 3.2 oz (114.9 kg)  12/20/18 256 lb (116.1 kg)  05/09/18 267 lb 6.4 oz (121.3 kg)     GEN:  Well nourished, well developed in no acute distress HEENT: Normal NECK: No JVD; No carotid bruits LYMPHATICS: No lymphadenopathy CARDIAC: 3-4 6 harsh grunting aortic stenosis murmur encompassing S2 RRR, no murmurs, rubs, gallops RESPIRATORY:  Clear to auscultation without rales, wheezing or rhonchi  ABDOMEN: Soft, non-tender, non-distended MUSCULOSKELETAL:  No edema; No deformity  SKIN: Warm and dry NEUROLOGIC:  Alert and oriented x 3 PSYCHIATRIC:  Normal affect    Signed, Shirlee More, MD  01/18/2019 11:13 AM    [facility]

## 2019-01-18 ENCOUNTER — Encounter: Payer: Self-pay | Admitting: Cardiology

## 2019-01-18 ENCOUNTER — Other Ambulatory Visit: Payer: Self-pay

## 2019-01-18 ENCOUNTER — Ambulatory Visit: Payer: Medicare Other | Admitting: Cardiology

## 2019-01-18 VITALS — BP 140/74 | Ht 65.0 in | Wt 253.2 lb

## 2019-01-18 DIAGNOSIS — I5032 Chronic diastolic (congestive) heart failure: Secondary | ICD-10-CM

## 2019-01-18 DIAGNOSIS — I350 Nonrheumatic aortic (valve) stenosis: Secondary | ICD-10-CM

## 2019-01-18 DIAGNOSIS — Z01812 Encounter for preprocedural laboratory examination: Secondary | ICD-10-CM

## 2019-01-18 NOTE — Patient Instructions (Addendum)
Medication Instructions:  Your physician recommends that you continue on your current medications as directed. Please refer to the Current Medication list given to you today.  *If you need a refill on your cardiac medications before your next appointment, please call your pharmacy*  Lab Work: None  Your physician recommends that you return for lab work on Friday, 02/22/2019: BMP, CBC, ProBNP. No appointment needed. No need to fast beforehand.   If you have labs (blood work) drawn today and your tests are completely normal, you will receive your results only by: Marland Kitchen MyChart Message (if you have MyChart) OR . A paper copy in the mail If you have any lab test that is abnormal or we need to change your treatment, we will call you to review the results.  Testing/Procedures:  You will go for pre-procedural COVID testing on Monday, 02/25/2019, at 10:30 am. Please arrive at the [facility] 15 minutes early located at [place], [place]. Go to the building overhang. DO NOT go to the tent or the tent line.   [place] [address] Dept: [phone] Loc: [phone]  Leza Ashdown  01/18/2019  You are scheduled for a Cardiac Catheterization on Wednesday, December 16 with Dr. Sherren Mocha.  1. Please arrive at the [facility] (Main Entrance A) at [facility]: [address] [place],  [address] at 8:00 AM (This time is two hours before your procedure to ensure your preparation). Free valet parking service is available.   Special note: Every effort is made to have your procedure done on time. Please understand that emergencies sometimes delay scheduled procedures.  2. Diet: Do not eat solid foods after midnight.  The patient may have clear liquids until 5am upon the day of the procedure.  3. Labs: You will need to have blood drawn on Friday,  December 11 at [employer]: [address] Technical sales engineer . You do not need to be fasting.  4. Medication instructions in preparation for your procedure:   Contrast Allergy: No  Stop taking, Lasix (Furosemide)  Tuesday, December 15,    On the morning of your procedure, take your Aspirin and any morning medicines NOT listed above.  You may use sips of water.  5. Plan for one night stay--bring personal belongings. 6. Bring a current list of your medications and current insurance cards. 7. You MUST have a responsible person to drive you home. 8. Someone MUST be with you the first 24 hours after you arrive home or your discharge will be delayed. 9. Please wear clothes that are easy to get on and off and wear slip-on shoes.  Thank you for allowing [place] to care for you!   -- [facility]  Follow-Up: At [facility], you and your health needs are our priority.  As part of our continuing mission to provide you with exceptional heart care, we have created designated Provider Care Teams.  These Care Teams include your primary Cardiologist (physician) and Advanced Practice Providers (APPs -  Physician Assistants and Nurse Practitioners) who all work together to provide you with the care you need, when you need it.  Your next appointment:   2 months  The format for your next appointment:   In Person  Provider:   Shirlee More, MD

## 2019-02-11 DIAGNOSIS — E782 Mixed hyperlipidemia: Secondary | ICD-10-CM | POA: Diagnosis not present

## 2019-02-11 DIAGNOSIS — I5030 Unspecified diastolic (congestive) heart failure: Secondary | ICD-10-CM | POA: Diagnosis not present

## 2019-02-14 DIAGNOSIS — M25561 Pain in right knee: Secondary | ICD-10-CM | POA: Diagnosis not present

## 2019-02-14 DIAGNOSIS — M25562 Pain in left knee: Secondary | ICD-10-CM | POA: Diagnosis not present

## 2019-02-14 DIAGNOSIS — M79672 Pain in left foot: Secondary | ICD-10-CM | POA: Diagnosis not present

## 2019-02-14 DIAGNOSIS — M79671 Pain in right foot: Secondary | ICD-10-CM | POA: Diagnosis not present

## 2019-02-14 DIAGNOSIS — G8929 Other chronic pain: Secondary | ICD-10-CM | POA: Diagnosis not present

## 2019-02-21 NOTE — H&P (View-Only) (Signed)
Cardiology Office Note:    Date:  02/22/2019   ID:  Elizabeth [name], Elizabeth [name] Jul 31, 1945, MRN [id number]  PCP:  Marco Collie, MD  Cardiologist:  Shirlee More, MD    Referring MD: Marco Collie, MD    ASSESSMENT:    1. Aortic valve stenosis, etiology of cardiac valve disease unspecified   2. Chronic diastolic heart failure (HCC)    PLAN:    In order of problems listed above:  1. She has severe symptomatic aortic stenosis for left and right heart catheterization Dr. Burt Knack next week and then decision making regarding the advisability of surgical AVR or TAVR.  She understands that she may require surgery especially she needed coincident CABG.  Fortunately she is having no angina and I do not think she will require PCI.  Her heart failure is compensated.  Renal function normal.  We will recheck labs today prior to procedure 2. Stable compensated continue her current diuretic and ACE inhibitor along with beta-blocker calcium channel blocker.   Next appointment: Scheduled with me in February.   Medication Adjustments/Labs and Tests Ordered: Current medicines are reviewed at length with the patient today.  Concerns regarding medicines are outlined above.  No orders of the defined types were placed in this encounter.  No orders of the defined types were placed in this encounter.   Chief Complaint  Patient presents with  . Follow-up    History of Present Illness:    Elizabeth [name] is a 73 y.o. female with a hx of aortic stenosis and heart failure last seen 01/18/2019. Compliance with diet, lifestyle and medications: Yes She is apprehensive but accepting of the need for left and right heart catheterization make a decision about advisability of surgical aortic valve replacement or TAVR.  Her heart failure is compensated she has no edema shortness of breath orthopnea she has had no chest pain or syncope.  She is aware of the risk and is going to have her labs done today and her  COVID-19 testing.  Her EKG in my office today shows sinus rhythm and is normal. She is scheduled for left and right heart catheterization 02/27/2019.  Has had no Covid exposure no respiratory symptoms fever chills I reviewed the results of the echocardiogram during the visit Echo 12/31/2018:  1. Left ventricular ejection fraction, by visual estimation, is 55 to 60%. The left ventricle has normal function. Normal left ventricular size. Left ventricular septal wall thickness was mildly increased. Mildly increased left ventricular posterior  wall thickness. There is mildly increased left ventricular hypertrophy.  2. Elevated mean left atrial pressure.  3. Left ventricular diastolic Doppler parameters are consistent with pseudonormalization pattern of LV diastolic filling.  4. Global right ventricle has normal systolic function.The right ventricular size is normal. No increase in right ventricular wall thickness.  5. Left atrial size was normal.  6. Right atrial size was normal.  7. Mild to moderate mitral annular calcification.  8. The mitral valve is normal in structure. No evidence of mitral valve regurgitation. No evidence of mitral stenosis.  9. The tricuspid valve is normal in structure. Tricuspid valve regurgitation is mild. 10. The aortic valve is bicuspid Aortic valve regurgitation is mild by color flow Doppler. Severe aortic valve stenosis. 11. There is Moderate calcification of the aortic valve. 12. There is Severely thickening of the aortic valve. 13. The pulmonic valve was normal in structure. Pulmonic valve regurgitation is not visualized by color flow Doppler. 14. There is mild to moderate dilatation  of the ascending aorta measuring 39 mm. 15. Normal pulmonary artery systolic pressure. 16. The inferior vena cava is normal in size with greater than 50% respiratory variability, suggesting right atrial pressure of 3 mmHg. Past Medical History:  Diagnosis Date  . Anxiety    extreme  .  Aortic valve disorder   . Benign hypertension with CKD (chronic kidney disease), stage II   . Claustrophobia   . Edema   . GERD (gastroesophageal reflux disease)   . Heart murmur   . Hyperuricemia   . Lumbar stenosis   . Osteoarthrosis   . Vitamin D deficiency     Past Surgical History:  Procedure Laterality Date  . ABDOMINAL HYSTERECTOMY    . APPENDECTOMY    . GALLBLADDER SURGERY    . LUMBAR LAMINECTOMY/DECOMPRESSION MICRODISCECTOMY N/A 06/12/2017   Procedure: LAMINECTOMY AND FORAMINOTOMY LUMBAR THREE- LUMBAR FOUR, LUMBAR FOUR- LUMBAR FIVE;  Surgeon: Newman Pies, MD;  Location: [place];  Service: Neurosurgery;  Laterality: N/A;    Current Medications: Current Meds  Medication Sig  . amLODipine (NORVASC) 5 MG tablet Take 1 tablet (5 mg total) by mouth daily.  . carvedilol (COREG) 6.25 MG tablet Take 6.25 mg by mouth 2 (two) times daily with a meal.   . furosemide (LASIX) 20 MG tablet Take 1 tablet (20 mg total) by mouth daily.  Marland Kitchen lisinopril (ZESTRIL) 10 MG tablet Take 10 mg by mouth daily.   Marland Kitchen omeprazole (PRILOSEC) 40 MG capsule Take 40 mg by mouth daily as needed (acid reflux).   . tamoxifen (NOLVADEX) 20 MG tablet Take 1 tablet (20 mg total) by mouth daily.     Allergies:   Aspirin, Codeine, Lipitor [atorvastatin], Penicillins, and Prednisone   Social History   Socioeconomic History  . Marital status: Married    Spouse name: Not on file  . Number of children: 2  . Years of education: 67  . Highest education level: Not on file  Occupational History  . Occupation: child care  Tobacco Use  . Smoking status: Never Smoker  . Smokeless tobacco: Never Used  Substance and Sexual Activity  . Alcohol use: No  . Drug use: No  . Sexual activity: Not on file  Other Topics Concern  . Not on file  Social History Narrative   Lives with husband in a one story home.  Has 2 children.  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  Education: high school.     Social Determinants of Health    Financial Resource Strain:   . Difficulty of Paying Living Expenses: Not on file  Food Insecurity:   . Worried About Charity fundraiser in the Last Year: Not on file  . Ran Out of Food in the Last Year: Not on file  Transportation Needs:   . Lack of Transportation (Medical): Not on file  . Lack of Transportation (Non-Medical): Not on file  Physical Activity:   . Days of Exercise per Week: Not on file  . Minutes of Exercise per Session: Not on file  Stress:   . Feeling of Stress : Not on file  Social Connections:   . Frequency of Communication with Friends and Family: Not on file  . Frequency of Social Gatherings with Friends and Family: Not on file  . Attends Religious Services: Not on file  . Active Member of Clubs or Organizations: Not on file  . Attends Archivist Meetings: Not on file  . Marital Status: Not on file  Family History: The patient's family history includes Alzheimer's disease in her mother; Aneurysm in her father; Stomach cancer in her brother. ROS:   Please see the history of present illness.    All other systems reviewed and are negative.  EKGs/Labs/Other Studies Reviewed:    The following studies were reviewed today:  EKG:  EKG ordered today and personally reviewed.  The ekg ordered today demonstrates sinus rhythm normal EKG  Recent Labs: 12/20/2018: BUN 21; Creatinine, Ser 1.11; NT-Pro BNP 613; Potassium 4.9; Sodium 145  Recent Lipid Panel No results found for: CHOL, TRIG, HDL, CHOLHDL, VLDL, LDLCALC, LDLDIRECT  Physical Exam:    VS:  BP 122/62 (BP Location: Right Arm)   Pulse 61   Ht 5\' 5"  (1.651 m)   Wt 251 lb 12.8 oz (114.2 kg)   SpO2 98%   BMI 41.90 kg/m     Wt Readings from Last 3 Encounters:  02/22/19 251 lb 12.8 oz (114.2 kg)  01/18/19 253 lb 3.2 oz (114.9 kg)  12/20/18 256 lb (116.1 kg)     GEN:  Well nourished, well developed in no acute distress HEENT: Normal NECK: No JVD; No carotid bruits LYMPHATICS: No  lymphadenopathy CARDIAC: 3/6 to 4/6 holosystolic murmur aortic stenosis encompassing S2 RRR, no murmurs, rubs, gallops RESPIRATORY:  Clear to auscultation without rales, wheezing or rhonchi  ABDOMEN: Soft, non-tender, non-distended MUSCULOSKELETAL:  No edema; No deformity  SKIN: Warm and dry NEUROLOGIC:  Alert and oriented x 3 PSYCHIATRIC:  Normal affect    Signed, Shirlee More, MD  02/22/2019 10:38 AM    [place]

## 2019-02-21 NOTE — Progress Notes (Signed)
Cardiology Office Note:    Date:  02/22/2019   ID:  Elizabeth, [name] 06/29/1945, MRN [id number]  PCP:  Marco Collie, MD  Cardiologist:  Shirlee More, MD    Referring MD: Marco Collie, MD    ASSESSMENT:    1. Aortic valve stenosis, etiology of cardiac valve disease unspecified   2. Chronic diastolic heart failure (HCC)    PLAN:    In order of problems listed above:  1. She has severe symptomatic aortic stenosis for left and right heart catheterization Dr. Burt Knack next week and then decision making regarding the advisability of surgical AVR or TAVR.  She understands that she may require surgery especially she needed coincident CABG.  Fortunately she is having no angina and I do not think she will require PCI.  Her heart failure is compensated.  Renal function normal.  We will recheck labs today prior to procedure 2. Stable compensated continue her current diuretic and ACE inhibitor along with beta-blocker calcium channel blocker.   Next appointment: Scheduled with me in February.   Medication Adjustments/Labs and Tests Ordered: Current medicines are reviewed at length with the patient today.  Concerns regarding medicines are outlined above.  No orders of the defined types were placed in this encounter.  No orders of the defined types were placed in this encounter.   Chief Complaint  Patient presents with  . Follow-up    History of Present Illness:    Elizabeth [name] is a 73 y.o. female with a hx of aortic stenosis and heart failure last seen 01/18/2019. Compliance with diet, lifestyle and medications: Yes She is apprehensive but accepting of the need for left and right heart catheterization make a decision about advisability of surgical aortic valve replacement or TAVR.  Her heart failure is compensated she has no edema shortness of breath orthopnea she has had no chest pain or syncope.  She is aware of the risk and is going to have her labs done today and her  COVID-19 testing.  Her EKG in my office today shows sinus rhythm and is normal. She is scheduled for left and right heart catheterization 02/27/2019.  Has had no Covid exposure no respiratory symptoms fever chills I reviewed the results of the echocardiogram during the visit Echo 12/31/2018:  1. Left ventricular ejection fraction, by visual estimation, is 55 to 60%. The left ventricle has normal function. Normal left ventricular size. Left ventricular septal wall thickness was mildly increased. Mildly increased left ventricular posterior  wall thickness. There is mildly increased left ventricular hypertrophy.  2. Elevated mean left atrial pressure.  3. Left ventricular diastolic Doppler parameters are consistent with pseudonormalization pattern of LV diastolic filling.  4. Global right ventricle has normal systolic function.The right ventricular size is normal. No increase in right ventricular wall thickness.  5. Left atrial size was normal.  6. Right atrial size was normal.  7. Mild to moderate mitral annular calcification.  8. The mitral valve is normal in structure. No evidence of mitral valve regurgitation. No evidence of mitral stenosis.  9. The tricuspid valve is normal in structure. Tricuspid valve regurgitation is mild. 10. The aortic valve is bicuspid Aortic valve regurgitation is mild by color flow Doppler. Severe aortic valve stenosis. 11. There is Moderate calcification of the aortic valve. 12. There is Severely thickening of the aortic valve. 13. The pulmonic valve was normal in structure. Pulmonic valve regurgitation is not visualized by color flow Doppler. 14. There is mild to moderate dilatation  of the ascending aorta measuring 39 mm. 15. Normal pulmonary artery systolic pressure. 16. The inferior vena cava is normal in size with greater than 50% respiratory variability, suggesting right atrial pressure of 3 mmHg. Past Medical History:  Diagnosis Date  . Anxiety    extreme  .  Aortic valve disorder   . Benign hypertension with CKD (chronic kidney disease), stage II   . Claustrophobia   . Edema   . GERD (gastroesophageal reflux disease)   . Heart murmur   . Hyperuricemia   . Lumbar stenosis   . Osteoarthrosis   . Vitamin D deficiency     Past Surgical History:  Procedure Laterality Date  . ABDOMINAL HYSTERECTOMY    . APPENDECTOMY    . GALLBLADDER SURGERY    . LUMBAR LAMINECTOMY/DECOMPRESSION MICRODISCECTOMY N/A 06/12/2017   Procedure: LAMINECTOMY AND FORAMINOTOMY LUMBAR THREE- LUMBAR FOUR, LUMBAR FOUR- LUMBAR FIVE;  Surgeon: Newman Pies, MD;  Location: [facility];  Service: Neurosurgery;  Laterality: N/A;    Current Medications: Current Meds  Medication Sig  . amLODipine (NORVASC) 5 MG tablet Take 1 tablet (5 mg total) by mouth daily.  . carvedilol (COREG) 6.25 MG tablet Take 6.25 mg by mouth 2 (two) times daily with a meal.   . furosemide (LASIX) 20 MG tablet Take 1 tablet (20 mg total) by mouth daily.  Marland Kitchen lisinopril (ZESTRIL) 10 MG tablet Take 10 mg by mouth daily.   Marland Kitchen omeprazole (PRILOSEC) 40 MG capsule Take 40 mg by mouth daily as needed (acid reflux).   . tamoxifen (NOLVADEX) 20 MG tablet Take 1 tablet (20 mg total) by mouth daily.     Allergies:   Aspirin, Codeine, Lipitor [atorvastatin], Penicillins, and Prednisone   Social History   Socioeconomic History  . Marital status: Married    Spouse name: Not on file  . Number of children: 2  . Years of education: 46  . Highest education level: Not on file  Occupational History  . Occupation: child care  Tobacco Use  . Smoking status: Never Smoker  . Smokeless tobacco: Never Used  Substance and Sexual Activity  . Alcohol use: No  . Drug use: No  . Sexual activity: Not on file  Other Topics Concern  . Not on file  Social History Narrative   Lives with husband in a one story home.  Has 2 children.  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  Education: high school.     Social Determinants of Health    Financial Resource Strain:   . Difficulty of Paying Living Expenses: Not on file  Food Insecurity:   . Worried About Charity fundraiser in the Last Year: Not on file  . Ran Out of Food in the Last Year: Not on file  Transportation Needs:   . Lack of Transportation (Medical): Not on file  . Lack of Transportation (Non-Medical): Not on file  Physical Activity:   . Days of Exercise per Week: Not on file  . Minutes of Exercise per Session: Not on file  Stress:   . Feeling of Stress : Not on file  Social Connections:   . Frequency of Communication with Friends and Family: Not on file  . Frequency of Social Gatherings with Friends and Family: Not on file  . Attends Religious Services: Not on file  . Active Member of Clubs or Organizations: Not on file  . Attends Archivist Meetings: Not on file  . Marital Status: Not on file  Family History: The patient's family history includes Alzheimer's disease in her mother; Aneurysm in her father; Stomach cancer in her brother. ROS:   Please see the history of present illness.    All other systems reviewed and are negative.  EKGs/Labs/Other Studies Reviewed:    The following studies were reviewed today:  EKG:  EKG ordered today and personally reviewed.  The ekg ordered today demonstrates sinus rhythm normal EKG  Recent Labs: 12/20/2018: BUN 21; Creatinine, Ser 1.11; NT-Pro BNP 613; Potassium 4.9; Sodium 145  Recent Lipid Panel No results found for: CHOL, TRIG, HDL, CHOLHDL, VLDL, LDLCALC, LDLDIRECT  Physical Exam:    VS:  BP 122/62 (BP Location: Right Arm)   Pulse 61   Ht 5\' 5"  (1.651 m)   Wt 251 lb 12.8 oz (114.2 kg)   SpO2 98%   BMI 41.90 kg/m     Wt Readings from Last 3 Encounters:  02/22/19 251 lb 12.8 oz (114.2 kg)  01/18/19 253 lb 3.2 oz (114.9 kg)  12/20/18 256 lb (116.1 kg)     GEN:  Well nourished, well developed in no acute distress HEENT: Normal NECK: No JVD; No carotid bruits LYMPHATICS: No  lymphadenopathy CARDIAC: 3/6 to 4/6 holosystolic murmur aortic stenosis encompassing S2 RRR, no murmurs, rubs, gallops RESPIRATORY:  Clear to auscultation without rales, wheezing or rhonchi  ABDOMEN: Soft, non-tender, non-distended MUSCULOSKELETAL:  No edema; No deformity  SKIN: Warm and dry NEUROLOGIC:  Alert and oriented x 3 PSYCHIATRIC:  Normal affect    Signed, Shirlee More, MD  02/22/2019 10:38 AM    [place]

## 2019-02-22 ENCOUNTER — Ambulatory Visit (INDEPENDENT_AMBULATORY_CARE_PROVIDER_SITE_OTHER): Payer: Medicare Other | Admitting: Cardiology

## 2019-02-22 ENCOUNTER — Other Ambulatory Visit: Payer: Self-pay

## 2019-02-22 ENCOUNTER — Other Ambulatory Visit (HOSPITAL_COMMUNITY): Payer: Medicare Other

## 2019-02-22 ENCOUNTER — Encounter: Payer: Self-pay | Admitting: Cardiology

## 2019-02-22 VITALS — BP 122/62 | HR 61 | Ht 65.0 in | Wt 251.8 lb

## 2019-02-22 DIAGNOSIS — I5032 Chronic diastolic (congestive) heart failure: Secondary | ICD-10-CM | POA: Diagnosis not present

## 2019-02-22 DIAGNOSIS — I350 Nonrheumatic aortic (valve) stenosis: Secondary | ICD-10-CM

## 2019-02-22 DIAGNOSIS — Z01818 Encounter for other preprocedural examination: Secondary | ICD-10-CM | POA: Diagnosis not present

## 2019-02-22 DIAGNOSIS — Z01812 Encounter for preprocedural laboratory examination: Secondary | ICD-10-CM | POA: Diagnosis not present

## 2019-02-22 NOTE — Patient Instructions (Addendum)
Medication Instructions:  Your physician recommends that you continue on your current medications as directed. Please refer to the Current Medication list given to you today.  *If you need a refill on your cardiac medications before your next appointment, please call your pharmacy*  Lab Work: Your physician recommends that you return for lab work today: CBC, BMP, ProBNP.   If you have labs (blood work) drawn today and your tests are completely normal, you will receive your results only by: Marland Kitchen MyChart Message (if you have MyChart) OR . A paper copy in the mail If you have any lab test that is abnormal or we need to change your treatment, we will call you to review the results.  Testing/Procedures: You had an EKG today.   Follow-Up: At [facility], you and your health needs are our priority.  As part of our continuing mission to provide you with exceptional heart care, we have created designated Provider Care Teams.  These Care Teams include your primary Cardiologist (physician) and Advanced Practice Providers (APPs -  Physician Assistants and Nurse Practitioners) who all work together to provide you with the care you need, when you need it.  Your next appointment:   2 month(s): as scheduled on 04/29/2019 at 11:00 am in the [place] office   The format for your next appointment:   In Person  Provider:   Shirlee More, MD

## 2019-02-23 LAB — BASIC METABOLIC PANEL
BUN/Creatinine Ratio: 20 (ref 12–28)
BUN: 24 mg/dL (ref 8–27)
CO2: 22 mmol/L (ref 20–29)
Calcium: 9.3 mg/dL (ref 8.7–10.3)
Chloride: 105 mmol/L (ref 96–106)
Creatinine, Ser: 1.19 mg/dL — ABNORMAL HIGH (ref 0.57–1.00)
GFR calc Af Amer: 52 mL/min/{1.73_m2} — ABNORMAL LOW (ref >59)
GFR calc non Af Amer: 45 mL/min/{1.73_m2} — ABNORMAL LOW (ref >59)
Glucose: 90 mg/dL (ref 65–99)
Potassium: 4.6 mmol/L (ref 3.5–5.2)
Sodium: 141 mmol/L (ref 134–144)

## 2019-02-23 LAB — CBC
Hematocrit: 36.2 % (ref 34.0–46.6)
Hemoglobin: 12.3 g/dL (ref 11.1–15.9)
MCH: 30.6 pg (ref 26.6–33.0)
MCHC: 34 g/dL (ref 31.5–35.7)
MCV: 90 fL (ref 79–97)
Platelets: 180 10*3/uL (ref 150–450)
RBC: 4.02 x10E6/uL (ref 3.77–5.28)
RDW: 11.8 % (ref 11.7–15.4)
WBC: 8.3 10*3/uL (ref 3.4–10.8)

## 2019-02-23 LAB — PRO B NATRIURETIC PEPTIDE: NT-Pro BNP: 642 pg/mL — ABNORMAL HIGH (ref 0–301)

## 2019-02-25 ENCOUNTER — Other Ambulatory Visit (HOSPITAL_COMMUNITY): Payer: Medicare Other

## 2019-02-25 ENCOUNTER — Telehealth: Payer: Self-pay | Admitting: Cardiology

## 2019-02-25 NOTE — Telephone Encounter (Signed)
Patient has a CoVid test scheduled for today and a cath scheduled for 12/16 -- she has a terrible stomach bug and needs to reschedule.  Please call patient to discuss

## 2019-02-25 NOTE — Telephone Encounter (Signed)
Patient's cardiac catheterization has been rescheduled to Monday, 03/11/2019, at 9 am at [facility]. Patient informed to be at the hospital at 7 am. Patient will go for pre-procedural COVID testing on Saturday, 03/09/2019, at 9:20 am at the [facility]. Patient will arrive 15 minutes early to this appointment and self-isolate afterwards.   Patient will contact her PCP regarding symptoms she is having now for further recommendations.   Patient informed of lab results and verbalized understanding. No further questions.

## 2019-03-04 ENCOUNTER — Telehealth: Payer: Self-pay | Admitting: *Deleted

## 2019-03-04 DIAGNOSIS — I5032 Chronic diastolic (congestive) heart failure: Secondary | ICD-10-CM

## 2019-03-04 DIAGNOSIS — Z01812 Encounter for preprocedural laboratory examination: Secondary | ICD-10-CM

## 2019-03-04 MED ORDER — AMLODIPINE BESYLATE 5 MG PO TABS: 5.0000 mg | ORAL_TABLET | Freq: Every day | ORAL | 0 refills | Status: DC

## 2019-03-04 MED ORDER — FUROSEMIDE 20 MG PO TABS: 20.0000 mg | ORAL_TABLET | Freq: Every day | ORAL | 0 refills | Status: DC

## 2019-03-04 NOTE — Telephone Encounter (Signed)
Telephone call to patient. Informed she needs to repeat BMp to be within 7 days of LHC. She is coming tomorrow (Tuesday) to have them drawn. Also states needs refills on lasix and amlodipine. Will call those in now.

## 2019-03-05 ENCOUNTER — Other Ambulatory Visit: Payer: Self-pay | Admitting: Cardiology

## 2019-03-05 DIAGNOSIS — Z01812 Encounter for preprocedural laboratory examination: Secondary | ICD-10-CM | POA: Diagnosis not present

## 2019-03-05 DIAGNOSIS — I5032 Chronic diastolic (congestive) heart failure: Secondary | ICD-10-CM | POA: Diagnosis not present

## 2019-03-07 ENCOUNTER — Telehealth: Payer: Self-pay | Admitting: *Deleted

## 2019-03-07 ENCOUNTER — Other Ambulatory Visit (HOSPITAL_COMMUNITY): Payer: Medicare Other

## 2019-03-07 LAB — BASIC METABOLIC PANEL
BUN/Creatinine Ratio: 21 (ref 12–28)
BUN: 22 mg/dL (ref 8–27)
CO2: 18 mmol/L — ABNORMAL LOW (ref 20–29)
Calcium: 9 mg/dL (ref 8.7–10.3)
Chloride: 108 mmol/L — ABNORMAL HIGH (ref 96–106)
Creatinine, Ser: 1.07 mg/dL — ABNORMAL HIGH (ref 0.57–1.00)
GFR calc Af Amer: 60 mL/min/{1.73_m2} (ref >59)
GFR calc non Af Amer: 52 mL/min/{1.73_m2} — ABNORMAL LOW (ref >59)
Glucose: 119 mg/dL — ABNORMAL HIGH (ref 65–99)
Potassium: 4.5 mmol/L (ref 3.5–5.2)
Sodium: 142 mmol/L (ref 134–144)

## 2019-03-07 NOTE — Telephone Encounter (Signed)
Pt contacted pre-catheterization scheduled at [facility] for: Monday March 11, 2019 9 AM Verified arrival time and place: [facility]) at: 7 AM   No solid food after midnight prior to cath, clear liquids until 5 AM day of procedure.  Hold: Lasix-day before and day of procedure-GFR 52 Lisinopril-day before and day of procedure-GFR 52  Except hold medications AM meds can be  taken pre-cath with sip of water including: ASA 81 mg   Confirmed patient has responsible adult to drive home post procedure and observe 24 hours after arriving home: yes  Currently, due to Covid-19 pandemic, only one support person will be allowed with patient. Must be the same support person for that patient's entire stay, will be screened and required to wear a mask. They will be asked to wait in the waiting room for the duration of the patient's stay.  Patients are required to wear a mask when they enter the hospital.     COVID-19 Pre-Screening Questions:  . In the past 7 to 10 days have you had a cough,  shortness of breath, headache, congestion, fever (100 or greater) body aches, chills, sore throat, or sudden loss of taste or sense of smell? no  . Have you been around anyone with known Covid 19? no . Have you been around anyone who is awaiting Covid 19 test results in the past 7 to 10 days? no . Have you been around anyone who has been exposed to Covid 19, or has mentioned symptoms of Covid 19 within the past 7 to 10 days? no   I reviewed procedure/mask/visitor instructions, Covid-19 screening questions with patient, she verbalized understanding, thanked me for call.

## 2019-03-09 ENCOUNTER — Other Ambulatory Visit (HOSPITAL_COMMUNITY)
Admission: RE | Admit: 2019-03-09 | Discharge: 2019-03-09 | Disposition: A | Discharge status: Home / Self Care | Payer: Medicare Other | Source: Ambulatory Visit | Attending: Cardiovascular Disease | Admitting: Cardiovascular Disease

## 2019-03-09 DIAGNOSIS — Z01812 Encounter for preprocedural laboratory examination: Secondary | ICD-10-CM | POA: Insufficient documentation

## 2019-03-09 DIAGNOSIS — Z20828 Contact with and (suspected) exposure to other viral communicable diseases: Secondary | ICD-10-CM | POA: Diagnosis not present

## 2019-03-09 LAB — SARS CORONAVIRUS 2 (TAT 6-24 HRS): SARS Coronavirus 2: NEGATIVE

## 2019-03-11 ENCOUNTER — Encounter (HOSPITAL_COMMUNITY)
Admission: RE | Disposition: A | Discharge status: Home / Self Care | Payer: Medicare Other | Source: Home / Self Care | Attending: Cardiovascular Disease

## 2019-03-11 ENCOUNTER — Other Ambulatory Visit: Payer: Self-pay

## 2019-03-11 ENCOUNTER — Encounter (HOSPITAL_COMMUNITY): Payer: Self-pay | Admitting: Cardiovascular Disease

## 2019-03-11 ENCOUNTER — Ambulatory Visit (HOSPITAL_COMMUNITY)
Admission: RE | Admit: 2019-03-11 | Discharge: 2019-03-11 | Disposition: A | Discharge status: Home / Self Care | Payer: Medicare Other | Attending: Cardiovascular Disease | Admitting: Cardiovascular Disease

## 2019-03-11 ENCOUNTER — Other Ambulatory Visit: Payer: Self-pay | Admitting: Physician Assistant

## 2019-03-11 ENCOUNTER — Encounter: Payer: Self-pay | Admitting: Physician Assistant

## 2019-03-11 DIAGNOSIS — K219 Gastro-esophageal reflux disease without esophagitis: Secondary | ICD-10-CM | POA: Insufficient documentation

## 2019-03-11 DIAGNOSIS — I2510 Atherosclerotic heart disease of native coronary artery without angina pectoris: Secondary | ICD-10-CM | POA: Insufficient documentation

## 2019-03-11 DIAGNOSIS — I5032 Chronic diastolic (congestive) heart failure: Secondary | ICD-10-CM | POA: Insufficient documentation

## 2019-03-11 DIAGNOSIS — N182 Chronic kidney disease, stage 2 (mild): Secondary | ICD-10-CM | POA: Insufficient documentation

## 2019-03-11 DIAGNOSIS — I350 Nonrheumatic aortic (valve) stenosis: Secondary | ICD-10-CM

## 2019-03-11 DIAGNOSIS — Z885 Allergy status to narcotic agent status: Secondary | ICD-10-CM | POA: Diagnosis not present

## 2019-03-11 DIAGNOSIS — M1990 Unspecified osteoarthritis, unspecified site: Secondary | ICD-10-CM | POA: Insufficient documentation

## 2019-03-11 DIAGNOSIS — I352 Nonrheumatic aortic (valve) stenosis with insufficiency: Secondary | ICD-10-CM | POA: Diagnosis not present

## 2019-03-11 DIAGNOSIS — I130 Hypertensive heart and chronic kidney disease with heart failure and stage 1 through stage 4 chronic kidney disease, or unspecified chronic kidney disease: Secondary | ICD-10-CM | POA: Diagnosis not present

## 2019-03-11 DIAGNOSIS — Z880 Allergy status to penicillin: Secondary | ICD-10-CM | POA: Insufficient documentation

## 2019-03-11 DIAGNOSIS — Z886 Allergy status to analgesic agent status: Secondary | ICD-10-CM | POA: Diagnosis not present

## 2019-03-11 DIAGNOSIS — Z888 Allergy status to other drugs, medicaments and biological substances status: Secondary | ICD-10-CM | POA: Diagnosis not present

## 2019-03-11 DIAGNOSIS — Z79899 Other long term (current) drug therapy: Secondary | ICD-10-CM | POA: Insufficient documentation

## 2019-03-11 HISTORY — PX: CARDIAC CATHETERIZATION: SHX172

## 2019-03-11 HISTORY — PX: RIGHT/LEFT HEART CATH AND CORONARY ANGIOGRAPHY: CATH118266

## 2019-03-11 HISTORY — DX: Nonrheumatic aortic (valve) stenosis: I35.0

## 2019-03-11 LAB — POCT I-STAT EG7
Acid-base deficit: 3 mmol/L — ABNORMAL HIGH (ref 0.0–2.0)
Acid-base deficit: 1 mmol/L (ref 0.0–2.0)
Bicarbonate: 22.6 mmol/L (ref 20.0–28.0)
Bicarbonate: 24.4 mmol/L (ref 20.0–28.0)
Calcium, Ion: 1.2 mmol/L (ref 1.15–1.40)
Calcium, Ion: 1.23 mmol/L (ref 1.15–1.40)
HCT: 30 % — ABNORMAL LOW (ref 36.0–46.0)
HCT: 28 % — ABNORMAL LOW (ref 36.0–46.0)
Hemoglobin: 10.2 g/dL — ABNORMAL LOW (ref 12.0–15.0)
Hemoglobin: 9.5 g/dL — ABNORMAL LOW (ref 12.0–15.0)
O2 Saturation: 79 %
O2 Saturation: 79 %
Potassium: 4 mmol/L (ref 3.5–5.1)
Potassium: 4 mmol/L (ref 3.5–5.1)
Sodium: 142 mmol/L (ref 135–145)
Sodium: 139 mmol/L (ref 135–145)
TCO2: 24 mmol/L (ref 22–32)
TCO2: 26 mmol/L (ref 22–32)
pCO2, Ven: 39.6 mmHg — ABNORMAL LOW (ref 44.0–60.0)
pCO2, Ven: 42.2 mmHg — ABNORMAL LOW (ref 44.0–60.0)
pH, Ven: 7.365 (ref 7.250–7.430)
pH, Ven: 7.37 (ref 7.250–7.430)
pO2, Ven: 45 mmHg (ref 32.0–45.0)
pO2, Ven: 45 mmHg (ref 32.0–45.0)

## 2019-03-11 LAB — POCT I-STAT 7, (LYTES, BLD GAS, ICA,H+H)
Acid-base deficit: 2 mmol/L (ref 0.0–2.0)
Bicarbonate: 23.3 mmol/L (ref 20.0–28.0)
Calcium, Ion: 1.25 mmol/L (ref 1.15–1.40)
HCT: 31 % — ABNORMAL LOW (ref 36.0–46.0)
Hemoglobin: 10.5 g/dL — ABNORMAL LOW (ref 12.0–15.0)
O2 Saturation: 99 %
Potassium: 4.1 mmol/L (ref 3.5–5.1)
Sodium: 140 mmol/L (ref 135–145)
TCO2: 24 mmol/L (ref 22–32)
pCO2 arterial: 38.9 mmHg (ref 32.0–48.0)
pH, Arterial: 7.385 (ref 7.350–7.450)
pO2, Arterial: 126 mmHg — ABNORMAL HIGH (ref 83.0–108.0)

## 2019-03-11 SURGERY — RIGHT/LEFT HEART CATH AND CORONARY ANGIOGRAPHY
Anesthesia: LOCAL

## 2019-03-11 MED ORDER — LIDOCAINE HCL (PF) 1 % IJ SOLN
INTRAMUSCULAR | Status: AC
  Filled 2019-03-11: qty 30

## 2019-03-11 MED ORDER — LABETALOL HCL 5 MG/ML IV SOLN: 10.0000 mg | INTRAVENOUS | Status: DC | PRN

## 2019-03-11 MED ORDER — SODIUM CHLORIDE 0.9% FLUSH: 3.0000 mL | INTRAVENOUS | Status: DC | PRN

## 2019-03-11 MED ORDER — MIDAZOLAM HCL 2 MG/2ML IJ SOLN
INTRAMUSCULAR | Status: AC
  Filled 2019-03-11: qty 2

## 2019-03-11 MED ORDER — MIDAZOLAM HCL 2 MG/2ML IJ SOLN
INTRAMUSCULAR | Status: DC | PRN
  Administered 2019-03-11 (×2): 1 mg via INTRAVENOUS
  Administered 2019-03-11: 2 mg via INTRAVENOUS

## 2019-03-11 MED ORDER — VERAPAMIL HCL 2.5 MG/ML IV SOLN
INTRAVENOUS | Status: DC | PRN
  Administered 2019-03-11: 09:00:00 10 mL via INTRA_ARTERIAL

## 2019-03-11 MED ORDER — HEPARIN (PORCINE) IN NACL 1000-0.9 UT/500ML-% IV SOLN
INTRAVENOUS | Status: AC
  Filled 2019-03-11: qty 1000

## 2019-03-11 MED ORDER — FENTANYL CITRATE (PF) 100 MCG/2ML IJ SOLN
INTRAMUSCULAR | Status: DC | PRN
  Administered 2019-03-11: 25 ug via INTRAVENOUS

## 2019-03-11 MED ORDER — HEPARIN (PORCINE) IN NACL 1000-0.9 UT/500ML-% IV SOLN
INTRAVENOUS | Status: DC | PRN
  Administered 2019-03-11 (×2): 500 mL

## 2019-03-11 MED ORDER — SODIUM CHLORIDE 0.9 % IV SOLN: 250.0000 mL | INTRAVENOUS | Status: DC | PRN

## 2019-03-11 MED ORDER — FENTANYL CITRATE (PF) 100 MCG/2ML IJ SOLN
INTRAMUSCULAR | Status: DC | PRN
  Administered 2019-03-11 (×3): 25 ug via INTRAVENOUS

## 2019-03-11 MED ORDER — SODIUM CHLORIDE 0.9% FLUSH: 3.0000 mL | Freq: Two times a day (BID) | INTRAVENOUS | Status: DC

## 2019-03-11 MED ORDER — VERAPAMIL HCL 2.5 MG/ML IV SOLN
INTRAVENOUS | Status: AC
  Filled 2019-03-11: qty 2

## 2019-03-11 MED ORDER — FENTANYL CITRATE (PF) 100 MCG/2ML IJ SOLN
INTRAMUSCULAR | Status: AC
  Filled 2019-03-11: qty 2

## 2019-03-11 MED ORDER — ONDANSETRON HCL 4 MG/2ML IJ SOLN: 4.0000 mg | Freq: Four times a day (QID) | INTRAMUSCULAR | Status: DC | PRN

## 2019-03-11 MED ORDER — SODIUM CHLORIDE 0.9 % WEIGHT BASED INFUSION: 1.0000 mL/kg/h | INTRAVENOUS | Status: DC

## 2019-03-11 MED ORDER — DIAZEPAM 5 MG PO TABS: 5.0000 mg | ORAL_TABLET | Freq: Four times a day (QID) | ORAL | Status: DC | PRN

## 2019-03-11 MED ORDER — HYDRALAZINE HCL 20 MG/ML IJ SOLN: 10.0000 mg | INTRAMUSCULAR | Status: DC | PRN

## 2019-03-11 MED ORDER — ACETAMINOPHEN 325 MG PO TABS: 650.0000 mg | ORAL_TABLET | ORAL | Status: DC | PRN

## 2019-03-11 MED ORDER — IOHEXOL 350 MG/ML SOLN
INTRAVENOUS | Status: DC | PRN
  Administered 2019-03-11: 10:00:00 95 mL

## 2019-03-11 MED ORDER — HEPARIN SODIUM (PORCINE) 1000 UNIT/ML IJ SOLN
INTRAMUSCULAR | Status: DC | PRN
  Administered 2019-03-11: 5000 [IU] via INTRAVENOUS

## 2019-03-11 MED ORDER — LIDOCAINE HCL (PF) 1 % IJ SOLN
INTRAMUSCULAR | Status: DC | PRN
  Administered 2019-03-11 (×2): 2 mL

## 2019-03-11 MED ORDER — SODIUM CHLORIDE 0.9 % IV SOLN: INTRAVENOUS | Status: DC

## 2019-03-11 MED ORDER — FENTANYL CITRATE (PF) 100 MCG/2ML IJ SOLN: 25.0000 ug | INTRAMUSCULAR | Status: DC | PRN

## 2019-03-11 MED ORDER — HEPARIN SODIUM (PORCINE) 1000 UNIT/ML IJ SOLN
INTRAMUSCULAR | Status: AC
  Filled 2019-03-11: qty 1

## 2019-03-11 SURGICAL SUPPLY — 15 items
CATH 5FR JL3.5 JR4 ANG PIG MP (CATHETERS) ×1 IMPLANT
CATH BALLN WEDGE 5F 110CM (CATHETERS) ×1 IMPLANT
CATH INFINITI 5 FR 3DRC (CATHETERS) ×1 IMPLANT
DEVICE RAD COMP TR BAND LRG (VASCULAR PRODUCTS) ×1 IMPLANT
GLIDESHEATH SLEND SS 6F .021 (SHEATH) ×1 IMPLANT
GUIDEWIRE INQWIRE 1.5J.035X260 (WIRE) IMPLANT
INQWIRE 1.5J .035X260CM (WIRE) ×2
KIT HEART LEFT (KITS) ×2 IMPLANT
PACK CARDIAC CATHETERIZATION (CUSTOM PROCEDURE TRAY) ×2 IMPLANT
SHEATH GLIDE SLENDER 4/5FR (SHEATH) ×1 IMPLANT
SHEATH PROBE COVER 6X72 (BAG) ×1 IMPLANT
SYR MEDRAD MARK 7 150ML (SYRINGE) ×2 IMPLANT
TRANSDUCER W/STOPCOCK (MISCELLANEOUS) ×2 IMPLANT
TUBING CIL FLEX 10 FLL-RA (TUBING) ×2 IMPLANT
WIRE HI TORQ VERSACORE-J 145CM (WIRE) ×1 IMPLANT

## 2019-03-11 NOTE — Discharge Instructions (Signed)
Radial Site Care ° °This sheet gives you information about how to care for yourself after your procedure. Your health care provider may also give you more specific instructions. If you have problems or questions, contact your health care provider. °What can I expect after the procedure? °After the procedure, it is common to have: °· Bruising and tenderness at the catheter insertion area. °Follow these instructions at home: °Medicines °· Take over-the-counter and prescription medicines only as told by your health care provider. °Insertion site care °· Follow instructions from your health care provider about how to take care of your insertion site. Make sure you: °? Wash your hands with soap and water before you change your bandage (dressing). If soap and water are not available, use hand sanitizer. °? Change your dressing as told by your health care provider. °? Leave stitches (sutures), skin glue, or adhesive strips in place. These skin closures may need to stay in place for 2 weeks or longer. If adhesive strip edges start to loosen and curl up, you may trim the loose edges. Do not remove adhesive strips completely unless your health care provider tells you to do that. °· Check your insertion site every day for signs of infection. Check for: °? Redness, swelling, or pain. °? Fluid or blood. °? Pus or a bad smell. °? Warmth. °· Do not take baths, swim, or use a hot tub until your health care provider approves. °· You may shower 24-48 hours after the procedure, or as directed by your health care provider. °? Remove the dressing and gently wash the site with plain soap and water. °? Pat the area dry with a clean towel. °? Do not rub the site. That could cause bleeding. °· Do not apply powder or lotion to the site. °Activity ° °· For 24 hours after the procedure, or as directed by your health care provider: °? Do not flex or bend the affected arm. °? Do not push or pull heavy objects with the affected arm. °? Do not  drive yourself home from the hospital or clinic. You may drive 24 hours after the procedure unless your health care provider tells you not to. °? Do not operate machinery or power tools. °· Do not lift anything that is heavier than 10 lb (4.5 kg), or the limit that you are told, until your health care provider says that it is safe. °· Ask your health care provider when it is okay to: °? Return to work or school. °? Resume usual physical activities or sports. °? Resume sexual activity. °General instructions °· If the catheter site starts to bleed, raise your arm and put firm pressure on the site. If the bleeding does not stop, get help right away. This is a medical emergency. °· If you went home on the same day as your procedure, a responsible adult should be with you for the first 24 hours after you arrive home. °· Keep all follow-up visits as told by your health care provider. This is important. °Contact a health care provider if: °· You have a fever. °· You have redness, swelling, or yellow drainage around your insertion site. °Get help right away if: °· You have unusual pain at the radial site. °· The catheter insertion area swells very fast. °· The insertion area is bleeding, and the bleeding does not stop when you hold steady pressure on the area. °· Your arm or hand becomes pale, cool, tingly, or numb. °These symptoms may represent a serious problem   that is an emergency. Do not wait to see if the symptoms will go away. Get medical help right away. Call your local emergency services (911 in the [place] Do not drive yourself to the hospital. °Summary °· After the procedure, it is common to have bruising and tenderness at the site. °· Follow instructions from your health care provider about how to take care of your radial site wound. Check the wound every day for signs of infection. °· Do not lift anything that is heavier than 10 lb (4.5 kg), or the limit that you are told, until your health care provider says  that it is safe. °This information is not intended to replace advice given to you by your health care provider. Make sure you discuss any questions you have with your health care provider. °Document Released: 04/02/2010 Document Revised: 04/05/2017 Document Reviewed: 04/05/2017 °Elsevier Patient Education © 2020 [employer] ° °

## 2019-03-11 NOTE — Progress Notes (Signed)
Manual pressure held above TR band  For 10 mins no change in site.  Area continue to be soft to touch and pt denies any discomfort at site.  Pt to Procedural short stay via stretcher.

## 2019-03-11 NOTE — Research (Signed)
PHDE Informed Consent   Subject Name: Elizabeth [name]  Subject met inclusion and exclusion criteria.  The informed consent form, study requirements and expectations were reviewed with the subject and questions and concerns were addressed prior to the signing of the consent form.  The subject verbalized understanding of the trail requirements.  The subject agreed to participate in the PHDE trial and signed the informed consent.  The informed consent was obtained prior to performance of any protocol-specific procedures for the subject.  A copy of the signed informed consent was given to the subject and a copy was placed in the subject's medical record.  [name] 03/11/2019, 8:27 AM

## 2019-03-11 NOTE — Interval H&P Note (Signed)
History and Physical Interval Note:  03/11/2019 8:34 AM  Elizabeth [name]  has presented today for surgery, with the diagnosis of Aortic stenosis.  The various methods of treatment have been discussed with the patient and family. After consideration of risks, benefits and other options for treatment, the patient has consented to  Procedure(s): RIGHT/LEFT HEART CATH AND CORONARY ANGIOGRAPHY (N/A) as a surgical intervention.  The patient's history has been reviewed, patient examined, no change in status, stable for surgery.  I have reviewed the patient's chart and labs.  Questions were answered to the patient's satisfaction.     Sherren Mocha

## 2019-03-13 DIAGNOSIS — M159 Polyosteoarthritis, unspecified: Secondary | ICD-10-CM | POA: Diagnosis not present

## 2019-03-13 DIAGNOSIS — I5030 Unspecified diastolic (congestive) heart failure: Secondary | ICD-10-CM | POA: Diagnosis not present

## 2019-03-13 DIAGNOSIS — E782 Mixed hyperlipidemia: Secondary | ICD-10-CM | POA: Diagnosis not present

## 2019-03-15 ENCOUNTER — Telehealth: Payer: Self-pay | Admitting: Physician Assistant

## 2019-03-15 NOTE — Telephone Encounter (Signed)
  [place] VALVE TEAM  Patient is undergoing TAVR work-up.  I originally made all of her work-up for mid January with a potential surgery date of 1/19.  However, the patient was adamant that she could not make these appointments because of a previously scheduled noninvasive knee procedure she has on January 14.  She requested that I move her work out to early February.  After moving all of these appointments, Dr. Bettina Gavia and Dr. Burt Knack reviewed her case and feel that she should cancel her knee procedure and not postpone her TAVR work-up.  I called the patient and explained that we are very worried about her severe aortic stenosis and would recommend that she not wait until February.  She is unwilling to cancel her knee procedure as she is in tremendous pain.  However, she is willing to move up her TAVR work-up by a couple weeks.  She prefers Wednesdays.  We will work on moving her appointments up next week.   Angelena Form PA-C  [facility]

## 2019-03-20 NOTE — Telephone Encounter (Signed)
  [facility]   MULTIDISCIPLINARY HEART VALVE TEAM  Attempted to reach the pt at home number but no answer.  I called the pt's mobile number but her voicemail is full and will not allow me to leave a message at this time.

## 2019-03-26 ENCOUNTER — Ambulatory Visit: Payer: Medicare Other | Admitting: Physical Therapy

## 2019-03-26 ENCOUNTER — Encounter (HOSPITAL_COMMUNITY): Payer: Medicare Other

## 2019-03-26 ENCOUNTER — Ambulatory Visit (HOSPITAL_COMMUNITY): Payer: Medicare Other

## 2019-03-27 ENCOUNTER — Encounter: Payer: Medicare Other | Admitting: Surgery

## 2019-03-28 ENCOUNTER — Telehealth: Payer: Self-pay | Admitting: Physician Assistant

## 2019-03-28 DIAGNOSIS — I5030 Unspecified diastolic (congestive) heart failure: Secondary | ICD-10-CM | POA: Diagnosis not present

## 2019-03-28 DIAGNOSIS — E782 Mixed hyperlipidemia: Secondary | ICD-10-CM | POA: Diagnosis not present

## 2019-03-28 DIAGNOSIS — R5383 Other fatigue: Secondary | ICD-10-CM | POA: Diagnosis not present

## 2019-03-28 NOTE — Telephone Encounter (Signed)
  [facility]   MULTIDISCIPLINARY HEART VALVE TEAM  Patiently is currently undergoing work-up for TAVR.  After several back-and-forth discussions about her appointments, we had planned to move her pre-TAVR testing up given very severe aortic stenosis. However, the patient became very sick and was diagnosed with covid-19 along with 11 other family members. The patient states she has never been as sick in her life. She did not think she was going to make it through. She is currently very weak and very fatigued. She has lost over 25 lbs. She saw her PCP today and had blood work. She has asked to cancel all appointments. I have advised her to keep her 2/1 appts and we will check in with her in a week or so to see how she is feeling. Given the severity of her AS, I have recommended that she not cancel any of her pre TAVR testing.    Angelena Form PA-C  [facility]

## 2019-04-04 ENCOUNTER — Telehealth: Payer: Self-pay | Admitting: Physician Assistant

## 2019-04-04 NOTE — Telephone Encounter (Signed)
  [facility]   MULTIDISCIPLINARY HEART VALVE TEAM   Called patient to check in. She is having a very slow recovery from Covid-19. She is working closely with her PCP to try to regain strength. She is adamant that she wants to cancel her pre TAVR testing scheduled for 04/15/19. She continues to have profound weakness and cough. She sees her PCP, Dr. Nyra Capes, this Thursday. We discussed how her severe AS could also be contributing to her symptoms. I have recommended that we keep her appointments on the books in the case that she is feeling a little better in 1 weeks times. I will forward to Dr. Burt Knack and Dr Bettina Gavia to keep them in the loop. I will check back in with her next week.   Angelena Form PA-C  [facility]

## 2019-04-09 ENCOUNTER — Telehealth: Payer: Self-pay | Admitting: Physician Assistant

## 2019-04-09 NOTE — Telephone Encounter (Signed)
  [facility]   MULTIDISCIPLINARY HEART VALVE TEAM   Called to check in with Elizabeth [name]. She is still having a slow recovery from Covid. She is following closely with her PCP and regaining strength by the day. We discussed her TAVR tests coming on 2/1. She is REFUSING to come into her tests saying she is too weak to drive to [place]. We discussed how sometimes severe AS can have similar symptoms and that it would be wise not push off testing much longer. She will not come in for testing any earlier than March 1. I asked her to call us if she develops any worsening SOB, chest pain or syncope. She has an appt with Dr. Bettina Gavia on 2/15 which she plans on attending.  Angelena Form PA-C  [facility]

## 2019-04-09 NOTE — Telephone Encounter (Signed)
Thx Katie ?

## 2019-04-11 DIAGNOSIS — I350 Nonrheumatic aortic (valve) stenosis: Secondary | ICD-10-CM | POA: Diagnosis not present

## 2019-04-11 DIAGNOSIS — M25569 Pain in unspecified knee: Secondary | ICD-10-CM | POA: Diagnosis not present

## 2019-04-11 DIAGNOSIS — R5383 Other fatigue: Secondary | ICD-10-CM | POA: Diagnosis not present

## 2019-04-11 DIAGNOSIS — I5030 Unspecified diastolic (congestive) heart failure: Secondary | ICD-10-CM | POA: Diagnosis not present

## 2019-04-12 DIAGNOSIS — I350 Nonrheumatic aortic (valve) stenosis: Secondary | ICD-10-CM | POA: Diagnosis not present

## 2019-04-12 DIAGNOSIS — I5030 Unspecified diastolic (congestive) heart failure: Secondary | ICD-10-CM | POA: Diagnosis not present

## 2019-04-15 ENCOUNTER — Other Ambulatory Visit (HOSPITAL_COMMUNITY): Payer: Medicare Other

## 2019-04-15 ENCOUNTER — Ambulatory Visit (HOSPITAL_COMMUNITY): Payer: Medicare Other

## 2019-04-15 ENCOUNTER — Encounter (HOSPITAL_COMMUNITY): Payer: Medicare Other

## 2019-04-15 ENCOUNTER — Ambulatory Visit: Payer: Medicare Other | Admitting: Physical Therapy

## 2019-04-17 ENCOUNTER — Encounter: Payer: Medicare Other | Admitting: Surgery

## 2019-04-27 NOTE — Progress Notes (Signed)
Cardiology Office Note:    Date:  04/29/2019   ID:  [name], Polick [birth year], MRN [id number]  PCP:  Marco Collie, MD  Cardiologist:  Shirlee More, MD    Referring MD: Marco Collie, MD    ASSESSMENT:    1. Severe aortic stenosis   2. Chronic diastolic heart failure ([place])   3. Hypertension, renal disease, stage 1-4 or unspecified chronic kidney disease   4.      COVID-19 infection PLAN:    In order of problems listed above:  1. She has symptomatic stage D aortic stenosis is recovered from recent COVID-19 will have renal function proBNP checked and is set up for CT scans in preparation for TAVR.  At her request I described the procedure to her.  Her renal function is improved stable and recheck today 2. Compensated continue her current loop diuretic BP is at target continue treatment calcium channel blocker beta-blocker ACE inhibitor Recovered proceed with TAVR  Next appointment: 3 months after TAVR   Medication Adjustments/Labs and Tests Ordered: Current medicines are reviewed at length with the patient today.  Concerns regarding medicines are outlined above.  No orders of the defined types were placed in this encounter.  No orders of the defined types were placed in this encounter.   Chief Complaint  Patient presents with  . Aortic Stenosis  . Congestive Heart Failure    History of Present Illness:    Elizabeth [name] is a 74 y.o. female with a hx of severe symptomatic aortic stenosis and heart failure last seen 02/22/2019.   I independently reviewed her coronary angiogram and hemodynamics from cardiac cath performed 03/11/2019. She has nonobstructive CAD and severe aortic stenosis with a mean gradient 43 mmHg. Unfortunately she has had COVID-19 and has delayed TAVR.  I received a secure chat today asking me to draw a renal function test in preparation for CTA as part of the TAVR process. Her most recent assessment of renal function 08/03/2018 creatinine  1.07 GFR 52 cc stable chronic CKD stage II Compliance with diet, lifestyle and medications: Yes  Interim she had COVID-19 was quite ill lost in excess of 20 pounds but is recovered and is going to have her CTs done in preparation for TAVR.  Light being very ill with Covid she has had no edema shortness of breath chest pain palpitation or syncope.  In the interim she has had lab work done with her PCP 03/28/2019 cholesterol 166 LDL 107 HDL 45 creatinine 0.97. Past Medical History:  Diagnosis Date  . Anxiety    extreme  . Benign hypertension with CKD (chronic kidney disease), stage II   . Claustrophobia   . GERD (gastroesophageal reflux disease)   . Hyperuricemia   . Lumbar stenosis   . Osteoarthrosis   . Severe aortic stenosis   . Vitamin D deficiency     Past Surgical History:  Procedure Laterality Date  . ABDOMINAL HYSTERECTOMY    . APPENDECTOMY    . GALLBLADDER SURGERY    . LUMBAR LAMINECTOMY/DECOMPRESSION MICRODISCECTOMY N/A 06/12/2017   Procedure: LAMINECTOMY AND FORAMINOTOMY LUMBAR THREE- LUMBAR FOUR, LUMBAR FOUR- LUMBAR FIVE;  Surgeon: Newman Pies, MD;  Location: [facility];  Service: Neurosurgery;  Laterality: N/A;  . RIGHT/LEFT HEART CATH AND CORONARY ANGIOGRAPHY N/A 03/11/2019   Procedure: RIGHT/LEFT HEART CATH AND CORONARY ANGIOGRAPHY;  Surgeon: Sherren Mocha, MD;  Location: [facility];  Service: Cardiovascular;  Laterality: N/A;    Current Medications: No outpatient medications have been  marked as taking for the 04/29/19 encounter (Office Visit) with Richardo Priest, MD.     Allergies:   Codeine, Lipitor [atorvastatin], Penicillins, and Prednisone   Social History   Socioeconomic History  . Marital status: Married    Spouse name: Not on file  . Number of children: 2  . Years of education: 64  . Highest education level: Not on file  Occupational History  . Occupation: child care  Tobacco Use  . Smoking status: Never Smoker  . Smokeless tobacco: Never  Used  Substance and Sexual Activity  . Alcohol use: No  . Drug use: No  . Sexual activity: Not on file  Other Topics Concern  . Not on file  Social History Narrative   Lives with husband in a one story home.  Has 2 children.  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  Education: high school.     Social Determinants of Health   Financial Resource Strain:   . Difficulty of Paying Living Expenses: Not on file  Food Insecurity:   . Worried About Charity fundraiser in the Last Year: Not on file  . Ran Out of Food in the Last Year: Not on file  Transportation Needs:   . Lack of Transportation (Medical): Not on file  . Lack of Transportation (Non-Medical): Not on file  Physical Activity:   . Days of Exercise per Week: Not on file  . Minutes of Exercise per Session: Not on file  Stress:   . Feeling of Stress : Not on file  Social Connections:   . Frequency of Communication with Friends and Family: Not on file  . Frequency of Social Gatherings with Friends and Family: Not on file  . Attends Religious Services: Not on file  . Active Member of Clubs or Organizations: Not on file  . Attends Archivist Meetings: Not on file  . Marital Status: Not on file     Family History: The patient's family history includes Alzheimer's disease in her mother; Aneurysm in her father; Stomach cancer in her brother. ROS:   Please see the history of present illness.    All other systems reviewed and are negative.  EKGs/Labs/Other Studies Reviewed:    The following studies were reviewed today:    Recent Labs: 02/22/2019: NT-Pro BNP 642; Platelets 180 03/05/2019: BUN 22; Creatinine, Ser 1.07 03/11/2019: Hemoglobin 9.5; Potassium 4.0; Sodium 139  Recent Lipid Panel No results found for: CHOL, TRIG, HDL, CHOLHDL, VLDL, LDLCALC, LDLDIRECT  Physical Exam:    VS:  BP 114/82   Pulse 62   Temp (!) 95 F (35 C)   Ht 5\' 5"  (1.651 m)   Wt 242 lb (109.8 kg)   SpO2 98%   BMI 40.27 kg/m     Wt  Readings from Last 3 Encounters:  04/29/19 242 lb (109.8 kg)  03/11/19 247 lb (112 kg)  02/22/19 251 lb 12.8 oz (114.2 kg)     GEN:  Well nourished, well developed in no acute distress HEENT: Normal NECK: No JVD; No carotid bruits LYMPHATICS: No lymphadenopathy CARDIAC: 3 of 6 harsh systolic murmur encompasses S2 radiates to the carotids no AR RRR, no murmurs, rubs, gallops RESPIRATORY:  Clear to auscultation without rales, wheezing or rhonchi  ABDOMEN: Soft, non-tender, non-distended MUSCULOSKELETAL:  No edema; No deformity  SKIN: Warm and dry NEUROLOGIC:  Alert and oriented x 3 PSYCHIATRIC:  Normal affect    Signed, Shirlee More, MD  04/29/2019 11:18 AM  [facility]

## 2019-04-29 ENCOUNTER — Other Ambulatory Visit: Payer: Self-pay

## 2019-04-29 ENCOUNTER — Ambulatory Visit (INDEPENDENT_AMBULATORY_CARE_PROVIDER_SITE_OTHER): Payer: Medicare Other | Admitting: Cardiology

## 2019-04-29 ENCOUNTER — Encounter: Payer: Self-pay | Admitting: Cardiology

## 2019-04-29 VITALS — BP 114/82 | HR 62 | Temp 95.0°F | Ht 65.0 in | Wt 242.0 lb

## 2019-04-29 DIAGNOSIS — I129 Hypertensive chronic kidney disease with stage 1 through stage 4 chronic kidney disease, or unspecified chronic kidney disease: Secondary | ICD-10-CM

## 2019-04-29 DIAGNOSIS — I5032 Chronic diastolic (congestive) heart failure: Secondary | ICD-10-CM

## 2019-04-29 DIAGNOSIS — I350 Nonrheumatic aortic (valve) stenosis: Secondary | ICD-10-CM | POA: Diagnosis not present

## 2019-04-29 NOTE — Patient Instructions (Signed)
Medication Instructions:  Your physician recommends that you continue on your current medications as directed. Please refer to the Current Medication list given to you today.  *If you need a refill on your cardiac medications before your next appointment, please call your pharmacy*  Lab Work: Your physician recommends that you return for lab work in: TODAY BMP,ProBNP  If you have labs (blood work) drawn today and your tests are completely normal, you will receive your results only by: Marland Kitchen MyChart Message (if you have MyChart) OR . A paper copy in the mail If you have any lab test that is abnormal or we need to change your treatment, we will call you to review the results.  Testing/Procedures: None  Follow-Up: At [facility], you and your health needs are our priority.  As part of our continuing mission to provide you with exceptional heart care, we have created designated Provider Care Teams.  These Care Teams include your primary Cardiologist (physician) and Advanced Practice Providers (APPs -  Physician Assistants and Nurse Practitioners) who all work together to provide you with the care you need, when you need it.  Your next appointment:   3 month(s)  The format for your next appointment:   In Person  Provider:   Shirlee More, MD  Other Instructions

## 2019-04-30 LAB — BASIC METABOLIC PANEL
BUN/Creatinine Ratio: 19 (ref 12–28)
BUN: 21 mg/dL (ref 8–27)
CO2: 19 mmol/L — ABNORMAL LOW (ref 20–29)
Calcium: 9.3 mg/dL (ref 8.7–10.3)
Chloride: 110 mmol/L — ABNORMAL HIGH (ref 96–106)
Creatinine, Ser: 1.11 mg/dL — ABNORMAL HIGH (ref 0.57–1.00)
GFR calc Af Amer: 57 mL/min/{1.73_m2} — ABNORMAL LOW (ref >59)
GFR calc non Af Amer: 49 mL/min/{1.73_m2} — ABNORMAL LOW (ref >59)
Glucose: 89 mg/dL (ref 65–99)
Potassium: 4.3 mmol/L (ref 3.5–5.2)
Sodium: 146 mmol/L — ABNORMAL HIGH (ref 134–144)

## 2019-04-30 LAB — PRO B NATRIURETIC PEPTIDE: NT-Pro BNP: 669 pg/mL — ABNORMAL HIGH (ref 0–301)

## 2019-05-01 ENCOUNTER — Other Ambulatory Visit: Payer: Self-pay | Admitting: Hematology and Oncology

## 2019-05-12 DIAGNOSIS — I350 Nonrheumatic aortic (valve) stenosis: Secondary | ICD-10-CM | POA: Diagnosis not present

## 2019-05-12 DIAGNOSIS — I5030 Unspecified diastolic (congestive) heart failure: Secondary | ICD-10-CM | POA: Diagnosis not present

## 2019-05-13 ENCOUNTER — Ambulatory Visit (HOSPITAL_COMMUNITY)
Admission: RE | Admit: 2019-05-13 | Discharge: 2019-05-13 | Disposition: A | Discharge status: Home / Self Care | Payer: Medicare Other | Source: Ambulatory Visit | Attending: Physician Assistant | Admitting: Physician Assistant

## 2019-05-13 ENCOUNTER — Encounter (HOSPITAL_COMMUNITY): Payer: Self-pay

## 2019-05-13 ENCOUNTER — Other Ambulatory Visit: Payer: Self-pay

## 2019-05-13 ENCOUNTER — Ambulatory Visit: Payer: Medicare Other | Attending: Physician Assistant | Admitting: Physical Therapy

## 2019-05-13 ENCOUNTER — Ambulatory Visit (HOSPITAL_COMMUNITY): Admission: RE | Admit: 2019-05-13 | Payer: Medicare Other | Source: Ambulatory Visit

## 2019-05-13 ENCOUNTER — Other Ambulatory Visit (HOSPITAL_COMMUNITY): Payer: Self-pay | Admitting: Cardiology

## 2019-05-13 ENCOUNTER — Encounter: Payer: Self-pay | Admitting: Physical Therapy

## 2019-05-13 DIAGNOSIS — I350 Nonrheumatic aortic (valve) stenosis: Secondary | ICD-10-CM | POA: Diagnosis not present

## 2019-05-13 DIAGNOSIS — R2689 Other abnormalities of gait and mobility: Secondary | ICD-10-CM | POA: Diagnosis not present

## 2019-05-13 NOTE — Therapy (Signed)
[place], [place], [address] Phone: [phone]   Fax:  [phone]  Physical Therapy Evaluation  Patient Details  Name: Elizabeth [name] MRN: [id number] Date of Birth: [birth year] Referring Provider (PT): Eileen Stanford, [place]   Encounter Date: 05/13/2019  PT End of Session - 05/13/19 1207    Visit Number  1    Number of Visits  1    Authorization Type  [employer] MCR    PT Start Time  1210    PT Stop Time  1240    PT Time Calculation (min)  30 min    Equipment Utilized During Treatment  Gait belt    Activity Tolerance  Patient tolerated treatment well    Behavior During Therapy  [facility] for tasks assessed/performed       Past Medical History:  Diagnosis Date  . Anxiety    extreme  . Benign hypertension with CKD (chronic kidney disease), stage II   . Claustrophobia   . GERD (gastroesophageal reflux disease)   . Hyperuricemia   . Lumbar stenosis   . Osteoarthrosis   . Severe aortic stenosis   . Vitamin D deficiency     Past Surgical History:  Procedure Laterality Date  . ABDOMINAL HYSTERECTOMY    . APPENDECTOMY    . GALLBLADDER SURGERY    . LUMBAR LAMINECTOMY/DECOMPRESSION MICRODISCECTOMY N/A 06/12/2017   Procedure: LAMINECTOMY AND FORAMINOTOMY LUMBAR THREE- LUMBAR FOUR, LUMBAR FOUR- LUMBAR FIVE;  Surgeon: Newman Pies, MD;  Location: [place];  Service: Neurosurgery;  Laterality: N/A;  . RIGHT/LEFT HEART CATH AND CORONARY ANGIOGRAPHY N/A 03/11/2019   Procedure: RIGHT/LEFT HEART CATH AND CORONARY ANGIOGRAPHY;  Surgeon: Sherren Mocha, MD;  Location: [facility];  Service: Cardiovascular;  Laterality: N/A;    There were no vitals filed for this visit.   Subjective Assessment - 05/13/19 1214    Subjective  Patient reports in July 2020 she started getting real short of breath, high blood pressure, and she was having trouble breathing which is when she had some heart issues. If she walks  extended periods she will get short of breath, but she denies any chest pain. She notes that she does have left knee pain that also limits her walking and mobility.    Pertinent History  HTN, CHF, CKD, OA (left knee), Anxiety    Limitations  House hold activities;Walking    How long can you sit comfortably?  No limitation    How long can you stand comfortably?  No limitation    How long can you walk comfortably?  2-3 minutes    Patient Stated Goals  Get heart better    Currently in Pain?  Yes    Pain Score  5     Pain Location  Knee    Pain Orientation  Left    Pain Descriptors / Indicators  Aching;Sharp    Pain Type  Chronic pain    Pain Onset  More than a month ago    Pain Frequency  Intermittent    Aggravating Factors   Standing, walking    Pain Relieving Factors  Rest         [facility] PT Assessment - 05/13/19 0001      Assessment   Medical Diagnosis  Severe aortic stenosis    Referring Provider (PT)  Eileen Stanford, PA-C    Onset Date/Surgical Date  --   09/2018   Hand Dominance  Left    Next MD  Visit  05/28/2019    Prior Therapy  None      Precautions   Precautions  None      Restrictions   Weight Bearing Restrictions  No      Balance Screen   Has the patient fallen in the past 6 months  No    Has the patient had a decrease in activity level because of a fear of falling?   No    Is the patient reluctant to leave their home because of a fear of falling?   No      Home Environment   Living Environment  Private residence    Living Arrangements  Spouse/significant other    Type of [place] to enter    Entrance Stairs-Number of Steps  3-7      Prior Function   Level of Independence  Independent    Vocation  Retired      Associate Professor   Overall Cognitive Status  Within Functional Limits for tasks assessed      Observation/Other Assessments   Observations  Patient appears in no apparent distress    Focus on Therapeutic Outcomes (FOTO)    NA      Posture/Postural Control   Posture Comments  Patient exhibits rounded shoulder and forward head posture      ROM / Strength   AROM / PROM / Strength  AROM;Strength      AROM   Overall AROM Comments  AROM grossly WNL throughout UE and LE      Strength   Overall Strength Comments  Strength grossly 5/5 throughout UE and LE    Strength Assessment Site  Hand    Right/Left hand  Right;Left    Right Hand Grip (lbs)  42    Left Hand Grip (lbs)  45      Palpation   Palpation comment  Non-TTP      Transfers   Transfers  Independent with all Transfers      Ambulation/Gait   Ambulation/Gait  Yes    Ambulation/Gait Assistance  7: Independent    Gait Comments  Antalgic on left       [facility] Pre-Surgical Assessment - 05/13/19 0001    5 Meter Walk Test- trial 1  4 sec    5 Meter Walk Test- trial 2  4 sec.     5 Meter Walk Test- trial 3  4 sec.    5 meter walk test average  4 sec    4 Stage Balance Test tolerated for:   10 sec.    4 Stage Balance Test Position  3    Sit To Stand Test- trial 1  10 sec.    ADL/IADL Independent with:  Bathing;Dressing;Meal prep;Finances    ADL/IADL Needs Assistance with:  [employer] work    ADL/IADL Fraility Index  Vulnerable    Other comment  12/12 frailty    6 Minute Walk- Baseline  yes    BP (mmHg)  131/71    HR (bpm)  66    02 Sat (%RA)  98 %    Modified Borg Scale for Dyspnea  0- Nothing at all    Perceived Rate of Exertion (Borg)  6-    6 Minute Walk Post Test  yes    BP (mmHg)  (!) 147/98    HR (bpm)  105    02 Sat (%RA)  89 %    Modified Borg Scale for Dyspnea  7- Severe shortness of breath or very hard breathing    Perceived Rate of Exertion (Borg)  7- Very, very light    Aerobic Endurance Distance Walked  400    Endurance additional comments  Required 2 rest breaks              Objective measurements completed on examination: See above findings.              PT Education - 05/13/19 1206    Education Details   Energy conservation and activity modification    Person(s) Educated  Patient    Methods  Explanation    Comprehension  Verbalized understanding                  Plan - 05/13/19 1207    Clinical Impression Statement  See below.    Personal Factors and Comorbidities  Age 74;Time since onset of injury/illness/exacerbation;Fitness    Examination-Activity Limitations  Locomotion Level    Examination-Participation Restrictions  Community Activity;Shop    Stability/Clinical Decision Making  Stable/Uncomplicated    Clinical Decision Making  Low    Rehab Potential  Excellent    PT Frequency  One time visit    PT Duration  --   One time visit   PT Treatment/Interventions  ADLs/Self Care Home Management;Energy conservation;Gait training    PT Next Visit Plan  NA    PT Home Exercise Plan  NA    Consulted and Agree with Plan of Care  Patient      Clinical Impression Statement: Pt is a 74 yo female presenting to OP PT for evaluation prior to possible TAVR surgery due to severe aortic stenosis. Pt reports onset of shortness of breath approximately 8 months ago. Symptoms are limiting household and community activities and walking. Pt presents with excellent ROM and strength, good balance and is not at high fall risk 4 stage balance test, good walking speed and poor aerobic endurance per 6 minute walk test. Pt ambulated 200 feet in 1:05 before requesting a seated rest beak lasting 2:00. At time of rest, patient's HR was 104 bpm and O2 was 97 on room air. Pt reported 7/10 shortness of breath on modified scale for dyspnea. Pt able to resume after rest and ambulate an additional 200 feet  in 1:05 before requesting a seated rest beak lasting 1:50. At time of rest, patient's HR was 105 bpm and O2 was 89 on room air. Pt reported 7/10 shortness of breath on modified scale for dyspnea. Pt ambulated a total of 400 feet in 6 minute walk. Shortness of breath increased significantly with 6 minute walk test. Based  on the Short Physical Performance Battery, patient has a frailty rating of 12/12 with </= 5/12 considered frail.   Patient demonstrates the following deficits and impairments:  Cardiopulmonary status limiting activity, Decreased endurance, Abnormal gait, Difficulty walking, Decreased activity tolerance  Visit Diagnosis: Other abnormalities of gait and mobility     Problem List Patient Active Problem List   Diagnosis Date Noted  . Aortic stenosis 12/20/2018  . Chronic diastolic heart failure ([place]) 12/20/2018  . Insomnia 09/25/2018  . New onset of congestive heart failure ([place]) 09/25/2018  . Orthopnea 09/25/2018  . Tachycardia 09/25/2018  . Uncontrolled hypertension 09/25/2018  . Lumbar radiculopathy 11/28/2017  . Ductal carcinoma in situ (DCIS) of left breast 11/08/2017  . Leg cramps 11/07/2017  . Chronic pain of both knees 09/12/2017  . Spondylolisthesis of lumbar region 06/12/2017  . Bilateral leg pain 03/17/2017  .  Numbness of right foot 03/17/2017    Hilda Blades, PT, DPT, LAT, ATC 05/13/19  1:06 PM Phone: [phone] Fax: [facility] [address] [place], [place], [address] Phone: [phone]   Fax:  [phone]  Name: Elfa Depass MRN: [id number] Date of Birth: 16-Aug-1945

## 2019-05-13 NOTE — Progress Notes (Signed)
Carotid artery duplex completed. Refer to "CV Proc" under chart review to view preliminary results.  05/13/2019 9:46 AM Kelby Aline., MHA, RVT, RDCS, RDMS

## 2019-05-14 ENCOUNTER — Ambulatory Visit (HOSPITAL_COMMUNITY)
Admission: RE | Admit: 2019-05-14 | Discharge: 2019-05-14 | Disposition: A | Discharge status: Home / Self Care | Payer: Medicare Other | Source: Ambulatory Visit | Attending: Physician Assistant | Admitting: Physician Assistant

## 2019-05-14 ENCOUNTER — Other Ambulatory Visit (HOSPITAL_COMMUNITY): Payer: Self-pay | Admitting: Cardiology

## 2019-05-14 ENCOUNTER — Other Ambulatory Visit (HOSPITAL_COMMUNITY): Payer: Medicare Other

## 2019-05-14 ENCOUNTER — Ambulatory Visit (HOSPITAL_COMMUNITY)
Admission: RE | Admit: 2019-05-14 | Discharge: 2019-05-14 | Disposition: A | Discharge status: Home / Self Care | Payer: Medicare Other | Source: Ambulatory Visit | Attending: Cardiology | Admitting: Cardiology

## 2019-05-14 DIAGNOSIS — Z01818 Encounter for other preprocedural examination: Secondary | ICD-10-CM | POA: Diagnosis not present

## 2019-05-14 DIAGNOSIS — I350 Nonrheumatic aortic (valve) stenosis: Secondary | ICD-10-CM | POA: Diagnosis not present

## 2019-05-14 DIAGNOSIS — I872 Venous insufficiency (chronic) (peripheral): Secondary | ICD-10-CM | POA: Diagnosis not present

## 2019-05-14 HISTORY — PX: IR RADIOLOGY PERIPHERAL GUIDED IV START: IMG5598

## 2019-05-14 HISTORY — PX: IR US GUIDE VASC ACCESS RIGHT: IMG2390

## 2019-05-14 IMAGING — US IR RADIOLOGY PERIPHERAL GUIDED IV START
2 series · 2 of 2 positions shown · non-contrast
Comparison: none

INDICATION: 73-year-old female with a history of poor venous access and need for
contrast-enhanced CT.

[Series 1: ir (id) (id)/(id) · 1 of 1 slices shown]
[im 1/1]
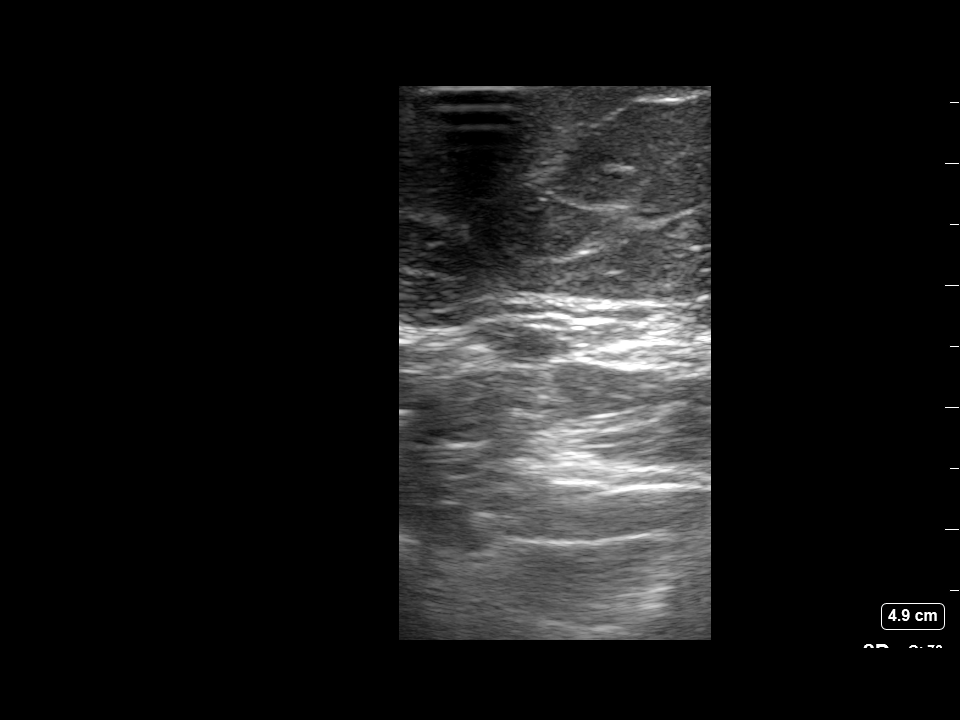

[Series 1: (id) · 1 of 1 slices shown]
[im 1/1]
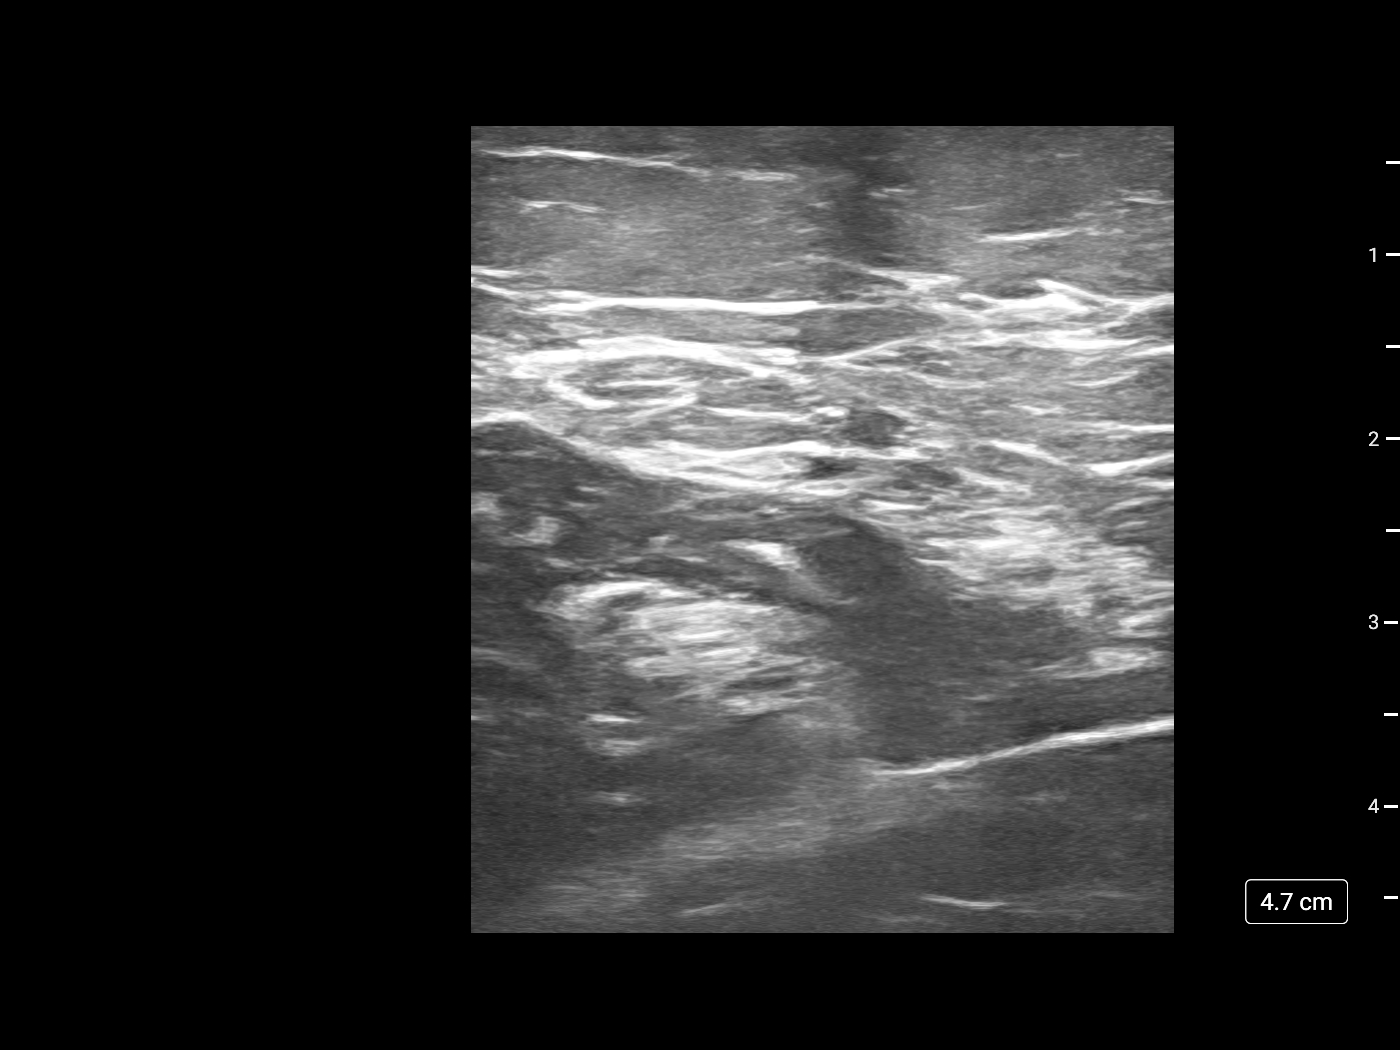

[2 of 2 positions shown; findings below may reference images not displayed]

EXAM:
IMAGE GUIDED PLACEMENT OF PERIPHERAL IV

MEDICATIONS:
None

ANESTHESIA/SEDATION:
None

FLUOROSCOPY TIME:  None

COMPLICATIONS:
None

PROCEDURE:
Informed written consent was obtained from the patient after a
thorough discussion of the procedural risks, benefits and
alternatives. All questions were addressed. Sterile Barrier
Technique was utilized including caps, mask, sterile gloves, sterile
drape, hand hygiene and skin antiseptic. A timeout was performed
prior to the initiation of the procedure.

Ultrasound survey of the upper extremity was performed with images
stored and sent to PACs.

Of note, the patient has very attenuated venous structures of the
right upper extremity, with non-existent right cephalic vein, very
small right basilic vein, and very small brachial veins. The
diameter of the veins are significantly decreased when compared to
the diameter of the arm.

A micropuncture needle was ultimately used access the proximal right
basilic under ultrasound. With venous blood flow returned, an .018
micro wire was passed through the needle into the vein. The needle
was removed, and a micropuncture sheath was placed over the wire.

Failure of distal access of the brachial vein as well as distal
basilic vein before successful puncture.

The inner dilator and wire were removed, and the catheter was
secured in position after attaching to saline flush.

Patient tolerated the procedure well and remained hemodynamically
stable throughout.

No complications were encountered and no significant blood loss.
IMPRESSION: Ultrasound-guided then a puncture of the right brachial vein.

## 2019-05-14 IMAGING — CT CT ANGIO CHEST
2 of 16 series · 15 of 37 positions shown · IV contrast (APPLIED)
Comparison: None.

CLINICAL DATA: 73-year-old female with history of severe aortic
stenosis. Preprocedural study prior to potential transcatheter
aortic valve replacement (TAVR) procedure.

EXAM:
CT ANGIOGRAPHY CHEST, ABDOMEN AND PELVIS
TECHNIQUE: Multidetector CT imaging through the chest, abdomen and pelvis was
performed using the standard protocol during bolus administration of
intravenous contrast. Multiplanar reconstructed images and MIPs were
obtained and reviewed to evaluate the vascular anatomy.
CONTRAST:  100mL OMNIPAQUE IOHEXOL 350 MG/ML SOLN

[Series 8: 0-90% · axial · 0.39mm/px · z∈[-272,-122]mm · 7 of 3340 slices shown]
[im 418/3340  lung]
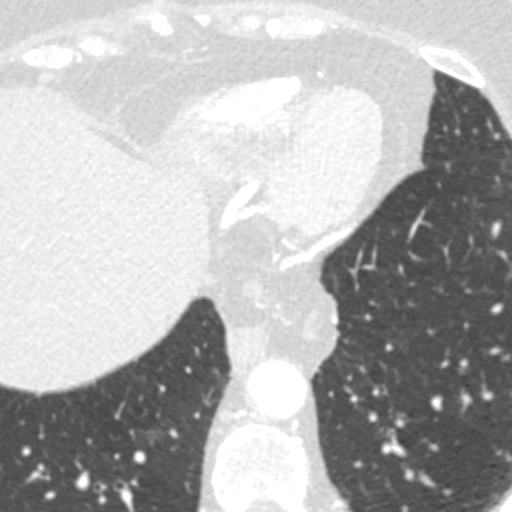
[im 835/3340  lung]
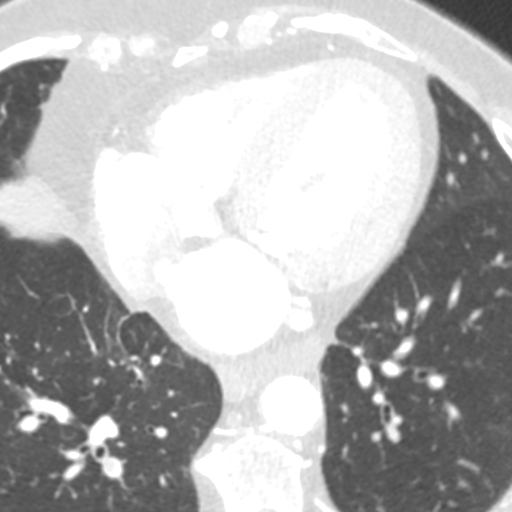
[im 1253/3340  lung]
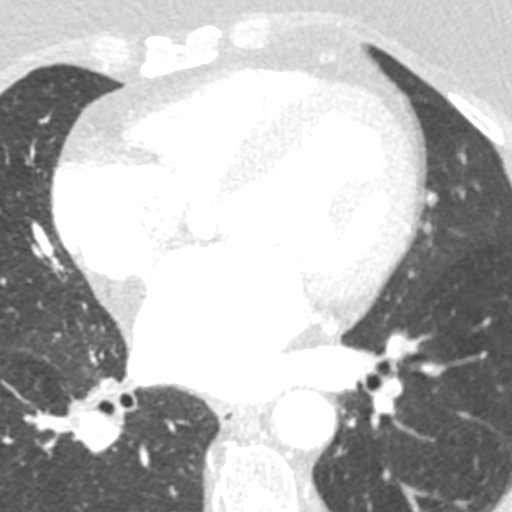
[im 1670/3340  lung]
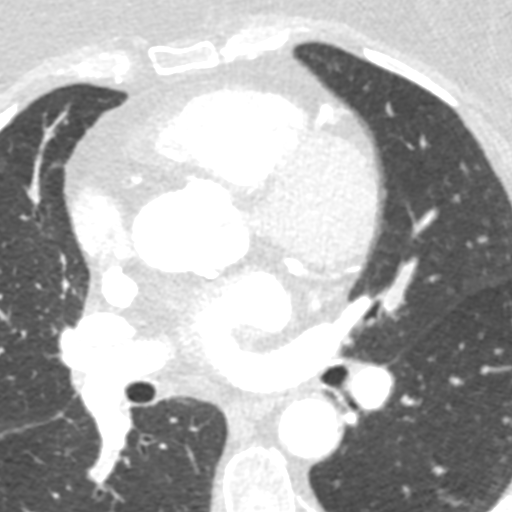
[im 2087/3340  lung]
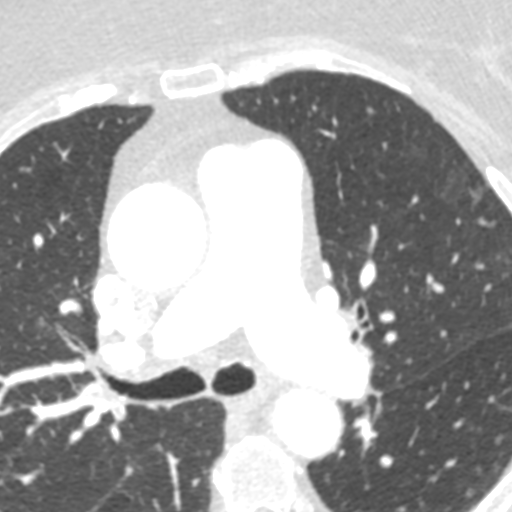
[im 2505/3340  lung]
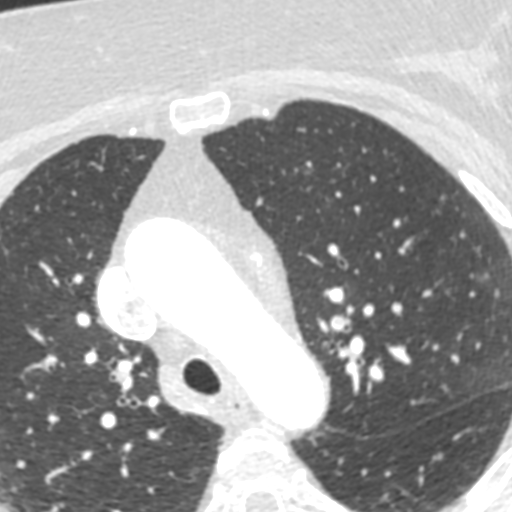
[im 2922/3340  lung]
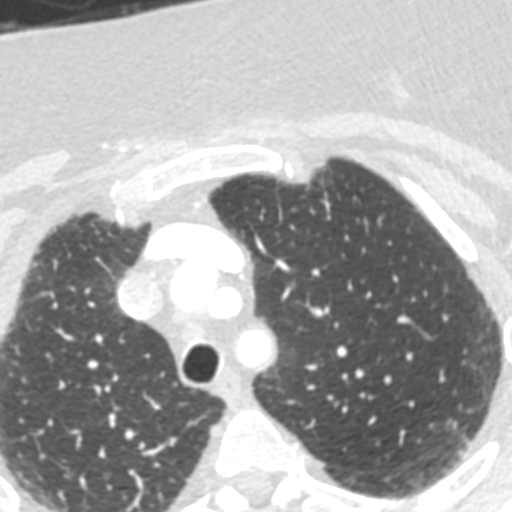

[Series 9: 5-95% · axial · 0.39mm/px · z∈[-275,-119]mm · 8 of 3340 slices shown]
[im 372/3340  lung]
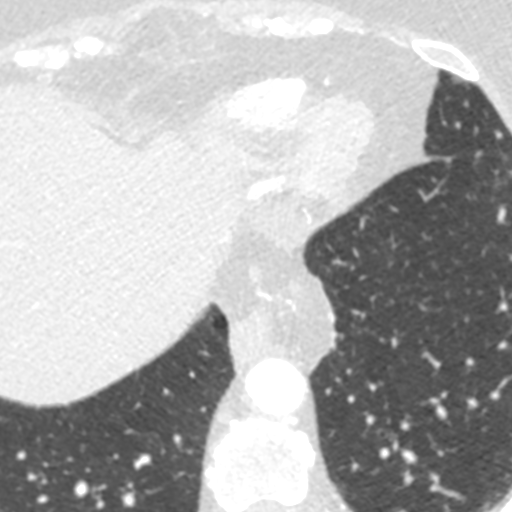
[im 743/3340  mediastinal]
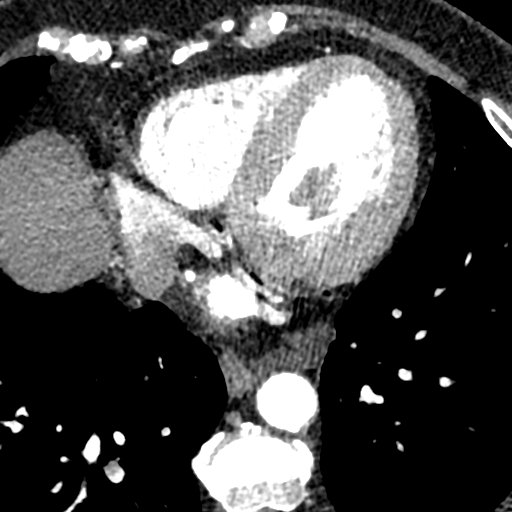
[im 1114/3340  lung]
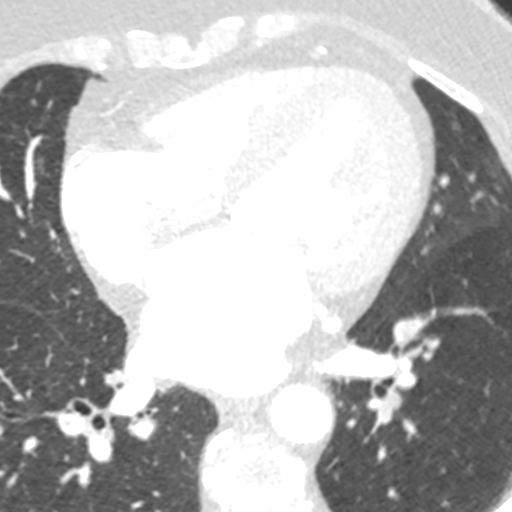
[im 1485/3340  mediastinal]
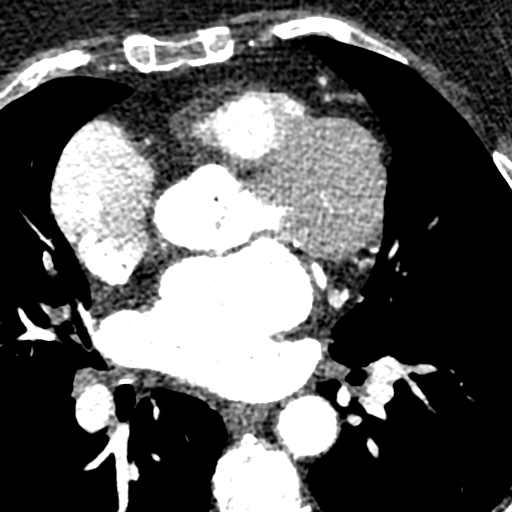
[im 1856/3340  lung]
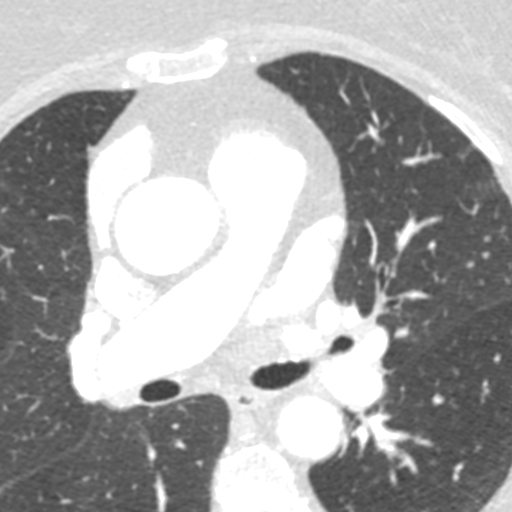
[im 2227/3340  mediastinal]
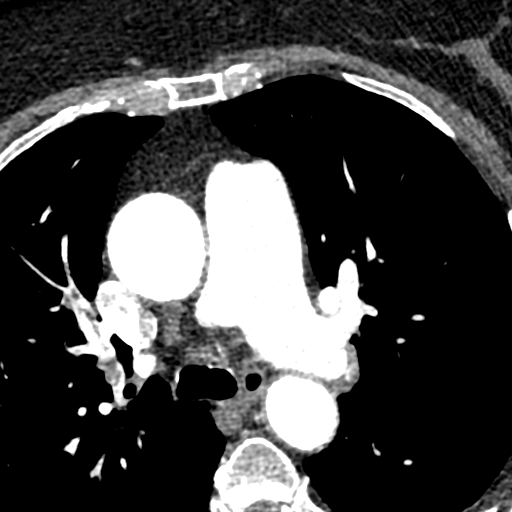
[im 2598/3340  lung]
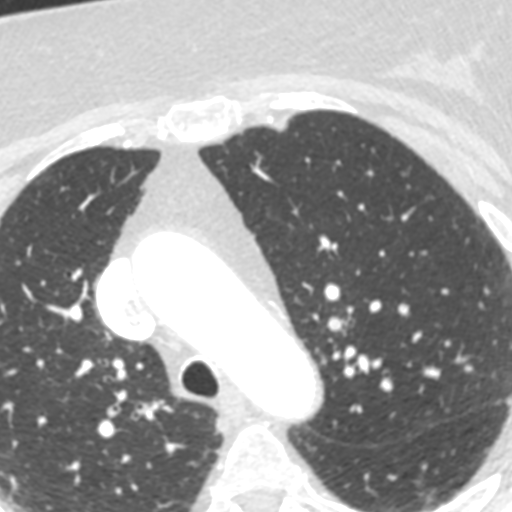
[im 2969/3340  mediastinal]
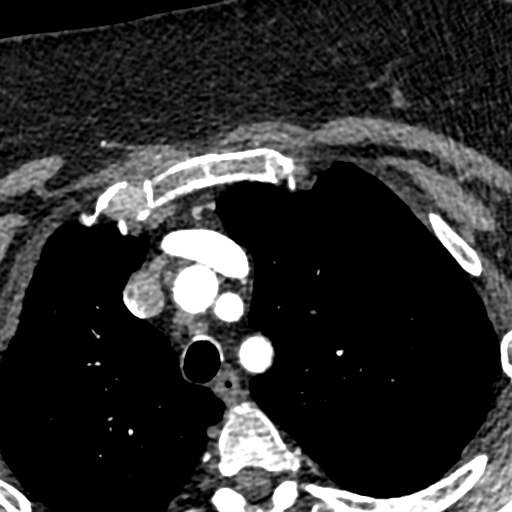

[15 of 37 positions shown; findings below may reference images not displayed]

FINDINGS: CTA CHEST FINDINGS

Cardiovascular: Heart size is mildly enlarged. There is no
significant pericardial fluid, thickening or pericardial
calcification. There is aortic atherosclerosis, as well as
atherosclerosis of the great vessels of the mediastinum and the
coronary arteries, including calcified atherosclerotic plaque in the
left main, left anterior descending, left circumflex and right
coronary arteries. Severe thickening and calcification of the aortic
valve. Calcifications of the mitral annulus. Dilatation of the
pulmonic trunk (3.7 cm in diameter).

Mediastinum/Lymph Nodes: No pathologically enlarged mediastinal or
hilar lymph nodes. Esophagus is unremarkable in appearance. No
axillary lymphadenopathy.

Lungs/Pleura: No acute consolidative airspace disease. No pleural
effusions. A few scattered tiny pulmonary nodules are noted in the
lungs bilaterally measuring 5 mm or less in size. No other larger
more suspicious appearing pulmonary nodules or masses are noted.

Musculoskeletal/Soft Tissues: There are no aggressive appearing
lytic or blastic lesions noted in the visualized portions of the
skeleton.

CTA ABDOMEN AND PELVIS FINDINGS

Hepatobiliary: Diffuse low attenuation throughout the hepatic
parenchyma, indicative of hepatic steatosis. No suspicious cystic or
solid hepatic lesions. No intra or extrahepatic biliary ductal
dilatation. Status post cholecystectomy.

Pancreas: No pancreatic mass. No pancreatic ductal dilatation. No
pancreatic or peripancreatic fluid collections or inflammatory
changes.

Spleen: Unremarkable.

Adrenals/Urinary Tract: 4.2 cm low-attenuation lesion in the
anterior aspect of the interpolar region of the right kidney,
compatible with a simple cyst. Other subcentimeter low-attenuation
lesions in both kidneys, too small to definitively characterize, but
statistically likely to represent tiny cysts. 8 mm nonobstructive
calculus in the lower pole collecting system of the right kidney. No
hydroureteronephrosis. Urinary bladder is normal in appearance.
Bilateral adrenal glands are normal in appearance.

Stomach/Bowel: Normal appearance of the stomach. No pathologic
dilatation of small bowel or colon. Status post appendectomy.

Vascular/Lymphatic: Vascular findings and measurements pertinent to
potential TAVR procedure, as detailed below. Aortic atherosclerosis,
without evidence of aneurysm or dissection in the abdominal or
pelvic vasculature. No lymphadenopathy noted in the abdomen or
pelvis.

Reproductive: Status post hysterectomy. Ovaries are not confidently
identified may be surgically absent or atrophic.

Other: No significant volume of ascites.  No pneumoperitoneum.

Musculoskeletal: Bilateral pars defects at L4 with 4 mm of
anterolisthesis of L4 upon L5. There are no aggressive appearing
lytic or blastic lesions noted in the visualized portions of the
skeleton.

VASCULAR MEASUREMENTS PERTINENT TO TAVR:

AORTA:

Minimal Aortic [Z8] x 13 mm

Severity of Aortic Calcification-moderate

RIGHT PELVIS:

Right Common Iliac Artery -

Minimal [Z8] x 10.3 mm

Tortuosity-mild

Calcification-mild

Right External Iliac Artery -

Minimal [Z8] x 7.6 mm

Tortuosity-severe

Calcification-none

Right Common Femoral Artery -

Minimal [Z8] x 7.0 mm

Tortuosity-mild

Calcification-none

LEFT PELVIS:

Left Common Iliac Artery -

Minimal [Z8] x 10.1 mm

Tortuosity-mild

Calcification-mild

Left External Iliac Artery -

Minimal [Z8] x 8.2 mm

Tortuosity-moderate to severe

Calcification-none

Left Common Femoral Artery -

Minimal [Z8] x 7.9 mm

Tortuosity-mild

Calcification-none

Review of the MIP images confirms the above findings.
IMPRESSION: 1. Vascular findings and measurements pertinent to potential TAVR
procedure, as detailed above.
2. Severe thickening calcification of the aortic valve, compatible
with reported clinical history of severe aortic stenosis.
3. Dilatation of the pulmonic trunk (3.7 cm in diameter), concerning
for pulmonary arterial hypertension.
4. Calcifications of the mitral annulus.
5. Mild cardiomegaly.
6. Multiple small pulmonary nodules measuring 5 mm or less in size,
nonspecific, but statistically likely benign. No follow-up needed if
patient is low-risk (and has no known or suspected primary
neoplasm). Non-contrast chest CT can be considered in 12 months if
patient is high-risk. This recommendation follows the consensus
statement: Guidelines for Management of Incidental Pulmonary Nodules
Detected on CT Images: From the [HOSPITAL] [Z8]; Radiology
[Z8]; [DATE].
7. 8 mm nonobstructive calculus in the lower pole collecting system
of the right kidney.
8. Additional incidental findings, as above.
9.

## 2019-05-14 MED ORDER — LIDOCAINE HCL 1 % IJ SOLN
INTRAMUSCULAR | Status: AC
  Filled 2019-05-14: qty 20

## 2019-05-14 MED ORDER — LIDOCAINE HCL (PF) 1 % IJ SOLN
INTRAMUSCULAR | Status: DC | PRN
  Administered 2019-05-14: 10 mL

## 2019-05-14 MED ORDER — IOHEXOL 350 MG/ML SOLN
100.0000 mL | Freq: Once | INTRAVENOUS | Status: AC | PRN
  Administered 2019-05-14: 100 mL via INTRAVENOUS

## 2019-05-14 NOTE — Procedures (Signed)
Interventional Radiology Procedure Note  Procedure: Placement of a right basilic vein US guided venous access.  CT outpatient.  Complications: None Recommendations:  - Ok to use  Signed,  Dulcy Fanny. Earleen Newport, DO

## 2019-05-15 ENCOUNTER — Encounter: Payer: Self-pay | Admitting: Surgery

## 2019-05-15 ENCOUNTER — Institutional Professional Consult (permissible substitution): Payer: Medicare Other | Admitting: Surgery

## 2019-05-15 ENCOUNTER — Other Ambulatory Visit: Payer: Self-pay

## 2019-05-15 ENCOUNTER — Ambulatory Visit: Payer: Medicare Other | Attending: Internal Medicine

## 2019-05-15 VITALS — BP 142/70 | HR 59 | Temp 97.7°F | Resp 20 | Ht 65.0 in | Wt 240.0 lb

## 2019-05-15 DIAGNOSIS — I350 Nonrheumatic aortic (valve) stenosis: Secondary | ICD-10-CM | POA: Diagnosis not present

## 2019-05-15 DIAGNOSIS — Z20822 Contact with and (suspected) exposure to covid-19: Secondary | ICD-10-CM

## 2019-05-15 NOTE — Progress Notes (Signed)
Patient ID: Elizabeth [name], female   DOB: 1946-03-01, 74 y.o.   MRN: [id number]  [facility] CONSULTATION REPORT  Referring Provider is Richardo Priest, MD Primary Cardiologist is No primary care provider on file. PCP is Marco Collie, MD  Chief Complaint  Patient presents with  . Aortic Stenosis    Surgical consult    HPI:  The patient is a 74 year old woman with a history of hypertension, stage II chronic kidney disease, and aortic stenosis.  She was admitted with congestive heart failure in June 2020 and had an echocardiogram performed which showed an ejection fraction of 55 to 60%.  This reportedly showed a bicuspid aortic valve with severe thickening and moderate calcification and a peak velocity of 4.2 m/s with an aortic valve area of 0.6 to 0.7 cm consistent with severe aortic stenosis.  She had a follow-up echocardiogram on 12/31/2018 which showed an aortic valve mean gradient of 49 mmHg with a peak gradient of 69 mmHg and an aortic valve area by VTI measuring 0.58 cm.  Left ventricular ejection fraction was 55 to 60%.  She was seen by Dr. Bettina Gavia in November 2020 and referred for cardiac catheterization which was performed on 03/03/2019.  This showed nonobstructive coronary disease and a mean gradient of 43 mmHg across the aortic valve consistent with severe aortic stenosis.  Right heart pressures were within normal limits.  She was scheduled for TAVR work-up and plan potential surgery in mid January but had to delay her appointment so that she could have a noninvasive knee procedure.  She decided that she did not want to cancel the knee procedure due to the amount of pain that she was having.  Then she became very ill with suspected COVID-19 infection along with multiple other family members.  She lost over 25 pounds and was very weak and fatigued.  She is here today by herself and said that her husband is  in [facility] and scheduled for coronary bypass graft surgery and aortic valve replacement by Dr. Darcey Nora tomorrow.  They have been married 61 years and have never spent a night apart.  She thought that they were both in good condition until recently.  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  She reports progressive exertional shortness of breath over the past several weeks.  She tried to visit her husband in the hospital yesterday but could not walk down the hall without stopping multiple times.  She denies any dizziness or syncope.  She has had no chest pain.  She denies peripheral edema   Past Medical History:  Diagnosis Date  . Anxiety    extreme  . Benign hypertension with CKD (chronic kidney disease), stage II   . Claustrophobia   . GERD (gastroesophageal reflux disease)   . Hyperuricemia   . Lumbar stenosis   . Osteoarthrosis   . Severe aortic stenosis   . Vitamin D deficiency     Past Surgical History:  Procedure Laterality Date  . ABDOMINAL HYSTERECTOMY    . APPENDECTOMY    . GALLBLADDER SURGERY    . IR RADIOLOGY PERIPHERAL GUIDED IV START  05/14/2019  . IR US GUIDE VASC ACCESS RIGHT  05/14/2019  . LUMBAR LAMINECTOMY/DECOMPRESSION MICRODISCECTOMY N/A 06/12/2017   Procedure: LAMINECTOMY AND FORAMINOTOMY LUMBAR THREE- LUMBAR FOUR, LUMBAR FOUR- LUMBAR FIVE;  Surgeon: Newman Pies, MD;  Location: [facility];  Service: Neurosurgery;  Laterality: N/A;  . RIGHT/LEFT HEART CATH AND CORONARY ANGIOGRAPHY N/A 03/11/2019   Procedure: RIGHT/LEFT HEART CATH AND CORONARY ANGIOGRAPHY;  Surgeon: Sherren Mocha, MD;  Location: [facility];  Service: Cardiovascular;  Laterality: N/A;    Family History  Problem Relation Age of Onset  . Alzheimer's disease Mother   . Aneurysm Father   . Stomach cancer Brother     Social History   Socioeconomic History  . Marital status: Married    Spouse name: Not on file  . Number of children: 2   . Years of education: 44  . Highest education level: Not on file  Occupational History  . Occupation: child care  Tobacco Use  . Smoking status: Never Smoker  . Smokeless tobacco: Never Used  Substance and Sexual Activity  . Alcohol use: No  . Drug use: No  . Sexual activity: Not on file  Other Topics Concern  . Not on file  Social History Narrative   Lives with husband in a one story home.  Has 2 children.  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  Education: high school.     Social Determinants of Health   Financial Resource Strain:   . Difficulty of Paying Living Expenses: Not on file  Food Insecurity:   . Worried About Charity fundraiser in the Last Year: Not on file  . Ran Out of Food in the Last Year: Not on file  Transportation Needs:   . Lack of Transportation (Medical): Not on file  . Lack of Transportation (Non-Medical): Not on file  Physical Activity:   . Days of Exercise per Week: Not on file  . Minutes of Exercise per Session: Not on file  Stress:   . Feeling of Stress : Not on file  Social Connections:   . Frequency of Communication with Friends and Family: Not on file  . Frequency of Social Gatherings with Friends and Family: Not on file  . Attends Religious Services: Not on file  . Active Member of Clubs or Organizations: Not on file  . Attends Archivist Meetings: Not on file  . Marital Status: Not on file  Intimate Partner Violence:   . Fear of Current or Ex-Partner: Not on file  . Emotionally Abused: Not on file  . Physically Abused: Not on file  . Sexually Abused: Not on file    Current Outpatient Medications  Medication Sig Dispense Refill  . amLODipine (NORVASC) 5 MG tablet Take 1 tablet (5 mg total) by mouth daily. 90 tablet 0  . carvedilol (COREG) 6.25 MG tablet Take 6.25 mg by mouth 2 (two) times daily with a meal.     . fexofenadine (ALLEGRA) 180 MG tablet SMARTSIG:1 Tablet(s) By Mouth As Needed    . furosemide (LASIX) 20 MG tablet Take  1 tablet (20 mg total) by mouth daily. 90 tablet 0  . lisinopril (ZESTRIL) 10 MG tablet Take 10 mg by mouth daily.     Marland Kitchen omeprazole (PRILOSEC) 40 MG capsule Take 40 mg by mouth daily as needed (acid reflux).     . tamoxifen (NOLVADEX) 20 MG tablet TAKE 1 TABLET(20 MG) BY MOUTH DAILY 90 tablet 3   No current facility-administered medications for this visit.    Allergies  Allergen Reactions  . Codeine Nausea And Vomiting  . Lipitor [Atorvastatin] Itching and Other (See Comments)    Redness and itching all over body  . Penicillins Hives    Has patient had  a PCN reaction causing immediate rash, facial/tongue/throat swelling, SOB or lightheadedness with hypotension: Yes Has patient had a PCN reaction causing severe rash involving mucus membranes or skin necrosis: Yes Has patient had a PCN reaction that required hospitalization: No Has patient had a PCN reaction occurring within the last 10 years: No If all of the above answers are "NO", then may proceed with Cephalosporin use.   . Prednisone Other (See Comments)    Red and hot all over       Review of Systems:   General:  normal appetite, + decreased energy, no weight gain, no weight loss, no fever  Cardiac:  no chest pain with exertion, no chest pain at rest, +SOB with mild exertion, no resting SOB, no PND, no orthopnea, no palpitations, no arrhythmia, no atrial fibrillation, no LE edema, no dizzy spells, no syncope  Respiratory:  + exertional shortness of breath, no home oxygen, no productive cough, no dry cough, no bronchitis, no wheezing, no hemoptysis, no asthma, no pain with inspiration or cough, no sleep apnea, no CPAP at night  GI:   no difficulty swallowing, no reflux, no frequent heartburn, no hiatal hernia, no abdominal pain, no constipation, no diarrhea, no hematochezia, no hematemesis, no melena  GU:   no dysuria,  no frequency, no urinary tract infection, no hematuria,  no kidney stones, + kidney disease  Vascular:  no pain  suggestive of claudication, no pain in feet, no leg cramps, no varicose veins, no DVT, no non-healing foot ulcer  Neuro:   no stroke, no TIA's, no seizures, no headaches, no temporary blindness one eye,  no slurred speech, no peripheral neuropathy, no chronic pain, no instability of gait, no memory/cognitive dysfunction  Musculoskeletal: + arthritis, no joint swelling, no myalgias, + difficulty walking, + reduced mobility   Skin:   no rash, no itching, no skin infections, no pressure sores or ulcerations  Psych:   no anxiety, no depression, no nervousness, no unusual recent stress  Eyes:   no blurry vision, no floaters, no recent vision changes, + wears glasses or contacts  ENT:   no hearing loss, no loose or painful teeth, no dentures, last saw dentist last year  Hematologic:  no easy bruising, no abnormal bleeding, no clotting disorder, no frequent epistaxis  Endocrine:  no diabetes, does not check CBG's at home       Physical Exam:   BP (!) 142/70 (BP Location: Left Arm, Patient Position: Sitting, Cuff Size: Large)   Pulse (!) 59   Temp 97.7 F (36.5 C) (Temporal)   Resp 20   Ht 5\' 5"  (1.651 m)   Wt 240 lb (108.9 kg)   SpO2 98% Comment: RA  BMI 39.94 kg/m   General:  Elderly, well-appearing  HEENT:  Unremarkable, NCAT, PERLA, EOMI  Neck:   no JVD, no bruits, no adenopathy   Chest:   clear to auscultation, symmetrical breath sounds, no wheezes, no rhonchi   CV:   RRR, grade lll/VI crescendo/decrescendo murmur heard best at RSB,  no diastolic murmur  Abdomen:  soft, non-tender, no masses   Extremities:  warm, well-perfused, pulses palpable at ankle, no LE edema  Rectal/GU  Deferred  Neuro:   Grossly non-focal and symmetrical throughout  Skin:   Clean and dry, no rashes, no breakdown   Diagnostic Tests:  ECHOCARDIOGRAM REPORT       Patient Name:  SHALINI STINEBAUGH Squyres Date of Exam: 12/31/2018  Medical Rec #: [id number]  Height:    65.5 in  Accession #:   [id number]      Weight:    256.0 lb  Date of Birth: 05/19/1945      BSA:     2.21 m  Patient Age:  10 years       BP:      134/80 mmHg  Patient Gender: F          HR:      55 bpm.  Exam Location: [facility]   Procedure: 2D Echo, Cardiac Doppler and Color Doppler   Indications:  Aortic Stenosis, chronic diastolic heart failure    History:    Patient has no prior history of Echocardiogram  examinations.         CHF; Aortic Valve Disease Signs/Symptoms:Murmur Risk         Factors:Hypertension.    Sonographer:  Cardell Peach RDCS (AE)  Referring Phys: [id number] [place]    1. Left ventricular ejection fraction, by visual estimation, is 55 to  60%. The left ventricle has normal function. Normal left ventricular size.  Left ventricular septal wall thickness was mildly increased. Mildly  increased left ventricular posterior  wall thickness. There is mildly increased left ventricular hypertrophy.  2. Elevated mean left atrial pressure.  3. Left ventricular diastolic Doppler parameters are consistent with  pseudonormalization pattern of LV diastolic filling.  4. Global right ventricle has normal systolic function.The right  ventricular size is normal. No increase in right ventricular wall  thickness.  5. Left atrial size was normal.  6. Right atrial size was normal.  7. Mild to moderate mitral annular calcification.  8. The mitral valve is normal in structure. No evidence of mitral valve  regurgitation. No evidence of mitral stenosis.  9. The tricuspid valve is normal in structure. Tricuspid valve  regurgitation is mild.  10. The aortic valve is bicuspid Aortic valve regurgitation is mild by  color flow Doppler. Severe aortic valve stenosis.  11. There is Moderate calcification of the aortic valve.  12. There is Severely thickening of the aortic valve.  13. The pulmonic valve was  normal in structure. Pulmonic valve  regurgitation is not visualized by color flow Doppler.  14. There is mild to moderate dilatation of the ascending aorta measuring  39 mm.  15. Normal pulmonary artery systolic pressure.  16. The inferior vena cava is normal in size with greater than 50%  respiratory variability, suggesting right atrial pressure of 3 mmHg.   FINDINGS  Left Ventricle: Left ventricular ejection fraction, by visual estimation,  is 55 to 60%. The left ventricle has normal function. No evidence of left  ventricular regional wall motion abnormalities. Left ventricular septal  wall thickness was mildly  increased. Mildly increased left ventricular posterior wall thickness.  There is mildly increased left ventricular hypertrophy. Normal left  ventricular size. Spectral Doppler shows Left ventricular diastolic  Doppler parameters are consistent with  pseudonormalization pattern of LV diastolic filling. Elevated mean left  atrial pressure.   Right Ventricle: The right ventricular size is normal. No increase in  right ventricular wall thickness. Global RV systolic function is has  normal systolic function. The tricuspid regurgitant velocity is 1.90 m/s,  and with an assumed right atrial pressure  of 10 mmHg, the estimated right ventricular systolic pressure is normal  at 24.4 mmHg.   Left Atrium: Left atrial size was normal in size.   Right Atrium: Right atrial size was normal in size  Pericardium: There is no evidence of pericardial effusion.   Mitral Valve: The mitral valve is normal in structure. Mild to moderate  mitral annular calcification. No evidence of mitral valve stenosis by  observation. No evidence of mitral valve regurgitation.   Tricuspid Valve: The tricuspid valve is normal in structure. Tricuspid  valve regurgitation is mild by color flow Doppler.   Aortic Valve: The aortic valve is bicuspid. There is Severely thickening  and Moderate calcification  of the aortic valve. Aortic valve regurgitation  is mild by color flow Doppler. Aortic regurgitation PHT measures 507 msec.  Severe aortic stenosis is  present. There is Severely thickening of the aortic valve. Moderate  calcification. Aortic valve mean gradient measures 49.2 mmHg. Aortic valve  peak gradient measures 69.3 mmHg. Aortic valve area, by VTI measures 0.58  cm. The peak aortic velocity was  obtained from the apical view.   Pulmonic Valve: The pulmonic valve was normal in structure. Pulmonic valve  regurgitation is not visualized by color flow Doppler. No evidence of  pulmonic stenosis.   Aorta: The aortic root is normal in size and structure. There is mild to  moderate dilatation of the ascending aorta measuring 39 mm.   Venous: A systolic blunting flow pattern is recorded from the right upper  pulmonary vein. The inferior vena cava is normal in size with greater than  50% respiratory variability, suggesting right atrial pressure of 3 mmHg.   IAS/Shunts: No atrial level shunt detected by color flow Doppler. No  ventricular septal defect is seen or detected. There is no evidence of an  atrial septal defect.      LEFT VENTRICLE  PLAX 2D  LVIDd:     5.65 cm Diastology  LVIDs:     3.80 cm LV e' lateral:  7.40 cm/s  LV PW:     1.21 cm LV E/e' lateral: 18.8  LV IVS:    1.26 cm LV e' medial:  5.00 cm/s  LVOT diam:   1.70 cm LV E/e' medial: 27.8  LV SV:     95 ml  LV SV Index:  39.92  LVOT Area:   2.27 cm     RIGHT VENTRICLE       IVC  RV Basal diam: 2.97 cm   IVC diam: 1.64 cm  RV S prime:   12.50 cm/s  TAPSE (M-mode): 2.5 cm   LEFT ATRIUM       Index    RIGHT ATRIUM      Index  LA diam:    5.30 cm 2.40 cm/m RA Area:   15.90 cm  LA Vol (A2C):  86.1 ml 38.97 ml/m RA Volume:  37.00 ml 16.75 ml/m  LA Vol (A4C):  56.4 ml 25.53 ml/m  LA Biplane Vol: 69.9 ml 31.64 ml/m  AORTIC VALVE  AV  Area (Vmax):  0.63 cm  AV Area (Vmean):  0.56 cm  AV Area (VTI):   0.58 cm  AV Vmax:      416.25 cm/s  AV Vmean:     337.750 cm/s  AV VTI:      1.210 m  AV Peak Grad:   69.3 mmHg  AV Mean Grad:   49.2 mmHg  LVOT Vmax:     116.00 cm/s  LVOT Vmean:    82.800 cm/s  LVOT VTI:     0.309 m  LVOT/AV VTI ratio: 0.26  AI PHT:      507 msec    AORTA  Ao Root diam: 2.80  cm  Ao Asc diam: 3.90 cm   MITRAL VALVE             TRICUSPID VALVE  MV Area (PHT): 3.42 cm       TR Peak grad:  14.4 mmHg  MV PHT:    64.38 msec      TR Vmax:    190.00 cm/s  MV Decel Time: 222 msec  MV E velocity: 139.00 cm/s 103 cm/s SHUNTS  MV A velocity: 96.70 cm/s 70.3 cm/s Systemic VTI: 0.31 m  MV E/A ratio: 1.44    1.5    Systemic Diam: 1.70 cm     Shirlee More MD  Electronically signed by Shirlee More MD  Signature Date/Time: 12/31/2018/1:01:15 PM     Physicians Panel Physicians Referring Physician Case Authorizing Physician  Sherren Mocha, MD (Primary)       Procedures RIGHT/LEFT HEART CATH AND CORONARY ANGIOGRAPHY     Conclusion 1. Severe aortic stenosis with bulky calcification of the aortic valve leaflets, restricted mobility, and a mean transvalvular gradient of 43 mmHg.  2. Normal right heart pressures  3. Nonobstructive CAD, left-dominant, appropriate for medical therapy of coronary disease  Recommend: multidisciplinary heart team evaluation for aortic valve replacement           Indications Severe aortic stenosis [I35.0 (ICD-10-CM)]     Procedural Details Technical Details INDICATION: Severe, symptomatic aortic stenosis  PROCEDURAL DETAILS: The right wrist was then prepped, draped, and anesthetized with 1% lidocaine. Using direct ultrasound guidance, antecubital venous access is obtained and a 4/5 French slender sheath is introduced. Using ultrasound guidance technique a 5/6 French  Slender sheath was placed in the right radial artery. Ultrasound images are captured and stored in the patient's chart. Intra-arterial verapamil was administered through the radial artery sheath. IV heparin was administered after a JR4 catheter was advanced into the central aorta. A Swan-Ganz catheter was used for the right heart catheterization. Standard protocol was followed for recording of right heart pressures and sampling of oxygen saturations. Fick cardiac output was calculated. Standard Judkins catheters were used for selective coronary angiography. LV pressure is recorded and an aortic valve pullback is performed. The patient has left dominant coronary anatomy. The RCA could not be selectively cannulated. An aortic root angiogram was performed via power injector. There were no immediate procedural complications. The patient was transferred to the post catheterization recovery area for further monitoring.    Estimated blood loss <50 mL.   During this procedure medications were administered to achieve and maintain moderate conscious sedation while the patient's heart rate, blood pressure, and oxygen saturation were continuously monitored and I was present face-to-face 100% of this time.     Medications (Filter: Administrations occurring from 03/11/19 0840 to 03/11/19 0959)  Continuous medications are totaled by the amount administered until 03/11/19 0959.  Heparin (Porcine) in NaCl 1000-0.9 UT/500ML-% SOLN (mL) Total volume: 1,000 mL  Date/Time   Rate/Dose/Volume Action  03/11/19 0855  500 mL Given  0856  500 mL Given  lidocaine (PF) (XYLOCAINE) 1 % injection (mL) Total volume: 4 mL  Date/Time   Rate/Dose/Volume Action  03/11/19 0905  2 mL Given  0913  2 mL Given  midazolam (VERSED) injection (mg) Total dose: 4 mg  Date/Time   Rate/Dose/Volume Action  03/11/19 0901  2 mg Given  0912  1 mg Given  0936  1 mg Given  fentaNYL (SUBLIMAZE) injection (mcg) Total dose: 75 mcg    Date/Time   Rate/Dose/Volume Action  03/11/19  0901  25 mcg Given  0913  25 mcg Given  0936  25 mcg Given  Radial Cocktail/Verapamil only (mL) Total volume: 10 mL  Date/Time   Rate/Dose/Volume Action  03/11/19 0915  10 mL Given  heparin injection (Units) Total dose: 5,000 Units  Date/Time   Rate/Dose/Volume Action  03/11/19 0927  5,000 Units Given  iohexol (OMNIPAQUE) 350 MG/ML injection (mL) Total volume: 95 mL  Date/Time   Rate/Dose/Volume Action  03/11/19 0944  95 mL Given  fentaNYL (SUBLIMAZE) injection (mcg) Total dose: 25 mcg  Date/Time   Rate/Dose/Volume Action  03/11/19 0958  25 mcg Given     Sedation Time Sedation Time Physician-1: 41 minutes 16 seconds        Contrast Medication Name Total Dose  iohexol (OMNIPAQUE) 350 MG/ML injection 95 mL     Radiation/Fluoro Fluoro time: 9.2 (min)  DAP: 25935 (mGycm2)  Cumulative Air Kerma: 386 (mGy)        Coronary Findings Diagnostic Dominance: Left  Left Anterior Descending  Prox LAD to Mid LAD lesion 40% stenosed  Prox LAD to Mid LAD lesion is 40% stenosed. There is diffuse nonobstructive mid LAD stenosis   Left Circumflex  The vessel exhibits minimal luminal irregularities. Large, dominant circumflex with minimal irregularity, no significant stenosis.   Right Coronary Artery  Not selectively injected. Appears to be diminutive.  Intervention No interventions have been documented.                         Left Heart Aortic Valve There is severe aortic valve stenosis. The aortic valve is calcified. There is restricted aortic valve motion. Mean transvalvular gradient 43 mmHg, peak to peak gradient 56 mmHg. There is bulky calcification of the aortic valve leaflets with severely reduced mobility.     Coronary Diagrams Diagnostic Dominance: Left  &&&&&  Intervention      Implants  No implant documentation for this case.      Syngo Images Link to Procedure Log  Show images for  CARDIAC CATHETERIZATION Procedure Log     Images on Long Term Storage   Show images for Airalyn, Selix [facility]         Hemo Data   Most Recent Value  Fick Cardiac Output 8.6 L/min  Fick Cardiac Output Index 3.97 (L/min)/BSA  Aortic Mean Gradient 43.3 mmHg  Aortic Peak Gradient 56 mmHg  Aortic Valve Area 1.59  Aortic Value Area Index 0.74 cm2/BSA  RA A Wave 9 mmHg  RA V Wave 7 mmHg  RA Mean 5 mmHg  RV Systolic Pressure 30 mmHg  RV Diastolic Pressure 2 mmHg  RV EDP 8 mmHg  PA Systolic Pressure 31 mmHg  PA Diastolic Pressure 8 mmHg  PA Mean 19 mmHg  PW A Wave 16 mmHg  PW V Wave 17 mmHg  PW Mean 13 mmHg  AO Systolic Pressure [id number] mmHg  AO Diastolic Pressure 55 mmHg  AO Mean 81 mmHg  LV Systolic Pressure [id number] mmHg  LV Diastolic Pressure 7 mmHg  LV EDP 13 mmHg  AOp Systolic Pressure [id number] mmHg  AOp Diastolic Pressure 56 mmHg  AOp Mean Pressure 82 mmHg  LVp Systolic Pressure 0000000 mmHg  LVp Diastolic Pressure 6 mmHg  LVp EDP Pressure 13 mmHg  QP/QS 1  TPVR Index 4.78 HRUI  TSVR Index 20.38 HRUI  PVR SVR Ratio 0.08  TPVR/TSVR Ratio 0.23    ADDENDUM REPORT: 05/14/2019 17:18  CLINICAL DATA:  74 -year-old female with severe  aortic stenosis being evaluated for a TAVR procedure.  EXAM: Cardiac TAVR CT  TECHNIQUE: The patient was scanned on a [employer]. A 120 kV retrospective scan was triggered in the descending thoracic aorta at 111 HU's. Gantry rotation speed was 250 msecs and collimation was .6 mm. No beta blockade or nitro were given. The 3D data set was reconstructed in 5% intervals of the R-R cycle. Systolic and diastolic phases were analyzed on a dedicated work station using MPR, MIP and VRT modes. The patient received 80 cc of contrast.  FINDINGS: Aortic Root:  Aortic valve: Tricuspid  Aortic valve calcium score: 2715  Aortic annulus:  Diameter: 27mm x 23mm  Perimeter: 59mm  Area: 423 mm^2  Calcifications: No  calcifications  Coronary height: Min Left -110mm , Max Left -27mm ; Min Right - 93mm  Sinotubular height: Left cusp -48mm ; Right cusp - 70mm; Noncoronary cusp - 53mm  LVOT (as measured 3 mm below the annulus):  Diameter: 22mm x 57mm  Area: 372mm^2  Calcifications: Moderate LVOT calcification located 47mm inferior to annulus, beneath noncoronary cusp  Aortic sinus width: Left cusp - 31mm; Right cusp - 64mm; Noncoronary cusp -66mm;  Sinotubular junction width: 66mm x 59mm  Optimum Fluoroscopic Angle for Delivery: LAO 3 CRA 21  Cardiac:  Right atrium: Normal size  Right ventricle: Mildly dilated  Pulmonary arteries: Mildly dilated, measures 2mm in main PA  Pulmonary veins: Normal configuration  Left atrium: Moderately dilated  Left ventricle: Moderately dilated  Pericardium: Normal thickness  Coronary arteries: Calcium score 494 (88th percentile)  IMPRESSION: 1.  Tricuspid aortic valve, severely calcified (calcium score 2715)  2. Aortic annulus measures 100mm x 56mm in diameter with area 423 mm^2 and perimeter 79mm. No annular calcifications. Annular measurements suitable for delivery of 36mm Edwards-Sapien 3 valve  3. Moderate LVOT calcification located 73mm inferior to annulus, beneath noncoronary cusp  4. Sufficient coronary to annulus distance, measures 48mm from left main to annulus, and 64mm from RCA to annulus  5. Optimum Fluoroscopic Angle for Delivery (centered on right coronary cusp): LAO 3 CRA 21  6.  Coronary calcium score 494 (88th percentile)   Electronically Signed   By: Oswaldo Milian MD   On: 05/14/2019 17:18   Addended by Donato Heinz, MD on 05/14/2019 5:21 PM    Study Result  EXAM: OVER-READ INTERPRETATION  CT CHEST  The following report is an over-read performed by radiologist Dr. Vinnie Langton of [facility], [place] on 05/14/2019. This over-read does not include interpretation of  cardiac or coronary anatomy or pathology. The coronary calcium score/coronary CTA interpretation by the cardiologist is attached.  COMPARISON:  None.  FINDINGS: Extracardiac findings will be described separately under dictation for contemporaneously obtained CTA chest, abdomen and pelvis.  IMPRESSION: Please see separate dictation for contemporaneously obtained CTA chest, abdomen and pelvis 05/14/2019 for full description of relevant extracardiac findings.  Electronically Signed: By: Vinnie Langton M.D. On: 05/14/2019 10:56      CLINICAL DATA:  74 year old female with history of severe aortic stenosis. Preprocedural study prior to potential transcatheter aortic valve replacement (TAVR) procedure.  EXAM: CT ANGIOGRAPHY CHEST, ABDOMEN AND PELVIS  TECHNIQUE: Multidetector CT imaging through the chest, abdomen and pelvis was performed using the standard protocol during bolus administration of intravenous contrast. Multiplanar reconstructed images and MIPs were obtained and reviewed to evaluate the vascular anatomy.  CONTRAST:  12mL OMNIPAQUE IOHEXOL 350 MG/ML SOLN  COMPARISON:  None.  FINDINGS: CTA CHEST FINDINGS  Cardiovascular:  Heart size is mildly enlarged. There is no significant pericardial fluid, thickening or pericardial calcification. There is aortic atherosclerosis, as well as atherosclerosis of the great vessels of the mediastinum and the coronary arteries, including calcified atherosclerotic plaque in the left main, left anterior descending, left circumflex and right coronary arteries. Severe thickening and calcification of the aortic valve. Calcifications of the mitral annulus. Dilatation of the pulmonic trunk (3.7 cm in diameter).  Mediastinum/Lymph Nodes: No pathologically enlarged mediastinal or hilar lymph nodes. Esophagus is unremarkable in appearance. No axillary lymphadenopathy.  Lungs/Pleura: No acute consolidative airspace  disease. No pleural effusions. A few scattered tiny pulmonary nodules are noted in the lungs bilaterally measuring 5 mm or less in size. No other larger more suspicious appearing pulmonary nodules or masses are noted.  Musculoskeletal/Soft Tissues: There are no aggressive appearing lytic or blastic lesions noted in the visualized portions of the skeleton.  CTA ABDOMEN AND PELVIS FINDINGS  Hepatobiliary: Diffuse low attenuation throughout the hepatic parenchyma, indicative of hepatic steatosis. No suspicious cystic or solid hepatic lesions. No intra or extrahepatic biliary ductal dilatation. Status post cholecystectomy.  Pancreas: No pancreatic mass. No pancreatic ductal dilatation. No pancreatic or peripancreatic fluid collections or inflammatory changes.  Spleen: Unremarkable.  Adrenals/Urinary Tract: 4.2 cm low-attenuation lesion in the anterior aspect of the interpolar region of the right kidney, compatible with a simple cyst. Other subcentimeter low-attenuation lesions in both kidneys, too small to definitively characterize, but statistically likely to represent tiny cysts. 8 mm nonobstructive calculus in the lower pole collecting system of the right kidney. No hydroureteronephrosis. Urinary bladder is normal in appearance. Bilateral adrenal glands are normal in appearance.  Stomach/Bowel: Normal appearance of the stomach. No pathologic dilatation of small bowel or colon. Status post appendectomy.  Vascular/Lymphatic: Vascular findings and measurements pertinent to potential TAVR procedure, as detailed below. Aortic atherosclerosis, without evidence of aneurysm or dissection in the abdominal or pelvic vasculature. No lymphadenopathy noted in the abdomen or pelvis.  Reproductive: Status post hysterectomy. Ovaries are not confidently identified may be surgically absent or atrophic.  Other: No significant volume of ascites.  No  pneumoperitoneum.  Musculoskeletal: Bilateral pars defects at L4 with 4 mm of anterolisthesis of L4 upon L5. There are no aggressive appearing lytic or blastic lesions noted in the visualized portions of the skeleton.  VASCULAR MEASUREMENTS PERTINENT TO TAVR:  AORTA:  Minimal Aortic Diameter-13 x 13 mm  Severity of Aortic Calcification-moderate  RIGHT PELVIS:  Right Common Iliac Artery -  Minimal Diameter-10.2 x 10.3 mm  Tortuosity-mild  Calcification-mild  Right External Iliac Artery -  Minimal Diameter-7.2 x 7.6 mm  Tortuosity-severe  Calcification-none  Right Common Femoral Artery -  Minimal Diameter-7.0 x 7.0 mm  Tortuosity-mild  Calcification-none  LEFT PELVIS:  Left Common Iliac Artery -  Minimal Diameter-9.4 x 10.1 mm  Tortuosity-mild  Calcification-mild  Left External Iliac Artery -  Minimal Diameter-7.9 x 8.2 mm  Tortuosity-moderate to severe  Calcification-none  Left Common Femoral Artery -  Minimal Diameter-7.7 x 7.9 mm  Tortuosity-mild  Calcification-none  Review of the MIP images confirms the above findings.  IMPRESSION: 1. Vascular findings and measurements pertinent to potential TAVR procedure, as detailed above. 2. Severe thickening calcification of the aortic valve, compatible with reported clinical history of severe aortic stenosis. 3. Dilatation of the pulmonic trunk (3.7 cm in diameter), concerning for pulmonary arterial hypertension. 4. Calcifications of the mitral annulus. 5. Mild cardiomegaly. 6. Multiple small pulmonary nodules measuring 5 mm or less in size, nonspecific,  but statistically likely benign. No follow-up needed if patient is low-risk (and has no known or suspected primary neoplasm). Non-contrast chest CT can be considered in 12 months if patient is high-risk. This recommendation follows the consensus statement: Guidelines for Management of Incidental Pulmonary  Nodules Detected on CT Images: From the Fleischner Society 2017; Radiology 2017; 284:228-243. 7. 8 mm nonobstructive calculus in the lower pole collecting system of the right kidney. 8. Additional incidental findings, as above. 9.   Electronically Signed   By: Vinnie Langton M.D.   On: 05/14/2019 11:44   STS score   Procedure: Isolated AVR  Risk of Mortality: 2.025%  Renal Failure: 3.277%  Permanent Stroke: 0.839%  Prolonged Ventilation: 8.112%  DSW Infection: 0.175%  Reoperation: 2.638%  Morbidity or Mortality: 12.563%  Short Length of Stay: 32.396%  Long Length of Stay: 5.868%    Impression:  This 74 year old woman has stage D, severe, symptomatic aortic stenosis with New York Heart Association class III symptoms of exertional fatigue and shortness of breath consistent with chronic diastolic congestive heart failure.  I have personally reviewed her 2D echocardiogram, cardiac catheterization, and CTA studies.  Echocardiogram shows a bicuspid aortic valve with severe thickening and moderate calcification with a mean gradient of 49.2 mmHg and a calculated valve area of 0.58 cm consistent with severe aortic stenosis.  Left ventricular ejection fraction is 55 to 60%.  Cardiac catheterization shows mild nonobstructive coronary disease.  The mean transvalvular gradient was measured at 43 mmHg with normal right heart pressures.  I agree that aortic valve replacement is indicated in this patient for relief of her symptoms and to prevent progressive left ventricular deterioration.  She is a low surgical risk patient but is 74 years old and her husband is undergoing coronary bypass graft surgery and aortic valve replacement tomorrow and therefore she is not interested in having open surgical aortic valve replacement and wants to pursue TAVR.  I think TAVR is a reasonable alternative for her.  Her gated cardiac CTA shows anatomy suitable for transcatheter aortic valve replacement using a  SAPIEN 3 valve.  Her abdominal pelvic CTA shows adequate pelvic vascular anatomy to allow transfemoral insertion.  The patient was counseled at length regarding treatment alternatives for management of severe symptomatic aortic stenosis. The risks and benefits of surgical intervention has been discussed in detail. Long-term prognosis with medical therapy was discussed. Alternative approaches such as conventional surgical aortic valve replacement, transcatheter aortic valve replacement, and palliative medical therapy were compared and contrasted at length. This discussion was placed in the context of the patient's own specific clinical presentation and past medical history. All of her questions have been addressed.   Following the decision to proceed with transcatheter aortic valve replacement, a discussion was held regarding what types of management strategies would be attempted intraoperatively in the event of life-threatening complications, including whether or not the patient would be considered a candidate for the use of cardiopulmonary bypass and/or conversion to open sternotomy for attempted surgical intervention.  She is a low surgical risk patient and would be a candidate for emergent sternotomy if necessary to manage any intraoperative complications.    The patient has been advised of a variety of complications that might develop including but not limited to risks of death, stroke, paravalvular leak, aortic dissection or other major vascular complications, aortic annulus rupture, device embolization, cardiac rupture or perforation, mitral regurgitation, acute myocardial infarction, arrhythmia, heart block or bradycardia requiring permanent pacemaker placement, congestive heart failure, respiratory failure,  renal failure, pneumonia, infection, other late complications related to structural valve deterioration or migration, or other complications that might ultimately cause a temporary or permanent loss  of functional independence or other long term morbidity. The patient provides full informed consent for the procedure as described and all questions were answered.     Plan:  She will be scheduled for transfemoral transcatheter aortic valve replacement using a SAPIEN 3 valve on 05/28/2019  I spent 60 minutes performing this consultation and > 50% of this time was spent face to face counseling and coordinating the care of this patient's severe symptomatic aortic stenosis.     Gaye Pollack, MD 05/15/2019 2:02 PM

## 2019-05-16 ENCOUNTER — Encounter: Payer: Self-pay | Admitting: Surgery

## 2019-05-16 LAB — NOVEL CORONAVIRUS, NAA: SARS-CoV-2, NAA: NOT DETECTED

## 2019-05-21 ENCOUNTER — Other Ambulatory Visit: Payer: Self-pay

## 2019-05-21 DIAGNOSIS — I350 Nonrheumatic aortic (valve) stenosis: Secondary | ICD-10-CM

## 2019-05-24 ENCOUNTER — Other Ambulatory Visit: Payer: Self-pay

## 2019-05-24 ENCOUNTER — Other Ambulatory Visit (HOSPITAL_COMMUNITY)
Admission: RE | Admit: 2019-05-24 | Discharge: 2019-05-24 | Disposition: A | Discharge status: Home / Self Care | Payer: Medicare Other | Source: Ambulatory Visit | Attending: Cardiovascular Disease | Admitting: Cardiovascular Disease

## 2019-05-24 ENCOUNTER — Ambulatory Visit (HOSPITAL_COMMUNITY)
Admission: RE | Admit: 2019-05-24 | Discharge: 2019-05-24 | Disposition: A | Discharge status: Home / Self Care | Payer: Medicare Other | Source: Ambulatory Visit | Attending: Cardiovascular Disease | Admitting: Cardiovascular Disease

## 2019-05-24 ENCOUNTER — Encounter (HOSPITAL_COMMUNITY)
Admission: RE | Admit: 2019-05-24 | Discharge: 2019-05-24 | Disposition: A | Discharge status: Home / Self Care | Payer: Medicare Other | Source: Ambulatory Visit | Attending: Cardiovascular Disease | Admitting: Cardiovascular Disease

## 2019-05-24 ENCOUNTER — Encounter (HOSPITAL_COMMUNITY): Payer: Self-pay

## 2019-05-24 DIAGNOSIS — I498 Other specified cardiac arrhythmias: Secondary | ICD-10-CM | POA: Diagnosis not present

## 2019-05-24 DIAGNOSIS — I700 Atherosclerosis of aorta: Secondary | ICD-10-CM | POA: Insufficient documentation

## 2019-05-24 DIAGNOSIS — Z20822 Contact with and (suspected) exposure to covid-19: Secondary | ICD-10-CM | POA: Diagnosis not present

## 2019-05-24 DIAGNOSIS — I350 Nonrheumatic aortic (valve) stenosis: Secondary | ICD-10-CM | POA: Insufficient documentation

## 2019-05-24 DIAGNOSIS — Z01818 Encounter for other preprocedural examination: Secondary | ICD-10-CM | POA: Insufficient documentation

## 2019-05-24 DIAGNOSIS — I2510 Atherosclerotic heart disease of native coronary artery without angina pectoris: Secondary | ICD-10-CM | POA: Diagnosis not present

## 2019-05-24 DIAGNOSIS — I517 Cardiomegaly: Secondary | ICD-10-CM | POA: Diagnosis not present

## 2019-05-24 HISTORY — DX: Personal history of urinary calculi: Z87.442

## 2019-05-24 LAB — COMPREHENSIVE METABOLIC PANEL
ALT: 13 U/L (ref 0–44)
AST: 18 U/L (ref 15–41)
Albumin: 3.6 g/dL (ref 3.5–5.0)
Alkaline Phosphatase: 50 U/L (ref 38–126)
Anion gap: 10 (ref 5–15)
BUN: 18 mg/dL (ref 8–23)
CO2: 20 mmol/L — ABNORMAL LOW (ref 22–32)
Calcium: 8.7 mg/dL — ABNORMAL LOW (ref 8.9–10.3)
Chloride: 111 mmol/L (ref 98–111)
Creatinine, Ser: 1.03 mg/dL — ABNORMAL HIGH (ref 0.44–1.00)
GFR calc Af Amer: >60 mL/min (ref >60)
GFR calc non Af Amer: 54 mL/min — ABNORMAL LOW (ref >60)
Glucose, Bld: 93 mg/dL (ref 70–99)
Potassium: 3.6 mmol/L (ref 3.5–5.1)
Sodium: 141 mmol/L (ref 135–145)
Total Bilirubin: 0.3 mg/dL (ref 0.3–1.2)
Total Protein: 6.3 g/dL — ABNORMAL LOW (ref 6.5–8.1)

## 2019-05-24 LAB — PROTIME-INR
INR: 1 (ref 0.8–1.2)
Prothrombin Time: 13.1 seconds (ref 11.4–15.2)

## 2019-05-24 LAB — BRAIN NATRIURETIC PEPTIDE: B Natriuretic Peptide: 121.3 pg/mL — ABNORMAL HIGH (ref 0.0–100.0)

## 2019-05-24 LAB — BLOOD GAS, ARTERIAL
Acid-base deficit: 1.3 mmol/L (ref 0.0–2.0)
Bicarbonate: 22.4 mmol/L (ref 20.0–28.0)
FIO2: 21
O2 Saturation: 98.7 %
Patient temperature: 37
pCO2 arterial: 34.6 mmHg (ref 32.0–48.0)
pH, Arterial: 7.427 (ref 7.350–7.450)
pO2, Arterial: 116 mmHg — ABNORMAL HIGH (ref 83.0–108.0)

## 2019-05-24 LAB — TYPE AND SCREEN
ABO/RH(D): O POS
Antibody Screen: NEGATIVE

## 2019-05-24 LAB — URINALYSIS, ROUTINE W REFLEX MICROSCOPIC
Bilirubin Urine: NEGATIVE
Glucose, UA: NEGATIVE mg/dL
Hgb urine dipstick: NEGATIVE
Ketones, ur: NEGATIVE mg/dL
Leukocytes,Ua: NEGATIVE
Nitrite: NEGATIVE
Protein, ur: NEGATIVE mg/dL
Specific Gravity, Urine: 1.011 (ref 1.005–1.030)
pH: 5 (ref 5.0–8.0)

## 2019-05-24 LAB — CBC
HCT: 34.6 % — ABNORMAL LOW (ref 36.0–46.0)
Hemoglobin: 11.7 g/dL — ABNORMAL LOW (ref 12.0–15.0)
MCH: 31 pg (ref 26.0–34.0)
MCHC: 33.8 g/dL (ref 30.0–36.0)
MCV: 91.5 fL (ref 80.0–100.0)
Platelets: 179 10*3/uL (ref 150–400)
RBC: 3.78 MIL/uL — ABNORMAL LOW (ref 3.87–5.11)
RDW: 12.5 % (ref 11.5–15.5)
WBC: 8 10*3/uL (ref 4.0–10.5)
nRBC: 0 % (ref 0.0–0.2)

## 2019-05-24 LAB — HEMOGLOBIN A1C
Hgb A1c MFr Bld: 5 % (ref 4.8–5.6)
Mean Plasma Glucose: 96.8 mg/dL

## 2019-05-24 LAB — SURGICAL PCR SCREEN
MRSA, PCR: NEGATIVE
Staphylococcus aureus: NEGATIVE

## 2019-05-24 LAB — SARS CORONAVIRUS 2 (TAT 6-24 HRS): SARS Coronavirus 2: NEGATIVE

## 2019-05-24 LAB — ABO/RH: ABO/RH(D): O POS

## 2019-05-24 LAB — APTT: aPTT: 26 seconds (ref 24–36)

## 2019-05-24 IMAGING — CR DG CHEST 2V
1 series · 1 of 1 positions shown · non-contrast
Comparison: Radiograph [DATE], CT [DATE]

CLINICAL DATA: Preoperative exam for aortic valve replacement,
history of severe aortic stenosis

EXAM:
CHEST - 2 VIEW

[w chest pa]
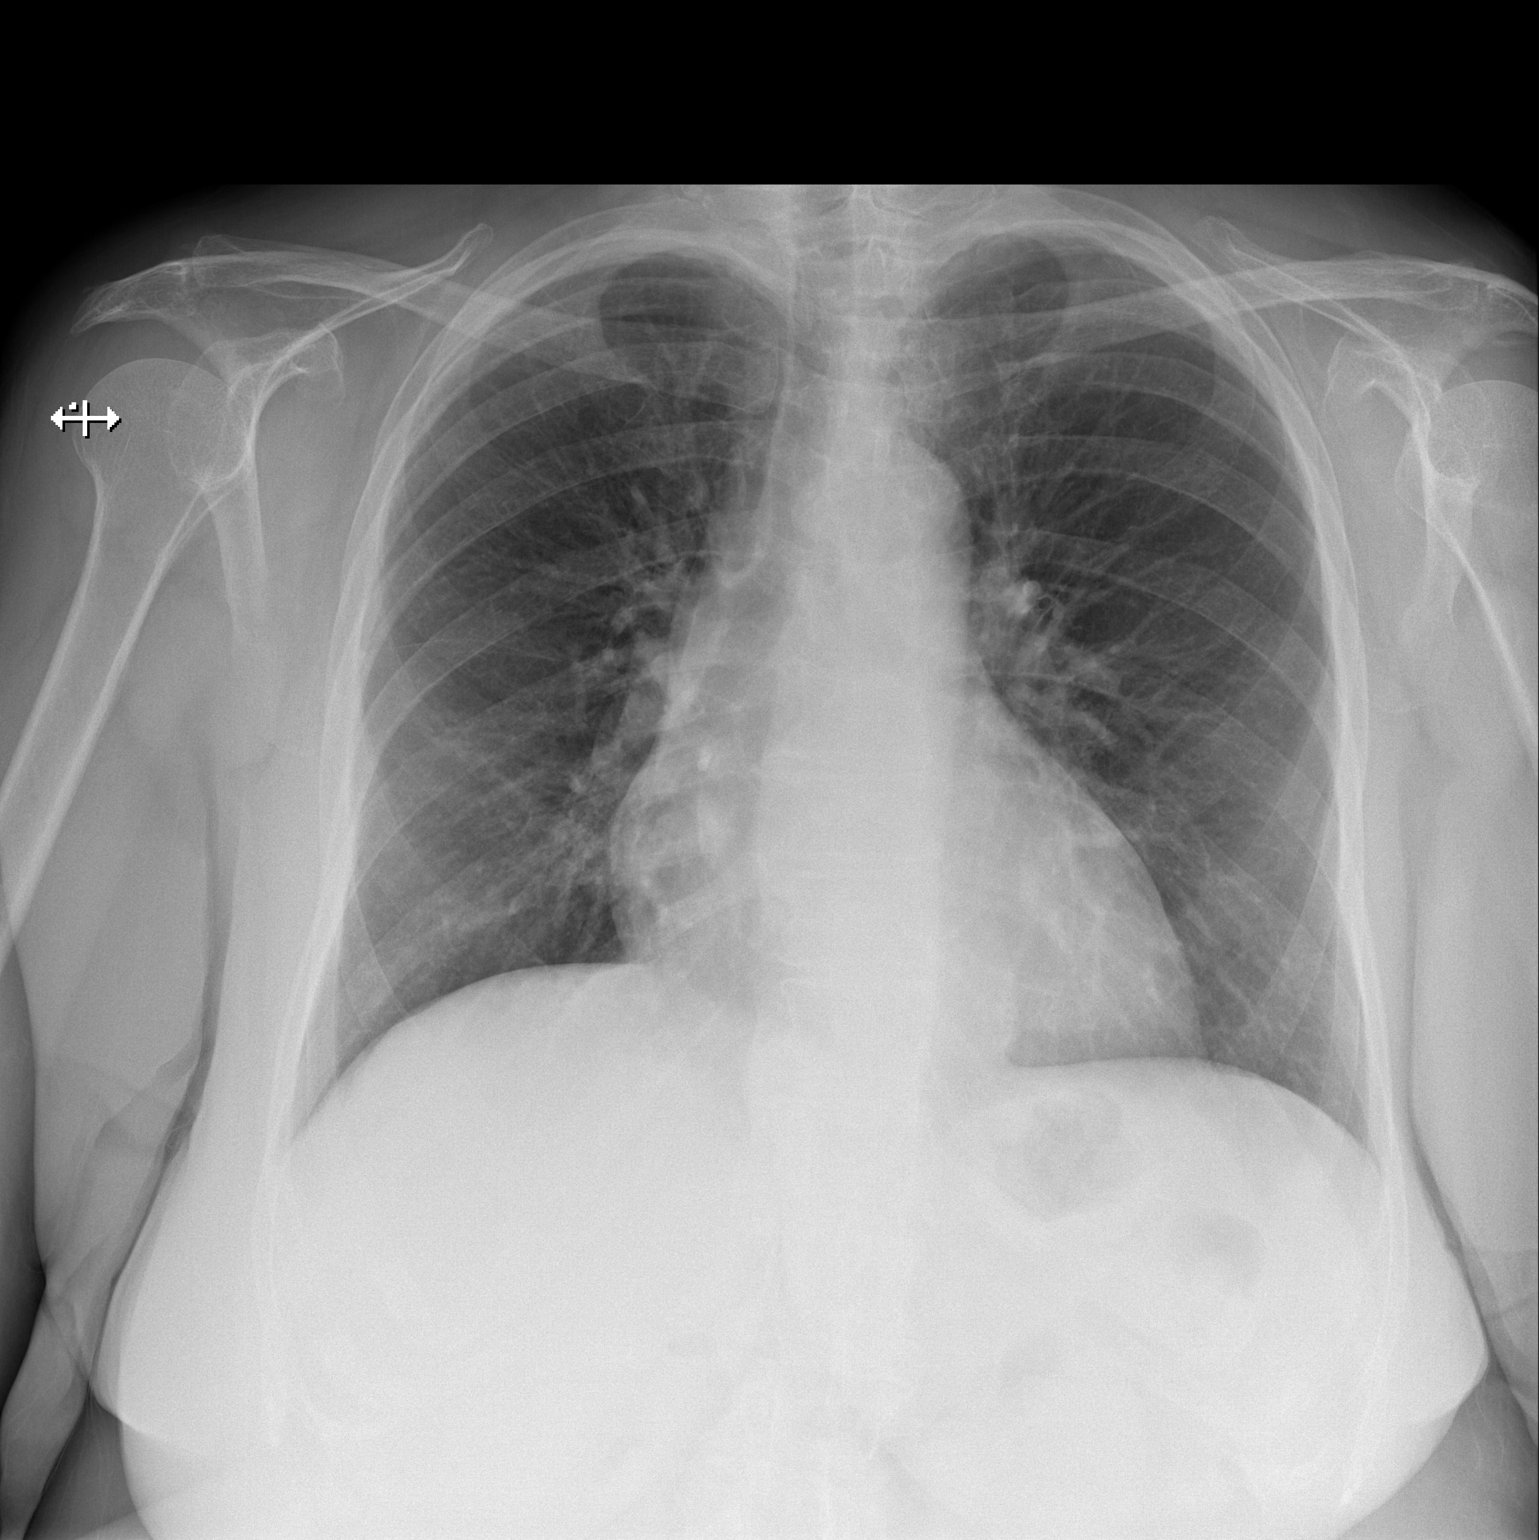

[1 of 1 positions shown; findings below may reference images not displayed]

FINDINGS: No consolidation, features of edema, pneumothorax, or effusion.
Double density sign along the right heart border compatible with the
left atrial enlargement better seen on comparison CT. Few coronary
artery calcifications are present. Atherosclerotic plaque within the
normal caliber aorta. No acute osseous or soft tissue abnormality.
Degenerative changes are present in the imaged spine and shoulders.
IMPRESSION: 1. No acute cardiopulmonary findings.
2. Left atrial enlargement.
3. Coronary artery calcifications the.
4.  Aortic Atherosclerosis ([0C]-[0C]).

## 2019-05-24 NOTE — Pre-Procedure Instructions (Addendum)
Elizabeth [name]  05/24/2019    Your procedure is scheduled on Tuesday, May 28, 2019 at 1:45 PM.   Report to [facility] Entrance "A" Admitting Office at 11:45 AM.   Call this number if you have problems the morning of surgery: [phone]   Questions prior to day of surgery, please call [phone] between 8 & 4 PM.   Remember:  Do not eat or drink after midnight Monday, 05/27/19.  Continue taking all current medications without change through the day before surgery.   On the morning of surgery do not take any medications.     Do not wear jewelry, make-up or nail polish.  Do not wear lotions, powders, perfumes or deodorant.  Do not shave 48 hours prior to surgery.    Do not bring valuables to the hospital.  [facility] is not responsible for any belongings or valuables.  Contacts, dentures or bridgework may not be worn into surgery.  Leave your suitcase in the car.  After surgery it may be brought to your room.  For patients admitted to the hospital, discharge time will be determined by your treatment team.  [facility] - Preparing for Surgery  Before surgery, you can play an important role.  Because skin is not sterile, your skin needs to be as free of germs as possible.  You can reduce the number of germs on you skin by washing with CHG (chlorahexidine gluconate) soap before surgery.  CHG is an antiseptic cleaner which kills germs and bonds with the skin to continue killing germs even after washing.  Oral Hygiene is also important in reducing the risk of infection.  Remember to brush your teeth with your regular toothpaste the morning of surgery.  Please DO NOT use if you have an allergy to CHG or antibacterial soaps.  If your skin becomes reddened/irritated stop using the CHG and inform your nurse when you arrive at Short Stay.  Do not shave (including legs and underarms) for at least 48 hours prior to the first CHG shower.  You may shave your face.  Please  follow these instructions carefully:   1.  Shower with CHG Soap the night before surgery and the morning of Surgery.  2.  If you choose to wash your hair, wash your hair first as usual with your normal shampoo.  3.  After you shampoo, rinse your hair and body thoroughly to remove the shampoo. 4.  Use CHG as you would any other liquid soap.  You can apply chg directly to the skin and wash gently with a      scrungie or washcloth.           5.  Apply the CHG Soap to your body ONLY FROM THE NECK DOWN.   Do not use on open wounds or open sores. Avoid contact with your eyes, ears, mouth and genitals (private parts).  Wash genitals (private parts) with your normal soap - do this prior to using CHG soap.  6.  Wash thoroughly, paying special attention to the area where your surgery will be performed.  7.  Thoroughly rinse your body with warm water from the neck down.  8.  DO NOT shower/wash with your normal soap after using and rinsing off the CHG Soap.  9.  Pat yourself dry with a clean towel.            10.  Wear clean pajamas.            11.  Place clean sheets on your bed the night of your first shower and do not sleep with pets.  Day of Surgery  Shower as above.  Do not apply any lotions/deodorants the morning of surgery.   Please wear clean clothes to the hospital. Remember to brush your teeth with toothpaste.   Please read over the fact sheets that you were given.

## 2019-05-24 NOTE — Pre-Procedure Instructions (Signed)
Ladon Garen [place]ams [place]  05/24/2019    Your procedure is scheduled on Tuesday, May 28, 2019 at 10:45 AM.   Report to [facility] Entrance "A" Admitting Office at 8:45 AM.   Call this number if you have problems the morning of surgery: [phone]   Questions prior to day of surgery, please call [phone] between 8 & 4 PM.   Remember:  Do not eat or drink after midnight Monday, 05/27/19.  Take these medicines the morning of surgery with A SIP OF WATER: NONE  Stop Ibuprofen as of today prior to surgery. Do not use other NSAIDS (Aleve, Naproxyn, etc), Aspirin products (BC Powders, Goody's, etc), Multivitamins, Fish oil or Herbal medications prior to surgery    Do not wear jewelry, make-up or nail polish.  Do not wear lotions, powders, perfumes or deodorant.  Do not shave 48 hours prior to surgery.    Do not bring valuables to the hospital.  [facility] is not responsible for any belongings or valuables.  Contacts, dentures or bridgework may not be worn into surgery.  Leave your suitcase in the car.  After surgery it may be brought to your room.  For patients admitted to the hospital, discharge time will be determined by your treatment team.  [facility] - Preparing for Surgery  Before surgery, you can play an important role.  Because skin is not sterile, your skin needs to be as free of germs as possible.  You can reduce the number of germs on you skin by washing with CHG (chlorahexidine gluconate) soap before surgery.  CHG is an antiseptic cleaner which kills germs and bonds with the skin to continue killing germs even after washing.  Oral Hygiene is also important in reducing the risk of infection.  Remember to brush your teeth with your regular toothpaste the morning of surgery.  Please DO NOT use if you have an allergy to CHG or antibacterial soaps.  If your skin becomes reddened/irritated stop using the CHG and inform your nurse when you arrive at Short Stay.  Do not  shave (including legs and underarms) for at least 48 hours prior to the first CHG shower.  You may shave your face.  Please follow these instructions carefully:   1.  Shower with CHG Soap the night before surgery and the morning of Surgery.  2.  If you choose to wash your hair, wash your hair first as usual with your normal shampoo.  3.  After you shampoo, rinse your hair and body thoroughly to remove the shampoo. 4.  Use CHG as you would any other liquid soap.  You can apply chg directly to the skin and wash gently with a      scrungie or washcloth.           5.  Apply the CHG Soap to your body ONLY FROM THE NECK DOWN.   Do not use on open wounds or open sores. Avoid contact with your eyes, ears, mouth and genitals (private parts).  Wash genitals (private parts) with your normal soap - do this prior to using CHG soap.  6.  Wash thoroughly, paying special attention to the area where your surgery will be performed.  7.  Thoroughly rinse your body with warm water from the neck down.  8.  DO NOT shower/wash with your normal soap after using and rinsing off the CHG Soap.  9.  Pat yourself dry with a clean towel.  10.  Wear clean pajamas.            11.  Place clean sheets on your bed the night of your first shower and do not sleep with pets.  Day of Surgery  Shower as above.  Do not apply any lotions/deodorants the morning of surgery.   Please wear clean clothes to the hospital. Remember to brush your teeth with toothpaste.   Please read over the fact sheets that you were given.

## 2019-05-24 NOTE — Progress Notes (Signed)
PCP - Marco Collie, MD Cardiologist - Shirlee More, MD  PPM/ICD - Denies  Chest x-ray - 05/24/19 EKG - 05/24/19 Stress Test - 09/2018, Per patient, done at [facility], Records Requested.  ECHO - 12/31/18 Cardiac Cath - 03/11/19  Sleep Study - Denies  Patient denies being a diabetic.   Blood Thinner Instructions: N/A Aspirin Instructions: N/A  ERAS Protcol - N/A PRE-SURGERY Ensure or G2- N/A   COVID TEST- 05/24/19   Anesthesia review: Yes, cardiac hx.   Patient denies shortness of breath, fever, cough and chest pain at PAT appointment   All instructions explained to the patient, with a verbal understanding of the material. Patient agrees to go over the instructions while at home for a better understanding. Patient also instructed to self quarantine after being tested for COVID-19. The opportunity to ask questions was provided.

## 2019-05-27 MED ORDER — DEXMEDETOMIDINE HCL IN NACL 400 MCG/100ML IV SOLN
0.1000 ug/kg/h | INTRAVENOUS | Status: AC
  Administered 2019-05-28: 55.8 ug via INTRAVENOUS
  Filled 2019-05-27: qty 100

## 2019-05-27 MED ORDER — SODIUM CHLORIDE 0.9 % IV SOLN
INTRAVENOUS | Status: DC
  Filled 2019-05-27: qty 30

## 2019-05-27 MED ORDER — POTASSIUM CHLORIDE 2 MEQ/ML IV SOLN
80.0000 meq | INTRAVENOUS | Status: DC
  Filled 2019-05-27: qty 40

## 2019-05-27 MED ORDER — MAGNESIUM SULFATE 50 % IJ SOLN
40.0000 meq | INTRAMUSCULAR | Status: DC
  Filled 2019-05-27: qty 9.85

## 2019-05-27 MED ORDER — LEVOFLOXACIN IN D5W 500 MG/100ML IV SOLN
500.0000 mg | INTRAVENOUS | Status: AC
  Administered 2019-05-28: 500 mg via INTRAVENOUS
  Filled 2019-05-27: qty 100

## 2019-05-27 MED ORDER — VANCOMYCIN HCL 1250 MG/250ML IV SOLN
1250.0000 mg | INTRAVENOUS | Status: AC
  Administered 2019-05-28: 1250 mg via INTRAVENOUS
  Filled 2019-05-27: qty 250

## 2019-05-27 MED ORDER — NOREPINEPHRINE 4 MG/250ML-% IV SOLN
0.0000 ug/min | INTRAVENOUS | Status: AC
  Administered 2019-05-28: 1 ug/min via INTRAVENOUS
  Filled 2019-05-27: qty 250

## 2019-05-27 NOTE — H&P (Signed)
[address]       [place] [address]             [phone]      Cardiothoracic Surgery Admission History and Physical   Referring Provider is Richardo Priest, MD  Primary Cardiologist is No primary care provider on file.  PCP is Marco Collie, MD      Chief Complaint  Patient presents with  . Aortic Stenosis       HPI:  The patient is a 74 year old woman with a history of hypertension, stage II chronic kidney disease, and aortic stenosis. She was admitted with congestive heart failure in June 2020 and had an echocardiogram performed which showed an ejection fraction of 55 to 60%. This reportedly showed a bicuspid aortic valve with severe thickening and moderate calcification and a peak velocity of 4.2 m/s with an aortic valve area of 0.6 to 0.7 cm consistent with severe aortic stenosis. She had a follow-up echocardiogram on 12/31/2018 which showed an aortic valve mean gradient of 49 mmHg with a peak gradient of 69 mmHg and an aortic valve area by VTI measuring 0.58 cm. Left ventricular ejection fraction was 55 to 60%. She was seen by Dr. Bettina Gavia in November 2020 and referred for cardiac catheterization which was performed on 03/03/2019. This showed nonobstructive coronary disease and a mean gradient of 43 mmHg across the aortic valve consistent with severe aortic stenosis. Right heart pressures were within normal limits. She was scheduled for TAVR work-up and plan potential surgery in mid January but had to delay her appointment so that she could have a noninvasive knee procedure. She decided that she did not want to cancel the knee procedure due to the amount of pain that she was having. Then she became very ill with suspected COVID-19 infection along with multiple other family members. She lost over 25 pounds and was very weak and fatigued.  Her husband recently had coronary bypass graft surgery and aortic valve replacement by Dr. Darcey Nora. They have been married 45  years and have never spent a night apart. She thought that they were both in good condition until recently. 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 She reports progressive exertional shortness of breath over the past several weeks. She tried to visit her husband in the hospital but could not walk down the hall without stopping multiple times. She denies any dizziness or syncope. She has had no chest pain. She denies peripheral edema      Past Medical History:  Diagnosis Date  . Anxiety    extreme  . Benign hypertension with CKD (chronic kidney disease), stage II   . Claustrophobia   . GERD (gastroesophageal reflux disease)   . Hyperuricemia   . Lumbar stenosis   . Osteoarthrosis   . Severe aortic stenosis   . Vitamin D deficiency         Past Surgical History:  Procedure Laterality Date  . ABDOMINAL HYSTERECTOMY    . APPENDECTOMY    . GALLBLADDER SURGERY    . IR RADIOLOGY PERIPHERAL GUIDED IV START  05/14/2019  . IR US GUIDE VASC ACCESS RIGHT  05/14/2019  . LUMBAR LAMINECTOMY/DECOMPRESSION MICRODISCECTOMY N/A 06/12/2017   Procedure: LAMINECTOMY AND FORAMINOTOMY LUMBAR THREE- LUMBAR FOUR, LUMBAR FOUR- LUMBAR FIVE; Surgeon: Newman Pies, MD; Location: [facility]; Service: Neurosurgery; Laterality: N/A;  . RIGHT/LEFT HEART CATH AND CORONARY ANGIOGRAPHY N/A 03/11/2019  Procedure: RIGHT/LEFT HEART CATH AND CORONARY ANGIOGRAPHY; Surgeon: Sherren Mocha, MD; Location: [facility]; Service: Cardiovascular; Laterality: N/A;        Family History  Problem Relation Age of Onset  . Alzheimer's disease Mother   . Aneurysm Father   . Stomach cancer Brother    Social History        Socioeconomic History  . Marital status: Married    Spouse name: Not on file  . Number of children: 2  . Years of education: 15  . Highest education level: Not on file  Occupational History  . Occupation: child care  Tobacco Use  . Smoking status:  Never Smoker  . Smokeless tobacco: Never Used  Substance and Sexual Activity  . Alcohol use: No  . Drug use: No  . Sexual activity: Not on file  Other Topics Concern  . Not on file  Social History Narrative   Lives with husband in a one story home. Has 2 children. 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 Education: high school.    Social Determinants of Health      Financial Resource Strain:   . Difficulty of Paying Living Expenses: Not on file  Food Insecurity:   . Worried About Charity fundraiser in the Last Year: Not on file  . Ran Out of Food in the Last Year: Not on file  Transportation Needs:   . Lack of Transportation (Medical): Not on file  . Lack of Transportation (Non-Medical): Not on file  Physical Activity:   . Days of Exercise per Week: Not on file  . Minutes of Exercise per Session: Not on file  Stress:   . Feeling of Stress : Not on file  Social Connections:   . Frequency of Communication with Friends and Family: Not on file  . Frequency of Social Gatherings with Friends and Family: Not on file  . Attends Religious Services: Not on file  . Active Member of Clubs or Organizations: Not on file  . Attends Archivist Meetings: Not on file  . Marital Status: Not on file  Intimate Partner Violence:   . Fear of Current or Ex-Partner: Not on file  . Emotionally Abused: Not on file  . Physically Abused: Not on file  . Sexually Abused: Not on file         Current Outpatient Medications  Medication Sig Dispense Refill  . amLODipine (NORVASC) 5 MG tablet Take 1 tablet (5 mg total) by mouth daily. 90 tablet 0  . carvedilol (COREG) 6.25 MG tablet Take 6.25 mg by mouth 2 (two) times daily with a meal.     . fexofenadine (ALLEGRA) 180 MG tablet SMARTSIG:1 Tablet(s) By Mouth As Needed    . furosemide (LASIX) 20 MG tablet Take 1 tablet (20 mg total) by mouth daily. 90 tablet 0  . lisinopril (ZESTRIL) 10 MG tablet Take 10 mg by mouth daily.     Marland Kitchen omeprazole (PRILOSEC) 40  MG capsule Take 40 mg by mouth daily as needed (acid reflux).     . tamoxifen (NOLVADEX) 20 MG tablet TAKE 1 TABLET(20 MG) BY MOUTH DAILY 90 tablet 3   No current facility-administered medications for this visit.        Allergies  Allergen Reactions  . Codeine Nausea And Vomiting  . Lipitor [Atorvastatin] Itching and Other (See Comments)    Redness and itching all over body  . Penicillins Hives    Has patient had a PCN reaction causing immediate  rash, facial/tongue/throat swelling, SOB or lightheadedness with hypotension: Yes  Has patient had a PCN reaction causing severe rash involving mucus membranes or skin necrosis: Yes  Has patient had a PCN reaction that required hospitalization: No  Has patient had a PCN reaction occurring within the last 10 years: No  If all of the above answers are "NO", then may proceed with Cephalosporin use.   . Prednisone Other (See Comments)    Red and hot all over    Review of Systems:   General: normal appetite, + decreased energy, no weight gain, no weight loss, no fever  Cardiac: no chest pain with exertion, no chest pain at rest, +SOB with mild exertion, no resting SOB, no PND, no orthopnea, no palpitations, no arrhythmia, no atrial fibrillation, no LE edema, no dizzy spells, no syncope  Respiratory: + exertional shortness of breath, no home oxygen, no productive cough, no dry cough, no bronchitis, no wheezing, no hemoptysis, no asthma, no pain with inspiration or cough, no sleep apnea, no CPAP at night  GI: no difficulty swallowing, no reflux, no frequent heartburn, no hiatal hernia, no abdominal pain, no constipation, no diarrhea, no hematochezia, no hematemesis, no melena  GU: no dysuria, no frequency, no urinary tract infection, no hematuria, no kidney stones, + kidney disease  Vascular: no pain suggestive of claudication, no pain in feet, no leg cramps, no varicose veins, no DVT, no non-healing foot ulcer  Neuro: no stroke, no TIA's, no seizures,  no headaches, no temporary blindness one eye, no slurred speech, no peripheral neuropathy, no chronic pain, no instability of gait, no memory/cognitive dysfunction  Musculoskeletal: + arthritis, no joint swelling, no myalgias, + difficulty walking, + reduced mobility  Skin: no rash, no itching, no skin infections, no pressure sores or ulcerations  Psych: no anxiety, no depression, no nervousness, no unusual recent stress  Eyes: no blurry vision, no floaters, no recent vision changes, + wears glasses or contacts  ENT: no hearing loss, no loose or painful teeth, no dentures, last saw dentist last year  Hematologic: no easy bruising, no abnormal bleeding, no clotting disorder, no frequent epistaxis  Endocrine: no diabetes, does not check CBG's at home    Physical Exam:   BP (!) 142/70 (BP Location: Left Arm, Patient Position: Sitting, Cuff Size: Large)  Pulse (!) 59  Temp 97.7 F (36.5 C) (Temporal)  Resp 20  Ht 5\' 5"  (1.651 m)  Wt 240 lb (108.9 kg)  SpO2 98% Comment: RA  BMI 39.94 kg/m  General: Elderly, well-appearing  HEENT: Unremarkable, NCAT, PERLA, EOMI  Neck: no JVD, no bruits, no adenopathy  Chest: clear to auscultation, symmetrical breath sounds, no wheezes, no rhonchi  CV: RRR, grade lll/VI crescendo/decrescendo murmur heard best at RSB, no diastolic murmur  Abdomen: soft, non-tender, no masses  Extremities: warm, well-perfused, pulses palpable at ankle, no LE edema  Rectal/GU Deferred  Neuro: Grossly non-focal and symmetrical throughout  Skin: Clean and dry, no rashes, no breakdown    Diagnostic Tests:   ECHOCARDIOGRAM REPORT     Patient Name: KENDELL STARIN Diep Date of Exam: 12/31/2018  Medical Rec #: [id number] Height: 65.5 in  Accession #: [id number] Weight: 256.0 lb  Date of Birth: May 09, 1945 BSA: 2.21 m  Patient Age: 21 years BP: 134/80 mmHg  Patient Gender: F HR: 55 bpm.  Exam Location: [facility]   Procedure: 2D Echo, Cardiac Doppler and Color Doppler     Indications: Aortic Stenosis, chronic diastolic heart failure   History: Patient  has no prior history of Echocardiogram  examinations.  CHF; Aortic Valve Disease Signs/Symptoms:Murmur Risk  Factors:Hypertension.   Sonographer: Cardell Peach RDCS (AE)  Referring Phys: [id number] Walker    1. Left ventricular ejection fraction, by visual estimation, is 55 to  60%. The left ventricle has normal function. Normal left ventricular size.  Left ventricular septal wall thickness was mildly increased. Mildly  increased left ventricular posterior  wall thickness. There is mildly increased left ventricular hypertrophy.  2. Elevated mean left atrial pressure.  3. Left ventricular diastolic Doppler parameters are consistent with  pseudonormalization pattern of LV diastolic filling.  4. Global right ventricle has normal systolic function.The right  ventricular size is normal. No increase in right ventricular wall  thickness.  5. Left atrial size was normal.  6. Right atrial size was normal.  7. Mild to moderate mitral annular calcification.  8. The mitral valve is normal in structure. No evidence of mitral valve  regurgitation. No evidence of mitral stenosis.  9. The tricuspid valve is normal in structure. Tricuspid valve  regurgitation is mild.  10. The aortic valve is bicuspid Aortic valve regurgitation is mild by  color flow Doppler. Severe aortic valve stenosis.  11. There is Moderate calcification of the aortic valve.  12. There is Severely thickening of the aortic valve.  13. The pulmonic valve was normal in structure. Pulmonic valve  regurgitation is not visualized by color flow Doppler.  14. There is mild to moderate dilatation of the ascending aorta measuring  39 mm.  15. Normal pulmonary artery systolic pressure.  16. The inferior vena cava is normal in size with greater than 50%  respiratory variability, suggesting right atrial pressure of 3 mmHg.    FINDINGS  Left Ventricle: Left ventricular ejection fraction, by visual estimation,  is 55 to 60%. The left ventricle has normal function. No evidence of left  ventricular regional wall motion abnormalities. Left ventricular septal  wall thickness was mildly  increased. Mildly increased left ventricular posterior wall thickness.  There is mildly increased left ventricular hypertrophy. Normal left  ventricular size. Spectral Doppler shows Left ventricular diastolic  Doppler parameters are consistent with  pseudonormalization pattern of LV diastolic filling. Elevated mean left  atrial pressure.   Right Ventricle: The right ventricular size is normal. No increase in  right ventricular wall thickness. Global RV systolic function is has  normal systolic function. The tricuspid regurgitant velocity is 1.90 m/s,  and with an assumed right atrial pressure  of 10 mmHg, the estimated right ventricular systolic pressure is normal  at 24.4 mmHg.   Left Atrium: Left atrial size was normal in size.   Right Atrium: Right atrial size was normal in size   Pericardium: There is no evidence of pericardial effusion.   Mitral Valve: The mitral valve is normal in structure. Mild to moderate  mitral annular calcification. No evidence of mitral valve stenosis by  observation. No evidence of mitral valve regurgitation.   Tricuspid Valve: The tricuspid valve is normal in structure. Tricuspid  valve regurgitation is mild by color flow Doppler.   Aortic Valve: The aortic valve is bicuspid. There is Severely thickening  and Moderate calcification of the aortic valve. Aortic valve regurgitation  is mild by color flow Doppler. Aortic regurgitation PHT measures 507 msec.  Severe aortic stenosis is  present. There is Severely thickening of the aortic valve. Moderate  calcification. Aortic valve mean gradient measures 49.2 mmHg. Aortic valve  peak gradient  measures 69.3 mmHg. Aortic valve area, by VTI  measures 0.58  cm. The peak aortic velocity was  obtained from the apical view.   Pulmonic Valve: The pulmonic valve was normal in structure. Pulmonic valve  regurgitation is not visualized by color flow Doppler. No evidence of  pulmonic stenosis.   Aorta: The aortic root is normal in size and structure. There is mild to  moderate dilatation of the ascending aorta measuring 39 mm.   Venous: A systolic blunting flow pattern is recorded from the right upper  pulmonary vein. The inferior vena cava is normal in size with greater than  50% respiratory variability, suggesting right atrial pressure of 3 mmHg.   IAS/Shunts: No atrial level shunt detected by color flow Doppler. No  ventricular septal defect is seen or detected. There is no evidence of an  atrial septal defect.     LEFT VENTRICLE  PLAX 2D  LVIDd: 5.65 cm Diastology  LVIDs: 3.80 cm LV e' lateral: 7.40 cm/s  LV PW: 1.21 cm LV E/e' lateral: 18.8  LV IVS: 1.26 cm LV e' medial: 5.00 cm/s  LVOT diam: 1.70 cm LV E/e' medial: 27.8  LV SV: 95 ml  LV SV Index: 39.92  LVOT Area: 2.27 cm    RIGHT VENTRICLE IVC  RV Basal diam: 2.97 cm IVC diam: 1.64 cm  RV S prime: 12.50 cm/s  TAPSE (M-mode): 2.5 cm   LEFT ATRIUM Index RIGHT ATRIUM Index  LA diam: 5.30 cm 2.40 cm/m RA Area: 15.90 cm  LA Vol (A2C): 86.1 ml 38.97 ml/m RA Volume: 37.00 ml 16.75 ml/m  LA Vol (A4C): 56.4 ml 25.53 ml/m  LA Biplane Vol: 69.9 ml 31.64 ml/m  AORTIC VALVE  AV Area (Vmax): 0.63 cm  AV Area (Vmean): 0.56 cm  AV Area (VTI): 0.58 cm  AV Vmax: 416.25 cm/s  AV Vmean: 337.750 cm/s  AV VTI: 1.210 m  AV Peak Grad: 69.3 mmHg  AV Mean Grad: 49.2 mmHg  LVOT Vmax: 116.00 cm/s  LVOT Vmean: 82.800 cm/s  LVOT VTI: 0.309 m  LVOT/AV VTI ratio: 0.26  AI PHT: 507 msec   AORTA  Ao Root diam: 2.80 cm  Ao Asc diam: 3.90 cm   MITRAL VALVE TRICUSPID VALVE  MV Area (PHT): 3.42 cm TR Peak grad: 14.4 mmHg  MV PHT: 64.38 msec TR Vmax: 190.00 cm/s  MV  Decel Time: 222 msec  MV E velocity: 139.00 cm/s 103 cm/s SHUNTS  MV A velocity: 96.70 cm/s 70.3 cm/s Systemic VTI: 0.31 m  MV E/A ratio: 1.44 1.5 Systemic Diam: 1.70 cm    Shirlee More MD  Electronically signed by Shirlee More MD  Signature Date/Time: 12/31/2018/1:01:15 PM     Panel Physicians Referring Physician Case Authorizing Physician  Sherren Mocha, MD (Primary)       Procedures  RIGHT/LEFT HEART CATH AND CORONARY ANGIOGRAPHY     Conclusion  1. Severe aortic stenosis with bulky calcification of the aortic valve leaflets, restricted mobility, and a mean transvalvular gradient of 43 mmHg.  2. Normal right heart pressures  3. Nonobstructive CAD, left-dominant, appropriate for medical therapy of coronary disease  Recommend: multidisciplinary heart team evaluation for aortic valve replacement           Indications  Severe aortic stenosis [I35.0 (ICD-10-CM)]     Procedural Details  Technical Details INDICATION: Severe, symptomatic aortic stenosis  PROCEDURAL DETAILS: The right wrist was then prepped, draped, and anesthetized with 1% lidocaine. Using direct ultrasound guidance, antecubital venous access is  obtained and a 4/5 French slender sheath is introduced. Using ultrasound guidance technique a 5/6 French Slender sheath was placed in the right radial artery. Ultrasound images are captured and stored in the patient's chart. Intra-arterial verapamil was administered through the radial artery sheath. IV heparin was administered after a JR4 catheter was advanced into the central aorta. A Swan-Ganz catheter was used for the right heart catheterization. Standard protocol was followed for recording of right heart pressures and sampling of oxygen saturations. Fick cardiac output was calculated. Standard Judkins catheters were used for selective coronary angiography. LV pressure is recorded and an aortic valve pullback is performed. The patient has left dominant coronary anatomy.  The RCA could not be selectively cannulated. An aortic root angiogram was performed via power injector. There were no immediate procedural complications. The patient was transferred to the post catheterization recovery area for further monitoring.    Estimated blood loss <50 mL.   During this procedure medications were administered to achieve and maintain moderate conscious sedation while the patient's heart rate, blood pressure, and oxygen saturation were continuously monitored and I was present face-to-face 100% of this time.     Medications  (Filter: Administrations occurring from 03/11/19 0840 to 03/11/19 0959)  Continuous medications are totaled by the amount administered until 03/11/19 0959.  Heparin (Porcine) in NaCl 1000-0.9 UT/500ML-% SOLN (mL)  Total volume: 1,000 mL  Date/Time   Rate/Dose/Volume Action  03/11/19 0855  500 mL Given  0856  500 mL Given  lidocaine (PF) (XYLOCAINE) 1 % injection (mL)  Total volume: 4 mL  Date/Time   Rate/Dose/Volume Action  03/11/19 0905  2 mL Given  0913  2 mL Given  midazolam (VERSED) injection (mg)  Total dose: 4 mg  Date/Time   Rate/Dose/Volume Action  03/11/19 0901  2 mg Given  0912  1 mg Given  0936  1 mg Given  fentaNYL (SUBLIMAZE) injection (mcg)  Total dose: 75 mcg  Date/Time   Rate/Dose/Volume Action  03/11/19 0901  25 mcg Given  0913  25 mcg Given  0936  25 mcg Given  Radial Cocktail/Verapamil only (mL)  Total volume: 10 mL  Date/Time   Rate/Dose/Volume Action  03/11/19 0915  10 mL Given  heparin injection (Units)  Total dose: 5,000 Units  Date/Time   Rate/Dose/Volume Action  03/11/19 0927  5,000 Units Given  iohexol (OMNIPAQUE) 350 MG/ML injection (mL)  Total volume: 95 mL  Date/Time   Rate/Dose/Volume Action  03/11/19 0944  95 mL Given  fentaNYL (SUBLIMAZE) injection (mcg)  Total dose: 25 mcg  Date/Time   Rate/Dose/Volume Action  03/11/19 0958  25 mcg Given     Sedation Time  Sedation Time Physician-1:  41 minutes 16 seconds        Contrast  Medication Name Total Dose  iohexol (OMNIPAQUE) 350 MG/ML injection 95 mL     Radiation/Fluoro  Fluoro time: 9.2 (min)  DAP: 25935 (mGycm2)  Cumulative Air Kerma: 386 (mGy)        Coronary Findings  Diagnostic  Dominance: Left  Left Anterior Descending  Prox LAD to Mid LAD lesion 40% stenosed  Prox LAD to Mid LAD lesion is 40% stenosed. There is diffuse nonobstructive mid LAD stenosis   Left Circumflex  The vessel exhibits minimal luminal irregularities. Large, dominant circumflex with minimal irregularity, no significant stenosis.   Right Coronary Artery  Not selectively injected. Appears to be diminutive.  Intervention  No interventions have been documented.  Left Heart  Aortic Valve There is severe aortic valve stenosis. The aortic valve is calcified. There is restricted aortic valve motion. Mean transvalvular gradient 43 mmHg, peak to peak gradient 56 mmHg. There is bulky calcification of the aortic valve leaflets with severely reduced mobility.     Coronary Diagrams  Diagnostic  Dominance: Left  &&&&&  Intervention       Implants  No implant documentation for this case.      Syngo Images Link to Procedure Log  Show images for CARDIAC CATHETERIZATION Procedure Log     Images on Long Term Storage   Show images for [place], [facility]            Hemo Data    Most Recent Value  Fick Cardiac Output 8.6 L/min  Fick Cardiac Output Index 3.97 (L/min)/BSA  Aortic Mean Gradient 43.3 mmHg  Aortic Peak Gradient 56 mmHg  Aortic Valve Area 1.59  Aortic Value Area Index 0.74 cm2/BSA  RA A Wave 9 mmHg  RA V Wave 7 mmHg  RA Mean 5 mmHg  RV Systolic Pressure 30 mmHg  RV Diastolic Pressure 2 mmHg  RV EDP 8 mmHg  PA Systolic Pressure 31 mmHg  PA Diastolic Pressure 8 mmHg  PA Mean 19 mmHg  PW A Wave 16 mmHg  PW V Wave 17 mmHg  PW Mean 13 mmHg  AO Systolic Pressure [id number] mmHg   AO Diastolic Pressure 55 mmHg  AO Mean 81 mmHg  LV Systolic Pressure [id number] mmHg  LV Diastolic Pressure 7 mmHg  LV EDP 13 mmHg  AOp Systolic Pressure [id number] mmHg  AOp Diastolic Pressure 56 mmHg  AOp Mean Pressure 82 mmHg  LVp Systolic Pressure 0000000 mmHg  LVp Diastolic Pressure 6 mmHg  LVp EDP Pressure 13 mmHg  QP/QS 1  TPVR Index 4.78 HRUI  TSVR Index 20.38 HRUI  PVR SVR Ratio 0.08  TPVR/TSVR Ratio 0.23     ADDENDUM REPORT: 05/14/2019 17:18  CLINICAL DATA: 52 -year-old female with severe aortic stenosis  being evaluated for a TAVR procedure.  EXAM:  Cardiac TAVR CT  TECHNIQUE:  The patient was scanned on a [employer]. A 120 kV  retrospective scan was triggered in the descending thoracic aorta at  111 HU's. Gantry rotation speed was 250 msecs and collimation was .6  mm. No beta blockade or nitro were given. The 3D data set was  reconstructed in 5% intervals of the R-R cycle. Systolic and  diastolic phases were analyzed on a dedicated work station using  MPR, MIP and VRT modes. The patient received 80 cc of contrast.  FINDINGS:  Aortic Root:  Aortic valve: Tricuspid  Aortic valve calcium score: 2715  Aortic annulus:  Diameter: 93mm x 9mm  Perimeter: 80mm  Area: 423 mm^2  Calcifications: No calcifications  Coronary height: Min Left -44mm , Max Left -38mm ; Min Right - 21mm  Sinotubular height: Left cusp -55mm ; Right cusp - 57mm; Noncoronary  cusp - 18mm  LVOT (as measured 3 mm below the annulus):  Diameter: 86mm x 16mm  Area: 361mm^2  Calcifications: Moderate LVOT calcification located 31mm inferior to  annulus, beneath noncoronary cusp  Aortic sinus width: Left cusp - 66mm; Right cusp - 37mm; Noncoronary  cusp -65mm;  Sinotubular junction width: 85mm x 51mm  Optimum Fluoroscopic Angle for Delivery: LAO 3 CRA 21  Cardiac:  Right atrium: Normal size  Right ventricle: Mildly dilated  Pulmonary arteries: Mildly dilated, measures 3mm in main PA  Pulmonary  veins: Normal configuration  Left atrium: Moderately dilated  Left ventricle: Moderately dilated  Pericardium: Normal thickness  Coronary arteries: Calcium score 494 (88th percentile)  IMPRESSION:  1. Tricuspid aortic valve, severely calcified (calcium score 2715)  2. Aortic annulus measures 56mm x 37mm in diameter with area 423  mm^2 and perimeter 75mm. No annular calcifications. Annular  measurements suitable for delivery of 62mm Edwards-Sapien 3 valve  3. Moderate LVOT calcification located 11mm inferior to annulus,  beneath noncoronary cusp  4. Sufficient coronary to annulus distance, measures 20mm from left  main to annulus, and 10mm from RCA to annulus  5. Optimum Fluoroscopic Angle for Delivery (centered on right  coronary cusp): LAO 3 CRA 21  6. Coronary calcium score 494 (88th percentile)  Electronically Signed  By: Oswaldo Milian MD  On: 05/14/2019 17:18   Addended by Donato Heinz, MD on 05/14/2019 5:21 PM  Study Result   EXAM:  OVER-READ INTERPRETATION CT CHEST  The following report is an over-read performed by radiologist Dr.  Vinnie Langton of [facility], [place] on 05/14/2019. This  over-read does not include interpretation of cardiac or coronary  anatomy or pathology. The coronary calcium score/coronary CTA  interpretation by the cardiologist is attached.  COMPARISON: None.  FINDINGS:  Extracardiac findings will be described separately under dictation  for contemporaneously obtained CTA chest, abdomen and pelvis.  IMPRESSION:  Please see separate dictation for contemporaneously obtained CTA  chest, abdomen and pelvis 05/14/2019 for full description of  relevant extracardiac findings.  Electronically Signed:  By: Vinnie Langton M.D.  On: 05/14/2019 10:56  CLINICAL DATA: 74 year old female with history of severe aortic  stenosis. Preprocedural study prior to potential transcatheter  aortic valve replacement (TAVR) procedure.  EXAM:  CT  ANGIOGRAPHY CHEST, ABDOMEN AND PELVIS  TECHNIQUE:  Multidetector CT imaging through the chest, abdomen and pelvis was  performed using the standard protocol during bolus administration of  intravenous contrast. Multiplanar reconstructed images and MIPs were  obtained and reviewed to evaluate the vascular anatomy.  CONTRAST: 171mL OMNIPAQUE IOHEXOL 350 MG/ML SOLN  COMPARISON: None.  FINDINGS:  CTA CHEST FINDINGS  Cardiovascular: Heart size is mildly enlarged. There is no  significant pericardial fluid, thickening or pericardial  calcification. There is aortic atherosclerosis, as well as  atherosclerosis of the great vessels of the mediastinum and the  coronary arteries, including calcified atherosclerotic plaque in the  left main, left anterior descending, left circumflex and right  coronary arteries. Severe thickening and calcification of the aortic  valve. Calcifications of the mitral annulus. Dilatation of the  pulmonic trunk (3.7 cm in diameter).  Mediastinum/Lymph Nodes: No pathologically enlarged mediastinal or  hilar lymph nodes. Esophagus is unremarkable in appearance. No  axillary lymphadenopathy.  Lungs/Pleura: No acute consolidative airspace disease. No pleural  effusions. A few scattered tiny pulmonary nodules are noted in the  lungs bilaterally measuring 5 mm or less in size. No other larger  more suspicious appearing pulmonary nodules or masses are noted.  Musculoskeletal/Soft Tissues: There are no aggressive appearing  lytic or blastic lesions noted in the visualized portions of the  skeleton.  CTA ABDOMEN AND PELVIS FINDINGS  Hepatobiliary: Diffuse low attenuation throughout the hepatic  parenchyma, indicative of hepatic steatosis. No suspicious cystic or  solid hepatic lesions. No intra or extrahepatic biliary ductal  dilatation. Status post cholecystectomy.  Pancreas: No pancreatic mass. No pancreatic ductal dilatation. No  pancreatic or peripancreatic fluid  collections or inflammatory  changes.  Spleen: Unremarkable.  Adrenals/Urinary Tract: 4.2  cm low-attenuation lesion in the  anterior aspect of the interpolar region of the right kidney,  compatible with a simple cyst. Other subcentimeter low-attenuation  lesions in both kidneys, too small to definitively characterize, but  statistically likely to represent tiny cysts. 8 mm nonobstructive  calculus in the lower pole collecting system of the right kidney. No  hydroureteronephrosis. Urinary bladder is normal in appearance.  Bilateral adrenal glands are normal in appearance.  Stomach/Bowel: Normal appearance of the stomach. No pathologic  dilatation of small bowel or colon. Status post appendectomy.  Vascular/Lymphatic: Vascular findings and measurements pertinent to  potential TAVR procedure, as detailed below. Aortic atherosclerosis,  without evidence of aneurysm or dissection in the abdominal or  pelvic vasculature. No lymphadenopathy noted in the abdomen or  pelvis.  Reproductive: Status post hysterectomy. Ovaries are not confidently  identified may be surgically absent or atrophic.  Other: No significant volume of ascites. No pneumoperitoneum.  Musculoskeletal: Bilateral pars defects at L4 with 4 mm of  anterolisthesis of L4 upon L5. There are no aggressive appearing  lytic or blastic lesions noted in the visualized portions of the  skeleton.  VASCULAR MEASUREMENTS PERTINENT TO TAVR:  AORTA:  Minimal Aortic Diameter-13 x 13 mm  Severity of Aortic Calcification-moderate  RIGHT PELVIS:  Right Common Iliac Artery -  Minimal Diameter-10.2 x 10.3 mm  Tortuosity-mild  Calcification-mild  Right External Iliac Artery -  Minimal Diameter-7.2 x 7.6 mm  Tortuosity-severe  Calcification-none  Right Common Femoral Artery -  Minimal Diameter-7.0 x 7.0 mm  Tortuosity-mild  Calcification-none  LEFT PELVIS:  Left Common Iliac Artery -  Minimal Diameter-9.4 x 10.1 mm  Tortuosity-mild   Calcification-mild  Left External Iliac Artery -  Minimal Diameter-7.9 x 8.2 mm  Tortuosity-moderate to severe  Calcification-none  Left Common Femoral Artery -  Minimal Diameter-7.7 x 7.9 mm  Tortuosity-mild  Calcification-none  Review of the MIP images confirms the above findings.  IMPRESSION:  1. Vascular findings and measurements pertinent to potential TAVR  procedure, as detailed above.  2. Severe thickening calcification of the aortic valve, compatible  with reported clinical history of severe aortic stenosis.  3. Dilatation of the pulmonic trunk (3.7 cm in diameter), concerning  for pulmonary arterial hypertension.  4. Calcifications of the mitral annulus.  5. Mild cardiomegaly.  6. Multiple small pulmonary nodules measuring 5 mm or less in size,  nonspecific, but statistically likely benign. No follow-up needed if  patient is low-risk (and has no known or suspected primary  neoplasm). Non-contrast chest CT can be considered in 12 months if  patient is high-risk. This recommendation follows the consensus  statement: Guidelines for Management of Incidental Pulmonary Nodules  Detected on CT Images: From the Fleischner Society 2017; Radiology  2017; 284:228-243.  7. 8 mm nonobstructive calculus in the lower pole collecting system  of the right kidney.  8. Additional incidental findings, as above.  9.  Electronically Signed  By: Vinnie Langton M.D.  On: 05/14/2019 11:44    STS score   Procedure: Isolated AVR  Risk of Mortality: 2.025%  Renal Failure: 3.277%  Permanent Stroke: 0.839%  Prolonged Ventilation: 8.112%  DSW Infection: 0.175%  Reoperation: 2.638%  Morbidity or Mortality: 12.563%  Short Length of Stay: 32.396%  Long Length of Stay: 5.868%    Impression:   This 74 year old woman has stage D, severe, symptomatic aortic stenosis with New York Heart Association class III symptoms of exertional fatigue and shortness of breath consistent with chronic  diastolic congestive heart  failure. I have personally reviewed her 2D echocardiogram, cardiac catheterization, and CTA studies. Echocardiogram shows a bicuspid aortic valve with severe thickening and moderate calcification with a mean gradient of 49.2 mmHg and a calculated valve area of 0.58 cm consistent with severe aortic stenosis. Left ventricular ejection fraction is 55 to 60%. Cardiac catheterization shows mild nonobstructive coronary disease. The mean transvalvular gradient was measured at 43 mmHg with normal right heart pressures. I agree that aortic valve replacement is indicated in this patient for relief of her symptoms and to prevent progressive left ventricular deterioration. She is a low surgical risk patient but is 74 years old and her husband is undergoing coronary bypass graft surgery and aortic valve replacement tomorrow and therefore she is not interested in having open surgical aortic valve replacement and wants to pursue TAVR. I think TAVR is a reasonable alternative for her. Her gated cardiac CTA shows anatomy suitable for transcatheter aortic valve replacement using a 23 mm SAPIEN 3 valve or a 29 mm Medtronic Evolut Pro + valve.  Given her weight of 112 kg with a BMI of 41 and a BSA of 2.29 m I think it would be best to use the 29 mm Medtronic valve.  Her abdominal and pelvic CTA shows adequate pelvic vascular anatomy to allow transfemoral insertion.   The patient was counseled at length regarding treatment alternatives for management of severe symptomatic aortic stenosis. The risks and benefits of surgical intervention has been discussed in detail. Long-term prognosis with medical therapy was discussed. Alternative approaches such as conventional surgical aortic valve replacement, transcatheter aortic valve replacement, and palliative medical therapy were compared and contrasted at length. This discussion was placed in the context of the patient's own specific clinical presentation and  past medical history. All of her questions have been addressed.   Following the decision to proceed with transcatheter aortic valve replacement, a discussion was held regarding what types of management strategies would be attempted intraoperatively in the event of life-threatening complications, including whether or not the patient would be considered a candidate for the use of cardiopulmonary bypass and/or conversion to open sternotomy for attempted surgical intervention. She is a low surgical risk patient and would be a candidate for emergent sternotomy if necessary to manage any intraoperative complications.   The patient has been advised of a variety of complications that might develop including but not limited to risks of death, stroke, paravalvular leak, aortic dissection or other major vascular complications, aortic annulus rupture, device embolization, cardiac rupture or perforation, mitral regurgitation, acute myocardial infarction, arrhythmia, heart block or bradycardia requiring permanent pacemaker placement, congestive heart failure, respiratory failure, renal failure, pneumonia, infection, other late complications related to structural valve deterioration or migration, or other complications that might ultimately cause a temporary or permanent loss of functional independence or other long term morbidity. The patient provides full informed consent for the procedure as described and all questions were answered.   Plan:   Transfemoral transcatheter aortic valve replacement using a 29 mm Medtronic Evolut Pro + valve.  Gaye Pollack, MD

## 2019-05-28 ENCOUNTER — Other Ambulatory Visit: Payer: Self-pay

## 2019-05-28 ENCOUNTER — Other Ambulatory Visit: Payer: Self-pay | Admitting: Physician Assistant

## 2019-05-28 ENCOUNTER — Encounter (HOSPITAL_COMMUNITY)
Admission: RE | Disposition: A | Discharge status: Home / Self Care | Payer: Self-pay | Source: Home / Self Care | Attending: Cardiovascular Disease

## 2019-05-28 ENCOUNTER — Ambulatory Visit (HOSPITAL_COMMUNITY): Payer: Medicare Other

## 2019-05-28 ENCOUNTER — Encounter (HOSPITAL_COMMUNITY): Payer: Self-pay | Admitting: Cardiovascular Disease

## 2019-05-28 ENCOUNTER — Inpatient Hospital Stay (HOSPITAL_COMMUNITY)
Admission: RE | Admit: 2019-05-28 | Discharge: 2019-05-29 | DRG: 267 | Disposition: A | Discharge status: Home / Self Care | Payer: Medicare Other | Attending: Cardiovascular Disease | Admitting: Cardiovascular Disease

## 2019-05-28 ENCOUNTER — Inpatient Hospital Stay (HOSPITAL_COMMUNITY): Payer: Medicare Other

## 2019-05-28 ENCOUNTER — Inpatient Hospital Stay (HOSPITAL_COMMUNITY): Payer: Medicare Other | Admitting: Physician Assistant

## 2019-05-28 DIAGNOSIS — Z79810 Long term (current) use of selective estrogen receptor modulators (SERMs): Secondary | ICD-10-CM

## 2019-05-28 DIAGNOSIS — Z006 Encounter for examination for normal comparison and control in clinical research program: Secondary | ICD-10-CM | POA: Diagnosis not present

## 2019-05-28 DIAGNOSIS — I2510 Atherosclerotic heart disease of native coronary artery without angina pectoris: Secondary | ICD-10-CM | POA: Diagnosis present

## 2019-05-28 DIAGNOSIS — Z90710 Acquired absence of both cervix and uterus: Secondary | ICD-10-CM

## 2019-05-28 DIAGNOSIS — I5032 Chronic diastolic (congestive) heart failure: Secondary | ICD-10-CM | POA: Diagnosis present

## 2019-05-28 DIAGNOSIS — G8929 Other chronic pain: Secondary | ICD-10-CM | POA: Diagnosis present

## 2019-05-28 DIAGNOSIS — I130 Hypertensive heart and chronic kidney disease with heart failure and stage 1 through stage 4 chronic kidney disease, or unspecified chronic kidney disease: Secondary | ICD-10-CM | POA: Diagnosis present

## 2019-05-28 DIAGNOSIS — I350 Nonrheumatic aortic (valve) stenosis: Secondary | ICD-10-CM

## 2019-05-28 DIAGNOSIS — Z79899 Other long term (current) drug therapy: Secondary | ICD-10-CM

## 2019-05-28 DIAGNOSIS — Z6841 Body Mass Index (BMI) 40.0 and over, adult: Secondary | ICD-10-CM

## 2019-05-28 DIAGNOSIS — M5416 Radiculopathy, lumbar region: Secondary | ICD-10-CM | POA: Diagnosis present

## 2019-05-28 DIAGNOSIS — M25561 Pain in right knee: Secondary | ICD-10-CM | POA: Diagnosis present

## 2019-05-28 DIAGNOSIS — I10 Essential (primary) hypertension: Secondary | ICD-10-CM | POA: Diagnosis present

## 2019-05-28 DIAGNOSIS — D0512 Intraductal carcinoma in situ of left breast: Secondary | ICD-10-CM | POA: Diagnosis not present

## 2019-05-28 DIAGNOSIS — F40240 Claustrophobia: Secondary | ICD-10-CM | POA: Diagnosis present

## 2019-05-28 DIAGNOSIS — K219 Gastro-esophageal reflux disease without esophagitis: Secondary | ICD-10-CM | POA: Diagnosis present

## 2019-05-28 DIAGNOSIS — Z20822 Contact with and (suspected) exposure to covid-19: Secondary | ICD-10-CM | POA: Diagnosis present

## 2019-05-28 DIAGNOSIS — Z952 Presence of prosthetic heart valve: Secondary | ICD-10-CM | POA: Diagnosis not present

## 2019-05-28 DIAGNOSIS — N183 Chronic kidney disease, stage 3 unspecified: Secondary | ICD-10-CM | POA: Diagnosis present

## 2019-05-28 DIAGNOSIS — Z9049 Acquired absence of other specified parts of digestive tract: Secondary | ICD-10-CM | POA: Diagnosis not present

## 2019-05-28 HISTORY — DX: Malignant neoplasm of unspecified site of unspecified female breast: C50.919

## 2019-05-28 HISTORY — PX: TRANSCATHETER AORTIC VALVE REPLACEMENT, TRANSFEMORAL: SHX6400

## 2019-05-28 HISTORY — PX: INTRAOPERATIVE TRANSTHORACIC ECHOCARDIOGRAM: SHX6523

## 2019-05-28 HISTORY — DX: Presence of prosthetic heart valve: Z95.2

## 2019-05-28 HISTORY — DX: Chronic kidney disease, stage 3 unspecified: N18.30

## 2019-05-28 LAB — POCT I-STAT, CHEM 8
BUN: 17 mg/dL (ref 8–23)
Calcium, Ion: 1.2 mmol/L (ref 1.15–1.40)
Chloride: 110 mmol/L (ref 98–111)
Creatinine, Ser: 0.8 mg/dL (ref 0.44–1.00)
Glucose, Bld: 108 mg/dL — ABNORMAL HIGH (ref 70–99)
HCT: 28 % — ABNORMAL LOW (ref 36.0–46.0)
Hemoglobin: 9.5 g/dL — ABNORMAL LOW (ref 12.0–15.0)
Potassium: 3.6 mmol/L (ref 3.5–5.1)
Sodium: 144 mmol/L (ref 135–145)
TCO2: 24 mmol/L (ref 22–32)

## 2019-05-28 SURGERY — IMPLANTATION, AORTIC VALVE, TRANSCATHETER, FEMORAL APPROACH
Anesthesia: Monitor Anesthesia Care | Site: Chest

## 2019-05-28 MED ORDER — SODIUM CHLORIDE 0.9% FLUSH
3.0000 mL | Freq: Two times a day (BID) | INTRAVENOUS | Status: DC
  Administered 2019-05-28 – 2019-05-29 (×2): 3 mL via INTRAVENOUS

## 2019-05-28 MED ORDER — SODIUM CHLORIDE 0.9 % IV SOLN: 250.0000 mL | INTRAVENOUS | Status: DC | PRN

## 2019-05-28 MED ORDER — ASPIRIN 81 MG PO CHEW
81.0000 mg | CHEWABLE_TABLET | Freq: Every day | ORAL | Status: DC
  Administered 2019-05-29: 81 mg via ORAL
  Filled 2019-05-28: qty 1

## 2019-05-28 MED ORDER — MORPHINE SULFATE (PF) 2 MG/ML IV SOLN: 1.0000 mg | INTRAVENOUS | Status: DC | PRN

## 2019-05-28 MED ORDER — FENTANYL CITRATE (PF) 250 MCG/5ML IJ SOLN
INTRAMUSCULAR | Status: DC | PRN
  Administered 2019-05-28: 50 ug via INTRAVENOUS

## 2019-05-28 MED ORDER — EPHEDRINE SULFATE-NACL 50-0.9 MG/10ML-% IV SOSY
PREFILLED_SYRINGE | INTRAVENOUS | Status: DC | PRN
  Administered 2019-05-28: 15 mg via INTRAVENOUS

## 2019-05-28 MED ORDER — LACTATED RINGERS IV SOLN: INTRAVENOUS | Status: DC | PRN

## 2019-05-28 MED ORDER — SODIUM CHLORIDE 0.9 % IV SOLN: INTRAVENOUS | Status: AC

## 2019-05-28 MED ORDER — SODIUM CHLORIDE 0.9% FLUSH: 3.0000 mL | INTRAVENOUS | Status: DC | PRN

## 2019-05-28 MED ORDER — PHENYLEPHRINE HCL-NACL 20-0.9 MG/250ML-% IV SOLN
0.0000 ug/min | INTRAVENOUS | Status: DC
  Filled 2019-05-28: qty 250

## 2019-05-28 MED ORDER — PROPOFOL 10 MG/ML IV BOLUS
INTRAVENOUS | Status: DC | PRN
  Administered 2019-05-28 (×2): 10 mg via INTRAVENOUS

## 2019-05-28 MED ORDER — SODIUM CHLORIDE 0.9 % IV SOLN
INTRAVENOUS | Status: DC | PRN
  Administered 2019-05-28: 1000 mL

## 2019-05-28 MED ORDER — VANCOMYCIN HCL IN DEXTROSE 1-5 GM/200ML-% IV SOLN
1000.0000 mg | Freq: Once | INTRAVENOUS | Status: AC
  Administered 2019-05-29: 1000 mg via INTRAVENOUS
  Filled 2019-05-28 (×2): qty 200

## 2019-05-28 MED ORDER — TAMOXIFEN CITRATE 10 MG PO TABS
20.0000 mg | ORAL_TABLET | Freq: Every day | ORAL | Status: DC
  Administered 2019-05-28 – 2019-05-29 (×2): 20 mg via ORAL
  Filled 2019-05-28 (×3): qty 2

## 2019-05-28 MED ORDER — CHLORHEXIDINE GLUCONATE 4 % EX LIQD: 60.0000 mL | Freq: Once | CUTANEOUS | Status: DC

## 2019-05-28 MED ORDER — OXYCODONE HCL 5 MG PO TABS: 5.0000 mg | ORAL_TABLET | ORAL | Status: DC | PRN

## 2019-05-28 MED ORDER — HEPARIN SODIUM (PORCINE) 1000 UNIT/ML IJ SOLN
INTRAMUSCULAR | Status: DC | PRN
  Administered 2019-05-28: 15000 [IU] via INTRAVENOUS

## 2019-05-28 MED ORDER — SODIUM CHLORIDE 0.9 % IV SOLN
INTRAVENOUS | Status: AC
  Filled 2019-05-28 (×3): qty 1.2

## 2019-05-28 MED ORDER — IODIXANOL 320 MG/ML IV SOLN
INTRAVENOUS | Status: DC | PRN
  Administered 2019-05-28: 81 mL

## 2019-05-28 MED ORDER — TRAMADOL HCL 50 MG PO TABS: 50.0000 mg | ORAL_TABLET | ORAL | Status: DC | PRN

## 2019-05-28 MED ORDER — ONDANSETRON HCL 4 MG/2ML IJ SOLN: 4.0000 mg | Freq: Four times a day (QID) | INTRAMUSCULAR | Status: DC | PRN

## 2019-05-28 MED ORDER — LEVOFLOXACIN IN D5W 750 MG/150ML IV SOLN
750.0000 mg | INTRAVENOUS | Status: AC
  Administered 2019-05-29: 750 mg via INTRAVENOUS
  Filled 2019-05-28: qty 150

## 2019-05-28 MED ORDER — ACETAMINOPHEN 650 MG RE SUPP: 650.0000 mg | Freq: Four times a day (QID) | RECTAL | Status: DC | PRN

## 2019-05-28 MED ORDER — LIDOCAINE HCL 1 % IJ SOLN
INTRAMUSCULAR | Status: DC | PRN
  Administered 2019-05-28: 5 mL

## 2019-05-28 MED ORDER — NITROGLYCERIN IN D5W 200-5 MCG/ML-% IV SOLN: 0.0000 ug/min | INTRAVENOUS | Status: DC

## 2019-05-28 MED ORDER — CLOPIDOGREL BISULFATE 75 MG PO TABS
75.0000 mg | ORAL_TABLET | Freq: Every day | ORAL | Status: DC
  Administered 2019-05-29: 09:00:00 75 mg via ORAL
  Filled 2019-05-28: qty 1

## 2019-05-28 MED ORDER — LIDOCAINE HCL (PF) 1 % IJ SOLN
INTRAMUSCULAR | Status: AC
  Filled 2019-05-28: qty 30

## 2019-05-28 MED ORDER — LORATADINE 10 MG PO TABS
10.0000 mg | ORAL_TABLET | Freq: Every day | ORAL | Status: DC
  Administered 2019-05-28 – 2019-05-29 (×2): 10 mg via ORAL
  Filled 2019-05-28 (×2): qty 1

## 2019-05-28 MED ORDER — PHENYLEPHRINE 40 MCG/ML (10ML) SYRINGE FOR IV PUSH (FOR BLOOD PRESSURE SUPPORT)
PREFILLED_SYRINGE | INTRAVENOUS | Status: DC | PRN
  Administered 2019-05-28: 160 ug via INTRAVENOUS
  Administered 2019-05-28 (×2): 120 ug via INTRAVENOUS

## 2019-05-28 MED ORDER — MIDAZOLAM HCL 2 MG/2ML IJ SOLN
INTRAMUSCULAR | Status: AC
  Filled 2019-05-28: qty 2

## 2019-05-28 MED ORDER — SODIUM CHLORIDE 0.9 % IV SOLN: INTRAVENOUS | Status: DC

## 2019-05-28 MED ORDER — MIDAZOLAM HCL 2 MG/2ML IJ SOLN
INTRAMUSCULAR | Status: DC | PRN
  Administered 2019-05-28: 1 mg via INTRAVENOUS

## 2019-05-28 MED ORDER — DEXMEDETOMIDINE HCL IN NACL 200 MCG/50ML IV SOLN
INTRAVENOUS | Status: DC | PRN
  Administered 2019-05-28: .7 ug/kg/h via INTRAVENOUS

## 2019-05-28 MED ORDER — PANTOPRAZOLE SODIUM 40 MG PO TBEC
40.0000 mg | DELAYED_RELEASE_TABLET | Freq: Every day | ORAL | Status: DC
  Administered 2019-05-28 – 2019-05-29 (×2): 40 mg via ORAL
  Filled 2019-05-28 (×2): qty 1

## 2019-05-28 MED ORDER — PROTAMINE SULFATE 10 MG/ML IV SOLN
INTRAVENOUS | Status: DC | PRN
  Administered 2019-05-28: 150 mg via INTRAVENOUS

## 2019-05-28 MED ORDER — CHLORHEXIDINE GLUCONATE 4 % EX LIQD: 30.0000 mL | CUTANEOUS | Status: DC

## 2019-05-28 MED ORDER — ACETAMINOPHEN 325 MG PO TABS
650.0000 mg | ORAL_TABLET | Freq: Four times a day (QID) | ORAL | Status: DC | PRN
  Filled 2019-05-28: qty 2

## 2019-05-28 MED ORDER — CHLORHEXIDINE GLUCONATE 0.12 % MT SOLN
15.0000 mL | Freq: Once | OROMUCOSAL | Status: AC
  Administered 2019-05-28: 13:00:00 15 mL via OROMUCOSAL
  Filled 2019-05-28: qty 15

## 2019-05-28 MED ORDER — FENTANYL CITRATE (PF) 250 MCG/5ML IJ SOLN
INTRAMUSCULAR | Status: AC
  Filled 2019-05-28: qty 5

## 2019-05-28 MED ORDER — ESMOLOL HCL 100 MG/10ML IV SOLN
INTRAVENOUS | Status: AC
  Filled 2019-05-28: qty 10

## 2019-05-28 MED ORDER — PROPOFOL 500 MG/50ML IV EMUL
INTRAVENOUS | Status: DC | PRN
  Administered 2019-05-28: 10 ug/kg/min via INTRAVENOUS

## 2019-05-28 SURGICAL SUPPLY — 96 items
ADH SKN CLS APL DERMABOND .7 (GAUZE/BANDAGES/DRESSINGS) ×3
APL PRP STRL LF DISP 70% ISPRP (MISCELLANEOUS) ×3
BAG DECANTER FOR FLEXI CONT (MISCELLANEOUS) ×2 IMPLANT
BAG SNAP BAND KOVER 36X36 (MISCELLANEOUS) ×5 IMPLANT
BALLN TRUE 18X4.5 (BALLOONS) ×4
BALLN TRUE 18X4.5CM (BALLOONS) ×1
BALLOON TRUE 18X4.5 (BALLOONS) IMPLANT
BLADE CLIPPER SURG (BLADE) IMPLANT
BLADE STERNUM SYSTEM 6 (BLADE) IMPLANT
BLADE SURG 10 STRL SS (BLADE) IMPLANT
CABLE ADAPT CONN TEMP 6FT (ADAPTER) ×5 IMPLANT
CANISTER SUCT 3000ML PPV (MISCELLANEOUS) IMPLANT
CATH DIAG EXPO 6F AL1 (CATHETERS) IMPLANT
CATH DIAG EXPO 6F VENT PIG 145 (CATHETERS) ×10 IMPLANT
CATH EXTERNAL FEMALE PUREWICK (CATHETERS) IMPLANT
CATH INFINITI 6F AL2 (CATHETERS) ×2 IMPLANT
CATH S G BIP PACING (CATHETERS) ×5 IMPLANT
CHLORAPREP W/TINT 26 (MISCELLANEOUS) ×5 IMPLANT
CLIP VESOCCLUDE MED 24/CT (CLIP) IMPLANT
CLIP VESOCCLUDE SM WIDE 24/CT (CLIP) IMPLANT
CLOSURE MYNX CONTROL 6F/7F (Vascular Products) ×2 IMPLANT
CNTNR URN SCR LID CUP LEK RST (MISCELLANEOUS) ×6 IMPLANT
CONT SPEC 4OZ STRL OR WHT (MISCELLANEOUS) ×10
COVER BACK TABLE 80X110 HD (DRAPES) ×2 IMPLANT
COVER WAND RF STERILE (DRAPES) ×3 IMPLANT
DECANTER SPIKE VIAL GLASS SM (MISCELLANEOUS) ×3 IMPLANT
DERMABOND ADVANCED (GAUZE/BANDAGES/DRESSINGS) ×2
DERMABOND ADVANCED .7 DNX12 (GAUZE/BANDAGES/DRESSINGS) ×3 IMPLANT
DEVICE CLOSURE PERCLS PRGLD 6F (VASCULAR PRODUCTS) ×6 IMPLANT
DRAPE INCISE IOBAN 66X45 STRL (DRAPES) IMPLANT
DRSG TEGADERM 4X4.75 (GAUZE/BANDAGES/DRESSINGS) ×8 IMPLANT
DRYSEAL FLEXSHEATH 18FR 33CM (SHEATH) ×2
ELECT CAUTERY BLADE 6.4 (BLADE) IMPLANT
ELECT REM PT RETURN 9FT ADLT (ELECTROSURGICAL) ×10
ELECTRODE REM PT RTRN 9FT ADLT (ELECTROSURGICAL) ×6 IMPLANT
FELT TEFLON 6X6 (MISCELLANEOUS) ×3 IMPLANT
GAUZE 4X4 16PLY RFD (DISPOSABLE) ×2 IMPLANT
GAUZE SPONGE 4X4 12PLY STRL (GAUZE/BANDAGES/DRESSINGS) ×3 IMPLANT
GLOVE BIO SURGEON STRL SZ7.5 (GLOVE) ×2 IMPLANT
GLOVE BIO SURGEON STRL SZ8 (GLOVE) ×2 IMPLANT
GLOVE EUDERMIC 7 POWDERFREE (GLOVE) ×4 IMPLANT
GLOVE ORTHO TXT STRL SZ7.5 (GLOVE) IMPLANT
GOWN STRL REUS W/ TWL LRG LVL3 (GOWN DISPOSABLE) IMPLANT
GOWN STRL REUS W/ TWL XL LVL3 (GOWN DISPOSABLE) ×3 IMPLANT
GOWN STRL REUS W/TWL LRG LVL3 (GOWN DISPOSABLE) ×15
GOWN STRL REUS W/TWL XL LVL3 (GOWN DISPOSABLE) ×5
GUIDEWIRE SAF TJ AMPL .035X180 (WIRE) ×7 IMPLANT
GUIDEWIRE SAFE TJ AMPLATZ EXST (WIRE) ×5 IMPLANT
INSERT FOGARTY SM (MISCELLANEOUS) IMPLANT
KIT BASIN OR (CUSTOM PROCEDURE TRAY) ×5 IMPLANT
KIT HEART LEFT (KITS) ×5 IMPLANT
KIT SUCTION CATH 14FR (SUCTIONS) IMPLANT
KIT TURNOVER KIT B (KITS) ×5 IMPLANT
LOOP VESSEL MAXI BLUE (MISCELLANEOUS) IMPLANT
LOOP VESSEL MINI RED (MISCELLANEOUS) IMPLANT
NS IRRIG 1000ML POUR BTL (IV SOLUTION) ×7 IMPLANT
PACK ENDO MINOR (CUSTOM PROCEDURE TRAY) ×5 IMPLANT
PAD ARMBOARD 7.5X6 YLW CONV (MISCELLANEOUS) ×10 IMPLANT
PAD ELECT DEFIB RADIOL ZOLL (MISCELLANEOUS) ×5 IMPLANT
PENCIL BUTTON HOLSTER BLD 10FT (ELECTRODE) IMPLANT
PERCLOSE PROGLIDE 6F (VASCULAR PRODUCTS) ×10
POSITIONER HEAD DONUT 9IN (MISCELLANEOUS) ×5 IMPLANT
SET MICROPUNCTURE 5F STIFF (MISCELLANEOUS) ×5 IMPLANT
SHEATH BRITE TIP 7FR 35CM (SHEATH) ×5 IMPLANT
SHEATH DRYSEAL FLEX 18FR 33CM (SHEATH) IMPLANT
SHEATH PINNACLE 6F 10CM (SHEATH) ×5 IMPLANT
SHEATH PINNACLE 8F 10CM (SHEATH) ×5 IMPLANT
SHIELD RADPAD SCOOP 12X17 (MISCELLANEOUS) ×2 IMPLANT
SLEEVE REPOSITIONING LENGTH 30 (MISCELLANEOUS) ×5 IMPLANT
SPONGE GAUZE 2X2 8PLY STER LF (GAUZE/BANDAGES/DRESSINGS) ×1
SPONGE GAUZE 2X2 8PLY STRL LF (GAUZE/BANDAGES/DRESSINGS) ×1 IMPLANT
SPONGE LAP 18X18 RF (DISPOSABLE) ×2 IMPLANT
STOPCOCK MORSE 400PSI 3WAY (MISCELLANEOUS) ×10 IMPLANT
SUT ETHIBOND X763 2 0 SH 1 (SUTURE) IMPLANT
SUT GORETEX CV 4 TH 22 36 (SUTURE) IMPLANT
SUT GORETEX CV4 TH-18 (SUTURE) IMPLANT
SUT MNCRL AB 3-0 PS2 18 (SUTURE) IMPLANT
SUT PROLENE 5 0 C 1 36 (SUTURE) IMPLANT
SUT PROLENE 6 0 C 1 30 (SUTURE) IMPLANT
SUT SILK  1 MH (SUTURE) ×5
SUT SILK 1 MH (SUTURE) ×3 IMPLANT
SUT VIC AB 2-0 CT1 27 (SUTURE)
SUT VIC AB 2-0 CT1 TAPERPNT 27 (SUTURE) IMPLANT
SUT VIC AB 2-0 CTX 36 (SUTURE) IMPLANT
SUT VIC AB 3-0 SH 8-18 (SUTURE) IMPLANT
SYR 10ML LL (SYRINGE) ×2 IMPLANT
SYR 50ML LL SCALE MARK (SYRINGE) ×5 IMPLANT
SYR BULB IRRIGATION 50ML (SYRINGE) IMPLANT
SYR MEDRAD MARK V 150ML (SYRINGE) ×5 IMPLANT
TOWEL GREEN STERILE (TOWEL DISPOSABLE) ×10 IMPLANT
TRANSDUCER W/STOPCOCK (MISCELLANEOUS) ×10 IMPLANT
TRAY FOLEY SLVR 14FR TEMP STAT (SET/KITS/TRAYS/PACK) IMPLANT
TUBE SUCT INTRACARD DLP 20F (MISCELLANEOUS) IMPLANT
VALVE AORTIC EVOLUT PROPLUS 29 (Valve) ×2 IMPLANT
WIRE EMERALD 3MM-J .035X150CM (WIRE) ×5 IMPLANT
WIRE EMERALD 3MM-J .035X260CM (WIRE) ×5 IMPLANT

## 2019-05-28 NOTE — Transfer of Care (Signed)
Immediate Anesthesia Transfer of Care Note  Patient: Elizabeth [name]  Procedure(s) Performed: TRANSCATHETER AORTIC VALVE REPLACEMENT, TRANSFEMORAL (N/A Chest) Intraoperative Transthoracic Echocardiogram  Patient Location: PACU and Cath Lab  Anesthesia Type:MAC  Level of Consciousness: awake, alert  and patient cooperative  Airway & Oxygen Therapy: Patient Spontanous Breathing  Post-op Assessment: Report given to RN and Post -op Vital signs reviewed and stable  Post vital signs: Reviewed and stable  Last Vitals:  Vitals Value Taken Time  BP    Temp    Pulse    Resp    SpO2      Last Pain:  Vitals:   05/28/19 1209  TempSrc:   PainSc: 0-No pain         Complications: No apparent anesthesia complications

## 2019-05-28 NOTE — Progress Notes (Signed)
Patient updated on delay 

## 2019-05-28 NOTE — Interval H&P Note (Signed)
History and Physical Interval Note:  05/28/2019 2:15 PM  Elizabeth [name]  has presented today for surgery, with the diagnosis of Severe Aortic Stenosis.  The various methods of treatment have been discussed with the patient and family. After consideration of risks, benefits and other options for treatment, the patient has consented to  Procedure(s): TRANSCATHETER AORTIC VALVE REPLACEMENT, TRANSFEMORAL (N/A) TRANSESOPHAGEAL ECHOCARDIOGRAM (TEE) (N/A) as a surgical intervention.  The patient's history has been reviewed, patient examined, no change in status, stable for surgery.  I have reviewed the patient's chart and labs.  Questions were answered to the patient's satisfaction.     Gaye Pollack

## 2019-05-28 NOTE — Anesthesia Preprocedure Evaluation (Signed)
Anesthesia Evaluation  Patient identified by MRN, date of birth, ID band Patient awake    Reviewed: Allergy & Precautions, NPO status , Patient's Chart, lab work & pertinent test results, reviewed documented beta blocker date and time   Airway Mallampati: II  TM Distance: >3 FB Neck ROM: Full    Dental  (+) Teeth Intact, Dental Advisory Given   Pulmonary neg pulmonary ROS,    Pulmonary exam normal breath sounds clear to auscultation       Cardiovascular hypertension, Pt. on medications and Pt. on home beta blockers + Valvular Problems/Murmurs AS  Rhythm:Regular Rate:Normal + Systolic murmurs    Neuro/Psych PSYCHIATRIC DISORDERS Anxiety  Neuromuscular disease    GI/Hepatic Neg liver ROS, GERD  Medicated,  Endo/Other  Morbid obesity  Renal/GU Renal InsufficiencyRenal disease     Musculoskeletal negative musculoskeletal ROS (+)   Abdominal   Peds  Hematology  (+) Blood dyscrasia, anemia ,   Anesthesia Other Findings Day of surgery medications reviewed with the patient.  Reproductive/Obstetrics                             Anesthesia Physical Anesthesia Plan  ASA: IV  Anesthesia Plan: MAC   Post-op Pain Management:    Induction: Intravenous  PONV Risk Score and Plan: 2 and Propofol infusion and Treatment may vary due to age or medical condition  Airway Management Planned: Nasal Cannula, Natural Airway and Simple Face Mask  Additional Equipment:   Intra-op Plan:   Post-operative Plan:   Informed Consent: I have reviewed the patients History and Physical, chart, labs and discussed the procedure including the risks, benefits and alternatives for the proposed anesthesia with the patient or authorized representative who has indicated his/her understanding and acceptance.     Dental advisory given  Plan Discussed with: CRNA and Anesthesiologist  Anesthesia Plan Comments:          Anesthesia Quick Evaluation

## 2019-05-28 NOTE — H&P (Signed)
Cardiology Admission History and Physical:   Patient ID: Elizabeth [name] MRN: [id number]; DOB: November 24, 1945   Admission date: 05/28/2019  Primary Care Provider: Marco Collie, MD Primary Cardiologist: No primary care provider on file.  Primary Electrophysiologist:  None   Chief Complaint: Shortness of breath  Patient Profile:   Elizabeth [name] is a 74 y.o. female with severe symptomatic aortic stenosis, presenting for TAVR May 28, 2019  History of Present Illness:   Ms. Waligorski has a history of hypertension, mild chronic kidney disease stage II, and progressive aortic stenosis.  She has been diagnosed with severe aortic stenosis after presenting with symptoms of congestive heart failure in 2020.  She was found to have a bicuspid aortic valve with mean transvalvular gradient 49 mmHg.  LVEF has been normal at 55 to 60%.  Cardiac catheterization demonstrated mild nonobstructive coronary artery disease with a direct catheter measured mean gradient of 43 mmHg.  The patient became sick with COVID-19 infection and delayed TAVR for several months.  Her husband has recently undergone CABG with aortic valve replacement and he remains hospitalized.  The patient wanted to proceed with TAVR so that she and her husband could both be cared for by their family members.  The patient reports progressive exertional dyspnea with low-level activity.  She denies orthopnea or PND.  She has no chest pain or chest pressure.  She denies lightheadedness or syncope.   Past Medical History:  Diagnosis Date  . Anxiety    extreme  . Benign hypertension with CKD (chronic kidney disease), stage II   . Cancer ([place])    Pre-cancerous cell in Left breast  . Claustrophobia   . Dyspnea   . GERD (gastroesophageal reflux disease)   . Heart murmur   . History of kidney stones   . Hyperuricemia   . Lumbar stenosis   . Osteoarthrosis   . Severe aortic stenosis   . Vitamin D deficiency     Past Surgical History:    Procedure Laterality Date  . ABDOMINAL HYSTERECTOMY    . ABDOMINAL HYSTERECTOMY    . APPENDECTOMY    . BACK SURGERY    . CARDIAC CATHETERIZATION  03/11/2019  . CHOLECYSTECTOMY    . GALLBLADDER SURGERY    . IR RADIOLOGY PERIPHERAL GUIDED IV START  05/14/2019  . IR US GUIDE VASC ACCESS RIGHT  05/14/2019  . LUMBAR LAMINECTOMY/DECOMPRESSION MICRODISCECTOMY N/A 06/12/2017   Procedure: LAMINECTOMY AND FORAMINOTOMY LUMBAR THREE- LUMBAR FOUR, LUMBAR FOUR- LUMBAR FIVE;  Surgeon: Newman Pies, MD;  Location: [facility];  Service: Neurosurgery;  Laterality: N/A;  . RIGHT/LEFT HEART CATH AND CORONARY ANGIOGRAPHY N/A 03/11/2019   Procedure: RIGHT/LEFT HEART CATH AND CORONARY ANGIOGRAPHY;  Surgeon: Sherren Mocha, MD;  Location: [facility];  Service: Cardiovascular;  Laterality: N/A;  . TONSILLECTOMY       Medications Prior to Admission: Prior to Admission medications   Medication Sig Start Date End Date Taking? Authorizing Provider  amLODipine (NORVASC) 5 MG tablet Take 1 tablet (5 mg total) by mouth daily. 03/04/19  Yes Richardo Priest, MD  carvedilol (COREG) 6.25 MG tablet Take 6.25 mg by mouth 2 (two) times daily with a meal.  09/10/18  Yes [provider]  fexofenadine (ALLEGRA) 180 MG tablet Take 180 mg by mouth daily as needed (allergies ([place] only)).  04/17/19  Yes [provider]  furosemide (LASIX) 20 MG tablet Take 1 tablet (20 mg total) by mouth daily. 03/04/19  Yes Richardo Priest, MD  ibuprofen (ADVIL) 800 MG tablet Take 800 mg by mouth every 8 (eight) hours as needed (arthritis pain.).   Yes [provider]  lisinopril (ZESTRIL) 10 MG tablet Take 10 mg by mouth daily.  09/10/18  Yes [provider]  tamoxifen (NOLVADEX) 20 MG tablet TAKE 1 TABLET(20 MG) BY MOUTH DAILY Patient taking differently: Take 20 mg by mouth daily.  05/02/19  Yes Nicholas Lose, MD  omeprazole (PRILOSEC) 40 MG capsule Take 40 mg by mouth daily as needed (acid reflux).   11/12/18   [provider]     Allergies:    Allergies  Allergen Reactions  . Penicillins Hives and Other (See Comments)    Has patient had a PCN reaction causing immediate rash, facial/tongue/throat swelling, SOB or lightheadedness with hypotension:    #  #  YES  #  #  Has patient had a PCN reaction causing severe rash involving mucus membranes or skin necrosis:    #  #  YES  #  #  Has patient had a PCN reaction that required hospitalization: No Has patient had a PCN reaction occurring within the last 10 years: No    . Codeine Nausea And Vomiting  . Lipitor [Atorvastatin] Itching and Other (See Comments)    Redness and itching all over body  . Prednisone Other (See Comments)    Red and hot all over     Social History:   Social History   Socioeconomic History  . Marital status: Married    Spouse name: Not on file  . Number of children: 2  . Years of education: 38  . Highest education level: Not on file  Occupational History  . Occupation: child care  Tobacco Use  . Smoking status: Never Smoker  . Smokeless tobacco: Never Used  Substance and Sexual Activity  . Alcohol use: No  . Drug use: No  . Sexual activity: Not on file  Other Topics Concern  . Not on file  Social History Narrative   Lives with husband in a one story home.  Has 2 children.  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  Education: high school.     Social Determinants of Health   Financial Resource Strain:   . Difficulty of Paying Living Expenses:   Food Insecurity:   . Worried About Charity fundraiser in the Last Year:   . Arboriculturist in the Last Year:   Transportation Needs:   . Film/video editor (Medical):   Marland Kitchen Lack of Transportation (Non-Medical):   Physical Activity:   . Days of Exercise per Week:   . Minutes of Exercise per Session:   Stress:   . Feeling of Stress :   Social Connections:   . Frequency of Communication with Friends and Family:   . Frequency of Social Gatherings  with Friends and Family:   . Attends Religious Services:   . Active Member of Clubs or Organizations:   . Attends Archivist Meetings:   Marland Kitchen Marital Status:   Intimate Partner Violence:   . Fear of Current or Ex-Partner:   . Emotionally Abused:   Marland Kitchen Physically Abused:   . Sexually Abused:     Family History:   The patient's family history includes Alzheimer's disease in her mother; Aneurysm in her father; Stomach cancer in her brother.    ROS:  Please see the history of present illness.  Knee pain, all other ROS reviewed and negative.  Physical Exam/Data:   Vitals:   05/28/19 1151  BP: (!) 166/67  Pulse: 60  Resp: 18  Temp: 98.4 F (36.9 C)  TempSrc: Oral  SpO2: 99%  Weight: 108.9 kg  Height: 5\' 5"  (1.651 m)   No intake or output data in the 24 hours ending 05/28/19 1358 Last 3 Weights 05/28/2019 05/24/2019 05/15/2019  Weight (lbs) 240 lb 246 lb 240 lb  Weight (kg) 108.863 kg 111.585 kg 108.863 kg     Body mass index is 39.94 kg/m.  General:  Well nourished, well developed, in no acute distress HEENT: normal Lymph: no adenopathy Neck: no JVD Endocrine:  No thryomegaly Vascular: Bilateral carotid bruits;  Cardiac:  normal S1, S2; RRR; 3/6 harsh systolic murmur at the right upper sternal border Lungs:  clear to auscultation bilaterally, no wheezing, rhonchi or rales  Abd: soft, nontender, no hepatomegaly  Ext: Trace bilateral pretibial edema Musculoskeletal:  No deformities, BUE and BLE strength normal and equal Skin: warm and dry  Neuro:  CNs 2-12 intact, no focal abnormalities noted Psych:  Normal affect    EKG:  The ECG that was done 05/24/2019 was personally reviewed and demonstrates sinus bradycardia 57 bpm, occasional PVCs  Laboratory Data:  High Sensitivity Troponin:  No results for input(s): TROPONINIHS in the last 720 hours.    Chemistry Recent Labs  Lab 05/24/19 1430  NA 141  K 3.6  CL 111  CO2 20*  GLUCOSE 93  BUN 18  CREATININE  1.03*  CALCIUM 8.7*  GFRNONAA 54*  GFRAA >60  ANIONGAP 10    Recent Labs  Lab 05/24/19 1430  PROT 6.3*  ALBUMIN 3.6  AST 18  ALT 13  ALKPHOS 50  BILITOT 0.3   Hematology Recent Labs  Lab 05/24/19 1430  WBC 8.0  RBC 3.78*  HGB 11.7*  HCT 34.6*  MCV 91.5  MCH 31.0  MCHC 33.8  RDW 12.5  PLT 179   BNP Recent Labs  Lab 05/24/19 1430  BNP 121.3*    DDimer No results for input(s): DDIMER in the last 168 hours.   Radiology/Studies:  No results found.   Assessment and Plan:   74 year old woman with severe, stage D, symptomatic aortic stenosis and associated New York Heart Association functional class III symptoms of chronic diastolic heart failure.  I have reviewed all of her preoperative studies including her 2D echocardiogram, cardiac catheterization, and gated CTA of the heart.  Also have reviewed her CTA of the chest, abdomen, and pelvis.  Our multidisciplinary heart team has discussed her case at length.  We agree that she is a good candidate for a supraannular self-expanding transcatheter heart valve (Medtronic evolute) to avoid patient prosthesis mismatch with a relatively small annulus.  She does appear to have suitable transfemoral access.  I have reviewed potential risks, indications, and alternatives with the patient at length.  Full informed consent is obtained.    For questions or updates, please contact [place] Please consult [email or web address] for contact info under        Signed, Sherren Mocha, MD  05/28/2019 1:58 PM

## 2019-05-28 NOTE — Op Note (Signed)
[facility]   MULTIDISCIPLINARY HEART VALVE TEAM   TAVR OPERATIVE NOTE    Qunita Aase [id number]   Date of Procedure:  05/28/2019  Preoperative Diagnosis: Severe Aortic Stenosis   Postoperative Diagnosis: Same   Procedure:    Transcatheter Aortic Valve Replacement - Percutaneous Right Transfemoral Approach  Medtronic Evolut Pro +(size 29 mm,  serial # [id number])   Co-Surgeons:  Gaye Pollack, MD and Sherren Mocha, MD   Anesthesiologist:  S. Gifford Shave, MD  Echocardiographer:  Liane Comber, MD  Pre-operative Echo Findings:  Severe aortic stenosis  Normal left ventricular systolic function  Post-operative Echo Findings:  Mild paravalvular leak  Normal left ventricular systolic function   BRIEF CLINICAL NOTE AND INDICATIONS FOR SURGERY  This 74 year old woman has stage D, severe, symptomatic aortic stenosis with New York Heart Association class III symptoms of exertional fatigue and shortness of breath consistent with chronic diastolic congestive heart failure. I have personally reviewed her 2D echocardiogram, cardiac catheterization, and CTA studies. Echocardiogram shows a bicuspid aortic valve with severe thickening and moderate calcification with a mean gradient of 49.2 mmHg and a calculated valve area of 0.58 cm consistent with severe aortic stenosis. Left ventricular ejection fraction is 55 to 60%. Cardiac catheterization shows mild nonobstructive coronary disease. The mean transvalvular gradient was measured at 43 mmHg with normal right heart pressures. I agree that aortic valve replacement is indicated in this patient for relief of her symptoms and to prevent progressive left ventricular deterioration. She is a low surgical risk patient but is 74 years old and her husband is undergoing coronary bypass graft surgery and aortic valve replacement tomorrow and therefore she is not interested in having open surgical aortic valve replacement and wants to  pursue TAVR. I think TAVR is a reasonable alternative for her. Her gated cardiac CTA shows anatomy suitable for transcatheter aortic valve replacement using a 23 mm SAPIEN 3 valve or a 29 mm Medtronic Evolut Pro + valve.  Given her weight of 112 kg with a BMI of 41 and a BSA of 2.29 m I think it would be best to use the 29 mm Medtronic valve.  Her abdominal and pelvic CTA shows adequate pelvic vascular anatomy to allow transfemoral insertion.   The patient was counseled at length regarding treatment alternatives for management of severe symptomatic aortic stenosis. The risks and benefits of surgical intervention has been discussed in detail. Long-term prognosis with medical therapy was discussed. Alternative approaches such as conventional surgical aortic valve replacement, transcatheter aortic valve replacement, and palliative medical therapy were compared and contrasted at length. This discussion was placed in the context of the patient's own specific clinical presentation and past medical history. All of her questions have been addressed.   Following the decision to proceed with transcatheter aortic valve replacement, a discussion was held regarding what types of management strategies would be attempted intraoperatively in the event of life-threatening complications, including whether or not the patient would be considered a candidate for the use of cardiopulmonary bypass and/or conversion to open sternotomy for attempted surgical intervention. She is a low surgical risk patient and would be a candidate for emergent sternotomy if necessary to manage any intraoperative complications.   The patient has been advised of a variety of complications that might develop including but not limited to risks of death, stroke, paravalvular leak, aortic dissection or other major vascular complications, aortic annulus rupture, device embolization, cardiac rupture or perforation, mitral regurgitation, acute myocardial  infarction, arrhythmia, heart  block or bradycardia requiring permanent pacemaker placement, congestive heart failure, respiratory failure, renal failure, pneumonia, infection, other late complications related to structural valve deterioration or migration, or other complications that might ultimately cause a temporary or permanent loss of functional independence or other long term morbidity. The patient provides full informed consent for the procedure as described and all questions were answered.    DETAILS OF THE OPERATIVE PROCEDURE  PREPARATION:    The patient is brought to the operating room on the above mentioned date and central monitoring was established by the anesthesia team including placement of a  radial arterial line. The patient is placed in the supine position on the operating table.  Intravenous antibiotics are administered.  Conscious sedation was used by anesthesiology.   Baseline transthoracic echocardiogram was performed. The patient's  Abdomen and both groins are prepared and draped in a sterile manner. A time out procedure is performed.   PERIPHERAL ACCESS:    Using the modified Seldinger technique, femoral arterial and venous access was obtained with placement of 6 Fr sheaths on the left side.  A pigtail diagnostic catheter was passed through the left arterial sheath under fluoroscopic guidance into the aortic root.  A temporary transvenous pacemaker catheter was passed through the left femoral venous sheath under fluoroscopic guidance into the right ventricle.  The pacemaker was tested to ensure stable lead placement and pacemaker capture.   TRANSFEMORAL ACCESS:   Percutaneous transfemoral access and sheath placement was performed by using ultrasound guidance.  The left common femoral artery was cannulated using a micropuncture needle and appropriate location was verified using hand injection angiogram.  A pair of Abbott Perclose percutaneous closure devices were placed and  a 8 French sheath replaced into the femoral artery.  The patient was heparinized systemically and ACT verified > 250 seconds.    An 18 Fr Dry-Seal sheath was introduced into the left femoral artery after progressively dilating over an Amplatz superstiff wire. An AL-2 catheter was used to direct a straight-tip exchange length wire across the native aortic valve into the left ventricle. This was exchanged out for a pigtail catheter and position was confirmed in the LV apex. Simultaneous LV and Ao pressures were recorded.  The pigtail catheter was exchanged for a Confida wire in the LV apex.   BALLOON AORTIC VALVULOPLASTY:   BAV was performed using an 2 F True Balloon while rapid pacing.    TRANSCATHETER HEART VALVE DEPLOYMENT:   A Medtronic Evolut Pro +transcatheter heart valve (size 29 mm, serial [id number]) was prepared and crimped per manufacturer's guidelines, and the proper orientation of the valve is confirmed on the Medtronic  delivery system. The valve was advanced through the introducer sheath using normal technique until in an appropriate position in the abdominal aorta beyond the sheath tip. The valve was then advanced across the aortic arch. The valve was carefully positioned across the aortic valve annulus.   The valve is carefully deployed using the retractor dial so that 80% of the valve is released. Cusp overlap technique was used.  The valve was fully released during pacing at 120 bpm. There was felt to be mild paravalvular leak and no central aortic insufficiency. The patient's hemodynamic recovery following valve deployment is good.  The delivery catheter and guidewire were both removed.    PROCEDURE COMPLETION:   The sheath was removed and femoral artery closure performed using the previously placed Perclose devices.   Protamine was administered once femoral arterial repair was complete. The temporary pacemaker,  pigtail catheters and femoral sheaths were removed with manual pressure  used for hemostasis. A Mynx closure device was used for the left femoral artery.   The patient tolerated the procedure well and is transported to the cath lab recovery area in stable condition. There were no immediate intraoperative complications. All sponge instrument and needle counts are verified correct at completion of the operation.   No blood products were administered during the operation.  The patient received a total of 81 mL of intravenous contrast during the procedure.   Gaye Pollack, MD 05/28/2019

## 2019-05-28 NOTE — Progress Notes (Signed)
R radial 20 g. Arterial line was pulled, and manual pressure was held for 8 min. R wrist area is unremarkable with R radial pulse at 1 +. Capillary refill was < 3 sec.

## 2019-05-28 NOTE — Anesthesia Procedure Notes (Signed)
Procedure Name: MAC Date/Time: 05/28/2019 3:28 PM Performed by: Janace Litten, CRNA Pre-anesthesia Checklist: Patient identified, Emergency Drugs available, Suction available and Patient being monitored Patient Re-evaluated:Patient Re-evaluated prior to induction Oxygen Delivery Method: Simple face mask

## 2019-05-28 NOTE — Progress Notes (Signed)
  Echocardiogram 2D Echocardiogram has been performed.  Bobbye Charleston 05/28/2019, 5:25 PM

## 2019-05-29 ENCOUNTER — Inpatient Hospital Stay (HOSPITAL_COMMUNITY): Payer: Medicare Other

## 2019-05-29 ENCOUNTER — Encounter: Payer: Self-pay | Admitting: *Deleted

## 2019-05-29 DIAGNOSIS — Z952 Presence of prosthetic heart valve: Secondary | ICD-10-CM

## 2019-05-29 DIAGNOSIS — I5032 Chronic diastolic (congestive) heart failure: Secondary | ICD-10-CM

## 2019-05-29 LAB — CBC
HCT: 30.8 % — ABNORMAL LOW (ref 36.0–46.0)
Hemoglobin: 10.2 g/dL — ABNORMAL LOW (ref 12.0–15.0)
MCH: 30.8 pg (ref 26.0–34.0)
MCHC: 33.1 g/dL (ref 30.0–36.0)
MCV: 93.1 fL (ref 80.0–100.0)
Platelets: 122 10*3/uL — ABNORMAL LOW (ref 150–400)
RBC: 3.31 MIL/uL — ABNORMAL LOW (ref 3.87–5.11)
RDW: 12.2 % (ref 11.5–15.5)
WBC: 6 10*3/uL (ref 4.0–10.5)
nRBC: 0 % (ref 0.0–0.2)

## 2019-05-29 LAB — POCT I-STAT, CHEM 8
BUN: 17 mg/dL (ref 8–23)
BUN: 18 mg/dL (ref 8–23)
Calcium, Ion: 1.18 mmol/L (ref 1.15–1.40)
Calcium, Ion: 1.22 mmol/L (ref 1.15–1.40)
Chloride: 111 mmol/L (ref 98–111)
Chloride: 110 mmol/L (ref 98–111)
Creatinine, Ser: 0.8 mg/dL (ref 0.44–1.00)
Creatinine, Ser: 0.8 mg/dL (ref 0.44–1.00)
Glucose, Bld: 87 mg/dL (ref 70–99)
Glucose, Bld: 104 mg/dL — ABNORMAL HIGH (ref 70–99)
HCT: 28 % — ABNORMAL LOW (ref 36.0–46.0)
HCT: 27 % — ABNORMAL LOW (ref 36.0–46.0)
Hemoglobin: 9.5 g/dL — ABNORMAL LOW (ref 12.0–15.0)
Hemoglobin: 9.2 g/dL — ABNORMAL LOW (ref 12.0–15.0)
Potassium: 3.5 mmol/L (ref 3.5–5.1)
Potassium: 3.6 mmol/L (ref 3.5–5.1)
Sodium: 144 mmol/L (ref 135–145)
Sodium: 143 mmol/L (ref 135–145)
TCO2: 24 mmol/L (ref 22–32)
TCO2: 26 mmol/L (ref 22–32)

## 2019-05-29 LAB — BASIC METABOLIC PANEL
Anion gap: 11 (ref 5–15)
BUN: 15 mg/dL (ref 8–23)
CO2: 20 mmol/L — ABNORMAL LOW (ref 22–32)
Calcium: 8.2 mg/dL — ABNORMAL LOW (ref 8.9–10.3)
Chloride: 109 mmol/L (ref 98–111)
Creatinine, Ser: 0.82 mg/dL (ref 0.44–1.00)
GFR calc Af Amer: >60 mL/min (ref >60)
GFR calc non Af Amer: >60 mL/min (ref >60)
Glucose, Bld: 137 mg/dL — ABNORMAL HIGH (ref 70–99)
Potassium: 3.5 mmol/L (ref 3.5–5.1)
Sodium: 140 mmol/L (ref 135–145)

## 2019-05-29 LAB — MAGNESIUM: Magnesium: 1.9 mg/dL (ref 1.7–2.4)

## 2019-05-29 LAB — ECHOCARDIOGRAM COMPLETE
Height: 65 in
Weight: 3894.21 oz

## 2019-05-29 MED ORDER — PANTOPRAZOLE SODIUM 40 MG PO TBEC: 40.0000 mg | DELAYED_RELEASE_TABLET | Freq: Every day | ORAL | 1 refills | Status: DC

## 2019-05-29 MED ORDER — CLOPIDOGREL BISULFATE 75 MG PO TABS: 75.0000 mg | ORAL_TABLET | Freq: Every day | ORAL | 1 refills | Status: DC

## 2019-05-29 NOTE — Anesthesia Postprocedure Evaluation (Signed)
Anesthesia Post Note  Patient: Elizabeth [name]  Procedure(s) Performed: TRANSCATHETER AORTIC VALVE REPLACEMENT, TRANSFEMORAL (N/A Chest) Intraoperative Transthoracic Echocardiogram     Patient location during evaluation: Cath Lab Anesthesia Type: MAC Level of consciousness: awake and alert Pain management: pain level controlled Vital Signs Assessment: post-procedure vital signs reviewed and stable Respiratory status: spontaneous breathing, nonlabored ventilation, respiratory function stable and patient connected to nasal cannula oxygen Cardiovascular status: stable and blood pressure returned to baseline Postop Assessment: no apparent nausea or vomiting Anesthetic complications: no    Last Vitals:  Vitals:   05/29/19 0410 05/29/19 0829  BP: (!) 136/52 (!) 144/47  Pulse: 71 82  Resp: 19 19  Temp: 36.9 C 36.8 C  SpO2: 94% 98%    Last Pain:  Vitals:   05/29/19 0829  TempSrc: Oral  PainSc:                  Catalina Gravel

## 2019-05-29 NOTE — Progress Notes (Signed)
Discharge instructions provided to patient. All medications, appointments, and discharge instructions provided to patient. IV out. Monitor off CCMD notified. All questions answered. Patient is discharging to home.  Paulene Floor, RN

## 2019-05-29 NOTE — Progress Notes (Signed)
  Echocardiogram 2D Echocardiogram has been performed.  Elizabeth [name] 05/29/2019, 12:14 PM

## 2019-05-29 NOTE — Discharge Summary (Addendum)
[facility]VE TEAM  Discharge Summary    Patient ID: Elizabeth [name] MRN: [id number]; DOB: 1945-09-15  Admit date: 05/28/2019 Discharge date: 05/29/2019  Primary Care Provider: Marco Collie, MD  Primary Cardiologist: Dr. Bettina Gavia / Dr. Burt Knack & Dr. Cyndia Bent (TAVR)   Discharge Diagnoses    Principal Problem:   S/P TAVR (transcatheter aortic valve replacement) Active Problems:   Ductal carcinoma in situ (DCIS) of left breast   Chronic pain of both knees   Lumbar radiculopathy   Hypertension   Severe aortic stenosis   Chronic diastolic heart failure (HCC)   CKD (chronic kidney disease), stage III   Allergies Allergies  Allergen Reactions  . Penicillins Hives and Other (See Comments)    Has patient had a PCN reaction causing immediate rash, facial/tongue/throat swelling, SOB or lightheadedness with hypotension:    #  #  YES  #  #  Has patient had a PCN reaction causing severe rash involving mucus membranes or skin necrosis:    #  #  YES  #  #  Has patient had a PCN reaction that required hospitalization: No Has patient had a PCN reaction occurring within the last 10 years: No    . Codeine Nausea And Vomiting  . Lipitor [Atorvastatin] Itching and Other (See Comments)    Redness and itching all over body  . Prednisone Other (See Comments)    Red and hot all over     Diagnostic Studies/Procedures     TAVR OPERATIVE NOTE  Date of Procedure:                05/28/2019  Preoperative Diagnosis:      Severe Aortic Stenosis   Postoperative Diagnosis:    Same   Procedure:        Transcatheter Aortic Valve Replacement - Percutaneous Right Transfemoral Approach             Medtronic Evolut Pro +(size 29 mm,  serial # [id number])              Co-Surgeons:                        Gaye Pollack, MD and Sherren Mocha, MD   Anesthesiologist:                  Collins Scotland, MD  Echocardiographer:              Liane Comber,  MD  Pre-operative Echo Findings: ? Severe aortic stenosis ? Normal left ventricular systolic function  Post-operative Echo Findings: ? Mild paravalvular leak ? Normal left ventricular systolic function  _____________   Echo 05/29/19: complete but pending formal read at the time of discharge    History of Present Illness     Elizabeth [name] is a 74 y.o. female with a history of HTN, CKD stage II, obesity, chronic diastolic CHF, recent presumed Covid 19 infection and severe AS who presented to [facility] on 05/28/19 for planned TAVR.   She was admitted with congestive heart failure in June 2020 and had an echocardiogram performed which showed an ejection fraction of 55 to 60%. This reportedly showed a bicuspid aortic valve with severe thickening and moderate calcification and a peak velocity of 4.2 m/s with an aortic valve area of 0.6 to 0.7 cm consistent with severe aortic stenosis. She had a follow-up echocardiogram on 12/31/2018 which showed an aortic valve mean gradient of  49 mmHg with a peak gradient of 69 mmHg and an aortic valve area by VTI measuring 0.58 cm.  Left ventricular ejection fraction was 55 to 60%.  She was seen by Dr. Bettina Gavia in November 2020 and referred for cardiac catheterization which was performed on 03/03/2019.  This showed nonobstructive coronary disease and a mean gradient of 43 mmHg across the aortic valve consistent with severe aortic stenosis.  Right heart pressures were within normal limits.  She was scheduled for TAVR work-up and plan potential surgery in mid January but had to delay her appointment so that she could have a noninvasive knee procedure. She decided that she did not want to cancel the knee procedure due to the amount of pain that she was having. Then she became very ill with suspected COVID-19 infection along with multiple other family members. She lost over 25 pounds and was very weak and fatigued.  The patient has been evaluated by the  multidisciplinary valve team and felt to have severe, symptomatic aortic stenosis and to be a suitable candidate for TAVR, which was set up for 05/28/19.  Hospital Course     Consultants: none  Severe AS: s/p successful TAVR with a 29 mm Medtronic Evolut Pro + THV via the TF approach on 05/28/19. Post operative echo pending. Groin sites are stable. ECG with sinus and no high grade heart block. Started on Asprin and Plavix. Plan for discharge home today with close follow up in the office next week.   GERD: Prilosec was changed to Protonix given potential drug drug interaction with Plavix.  Pulmonary nodules: multiple small pulmonary nodules measuring 5 mm or less in size, nonspecific, but statistically likely benign. No follow-up needed if patient is low-risk (and has no known or suspected primary neoplasm). Non-contrast chest CT can be considered in 12 months if patient is high-risk. Will discuss in the outpatient setting.   _____________  Discharge Vitals Blood pressure (!) 144/47, pulse 82, temperature 98.2 F (36.8 C), temperature source Oral, resp. rate 19, height 5\' 5"  (1.651 m), weight 110.4 kg, SpO2 98 %.  Filed Weights   05/28/19 1151 05/28/19 2000 05/29/19 0410  Weight: 108.9 kg 110.6 kg 110.4 kg     GEN: Well nourished, well developed, in no acute distress, obese HEENT: normal Neck: no JVD or masses Cardiac: RRR; soft flow murmur. No diastolic murmur. No rubs, or gallops,no edema  Respiratory:  clear to auscultation bilaterally, normal work of breathing GI: soft, nontender, nondistended, + BS MS: no deformity or atrophy Skin: warm and dry, no rash.  Groin sites clear without hematoma or ecchymosis  Neuro:  Alert and Oriented x 3, Strength and sensation are intact Psych: euthymic mood, full affect   Labs & Radiologic Studies    CBC Recent Labs    05/28/19 1755 05/29/19 0350  WBC  --  6.0  HGB 9.5* 10.2*  HCT 28.0* 30.8*  MCV  --  93.1  PLT  --  122*   Basic  Metabolic Panel Recent Labs    05/28/19 1755 05/29/19 0350  NA 144 140  K 3.6 3.5  CL 110 109  CO2  --  20*  GLUCOSE 108* 137*  BUN 17 15  CREATININE 0.80 0.82  CALCIUM  --  8.2*  MG  --  1.9   Liver Function Tests No results for input(s): AST, ALT, ALKPHOS, BILITOT, PROT, ALBUMIN in the last 72 hours. No results for input(s): LIPASE, AMYLASE in the last 72 hours. Cardiac Enzymes No  results for input(s): CKTOTAL, CKMB, CKMBINDEX, TROPONINI in the last 72 hours. BNP Invalid input(s): POCBNP D-Dimer No results for input(s): DDIMER in the last 72 hours. Hemoglobin A1C No results for input(s): HGBA1C in the last 72 hours. Fasting Lipid Panel No results for input(s): CHOL, HDL, LDLCALC, TRIG, CHOLHDL, LDLDIRECT in the last 72 hours. Thyroid Function Tests No results for input(s): TSH, T4TOTAL, T3FREE, THYROIDAB in the last 72 hours.  Invalid input(s): FREET3 _____________  DG Chest 2 View  Result Date: 05/24/2019 CLINICAL DATA:  Preoperative exam for aortic valve replacement, history of severe aortic stenosis EXAM: CHEST - 2 VIEW COMPARISON:  Radiograph 09/08/2018, CT 05/14/2019 FINDINGS: No consolidation, features of edema, pneumothorax, or effusion. Double density sign along the right heart border compatible with the left atrial enlargement better seen on comparison CT. Few coronary artery calcifications are present. Atherosclerotic plaque within the normal caliber aorta. No acute osseous or soft tissue abnormality. Degenerative changes are present in the imaged spine and shoulders. IMPRESSION: 1. No acute cardiopulmonary findings. 2. Left atrial enlargement. 3. Coronary artery calcifications the. 4.  Aortic Atherosclerosis (ICD10-I70.0). Electronically Signed   By: Lovena Le M.D.   On: 05/24/2019 23:56   IR US Guide Vasc Access Right  Result Date: 05/14/2019 INDICATION: 74 year old female with a history of poor venous access and need for contrast-enhanced CT. EXAM: IMAGE GUIDED  PLACEMENT OF PERIPHERAL IV MEDICATIONS: None ANESTHESIA/SEDATION: None FLUOROSCOPY TIME:  None COMPLICATIONS: None PROCEDURE: Informed written consent was obtained from the patient after a thorough discussion of the procedural risks, benefits and alternatives. All questions were addressed. Sterile Barrier Technique was utilized including caps, mask, sterile gloves, sterile drape, hand hygiene and skin antiseptic. A timeout was performed prior to the initiation of the procedure. Ultrasound survey of the upper extremity was performed with images stored and sent to PACs. Of note, the patient has very attenuated venous structures of the right upper extremity, with non-existent right cephalic vein, very small right basilic vein, and very small brachial veins. The diameter of the veins are significantly decreased when compared to the diameter of the arm. A micropuncture needle was ultimately used access the proximal right basilic under ultrasound. With venous blood flow returned, an .018 micro wire was passed through the needle into the vein. The needle was removed, and a micropuncture sheath was placed over the wire. Failure of distal access of the brachial vein as well as distal basilic vein before successful puncture. The inner dilator and wire were removed, and the catheter was secured in position after attaching to saline flush. Patient tolerated the procedure well and remained hemodynamically stable throughout. No complications were encountered and no significant blood loss. IMPRESSION: Ultrasound-guided then a puncture of the right brachial vein. Signed, Dulcy Fanny. Dellia Nims, RPVI [facility] Electronically Signed   By: Corrie Mckusick D.O.   On: 05/14/2019 12:21   CT CORONARY MORPH W/CTA COR W/SCORE W/CA W/CM &/OR WO/CM  Addendum Date: 05/14/2019   ADDENDUM REPORT: 05/14/2019 17:18 CLINICAL DATA:  52 -year-old female with severe aortic stenosis being  evaluated for a TAVR procedure. EXAM: Cardiac TAVR CT TECHNIQUE: The patient was scanned on a [employer]. A 120 kV retrospective scan was triggered in the descending thoracic aorta at 111 HU's. Gantry rotation speed was 250 msecs and collimation was .6 mm. No beta blockade or nitro were given. The 3D data set was reconstructed in 5% intervals of the R-R cycle. Systolic and diastolic phases were  analyzed on a dedicated work station using MPR, MIP and VRT modes. The patient received 80 cc of contrast. FINDINGS: Aortic Root: Aortic valve: Tricuspid Aortic valve calcium score: 2715 Aortic annulus: Diameter: 10mm x 23mm Perimeter: 35mm Area: 423 mm^2 Calcifications: No calcifications Coronary height: Min Left -71mm , Max Left -63mm ; Min Right - 16mm Sinotubular height: Left cusp -41mm ; Right cusp - 6mm; Noncoronary cusp - 69mm LVOT (as measured 3 mm below the annulus): Diameter: 71mm x 4mm Area: 341mm^2 Calcifications: Moderate LVOT calcification located 50mm inferior to annulus, beneath noncoronary cusp Aortic sinus width: Left cusp - 43mm; Right cusp - 36mm; Noncoronary cusp -69mm; Sinotubular junction width: 33mm x 77mm Optimum Fluoroscopic Angle for Delivery: LAO 3 CRA 21 Cardiac: Right atrium: Normal size Right ventricle: Mildly dilated Pulmonary arteries: Mildly dilated, measures 84mm in main PA Pulmonary veins: Normal configuration Left atrium: Moderately dilated Left ventricle: Moderately dilated Pericardium: Normal thickness Coronary arteries: Calcium score 494 (88th percentile) IMPRESSION: 1.  Tricuspid aortic valve, severely calcified (calcium score 2715) 2. Aortic annulus measures 58mm x 49mm in diameter with area 423 mm^2 and perimeter 49mm. No annular calcifications. Annular measurements suitable for delivery of 84mm Edwards-Sapien 3 valve 3. Moderate LVOT calcification located 65mm inferior to annulus, beneath noncoronary cusp 4. Sufficient coronary to annulus distance, measures 36mm from  left main to annulus, and 13mm from RCA to annulus 5. Optimum Fluoroscopic Angle for Delivery (centered on right coronary cusp): LAO 3 CRA 21 6.  Coronary calcium score 494 (88th percentile) Electronically Signed   By: Oswaldo Milian MD   On: 05/14/2019 17:18   Result Date: 05/14/2019 EXAM: OVER-READ INTERPRETATION  CT CHEST The following report is an over-read performed by radiologist Dr. Vinnie Langton of [facility], [place] on 05/14/2019. This over-read does not include interpretation of cardiac or coronary anatomy or pathology. The coronary calcium score/coronary CTA interpretation by the cardiologist is attached. COMPARISON:  None. FINDINGS: Extracardiac findings will be described separately under dictation for contemporaneously obtained CTA chest, abdomen and pelvis. IMPRESSION: Please see separate dictation for contemporaneously obtained CTA chest, abdomen and pelvis 05/14/2019 for full description of relevant extracardiac findings. Electronically Signed: By: Vinnie Langton M.D. On: 05/14/2019 10:56   CT ANGIO CHEST AORTA W &/OR WO CONTRAST  Result Date: 05/14/2019 CLINICAL DATA:  74 year old female with history of severe aortic stenosis. Preprocedural study prior to potential transcatheter aortic valve replacement (TAVR) procedure. EXAM: CT ANGIOGRAPHY CHEST, ABDOMEN AND PELVIS TECHNIQUE: Multidetector CT imaging through the chest, abdomen and pelvis was performed using the standard protocol during bolus administration of intravenous contrast. Multiplanar reconstructed images and MIPs were obtained and reviewed to evaluate the vascular anatomy. CONTRAST:  167mL OMNIPAQUE IOHEXOL 350 MG/ML SOLN COMPARISON:  None. FINDINGS: CTA CHEST FINDINGS Cardiovascular: Heart size is mildly enlarged. There is no significant pericardial fluid, thickening or pericardial calcification. There is aortic atherosclerosis, as well as atherosclerosis of the great vessels of the mediastinum and the coronary  arteries, including calcified atherosclerotic plaque in the left main, left anterior descending, left circumflex and right coronary arteries. Severe thickening and calcification of the aortic valve. Calcifications of the mitral annulus. Dilatation of the pulmonic trunk (3.7 cm in diameter). Mediastinum/Lymph Nodes: No pathologically enlarged mediastinal or hilar lymph nodes. Esophagus is unremarkable in appearance. No axillary lymphadenopathy. Lungs/Pleura: No acute consolidative airspace disease. No pleural effusions. A few scattered tiny pulmonary nodules are noted in the lungs bilaterally measuring 5 mm or less in size. No other larger more suspicious  appearing pulmonary nodules or masses are noted. Musculoskeletal/Soft Tissues: There are no aggressive appearing lytic or blastic lesions noted in the visualized portions of the skeleton. CTA ABDOMEN AND PELVIS FINDINGS Hepatobiliary: Diffuse low attenuation throughout the hepatic parenchyma, indicative of hepatic steatosis. No suspicious cystic or solid hepatic lesions. No intra or extrahepatic biliary ductal dilatation. Status post cholecystectomy. Pancreas: No pancreatic mass. No pancreatic ductal dilatation. No pancreatic or peripancreatic fluid collections or inflammatory changes. Spleen: Unremarkable. Adrenals/Urinary Tract: 4.2 cm low-attenuation lesion in the anterior aspect of the interpolar region of the right kidney, compatible with a simple cyst. Other subcentimeter low-attenuation lesions in both kidneys, too small to definitively characterize, but statistically likely to represent tiny cysts. 8 mm nonobstructive calculus in the lower pole collecting system of the right kidney. No hydroureteronephrosis. Urinary bladder is normal in appearance. Bilateral adrenal glands are normal in appearance. Stomach/Bowel: Normal appearance of the stomach. No pathologic dilatation of small bowel or colon. Status post appendectomy. Vascular/Lymphatic: Vascular findings  and measurements pertinent to potential TAVR procedure, as detailed below. Aortic atherosclerosis, without evidence of aneurysm or dissection in the abdominal or pelvic vasculature. No lymphadenopathy noted in the abdomen or pelvis. Reproductive: Status post hysterectomy. Ovaries are not confidently identified may be surgically absent or atrophic. Other: No significant volume of ascites.  No pneumoperitoneum. Musculoskeletal: Bilateral pars defects at L4 with 4 mm of anterolisthesis of L4 upon L5. There are no aggressive appearing lytic or blastic lesions noted in the visualized portions of the skeleton. VASCULAR MEASUREMENTS PERTINENT TO TAVR: AORTA: Minimal Aortic Diameter-13 x 13 mm Severity of Aortic Calcification-moderate RIGHT PELVIS: Right Common Iliac Artery - Minimal Diameter-10.2 x 10.3 mm Tortuosity-mild Calcification-mild Right External Iliac Artery - Minimal Diameter-7.2 x 7.6 mm Tortuosity-severe Calcification-none Right Common Femoral Artery - Minimal Diameter-7.0 x 7.0 mm Tortuosity-mild Calcification-none LEFT PELVIS: Left Common Iliac Artery - Minimal Diameter-9.4 x 10.1 mm Tortuosity-mild Calcification-mild Left External Iliac Artery - Minimal Diameter-7.9 x 8.2 mm Tortuosity-moderate to severe Calcification-none Left Common Femoral Artery - Minimal Diameter-7.7 x 7.9 mm Tortuosity-mild Calcification-none Review of the MIP images confirms the above findings. IMPRESSION: 1. Vascular findings and measurements pertinent to potential TAVR procedure, as detailed above. 2. Severe thickening calcification of the aortic valve, compatible with reported clinical history of severe aortic stenosis. 3. Dilatation of the pulmonic trunk (3.7 cm in diameter), concerning for pulmonary arterial hypertension. 4. Calcifications of the mitral annulus. 5. Mild cardiomegaly. 6. Multiple small pulmonary nodules measuring 5 mm or less in size, nonspecific, but statistically likely benign. No follow-up needed if patient is  low-risk (and has no known or suspected primary neoplasm). Non-contrast chest CT can be considered in 12 months if patient is high-risk. This recommendation follows the consensus statement: Guidelines for Management of Incidental Pulmonary Nodules Detected on CT Images: From the Fleischner Society 2017; Radiology 2017; 284:228-243. 7. 8 mm nonobstructive calculus in the lower pole collecting system of the right kidney. 8. Additional incidental findings, as above. 9. Electronically Signed   By: Vinnie Langton M.D.   On: 05/14/2019 11:44   IR RADIOLOGY PERIPHERAL GUIDED IV START  Result Date: 05/14/2019 INDICATION: 74 year old female with a history of poor venous access and need for contrast-enhanced CT. EXAM: IMAGE GUIDED PLACEMENT OF PERIPHERAL IV MEDICATIONS: None ANESTHESIA/SEDATION: None FLUOROSCOPY TIME:  None COMPLICATIONS: None PROCEDURE: Informed written consent was obtained from the patient after a thorough discussion of the procedural risks, benefits and alternatives. All questions were addressed. Sterile Barrier Technique was utilized including caps, mask,  sterile gloves, sterile drape, hand hygiene and skin antiseptic. A timeout was performed prior to the initiation of the procedure. Ultrasound survey of the upper extremity was performed with images stored and sent to PACs. Of note, the patient has very attenuated venous structures of the right upper extremity, with non-existent right cephalic vein, very small right basilic vein, and very small brachial veins. The diameter of the veins are significantly decreased when compared to the diameter of the arm. A micropuncture needle was ultimately used access the proximal right basilic under ultrasound. With venous blood flow returned, an .018 micro wire was passed through the needle into the vein. The needle was removed, and a micropuncture sheath was placed over the wire. Failure of distal access of the brachial vein as well as distal basilic vein before  successful puncture. The inner dilator and wire were removed, and the catheter was secured in position after attaching to saline flush. Patient tolerated the procedure well and remained hemodynamically stable throughout. No complications were encountered and no significant blood loss. IMPRESSION: Ultrasound-guided then a puncture of the right brachial vein. Signed, Dulcy Fanny. Dellia Nims, RPVI [facility] Radiology Electronically Signed   By: Corrie Mckusick D.O.   On: 05/14/2019 12:21   VAS US CAROTID  Result Date: 05/13/2019 Carotid Arterial Duplex Study Indications:       Pre-surgical evaluation. Other Factors:     Aortic valve disease. Comparison Study:  No prior study. Performing Technologist: Maudry Mayhew MHA, RDMS, RVT, RDCS  Examination Guidelines: A complete evaluation includes B-mode imaging, spectral Doppler, color Doppler, and power Doppler as needed of all accessible portions of each vessel. Bilateral testing is considered an integral part of a complete examination. Limited examinations for reoccurring indications may be performed as noted.  Right Carotid Findings: +----------+--------+--------+--------+--------------------------+--------+           PSV cm/sEDV cm/sStenosisPlaque Description        Comments +----------+--------+--------+--------+--------------------------+--------+ CCA Prox  67      14                                                 +----------+--------+--------+--------+--------------------------+--------+ CCA Distal60      14              irregular and heterogenous         +----------+--------+--------+--------+--------------------------+--------+ ICA Prox  56      15              smooth and heterogenous            +----------+--------+--------+--------+--------------------------+--------+ ICA Distal139     33                                        tortuous  +----------+--------+--------+--------+--------------------------+--------+ ECA       64      7                                                  +----------+--------+--------+--------+--------------------------+--------+ +----------+--------+-------+----------------+-------------------+           PSV cm/sEDV cmsDescribe        Arm  Pressure (mmHG) +----------+--------+-------+----------------+-------------------+ [id number]            Multiphasic, WNL                    +----------+--------+-------+----------------+-------------------+ +---------+--------+--+--------+--+---------+ VertebralPSV cm/s58EDV cm/s18Antegrade +---------+--------+--+--------+--+---------+  Left Carotid Findings: +----------+--------+--------+--------+-----------------------+--------+           PSV cm/sEDV cm/sStenosisPlaque Description     Comments +----------+--------+--------+--------+-----------------------+--------+ CCA Prox  140     13                                              +----------+--------+--------+--------+-----------------------+--------+ CCA Distal82      20                                              +----------+--------+--------+--------+-----------------------+--------+ ICA Prox  71      13              smooth and heterogenous         +----------+--------+--------+--------+-----------------------+--------+ ICA Distal64      18                                              +----------+--------+--------+--------+-----------------------+--------+ ECA       75      11              smooth and homogeneous          +----------+--------+--------+--------+-----------------------+--------+ +----------+--------+--------+----------------+-------------------+           PSV cm/sEDV cm/sDescribe        Arm Pressure (mmHG) +----------+--------+--------+----------------+-------------------+ [id number]             Multiphasic, WNL                     +----------+--------+--------+----------------+-------------------+ +---------+--------+--+--------+--+---------+ VertebralPSV cm/s55EDV cm/s16Antegrade +---------+--------+--+--------+--+---------+   Summary: Right Carotid: Velocities in the right ICA are consistent with a 1-39% stenosis. Left Carotid: Velocities in the left ICA are consistent with a 1-39% stenosis. Vertebrals:  Bilateral vertebral arteries demonstrate antegrade flow. Subclavians: Normal flow hemodynamics were seen in bilateral subclavian              arteries. *See table(s) above for measurements and observations.  Electronically signed by Ruta Hinds MD on 05/13/2019 at 5:58:09 PM.    Final    Structural Heart Procedure  Result Date: 05/28/2019 See surgical note for result.  CT Angio Abd/Pel w/ and/or w/o  Result Date: 05/14/2019 CLINICAL DATA:  74 year old female with history of severe aortic stenosis. Preprocedural study prior to potential transcatheter aortic valve replacement (TAVR) procedure. EXAM: CT ANGIOGRAPHY CHEST, ABDOMEN AND PELVIS TECHNIQUE: Multidetector CT imaging through the chest, abdomen and pelvis was performed using the standard protocol during bolus administration of intravenous contrast. Multiplanar reconstructed images and MIPs were obtained and reviewed to evaluate the vascular anatomy. CONTRAST:  18mL OMNIPAQUE IOHEXOL 350 MG/ML SOLN COMPARISON:  None. FINDINGS: CTA CHEST FINDINGS Cardiovascular: Heart size is mildly enlarged. There is no significant pericardial fluid, thickening or pericardial calcification. There is aortic atherosclerosis, as well as atherosclerosis of the great vessels of the mediastinum and the coronary arteries, including calcified atherosclerotic plaque in  the left main, left anterior descending, left circumflex and right coronary arteries. Severe thickening and calcification of the aortic valve. Calcifications of the mitral annulus. Dilatation of the pulmonic trunk (3.7 cm in  diameter). Mediastinum/Lymph Nodes: No pathologically enlarged mediastinal or hilar lymph nodes. Esophagus is unremarkable in appearance. No axillary lymphadenopathy. Lungs/Pleura: No acute consolidative airspace disease. No pleural effusions. A few scattered tiny pulmonary nodules are noted in the lungs bilaterally measuring 5 mm or less in size. No other larger more suspicious appearing pulmonary nodules or masses are noted. Musculoskeletal/Soft Tissues: There are no aggressive appearing lytic or blastic lesions noted in the visualized portions of the skeleton. CTA ABDOMEN AND PELVIS FINDINGS Hepatobiliary: Diffuse low attenuation throughout the hepatic parenchyma, indicative of hepatic steatosis. No suspicious cystic or solid hepatic lesions. No intra or extrahepatic biliary ductal dilatation. Status post cholecystectomy. Pancreas: No pancreatic mass. No pancreatic ductal dilatation. No pancreatic or peripancreatic fluid collections or inflammatory changes. Spleen: Unremarkable. Adrenals/Urinary Tract: 4.2 cm low-attenuation lesion in the anterior aspect of the interpolar region of the right kidney, compatible with a simple cyst. Other subcentimeter low-attenuation lesions in both kidneys, too small to definitively characterize, but statistically likely to represent tiny cysts. 8 mm nonobstructive calculus in the lower pole collecting system of the right kidney. No hydroureteronephrosis. Urinary bladder is normal in appearance. Bilateral adrenal glands are normal in appearance. Stomach/Bowel: Normal appearance of the stomach. No pathologic dilatation of small bowel or colon. Status post appendectomy. Vascular/Lymphatic: Vascular findings and measurements pertinent to potential TAVR procedure, as detailed below. Aortic atherosclerosis, without evidence of aneurysm or dissection in the abdominal or pelvic vasculature. No lymphadenopathy noted in the abdomen or pelvis. Reproductive: Status post hysterectomy. Ovaries  are not confidently identified may be surgically absent or atrophic. Other: No significant volume of ascites.  No pneumoperitoneum. Musculoskeletal: Bilateral pars defects at L4 with 4 mm of anterolisthesis of L4 upon L5. There are no aggressive appearing lytic or blastic lesions noted in the visualized portions of the skeleton. VASCULAR MEASUREMENTS PERTINENT TO TAVR: AORTA: Minimal Aortic Diameter-13 x 13 mm Severity of Aortic Calcification-moderate RIGHT PELVIS: Right Common Iliac Artery - Minimal Diameter-10.2 x 10.3 mm Tortuosity-mild Calcification-mild Right External Iliac Artery - Minimal Diameter-7.2 x 7.6 mm Tortuosity-severe Calcification-none Right Common Femoral Artery - Minimal Diameter-7.0 x 7.0 mm Tortuosity-mild Calcification-none LEFT PELVIS: Left Common Iliac Artery - Minimal Diameter-9.4 x 10.1 mm Tortuosity-mild Calcification-mild Left External Iliac Artery - Minimal Diameter-7.9 x 8.2 mm Tortuosity-moderate to severe Calcification-none Left Common Femoral Artery - Minimal Diameter-7.7 x 7.9 mm Tortuosity-mild Calcification-none Review of the MIP images confirms the above findings. IMPRESSION: 1. Vascular findings and measurements pertinent to potential TAVR procedure, as detailed above. 2. Severe thickening calcification of the aortic valve, compatible with reported clinical history of severe aortic stenosis. 3. Dilatation of the pulmonic trunk (3.7 cm in diameter), concerning for pulmonary arterial hypertension. 4. Calcifications of the mitral annulus. 5. Mild cardiomegaly. 6. Multiple small pulmonary nodules measuring 5 mm or less in size, nonspecific, but statistically likely benign. No follow-up needed if patient is low-risk (and has no known or suspected primary neoplasm). Non-contrast chest CT can be considered in 12 months if patient is high-risk. This recommendation follows the consensus statement: Guidelines for Management of Incidental Pulmonary Nodules Detected on CT Images: From the  Fleischner Society 2017; Radiology 2017; 284:228-243. 7. 8 mm nonobstructive calculus in the lower pole collecting system of the right kidney. 8. Additional incidental findings, as above. 9. Electronically Signed  By: Vinnie Langton M.D.   On: 05/14/2019 11:44   Disposition   Pt is being discharged home today in good condition.  Follow-up Plans & Appointments    Follow-up Information    Eileen Stanford, PA-C. Go on 06/05/2019.   Specialties: Cardiology, Radiology Why: @ 4pm, please arrive at least 10 minutes early.  Contact information: [place] [id number] [phone]          Discharge Instructions    Amb Referral to Cardiac Rehabilitation   Complete by: As directed    To [place]   Diagnosis: Valve Replacement   Valve: Aortic Comment - TAVR   After initial evaluation and assessments completed: Virtual Based Care may be provided alone or in conjunction with Phase 2 Cardiac Rehab based on patient barriers.: Yes      Discharge Medications   Allergies as of 05/29/2019      Reactions   Penicillins Hives, Other (See Comments)   Has patient had a PCN reaction causing immediate rash, facial/tongue/throat swelling, SOB or lightheadedness with hypotension:    #  #  YES  #  #  Has patient had a PCN reaction causing severe rash involving mucus membranes or skin necrosis:    #  #  YES  #  #  Has patient had a PCN reaction that required hospitalization: No Has patient had a PCN reaction occurring within the last 10 years: No   Codeine Nausea And Vomiting   Lipitor [atorvastatin] Itching, Other (See Comments)   Redness and itching all over body   Prednisone Other (See Comments)   Red and hot all over       Medication List    STOP taking these medications   ibuprofen 800 MG tablet Commonly known as: ADVIL   omeprazole 40 MG capsule Commonly known as: PRILOSEC Replaced by: pantoprazole 40 MG tablet     TAKE these medications     amLODipine 5 MG tablet Commonly known as: NORVASC Take 1 tablet (5 mg total) by mouth daily.   carvedilol 6.25 MG tablet Commonly known as: COREG Take 6.25 mg by mouth 2 (two) times daily with a meal.   clopidogrel 75 MG tablet Commonly known as: PLAVIX Take 1 tablet (75 mg total) by mouth daily with breakfast. Start taking on: May 30, 2019   fexofenadine 180 MG tablet Commonly known as: ALLEGRA Take 180 mg by mouth daily as needed (allergies ([place] only)).   furosemide 20 MG tablet Commonly known as: LASIX Take 1 tablet (20 mg total) by mouth daily.   lisinopril 10 MG tablet Commonly known as: ZESTRIL Take 10 mg by mouth daily.   pantoprazole 40 MG tablet Commonly known as: PROTONIX Take 1 tablet (40 mg total) by mouth daily. Start taking on: May 30, 2019 Replaces: omeprazole 40 MG capsule   tamoxifen 20 MG tablet Commonly known as: NOLVADEX TAKE 1 TABLET(20 MG) BY MOUTH DAILY What changed: See the new instructions.           Outstanding Labs/Studies   none  Duration of Discharge Encounter   Greater than 30 minutes including physician time.  Signed, Angelena Form, PA-C   Patient seen, examined. Available data reviewed. Agree with findings, assessment, and plan as outlined by Nell Range, PA-C.  The patient is independently interviewed and examined.  She is alert, oriented, in no distress.  Lungs are clear, heart is regular rate and rhythm with a grade 2/6 systolic ejection  murmur at the right upper sternal border.  There is no diastolic murmur.  Abdomen is soft and nontender.  Bilateral groin sites are clear.  There is trace pretibial edema.  I have personally reviewed the patient's telemetry which shows normal sinus rhythm with some artifact.  There is no AV block present.  The patient's echo images are reviewed.  She has vigorous LV function.  The patient's TAVR valve has normal/expected gradients.  The highest mean gradient that I see is 19  mmHg, but this is a post PVC beat.  Her gradients on average are about 13 to 15 mmHg.  There is mild paravalvular aortic insufficiency.  Overall I think she is doing very well and stable for discharge today with follow-up plans outlined above.  Sherren Mocha, M.D. 05/29/2019 12:32 PM   05/29/2019, 11:35 AM [phone]

## 2019-05-29 NOTE — Progress Notes (Signed)
CARDIAC REHAB PHASE I   PRE:  Rate/Rhythm: 58 SR with PVCs    BP: sitting 147/62    SaO2: 97 RA  MODE:  Ambulation: 160 ft   POST:  Rate/Rhythm: 100 ST with PVCs    BP: sitting 154/59     SaO2: 96 RA  Pt able to get up and walk slowly, min assist. She is SOB but sts it is much improved and that she could not have gone this far PTA. Monitor showing Vtach but appears to be artifact while walking. Every time she stopped, rhythm calmed down, ST with PVCs. Discussed walking, restrictions, and CRPII. Voiced understanding. Will refer to [facility].  [id number]   [place], ACSM 05/29/2019 11:28 AM

## 2019-05-29 NOTE — Discharge Instructions (Signed)
Your heartburn medication ( Prilosec ) was changed to Protonix while you are on plavix to avoid a potential drug drug interaction with Prilosec and Plavix. When you discontinue Plavix after 6 months, you can resume your Prilosec.  ACTIVITY AND EXERCISE . Daily activity and exercise are an important part of your recovery. People recover at different rates depending on their general health and type of valve procedure. . Most people recovering from TAVR feel better relatively quickly  . No lifting, pushing, pulling more than 10 pounds (examples to avoid: groceries, vacuuming, gardening, golfing):             - For one week with a procedure through the groin.             - For six weeks for procedures through the chest wall or neck. NOTE: You will typically see one of our providers 7-14 days after your procedure to discuss [place] the above activities.      DRIVING . Do not drive until you are seen for follow up and cleared by a provider. Generally, we ask patient to not drive for 1 week after their procedure. . If you have been told by your doctor in the past that you may not drive, you must talk with him/her before you begin driving again.   DRESSING . Groin site: you may leave the clear dressing over the site for up to one week or until it falls off.   HYGIENE . If you had a femoral (leg) procedure, you may take a shower when you return home. After the shower, pat the site dry. Do NOT use powder, oils or lotions in your groin area until the site has completely healed. . If you had a chest procedure, you may shower when you return home unless specifically instructed not to by your discharging practitioner.             - DO NOT scrub incision; pat dry with a towel.             - DO NOT apply any lotions, oils, powders to the incision.             - No tub baths / swimming for at least 2 weeks. . If you notice any fevers, chills, increased pain, swelling, bleeding or pus, please contact  your doctor.   ADDITIONAL INFORMATION . If you are going to have an upcoming dental procedure, please contact our office as you will require antibiotics ahead of time to prevent infection on your heart valve.    If you have any questions or concerns you can call the structural heart phone during normal business hours 8am-4pm. If you have an urgent need after hours or weekends please call [phone] to talk to the on call provider for general cardiology. If you have an emergency that requires immediate attention, please call 911.    After TAVR Checklist  Check  Test Description   Follow up appointment in 1-2 weeks  You will see our structural heart physician assistant, Nell Range. Your incision sites will be checked and you will be cleared to drive and resume all normal activities if you are doing well.     1 month echo and follow up  You will have an echo to check on your new heart valve and be seen back in the office by Nell Range. Many times the echo is not read by your appointment time, but Joellen Jersey will call you later that day  or the following day to report your results.   Follow up with your primary cardiologist You will need to be seen by your primary cardiologist in the following 3-6 months after your 1 month appointment in the valve clinic. Often times your Plavix or Aspirin will be discontinued during this time, but this is decided on a case by case basis.    1 year echo and follow up You will have another echo to check on your heart valve after 1 year and be seen back in the office by Nell Range. This your last structural heart visit.   Bacterial endocarditis prophylaxis  You will have to take antibiotics for the rest of your life before all dental procedures (even teeth cleanings) to protect your heart valve. Antibiotics are also required before some surgeries. Please check with your cardiologist before scheduling any surgeries. Also, please make sure to tell us if you have a  penicillin allergy as you will require an alternative antibiotic.

## 2019-05-29 NOTE — Op Note (Addendum)
HEART AND VASCULAR CENTER   MULTIDISCIPLINARY HEART VALVE TEAM   TAVR OPERATIVE NOTE   Date of Procedure:  05/28/2019  Preoperative Diagnosis: Severe Aortic Stenosis   Postoperative Diagnosis: Same   Procedure:    Transcatheter Aortic Valve Replacement - Percutaneous Transfemoral Approach  Medtronic Evolut Pro Plus (29 mm valve, Serial [id number]   Co-Surgeons:  Gaye Pollack, MD and Sherren Mocha, MD  Anesthesiologist:  Dr Gifford Shave  Echocardiographer:  Dr Meda Coffee  Pre-operative Echo Findings:  severe aortic stenosis  vigorous left ventricular systolic function  Post-operative Echo Findings:  Mild paravalvular leak  unchanged left ventricular systolic function  BRIEF CLINICAL NOTE AND INDICATIONS FOR SURGERY  74 year old woman with severe, stage D aortic stenosis and New York Heart Association functional class III symptoms of chronic diastolic heart failure.  She presents today for TAVR after undergoing multidisciplinary heart team review.  During the course of the patient's preoperative work up they have been evaluated comprehensively by a multidisciplinary team of specialists coordinated through the [facility] in the [facility].  They have been demonstrated to suffer from symptomatic severe aortic stenosis as noted above. The patient has been counseled extensively as to the relative risks and benefits of all options for the treatment of severe aortic stenosis including long term medical therapy, conventional surgery for aortic valve replacement, and transcatheter aortic valve replacement.  The patient has been independently evaluated in formal cardiac surgical consultation by Dr Cyndia Bent, who deemed the patient appropriate for TAVR. Based upon review of all of the patient's preoperative diagnostic tests they are felt to be candidate for transcatheter aortic valve replacement using the transfemoral approach as an alternative to  conventional surgery.    Following the decision to proceed with transcatheter aortic valve replacement, a discussion has been held regarding what types of management strategies would be attempted intraoperatively in the event of life-threatening complications, including whether or not the patient would be considered a candidate for the use of cardiopulmonary bypass and/or conversion to open sternotomy for attempted surgical intervention.  The patient has been advised of a variety of complications that might develop peculiar to this approach including but not limited to risks of death, stroke, paravalvular leak, aortic dissection or other major vascular complications, aortic annulus rupture, device embolization, cardiac rupture or perforation, acute myocardial infarction, arrhythmia, heart block or bradycardia requiring permanent pacemaker placement, congestive heart failure, respiratory failure, renal failure, pneumonia, infection, other late complications related to structural valve deterioration or migration, or other complications that might ultimately cause a temporary or permanent loss of functional independence or other long term morbidity.  The patient provides full informed consent for the procedure as described and all questions were answered preoperatively.  DETAILS OF THE OPERATIVE PROCEDURE  PREPARATION:   The patient is brought to the operating room on the above mentioned date and central monitoring was established by the anesthesia team including placement of a central venous catheter and radial arterial line. The patient is placed in the supine position on the operating table.  Intravenous antibiotics are administered. The patient is monitored closely throughout the procedure under conscious sedation.  Baseline transthoracic echocardiogram is performed. The patient's chest, abdomen, both groins, and both lower extremities are prepared and draped in a sterile manner. A time out procedure is  performed.   PERIPHERAL ACCESS:   Using ultrasound guidance, femoral arterial and venous access is obtained with placement of 6 Fr sheaths on the left side.  A pigtail  diagnostic catheter was passed through the femoral arterial sheath under fluoroscopic guidance into the aortic root.  A temporary transvenous pacemaker catheter was passed through the femoral venous sheath under fluoroscopic guidance into the right ventricle.  The pacemaker was tested to ensure stable lead placement and pacemaker capture. Aortic root angiography was performed in order to determine the optimal angiographic angle for valve deployment.  TRANSFEMORAL ACCESS:  A micropuncture technique is used to access the right femoral artery under fluoroscopic and ultrasound guidance.  2 Perclose devices are deployed at 10' and 2' positions to 'PreClose' the femoral artery. An 8 French sheath is placed and then an Amplatz Superstiff wire is advanced through the sheath. This is changed out for an 18 Fr Dry-Seal sheath after progressively dilating over the Superstiff wire.  An AL-2 catheter was used to direct a straight-tip exchange length wire across the native aortic valve into the left ventricle. This was exchanged out for a pigtail catheter and position was confirmed in the LV apex. Simultaneous LV and Ao pressures were recorded.  The pigtail catheter was exchanged for a Confida wire in the LV apex.    BALLOON AORTIC VALVULOPLASTY:  Performed with rapid pacing using an 18 mm True Balloon  TRANSCATHETER HEART VALVE DEPLOYMENT:  A Medtronic Evolut Pro Plus heart valve (size 29 mm) was prepared and crimped per manufacturer's guidelines, and the proper orientation of the valve is confirmed on the Medtronic delivery system. The valve was advanced through the introducer sheath using normal technique until in an appropriate position in the abdominal aorta beyond the sheath tip. The valve was advanced around the aortic arch and carefully  positioned across the aortic valve annulus.  The valve is then deployed using normal technique in multiple fluoroscopic projections.  Aortic root angiography is performed to confirm appropriate valve positioning.  The valve is released to 80% where it is assessed, then fully released.  The patient's hemodynamic recovery following valve deployment is good.  The deployment balloon and guidewire are both removed. Echo demostrated acceptable post-procedural gradients, stable mitral valve function, and mild aortic insufficiency.    PROCEDURE COMPLETION:  The sheath was removed and femoral artery closure is performed using the 2 previously deployed Perclose devices.  Protamine is administered once femoral arterial repair was complete. The site is clear with no evidence of bleeding or hematoma after the sutures are tightened. The temporary pacemaker and pigtail catheters are removed. Mynx closure is used for contralateral femoral arterial hemostasis for the 6 Fr sheath.  The patient tolerated the procedure well and is transported to the surgical intensive care in stable condition. There were no immediate intraoperative complications. All sponge instrument and needle counts are verified correct at completion of the operation.   The patient received a total of 81 mL of intravenous contrast during the procedure.   Sherren Mocha, MD 05/29/2019 12:35 PM

## 2019-05-30 MED FILL — Potassium Chloride Inj 2 mEq/ML: INTRAVENOUS | Qty: 40 | Status: AC

## 2019-05-30 MED FILL — Heparin Sodium (Porcine) Inj 1000 Unit/ML: INTRAMUSCULAR | Qty: 30 | Status: AC

## 2019-05-30 MED FILL — Magnesium Sulfate Inj 50%: INTRAMUSCULAR | Qty: 10 | Status: AC

## 2019-05-31 ENCOUNTER — Telehealth: Payer: Self-pay | Admitting: Physician Assistant

## 2019-05-31 NOTE — Telephone Encounter (Signed)
  [place] VALVE TEAM   Patient contacted regarding discharge from [facility] on 05/29/19  Patient understands to follow up with provider Nell Range on 3/24 at [place].  Patient understands discharge instructions? yes Patient understands medications and regimen? yes Patient understands to bring all medications to this visit? yes  Angelena Form PA-C  [facility]

## 2019-06-04 NOTE — Progress Notes (Signed)
[facility] [place]                                       Cardiology Office Note    Date:  06/06/2019   ID:  Abril, Needler 12/16/1945, MRN [id number]  PCP:  Marco Collie, MD  Cardiologist: Dr. Bettina Gavia / Dr. Burt Knack & Dr. Cyndia Bent (TAVR)   CC: [facility] s/p TAVR  History of Present Illness:  Kamalpreet Seman is a 74 y.o. female with a history of HTN, CKD stage II, obesity, chronic diastolic CHF, recent presumed Covid 19 infection and severe AS s/p TAVR (05/28/19) who presents to clinic for follow up.   She was admitted with congestive heart failure in 08/2018 and had an echocardiogram performed which showed an ejection fraction of 55 to 60%. This reportedly showed a bicuspid aortic valve with severe thickening and moderate calcification and a peak velocity of 4.2 m/s with an aortic valve area of 0.6 to 0.7 cm consistent with severe aortic stenosis. She had a follow-up echocardiogram on 12/31/2018 which showed an aortic valve mean gradient of 49 mmHg with a peak gradient of 69 mmHg and an aortic valve area by VTI measuring 0.58 cm. Left ventricular ejection fraction was 55 to 60%. She was seen by Dr. Bettina Gavia in November 2020 and referred for cardiac catheterization which was performed on12/20/2020. This showed nonobstructive coronary disease and a mean gradient of 43 mmHg across the aortic valve consistent with severe aortic stenosis. Right heart pressures were within normal limits. She was scheduled for TAVR work-up and plan potential surgery in mid January but had to delay her appointment so that she could have a noninvasive knee procedure. She decided that she did not want to cancel the knee procedure due to the amount of pain that she was having.Then she became very ill with suspected COVID-19 infection along with multiple other family members. She lost over 25 pounds and was very weak and fatigued.  The patient was evaluated by the  multidisciplinary valve team and underwent successful TAVR with a 29 mm Medtronic Evolut Pro + THV via the TF approach on 05/28/19. Post operative echo showed EF 55%, normally functioning TAVR with an elevated mean gradient of 20 mm hg (felt to be 2/2 patient prosthesis mismatch) and mild-mod PVL.She was discharged on aspirin and plavix.   Today she presents to clinic for follow up. No CP or SOB. No LE edema, orthopnea or PND. No dizziness or syncope. No blood in stool or urine. No palpitations. She is doing well with a big improvement in her breath. Sometimes gets a funny feeling in her chest. Excited for her husband to be discharged soon.    Past Medical History:  Diagnosis Date  . Anxiety    extreme  . Benign hypertension with CKD (chronic kidney disease), stage II   . Breast cancer ([place])    Pre-cancerous cell in Left breast  . CKD (chronic kidney disease), stage III   . Claustrophobia   . GERD (gastroesophageal reflux disease)   . History of kidney stones   . Hyperuricemia   . Lumbar stenosis   . Osteoarthrosis   . S/P TAVR (transcatheter aortic valve replacement) 05/28/2019   s/p TAVR with a 29 mm Medtronic Evolut Pro + via the TF approach by Dr. Burt Knack and Dr. Cyndia Bent   . Severe aortic stenosis   .  Vitamin D deficiency     Past Surgical History:  Procedure Laterality Date  . ABDOMINAL HYSTERECTOMY    . ABDOMINAL HYSTERECTOMY    . APPENDECTOMY    . BACK SURGERY    . CARDIAC CATHETERIZATION  03/11/2019  . CHOLECYSTECTOMY    . GALLBLADDER SURGERY    . INTRAOPERATIVE TRANSTHORACIC ECHOCARDIOGRAM  05/28/2019   Procedure: Intraoperative Transthoracic Echocardiogram;  Surgeon: Sherren Mocha, MD;  Location: [facility];  Service: Open Heart Surgery;;  . IR RADIOLOGY PERIPHERAL GUIDED IV START  05/14/2019  . IR US GUIDE VASC ACCESS RIGHT  05/14/2019  . LUMBAR LAMINECTOMY/DECOMPRESSION MICRODISCECTOMY N/A 06/12/2017   Procedure: LAMINECTOMY AND FORAMINOTOMY LUMBAR THREE- LUMBAR FOUR, LUMBAR  FOUR- LUMBAR FIVE;  Surgeon: Newman Pies, MD;  Location: [facility];  Service: Neurosurgery;  Laterality: N/A;  . RIGHT/LEFT HEART CATH AND CORONARY ANGIOGRAPHY N/A 03/11/2019   Procedure: RIGHT/LEFT HEART CATH AND CORONARY ANGIOGRAPHY;  Surgeon: Sherren Mocha, MD;  Location: [facility];  Service: Cardiovascular;  Laterality: N/A;  . TONSILLECTOMY    . TRANSCATHETER AORTIC VALVE REPLACEMENT, TRANSFEMORAL N/A 05/28/2019   Procedure: TRANSCATHETER AORTIC VALVE REPLACEMENT, TRANSFEMORAL;  Surgeon: Sherren Mocha, MD;  Location: [place];  Service: Open Heart Surgery;  Laterality: N/A;    Current Medications: Outpatient Medications Prior to Visit  Medication Sig Dispense Refill  . amLODipine (NORVASC) 5 MG tablet Take 1 tablet (5 mg total) by mouth daily. 90 tablet 0  . clopidogrel (PLAVIX) 75 MG tablet Take 1 tablet (75 mg total) by mouth daily with breakfast. 90 tablet 1  . tamoxifen (NOLVADEX) 20 MG tablet TAKE 1 TABLET(20 MG) BY MOUTH DAILY 90 tablet 3  . carvedilol (COREG) 6.25 MG tablet Take 6.25 mg by mouth 2 (two) times daily with a meal.     . fexofenadine (ALLEGRA) 180 MG tablet Take 180 mg by mouth daily as needed (allergies ([place] only)).     . furosemide (LASIX) 20 MG tablet Take 1 tablet (20 mg total) by mouth daily. 90 tablet 0  . lisinopril (ZESTRIL) 10 MG tablet Take 10 mg by mouth daily.     . pantoprazole (PROTONIX) 40 MG tablet Take 1 tablet (40 mg total) by mouth daily. 90 tablet 1   No facility-administered medications prior to visit.     Allergies:   Penicillins, Codeine, Lipitor [atorvastatin], and Prednisone   Social History   Socioeconomic History  . Marital status: Married    Spouse name: Not on file  . Number of children: 2  . Years of education: 76  . Highest education level: Not on file  Occupational History  . Occupation: child care  Tobacco Use  . Smoking status: Never Smoker  . Smokeless tobacco: Never Used  Substance and Sexual  Activity  . Alcohol use: No  . Drug use: No  . Sexual activity: Not on file  Other Topics Concern  . Not on file  Social History Narrative   Lives with husband in a one story home.  Has 2 children.  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  Education: high school.     Social Determinants of Health   Financial Resource Strain:   . Difficulty of Paying Living Expenses:   Food Insecurity:   . Worried About Charity fundraiser in the Last Year:   . Arboriculturist in the Last Year:   Transportation Needs:   . Film/video editor (Medical):   Marland Kitchen Lack of Transportation (Non-Medical):   Physical  Activity:   . Days of Exercise per Week:   . Minutes of Exercise per Session:   Stress:   . Feeling of Stress :   Social Connections:   . Frequency of Communication with Friends and Family:   . Frequency of Social Gatherings with Friends and Family:   . Attends Religious Services:   . Active Member of Clubs or Organizations:   . Attends Archivist Meetings:   Marland Kitchen Marital Status:      Family History:  The patient's family history includes Alzheimer's disease in her mother; Aneurysm in her father; Stomach cancer in her brother.     ROS:   Please see the history of present illness.    ROS All other systems reviewed and are negative.   PHYSICAL EXAM:   VS:  BP (!) 146/68   Pulse 60   Ht 5\' 5"  (1.651 m)   Wt 242 lb (109.8 kg)   SpO2 96%   BMI 40.27 kg/m    GEN: Well nourished, well developed, in no acute distress HEENT: normal Neck: no JVD or masses Cardiac: RRR; no murmurs, rubs, or gallops,no edema  Respiratory:  clear to auscultation bilaterally, normal work of breathing GI: soft, nontender, nondistended, + BS MS: no deformity or atrophy Skin: warm and dry, no rash Neuro:  Alert and Oriented x 3, Strength and sensation are intact Psych: euthymic mood, full affect   Wt Readings from Last 3 Encounters:  06/05/19 242 lb (109.8 kg)  05/29/19 243 lb 6.2 oz (110.4 kg)   05/24/19 246 lb (111.6 kg)      Studies/Labs Reviewed:   EKG:  EKG is ordered today.  The ekg ordered today demonstrates sinus  Recent Labs: 04/29/2019: NT-Pro BNP 669 05/24/2019: ALT 13; B Natriuretic Peptide 121.3 05/29/2019: BUN 15; Creatinine, Ser 0.82; Hemoglobin 10.2; Magnesium 1.9; Platelets 122; Potassium 3.5; Sodium 140   Lipid Panel No results found for: CHOL, TRIG, HDL, CHOLHDL, VLDL, LDLCALC, LDLDIRECT  Additional studies/ records that were reviewed today include:   TAVR OPERATIVE NOTE  Date of Procedure:05/28/2019  Preoperative Diagnosis:Severe Aortic Stenosis   Postoperative Diagnosis:Same   Procedure:   Transcatheter Aortic Valve Replacement - PercutaneousRightTransfemoral Approach Medtronic Evolut Pro +(size3mm, serial # [id number])  Co-Surgeons:Bryan Alveria Apley, MD and Sherren Mocha, MD   Anesthesiologist:S. Gifford Shave, MD  Anne Hahn, MD  Pre-operative Echo Findings: ? Severe aortic stenosis ? Normal left ventricular systolic function  Post-operative Echo Findings: ? Mildparavalvular leak ? Normal left ventricular systolic function  _____________   Echo 05/29/19: IMPRESSIONS  1. Left ventricular ejection fraction, by estimation, is 60 to 65%. The  left ventricle has normal function. The left ventricle has no regional  wall motion abnormalities. There is moderate concentric left ventricular  hypertrophy. Left ventricular  diastolic parameters are consistent with Grade II diastolic dysfunction  (pseudonormalization).  2. Right ventricular systolic function is normal. The right ventricular  size is normal.  3. Left atrial size was moderately dilated.  4. Right atrial size was mildly dilated.  5. The mitral valve is normal in structure. Mild mitral valve  regurgitation. No evidence of mitral stenosis.   6. Mild to moderate paravalvular . The aortic valve has been  repaired/replaced. Aortic valve regurgitation is not visualized. No aortic  stenosis is present. There is a 29 mm Medtronic, stented (TAVR) valve  present in the aortic position. Procedure Date:  05/28/2019. Aortic valve mean gradient measures 20.0 mmHg.  7. The inferior vena cava is normal in size  with greater than 50%  respiratory variability, suggesting right atrial pressure of 3 mmHg.   ASSESSMENT & PLAN:   Severe AS s/p TAVR: groin sites healing well. ECG with no HAVB. SBE prophylaxis discussed; I have RX'd clindamycin due to a PCN allergy. Continue on aspirin and plavix. I will see her back next month for follow up and echo .  HTN: BP borderline today. Improved to 138/72 on my personal recheck. No  changes made  Chronic diastolic CHF: appears euvolemic. Continue lasix 20mg  daily    Medication Adjustments/Labs and Tests Ordered: Current medicines are reviewed at length with the patient today.  Concerns regarding medicines are outlined above.  Medication changes, Labs and Tests ordered today are listed in the Patient Instructions below. Patient Instructions  Medication Instructions:  1) Your provider discussed the importance of taking an antibiotic prior to all dental visits to prevent damage to the heart valves from infection. You were given a prescription for CLINDAMYCIN 600mg  to take one hour prior to any dental appointment.    Follow-Up: Please keep your appointments as scheduled!    Signed, Angelena Form, PA-C  06/06/2019 10:06 AM    [facility] [place], [place], [place]  [address] Phone: [phone]; Fax: [phone]

## 2019-06-05 ENCOUNTER — Ambulatory Visit (INDEPENDENT_AMBULATORY_CARE_PROVIDER_SITE_OTHER): Payer: Medicare Other | Admitting: Physician Assistant

## 2019-06-05 ENCOUNTER — Other Ambulatory Visit: Payer: Self-pay

## 2019-06-05 ENCOUNTER — Encounter: Payer: Self-pay | Admitting: Physician Assistant

## 2019-06-05 VITALS — BP 146/68 | HR 60 | Ht 65.0 in | Wt 242.0 lb

## 2019-06-05 DIAGNOSIS — I5032 Chronic diastolic (congestive) heart failure: Secondary | ICD-10-CM | POA: Diagnosis not present

## 2019-06-05 DIAGNOSIS — Z952 Presence of prosthetic heart valve: Secondary | ICD-10-CM | POA: Diagnosis not present

## 2019-06-05 DIAGNOSIS — I10 Essential (primary) hypertension: Secondary | ICD-10-CM

## 2019-06-05 MED ORDER — LISINOPRIL 10 MG PO TABS: 10.0000 mg | ORAL_TABLET | Freq: Every day | ORAL | 3 refills | Status: DC

## 2019-06-05 MED ORDER — CARVEDILOL 6.25 MG PO TABS: 6.2500 mg | ORAL_TABLET | Freq: Two times a day (BID) | ORAL | 3 refills | Status: DC

## 2019-06-05 MED ORDER — FUROSEMIDE 20 MG PO TABS: 20.0000 mg | ORAL_TABLET | Freq: Every day | ORAL | 3 refills | Status: DC

## 2019-06-05 MED ORDER — CLINDAMYCIN HCL 300 MG PO CAPS: ORAL_CAPSULE | ORAL | 11 refills | Status: DC

## 2019-06-05 NOTE — Patient Instructions (Signed)
Medication Instructions:  1) Your provider discussed the importance of taking an antibiotic prior to all dental visits to prevent damage to the heart valves from infection. You were given a prescription for CLINDAMYCIN 600mg  to take one hour prior to any dental appointment.    Follow-Up: Please keep your appointments as scheduled!

## 2019-06-12 DIAGNOSIS — G8929 Other chronic pain: Secondary | ICD-10-CM | POA: Diagnosis not present

## 2019-06-12 DIAGNOSIS — M25562 Pain in left knee: Secondary | ICD-10-CM | POA: Diagnosis not present

## 2019-06-12 DIAGNOSIS — I350 Nonrheumatic aortic (valve) stenosis: Secondary | ICD-10-CM | POA: Diagnosis not present

## 2019-06-12 DIAGNOSIS — M25561 Pain in right knee: Secondary | ICD-10-CM | POA: Diagnosis not present

## 2019-06-12 DIAGNOSIS — I5030 Unspecified diastolic (congestive) heart failure: Secondary | ICD-10-CM | POA: Diagnosis not present

## 2019-07-03 ENCOUNTER — Ambulatory Visit (INDEPENDENT_AMBULATORY_CARE_PROVIDER_SITE_OTHER): Payer: Medicare Other

## 2019-07-03 ENCOUNTER — Other Ambulatory Visit: Payer: Self-pay

## 2019-07-03 DIAGNOSIS — Z952 Presence of prosthetic heart valve: Secondary | ICD-10-CM

## 2019-07-03 NOTE — Progress Notes (Signed)
Complete echocardiogram has been performed.  Jimmy Eddis Pingleton RDCS, RVT 

## 2019-07-03 NOTE — Progress Notes (Signed)
[facility]   MULTIDISCIPLINARY HEART VALVE TEAM  Virtual Visit via Telephone Note   This visit type was conducted due to national recommendations for restrictions regarding the COVID-19 Pandemic (e.g. social distancing) in an effort to limit this patient's exposure and mitigate transmission in our community.  Due to her co-morbid illnesses, this patient is at least at moderate risk for complications without adequate follow up.  This format is felt to be most appropriate for this patient at this time.  The patient did not have access to video technology/had technical difficulties with video requiring transitioning to audio format only (telephone).  All issues noted in this document were discussed and addressed.  No physical exam could be performed with this format.  Please refer to the patient's chart for her  consent to telehealth for [facility].   The patient was identified using 2 identifiers.  Date:  07/04/2019   ID:  [name], [place] 10/16/1947MRN [id number]  Patient Location: Home Provider Location: Office  PCP:  Marco Collie, MD  Cardiologist:   Electrophysiologist:  None   Evaluation Performed:  Follow-Up Visit  Chief Complaint:  1 month s/p TAVR  History of Present Illness:    Senaida Klingler is a 74 y.o. female with HTN, CKD stage II, obesity, chronic diastolic CHF, recent presumed Covid 38 infection and severe ASs/p TAVR (05/28/19) who presents to clinic for follow up.   She was admitted with congestive heart failure in 08/2018 and had an echocardiogram performed which showed an ejection fraction of 55 to 60%. This reportedly showed a bicuspid aortic valve with severe thickening and moderate calcification and a peak velocity of 4.2 m/s with an aortic valve area of 0.6 to 0.7 cm consistent with severe aortic stenosis. She had a follow-up echocardiogram on 12/31/2018 which showed an aortic valve mean gradient of 49 mmHg with a peak gradient of 69  mmHg and an aortic valve area by VTI measuring 0.58 cm. Left ventricular ejection fraction was 55 to 60%. She was seen by Dr. Bettina Gavia in November 2020 and referred for cardiac catheterization which was performed on12/20/2020. This showed nonobstructive coronary disease and a mean gradient of 43 mmHg across the aortic valve consistent with severe aortic stenosis. Right heart pressures were within normal limits. She was scheduled for TAVR work-up and plan potential surgery in mid January but had to delay her appointment so that she could have a noninvasive knee procedure. She decided that she did not want to cancel the knee procedure due to the amount of pain that she was having.Then she became very ill with suspected COVID-19 infection along with multiple other family members.She lost over 25 pounds and was very weak and fatigued.  The patient was evaluated by the multidisciplinary valve team and underwent successful TAVR with a76mm Medtronic Evolut Pro +THV via the TF approach on 05/28/19. Post operative echo showed EF 55%, normally functioning TAVR with an elevated mean gradient of 20 mm hg (felt to be 2/2 patient prosthesis mismatch) and mild-mod PVL.She was discharged on aspirin and plavix.   Today she presents for follow up via virtual medicine. The patient does not have symptoms concerning for COVID-19 infection (fever, chills, cough, or new shortness of breath). No CP or SOB. No LE edema, orthopnea or PND. No dizziness or syncope. No blood in stool or urine. No palpitations. Having some pain in her knees that she thinks is related to arthritis. She will see Dr. Nyra Capes about this. Husband is back  home now. Hasn't been taking an aspirin bc it wasn't on her med list.    Past Medical History:  Diagnosis Date  . Anxiety    extreme  . Benign hypertension with CKD (chronic kidney disease), stage II   . Breast cancer ([place])    Pre-cancerous cell in Left breast  . CKD (chronic kidney disease),  stage III   . Claustrophobia   . GERD (gastroesophageal reflux disease)   . History of kidney stones   . Hyperuricemia   . Lumbar stenosis   . Osteoarthrosis   . S/P TAVR (transcatheter aortic valve replacement) 05/28/2019   s/p TAVR with a 29 mm Medtronic Evolut Pro + via the TF approach by Dr. Burt Knack and Dr. Cyndia Bent   . Severe aortic stenosis   . Vitamin D deficiency    Past Surgical History:  Procedure Laterality Date  . ABDOMINAL HYSTERECTOMY    . ABDOMINAL HYSTERECTOMY    . APPENDECTOMY    . BACK SURGERY    . CARDIAC CATHETERIZATION  03/11/2019  . CHOLECYSTECTOMY    . GALLBLADDER SURGERY    . INTRAOPERATIVE TRANSTHORACIC ECHOCARDIOGRAM  05/28/2019   Procedure: Intraoperative Transthoracic Echocardiogram;  Surgeon: Sherren Mocha, MD;  Location: [facility];  Service: Open Heart Surgery;;  . IR RADIOLOGY PERIPHERAL GUIDED IV START  05/14/2019  . IR US GUIDE VASC ACCESS RIGHT  05/14/2019  . LUMBAR LAMINECTOMY/DECOMPRESSION MICRODISCECTOMY N/A 06/12/2017   Procedure: LAMINECTOMY AND FORAMINOTOMY LUMBAR THREE- LUMBAR FOUR, LUMBAR FOUR- LUMBAR FIVE;  Surgeon: Newman Pies, MD;  Location: [facility];  Service: Neurosurgery;  Laterality: N/A;  . RIGHT/LEFT HEART CATH AND CORONARY ANGIOGRAPHY N/A 03/11/2019   Procedure: RIGHT/LEFT HEART CATH AND CORONARY ANGIOGRAPHY;  Surgeon: Sherren Mocha, MD;  Location: [facility];  Service: Cardiovascular;  Laterality: N/A;  . TONSILLECTOMY    . TRANSCATHETER AORTIC VALVE REPLACEMENT, TRANSFEMORAL N/A 05/28/2019   Procedure: TRANSCATHETER AORTIC VALVE REPLACEMENT, TRANSFEMORAL;  Surgeon: Sherren Mocha, MD;  Location: [facility];  Service: Open Heart Surgery;  Laterality: N/A;     Current Meds  Medication Sig  . amLODipine (NORVASC) 5 MG tablet Take 1 tablet (5 mg total) by mouth daily.  . carvedilol (COREG) 6.25 MG tablet Take 1 tablet (6.25 mg total) by mouth 2 (two) times daily with a meal.  . clindamycin (CLEOCIN) 300 MG capsule Take 600mg  (2  capsules) one hour prior to all dental visits.  . clopidogrel (PLAVIX) 75 MG tablet Take 1 tablet (75 mg total) by mouth daily with breakfast.  . furosemide (LASIX) 20 MG tablet Take 1 tablet (20 mg total) by mouth daily.  Marland Kitchen lisinopril (ZESTRIL) 10 MG tablet Take 1 tablet (10 mg total) by mouth daily.  . tamoxifen (NOLVADEX) 20 MG tablet TAKE 1 TABLET(20 MG) BY MOUTH DAILY     Allergies:   Penicillins, Codeine, Lipitor [atorvastatin], and Prednisone   Social History   Tobacco Use  . Smoking status: Never Smoker  . Smokeless tobacco: Never Used  Substance Use Topics  . Alcohol use: No  . Drug use: No     Family Hx: The patient's family history includes Alzheimer's disease in her mother; Aneurysm in her father; Stomach cancer in her brother.  ROS:   Please see the history of present illness.    All other systems reviewed and are negative.   Prior CV studies:   The following studies were reviewed today:  TAVR OPERATIVE NOTE  Date of Procedure:05/28/2019  Preoperative Diagnosis:Severe Aortic Stenosis   Postoperative Diagnosis:Same  Procedure:   Transcatheter Aortic Valve Replacement - PercutaneousRightTransfemoral Approach Medtronic Evolut Pro +(size22mm, serial # [id number])  Co-Surgeons:Bryan Alveria Apley, MD and Sherren Mocha, MD   Anesthesiologist:S. Gifford Shave, MD  Anne Hahn, MD  Pre-operative Echo Findings: ? Severe aortic stenosis ? Normal left ventricular systolic function  Post-operative Echo Findings: ? Mildparavalvular leak ? Normal left ventricular systolic function  _____________  Echo3/17/21: IMPRESSIONS  1. Left ventricular ejection fraction, by estimation, is 60 to 65%. The  left ventricle has normal function. The left ventricle has no regional  wall motion abnormalities. There is moderate concentric left  ventricular  hypertrophy. Left ventricular  diastolic parameters are consistent with Grade II diastolic dysfunction  (pseudonormalization).  2. Right ventricular systolic function is normal. The right ventricular  size is normal.  3. Left atrial size was moderately dilated.  4. Right atrial size was mildly dilated.  5. The mitral valve is normal in structure. Mild mitral valve  regurgitation. No evidence of mitral stenosis.  6. Mild to moderate paravalvular . The aortic valve has been  repaired/replaced. Aortic valve regurgitation is not visualized. No aortic  stenosis is present. There is a 29 mm Medtronic, stented (TAVR) valve  present in the aortic position. Procedure Date:  05/28/2019. Aortic valve mean gradient measures 20.0 mmHg.  7. The inferior vena cava is normal in size with greater than 50%  respiratory variability, suggesting right atrial pressure of 3 mmHg.  _______________  Echo 07/03/19 IMPRESSIONS 1. Left ventricular ejection fraction, by estimation, is 55 to 60%. The  left ventricle has normal function. The left ventricle has no regional  wall motion abnormalities. There is moderate concentric left ventricular  hypertrophy. Left ventricular  diastolic parameters are consistent with Grade II diastolic dysfunction  (pseudonormalization).  2. Right ventricular systolic function is normal. The right ventricular  size is normal. There is normal pulmonary artery systolic pressure.  3. Left atrial size was moderately dilated.  4. The mitral valve is abnormal, with moderate posterior mitral annular  calcification. Mild mitral valve regurgitation. No evidence of mitral  stenosis.  5. Well seated bioprosthetic 29 mm medtronis stented valve, s/p TAVR.  Aortic valve regurgitation is mild. Possible stenosis of the biprosthesis.  Aortic valve mean gradient measures 24.0 mmHg. Aortic valve Vmax measures  3.11 m/s.  6. There is mild dilatation of the ascending aorta  measuring 39 mm.  7. The inferior vena cava is normal in size with greater than 50%  respiratory variability, suggesting right atrial pressure of 3 mmHg.   Labs/Other Tests and Data Reviewed:    EKG:  No ECG reviewed.  Recent Labs: 04/29/2019: NT-Pro BNP 669 05/24/2019: ALT 13; B Natriuretic Peptide 121.3 05/29/2019: BUN 15; Creatinine, Ser 0.82; Hemoglobin 10.2; Magnesium 1.9; Platelets 122; Potassium 3.5; Sodium 140   Recent Lipid Panel No results found for: CHOL, TRIG, HDL, CHOLHDL, LDLCALC, LDLDIRECT  Wt Readings from Last 3 Encounters:  07/04/19 232 lb (105.2 kg)  06/05/19 242 lb (109.8 kg)  05/29/19 243 lb 6.2 oz (110.4 kg)     Objective:    Vital Signs:  BP 132/78   Pulse 80   Ht 5\' 5"  (1.651 m)   Wt 232 lb (105.2 kg)   BMI 38.61 kg/m    VITAL SIGNS:  reviewed  ASSESSMENT & PLAN:    Severe AS s/p TAVR: echo 07/03/19 shows EF 55%, normally functioning TAVR with a mean gradient of 24 mmHg and mild PVL. Gradients have been elevated since POD1 study (20 mm Hg)  felt to be 2/2 PPM given large body habitus. She is doing excellent with NYHA class I symptoms. She has had a marked symptom improvement since TAVR. Continue on aspirin and plavix (hadn't been taking aspirin bc it wasn't on med list) and stop plavix after 6 months  (11/2019). She will continue on aspirin indefinitely. She has clindamycin for SBE prophylaxis. I will see her back in 1 year with an echo.   HTN: BP well controlled. No changes.   Chronic diastolic CHF: no s/s CHF. Continue lasix 20mg  daily   Pulmonary nodules: multiple small pulmonary nodules measuring 5 mm or less in size, nonspecific, but statistically likely benign. No follow-up needed if patient is low-risk (and has no known or suspected primary neoplasm). Non-contrast chest CT can be considered in 12 months if patient is high-risk. Patient is a never smoker and low risk. No follow up necessary.   COVID-19 Education: The signs and symptoms of  COVID-19 were discussed with the patient and how to seek care for testing (follow up with PCP or arrange E-visit). The importance of social distancing was discussed today.  Time:   Today, I have spent 11 minutes with the patient with telehealth technology discussing the above problems.     Medication Adjustments/Labs and Tests Ordered: Current medicines are reviewed at length with the patient today.  Concerns regarding medicines are outlined above.   Tests Ordered: Orders Placed This Encounter  Procedures  . ECHOCARDIOGRAM COMPLETE    Medication Changes: Meds ordered this encounter  Medications  . aspirin EC 81 MG tablet    Sig: Take 1 tablet (81 mg total) by mouth daily.    Dispense:  90 tablet    Refill:  3    Order Specific Question:   Supervising Provider    Answer:   Sherren Mocha [id number]    Follow Up:  Virtual Visit  in 1 year(s)  Signed, Angelena Form, PA-C  07/04/2019 1:46 PM    [facility]

## 2019-07-04 ENCOUNTER — Telehealth (INDEPENDENT_AMBULATORY_CARE_PROVIDER_SITE_OTHER): Payer: Medicare Other | Admitting: Physician Assistant

## 2019-07-04 ENCOUNTER — Other Ambulatory Visit: Payer: Self-pay | Admitting: Physician Assistant

## 2019-07-04 VITALS — BP 132/78 | HR 80 | Ht 65.0 in | Wt 232.0 lb

## 2019-07-04 DIAGNOSIS — Z952 Presence of prosthetic heart valve: Secondary | ICD-10-CM

## 2019-07-04 DIAGNOSIS — I110 Hypertensive heart disease with heart failure: Secondary | ICD-10-CM

## 2019-07-04 DIAGNOSIS — I10 Essential (primary) hypertension: Secondary | ICD-10-CM

## 2019-07-04 DIAGNOSIS — R918 Other nonspecific abnormal finding of lung field: Secondary | ICD-10-CM

## 2019-07-04 DIAGNOSIS — I5032 Chronic diastolic (congestive) heart failure: Secondary | ICD-10-CM | POA: Diagnosis not present

## 2019-07-04 MED ORDER — ASPIRIN EC 81 MG PO TBEC: 81.0000 mg | DELAYED_RELEASE_TABLET | Freq: Every day | ORAL | 3 refills | Status: DC

## 2019-07-04 NOTE — Patient Instructions (Signed)
Medication Instructions:  1) START ASPIRIN 81 mg daily 2) You may STOP PLAVIX around 11/28/2019 (6 months from your procedure) *If you need a refill on your cardiac medications before your next appointment, please call your pharmacy*  Follow-Up: Please keep your follow-up appointments.  We will call you to arrange your one year TAVR echo and office visit!

## 2019-07-09 DIAGNOSIS — Z0000 Encounter for general adult medical examination without abnormal findings: Secondary | ICD-10-CM | POA: Diagnosis not present

## 2019-07-09 DIAGNOSIS — Z136 Encounter for screening for cardiovascular disorders: Secondary | ICD-10-CM | POA: Diagnosis not present

## 2019-07-09 DIAGNOSIS — Z139 Encounter for screening, unspecified: Secondary | ICD-10-CM | POA: Diagnosis not present

## 2019-07-09 DIAGNOSIS — M79605 Pain in left leg: Secondary | ICD-10-CM | POA: Diagnosis not present

## 2019-07-09 DIAGNOSIS — M791 Myalgia, unspecified site: Secondary | ICD-10-CM | POA: Diagnosis not present

## 2019-07-09 DIAGNOSIS — Z7189 Other specified counseling: Secondary | ICD-10-CM | POA: Diagnosis not present

## 2019-07-09 DIAGNOSIS — M79604 Pain in right leg: Secondary | ICD-10-CM | POA: Diagnosis not present

## 2019-07-10 DIAGNOSIS — M791 Myalgia, unspecified site: Secondary | ICD-10-CM | POA: Diagnosis not present

## 2019-07-12 DIAGNOSIS — I350 Nonrheumatic aortic (valve) stenosis: Secondary | ICD-10-CM | POA: Diagnosis not present

## 2019-07-12 DIAGNOSIS — I5030 Unspecified diastolic (congestive) heart failure: Secondary | ICD-10-CM | POA: Diagnosis not present

## 2019-07-26 ENCOUNTER — Other Ambulatory Visit: Payer: Self-pay

## 2019-07-27 NOTE — Progress Notes (Signed)
Cardiology Office Note:    Date:  07/29/2019   ID:  Elizabeth [name], Elizabeth [name] 02-21-1946, MRN [id number]  PCP:  Marco Collie, MD  Cardiologist:  Shirlee More, MD    Referring MD: Marco Collie, MD    ASSESSMENT:    1. S/P TAVR (transcatheter aortic valve replacement)   2. Chronic diastolic heart failure ([place])   3. Hypertension, renal disease, stage 1-4 or unspecified chronic kidney disease    PLAN:    In order of problems listed above:  1. Improved she will need a follow-up echocardiogram in 1 year continue antiplatelet therapy 2. Stable compensated continue to decrease or discontinue diuretic next visit unexplained weight  and recheck renal function and proBNP level with her CKD 3. BP at target continue current treatment including ACE inhibitor recheck renal function potassium   Next appointment: 3 months   Medication Adjustments/Labs and Tests Ordered: Current medicines are reviewed at length with the patient today.  Concerns regarding medicines are outlined above.  No orders of the defined types were placed in this encounter.  No orders of the defined types were placed in this encounter.   Chief Complaint  Patient presents with  . Follow-up    3 MO FU after TAVR    History of Present Illness:    Elizabeth [name] is a 74 y.o. female with a hx of severe symptomatic aortic stenosis and heart failure  last seen 04/29/2019 prior to TAVR for severe symptomatic aortic stenosis.  Compliance with diet, lifestyle and medications: Yes  She underwent TAVR 05/28/2019 with Medtronic evolut pro size 29 mm AVR with an uncomplicated hospital course without bradycardia or need for pacemaker.  She had an echocardiogram performed 07/03/2019 that I independently reviewed showing normal left ventricular systolic function and grade 2 diastolic dysfunction with elevated filling pressures.  TAVR function showed the presence of mild aortic regurgitation and a mean gradient of 24 mmHg felt  to represent an element of patient prosthesis mismatch and mild to moderate paravalvular leak.  I independently reviewed that echocardiogram done with the structural heart team I feel the degree of aortic regurgitation is quite mild there is a minimal elevation of aortic valve peak velocity and the contour curve is normal.  From a cardiac perspective she has done well she is not having edema shortness of breath chest pain palpitation or syncope but her husband has been quite ill have coronary bypass surgery at the same time is ongoing urologic problems and neither of them sleep at nighttime.  Offered cardiac rehab she declines at this time. Past Medical History:  Diagnosis Date  . Anxiety    extreme  . Benign hypertension with CKD (chronic kidney disease), stage II   . Breast cancer ([place])    Pre-cancerous cell in Left breast  . Chronic diastolic heart failure ([place]) 12/20/2018  . Chronic pain of both knees 09/12/2017   Elizabeth [name] is a 74 y.o. female with low back pain, bilateral knee pain, due to multilevel multifactorial degenerative changes in the lumbar spine, and, significant multiple joint osteoarthritis.   Last Assessment & Plan:  Today I will treat with bilateral knee injections.   Elizabeth [name] will return to the clinic in 6 months.  I will asses the efficacy of this treatment and make adjustments to her   . CKD (chronic kidney disease), stage III   . Claustrophobia   . Ductal carcinoma in situ (DCIS) of left breast 11/08/2017  . GERD (gastroesophageal reflux disease)   .  History of kidney stones   . Hypertension 09/25/2018  . Hyperuricemia   . Insomnia 09/25/2018  . Lumbar radiculopathy 11/28/2017   Elizabeth [name] is a 74 y.o. female with low back pain, lower extremity pain, on the left, in the setting of previous laminotomy in April 2019.   Last Assessment & Plan:  Today I will treat with current OTC medications..   Elizabeth [name] will return to the clinic on an as needed basis.  I will asses the  efficacy of this treatment and make adjustments to her treatment plan as necessary.  Last Assessment &  . Lumbar stenosis   . Osteoarthrosis   . S/P TAVR (transcatheter aortic valve replacement) 05/28/2019   s/p TAVR with a 29 mm Medtronic Evolut Pro + via the TF approach by Dr. Burt Knack and Dr. Cyndia Bent   . Severe aortic stenosis   . Tachycardia 09/25/2018  . Vitamin D deficiency     Past Surgical History:  Procedure Laterality Date  . ABDOMINAL HYSTERECTOMY    . ABDOMINAL HYSTERECTOMY    . APPENDECTOMY    . BACK SURGERY    . CARDIAC CATHETERIZATION  03/11/2019  . CHOLECYSTECTOMY    . GALLBLADDER SURGERY    . INTRAOPERATIVE TRANSTHORACIC ECHOCARDIOGRAM  05/28/2019   Procedure: Intraoperative Transthoracic Echocardiogram;  Surgeon: Sherren Mocha, MD;  Location: [place];  Service: Open Heart Surgery;;  . IR RADIOLOGY PERIPHERAL GUIDED IV START  05/14/2019  . IR US GUIDE VASC ACCESS RIGHT  05/14/2019  . LUMBAR LAMINECTOMY/DECOMPRESSION MICRODISCECTOMY N/A 06/12/2017   Procedure: LAMINECTOMY AND FORAMINOTOMY LUMBAR THREE- LUMBAR FOUR, LUMBAR FOUR- LUMBAR FIVE;  Surgeon: Newman Pies, MD;  Location: [place];  Service: Neurosurgery;  Laterality: N/A;  . RIGHT/LEFT HEART CATH AND CORONARY ANGIOGRAPHY N/A 03/11/2019   Procedure: RIGHT/LEFT HEART CATH AND CORONARY ANGIOGRAPHY;  Surgeon: Sherren Mocha, MD;  Location: [place] [facility];  Service: Cardiovascular;  Laterality: N/A;  . TONSILLECTOMY    . TRANSCATHETER AORTIC VALVE REPLACEMENT, TRANSFEMORAL N/A 05/28/2019   Procedure: TRANSCATHETER AORTIC VALVE REPLACEMENT, TRANSFEMORAL;  Surgeon: Sherren Mocha, MD;  Location: [place];  Service: Open Heart Surgery;  Laterality: N/A;    Current Medications: Current Meds  Medication Sig  . amLODipine (NORVASC) 5 MG tablet Take 1 tablet (5 mg total) by mouth daily.  . carvedilol (COREG) 6.25 MG tablet Take 1 tablet (6.25 mg total) by mouth 2 (two) times daily with a meal.  . clopidogrel (PLAVIX) 75 MG  tablet Take 1 tablet (75 mg total) by mouth daily with breakfast.  . furosemide (LASIX) 20 MG tablet Take 1 tablet (20 mg total) by mouth daily.  Marland Kitchen lisinopril (ZESTRIL) 10 MG tablet Take 1 tablet (10 mg total) by mouth daily.  . pantoprazole (PROTONIX) 40 MG tablet Take 40 mg by mouth daily.  . tamoxifen (NOLVADEX) 20 MG tablet TAKE 1 TABLET(20 MG) BY MOUTH DAILY     Allergies:   Penicillins, Atorvastatin, Codeine, and Prednisone   Social History   Socioeconomic History  . Marital status: Married    Spouse name: Not on file  . Number of children: 2  . Years of education: 69  . Highest education level: Not on file  Occupational History  . Occupation: child care  Tobacco Use  . Smoking status: Never Smoker  . Smokeless tobacco: Never Used  Substance and Sexual Activity  . Alcohol use: No  . Drug use: No  . Sexual activity: Not on file  Other Topics Concern  . Not on  file  Social History Narrative   Lives with husband in a one story home.  Has 2 children.  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  Education: high school.     Social Determinants of Health   Financial Resource Strain:   . Difficulty of Paying Living Expenses:   Food Insecurity:   . Worried About Charity fundraiser in the Last Year:   . Arboriculturist in the Last Year:   Transportation Needs:   . Film/video editor (Medical):   Marland Kitchen Lack of Transportation (Non-Medical):   Physical Activity:   . Days of Exercise per Week:   . Minutes of Exercise per Session:   Stress:   . Feeling of Stress :   Social Connections:   . Frequency of Communication with Friends and Family:   . Frequency of Social Gatherings with Friends and Family:   . Attends Religious Services:   . Active Member of Clubs or Organizations:   . Attends Archivist Meetings:   Marland Kitchen Marital Status:      Family History: The patient's family history includes Alzheimer's disease in her mother; Aneurysm in her father; Stomach cancer in her  brother. ROS:   Please see the history of present illness.    All other systems reviewed and are negative.  EKGs/Labs/Other Studies Reviewed:    The following studies were reviewed today:    Recent Labs: 04/29/2019: NT-Pro BNP 669 05/24/2019: ALT 13; B Natriuretic Peptide 121.3 05/29/2019: BUN 15; Creatinine, Ser 0.82; Hemoglobin 10.2; Magnesium 1.9; Platelets 122; Potassium 3.5; Sodium 140  Recent Lipid Panel No results found for: CHOL, TRIG, HDL, CHOLHDL, VLDL, LDLCALC, LDLDIRECT  Physical Exam:    VS:  BP 128/70   Pulse 61   Temp (!) 97 F (36.1 C)   Ht 5\' 5"  (1.651 m)   Wt 242 lb (109.8 kg)   SpO2 97%   BMI 40.27 kg/m     Wt Readings from Last 3 Encounters:  07/29/19 242 lb (109.8 kg)  07/04/19 232 lb (105.2 kg)  06/05/19 242 lb (109.8 kg)     GEN:  Well nourished, well developed in no acute distress HEENT: Normal NECK: No JVD; No carotid bruits LYMPHATICS: No lymphadenopathy CARDIAC: Regular 1/6 midsystolic blowing ejection murmur over the aortic area RRR, no, rubs, gallops RESPIRATORY:  Clear to auscultation without rales, wheezing or rhonchi  ABDOMEN: Soft, non-tender, non-distended MUSCULOSKELETAL:  No edema; No deformity  SKIN: Warm and dry NEUROLOGIC:  Alert and oriented x 3 PSYCHIATRIC:  Normal affect    Signed, Shirlee More, MD  07/29/2019 1:31 PM    [facility]

## 2019-07-29 ENCOUNTER — Other Ambulatory Visit: Payer: Self-pay

## 2019-07-29 ENCOUNTER — Ambulatory Visit: Payer: Medicare Other | Admitting: Cardiology

## 2019-07-29 ENCOUNTER — Encounter: Payer: Self-pay | Admitting: Cardiology

## 2019-07-29 VITALS — BP 128/70 | HR 61 | Temp 97.0°F | Ht 65.0 in | Wt 242.0 lb

## 2019-07-29 DIAGNOSIS — I129 Hypertensive chronic kidney disease with stage 1 through stage 4 chronic kidney disease, or unspecified chronic kidney disease: Secondary | ICD-10-CM

## 2019-07-29 DIAGNOSIS — Z952 Presence of prosthetic heart valve: Secondary | ICD-10-CM | POA: Diagnosis not present

## 2019-07-29 DIAGNOSIS — I5032 Chronic diastolic (congestive) heart failure: Secondary | ICD-10-CM | POA: Diagnosis not present

## 2019-07-29 NOTE — Addendum Note (Signed)
Addended by: Resa Miner I on: 07/29/2019 01:35 PM   Modules accepted: Orders

## 2019-07-29 NOTE — Patient Instructions (Signed)
Medication Instructions:  Your physician recommends that you continue on your current medications as directed. Please refer to the Current Medication list given to you today.  *If you need a refill on your cardiac medications before your next appointment, please call your pharmacy*   Lab Work: Your physician recommends that you return for lab work in: TODAY BMP, ProBNP If you have labs (blood work) drawn today and your tests are completely normal, you will receive your results only by: Marland Kitchen MyChart Message (if you have MyChart) OR . A paper copy in the mail If you have any lab test that is abnormal or we need to change your treatment, we will call you to review the results.   Testing/Procedures: None   Follow-Up: At [facility], you and your health needs are our priority.  As part of our continuing mission to provide you with exceptional heart care, we have created designated Provider Care Teams.  These Care Teams include your primary Cardiologist (physician) and Advanced Practice Providers (APPs -  Physician Assistants and Nurse Practitioners) who all work together to provide you with the care you need, when you need it.  We recommend signing up for the patient portal called "MyChart".  Sign up information is provided on this After Visit Summary.  MyChart is used to connect with patients for Virtual Visits (Telemedicine).  Patients are able to view lab/test results, encounter notes, upcoming appointments, etc.  Non-urgent messages can be sent to your provider as well.   To learn more about what you can do with MyChart, go to [email or web address].    Your next appointment:   3 month(s)  The format for your next appointment:   In Person  Provider:   Shirlee More, MD   Other Instructions

## 2019-07-30 ENCOUNTER — Telehealth: Payer: Self-pay

## 2019-07-30 LAB — BASIC METABOLIC PANEL
BUN/Creatinine Ratio: 23 (ref 12–28)
BUN: 24 mg/dL (ref 8–27)
CO2: 22 mmol/L (ref 20–29)
Calcium: 8.7 mg/dL (ref 8.7–10.3)
Chloride: 107 mmol/L — ABNORMAL HIGH (ref 96–106)
Creatinine, Ser: 1.04 mg/dL — ABNORMAL HIGH (ref 0.57–1.00)
GFR calc Af Amer: 62 mL/min/{1.73_m2} (ref >59)
GFR calc non Af Amer: 53 mL/min/{1.73_m2} — ABNORMAL LOW (ref >59)
Glucose: 86 mg/dL (ref 65–99)
Potassium: 4 mmol/L (ref 3.5–5.2)
Sodium: 142 mmol/L (ref 134–144)

## 2019-07-30 LAB — PRO B NATRIURETIC PEPTIDE: NT-Pro BNP: 483 pg/mL — ABNORMAL HIGH (ref 0–301)

## 2019-07-30 NOTE — Telephone Encounter (Signed)
Spoke with patient regarding results and recommendation.  Patient verbalizes understanding and is agreeable to plan of care. Advised patient to call back with any issues or concerns.  

## 2019-07-30 NOTE — Telephone Encounter (Signed)
-----   Message from Richardo Priest, MD sent at 07/30/2019  9:15 AM EDT ----- Normal or stable result  Reduce her diuretic to every other day

## 2019-08-05 ENCOUNTER — Encounter: Payer: Self-pay | Admitting: Physical Medicine & Rehabilitation

## 2019-08-12 DIAGNOSIS — I350 Nonrheumatic aortic (valve) stenosis: Secondary | ICD-10-CM | POA: Diagnosis not present

## 2019-08-12 DIAGNOSIS — I5030 Unspecified diastolic (congestive) heart failure: Secondary | ICD-10-CM | POA: Diagnosis not present

## 2019-08-14 ENCOUNTER — Encounter: Payer: Medicare Other | Admitting: Physical Medicine & Rehabilitation

## 2019-08-21 ENCOUNTER — Other Ambulatory Visit: Payer: Self-pay

## 2019-08-21 ENCOUNTER — Encounter: Payer: Medicare Other | Attending: Physical Medicine & Rehabilitation | Admitting: Physical Medicine & Rehabilitation

## 2019-08-21 ENCOUNTER — Encounter: Payer: Self-pay | Admitting: Physical Medicine & Rehabilitation

## 2019-08-21 VITALS — BP 144/73 | HR 52 | Temp 98.2°F | Ht 65.0 in | Wt 245.6 lb

## 2019-08-21 DIAGNOSIS — Z952 Presence of prosthetic heart valve: Secondary | ICD-10-CM | POA: Diagnosis not present

## 2019-08-21 DIAGNOSIS — G5751 Tarsal tunnel syndrome, right lower limb: Secondary | ICD-10-CM

## 2019-08-21 DIAGNOSIS — M25561 Pain in right knee: Secondary | ICD-10-CM | POA: Diagnosis not present

## 2019-08-21 DIAGNOSIS — G8929 Other chronic pain: Secondary | ICD-10-CM | POA: Insufficient documentation

## 2019-08-21 DIAGNOSIS — M25562 Pain in left knee: Secondary | ICD-10-CM | POA: Diagnosis not present

## 2019-08-21 HISTORY — DX: Tarsal tunnel syndrome, right lower limb: G57.51

## 2019-08-21 NOTE — Patient Instructions (Addendum)
PLEASE FEEL FREE TO CALL OUR OFFICE WITH ANY PROBLEMS OR QUESTIONS ([phone])   VOLTAREN (DICLOFENAC) APPLY 3-4 X DAILY TO YOUR KNEES.   REGULAR ICE TO YOUR KNEES AFTER STANDING OR WALKING   KNEE STRENGTHENING EXERCISES.

## 2019-08-21 NOTE — Progress Notes (Signed)
Subjective:    Patient ID: Elizabeth [name], female    DOB: 1945/03/27, 74 y.o.   MRN: [id number]  HPI   This is an initial office visit for Mrs. Elizabeth [name] who was kindly referred to this office by Dr. Marco Collie for assessment of bilateral leg pain which started about a year ago.  At that time she first noticed numbness in her right foot associated with it. She bought some new shoes at that time to help support the foot. The pain progressed to a general throbbing type of pain which radiates from both knees to the feet, worse with weight bearing and walking.  She goes up the stairs one leg at a time, usually orientating her body sideways to help with the stability and pain.  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    There is not much information available about her past treatments but I was able to find out from the patient that she has been to a pain clinic and multiple other providers for management of the knee pain.  It sounds as if she has had Visco supplementation as well as steroid injections and most recently a left geniculate nerve block in March of this year.  She states that she has had some temporary relief but nothing more.   X-rays have been done but she does not know the specific results of these.  I was not able to find any x-ray images report in the available documentation or electronic medical records.  Apparently an MRI was attempted on 2 occasions but she "blacked out" even with an open units..   For pain relief she usually tries to rest or get off of her legs.  Tylenol does not help very much. she stopped taking ibuprofen due to kidney disease although her recent lab work does show improvement.  She is not use anything topical.  She is not ever used a knee brace apparently.    Of note, she also has had surgery on her back about 2 years ago.  She presented with burning sensations  in both hips.   CT findings from 2019 were as follows: L4-5: Severe multifactorial spinal stenosis secondary to loss of disc height with annular disc bulging eccentric to the left and advanced facet and ligamentous hypertrophy, also worse on the left. Both lateral recesses are narrowed and there is moderate foraminal narrowing on the left.  The patient tells me that Dr. Lynann Bologna performed decompressive surgery to her spine. She states that the back and hip pain preceded any of these recent complaints and was completely different from the symptoms she is having currently.   From a medical standpoint she has had severe aortic stenosis and she ultimately required a TAVR on May 28, 2019.  She seems to have recovered well from the surgery and is feeling much better from a cardiopulmonary standpoint.  She is followed by Dr. Sherren Mocha and Dr. Pamalee Leyden.  The pain has severely impacted [name]'s quality of life.  The pain has gotten to the point where she feels that she just wants to "cut my legs off".   Pain Inventory Average Pain 8 Pain Right Now 9 My pain is throbbing  In the last 24 hours, has pain interfered with the following? General activity 10 Relation with others 10 Enjoyment of life 10 What TIME of day is your  pain at its worst? daytime night Sleep (in general) Poor  Pain is worse with: walking and standing Pain improves with: nothing Relief from Meds: na  Mobility how many minutes can you walk? 10 do you drive?  yes  Function retired  Neuro/Psych trouble walking  Prior Studies new pt  Physicians involved in your care new pt   Family History  Problem Relation Age of Onset  . Alzheimer's disease Mother   . Aneurysm Father   . Stomach cancer Brother    Social History   Socioeconomic History  . Marital status: Married    Spouse name: Not on file  . Number of children: 2  . Years of education: 84  . Highest education level: Not on file    Occupational History  . Occupation: child care  Tobacco Use  . Smoking status: Never Smoker  . Smokeless tobacco: Never Used  Substance and Sexual Activity  . Alcohol use: No  . Drug use: No  . Sexual activity: Not on file  Other Topics Concern  . Not on file  Social History Narrative   Lives with husband in a one story home.  Has 2 children.  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  Education: high school.     Social Determinants of Health   Financial Resource Strain:   . Difficulty of Paying Living Expenses:   Food Insecurity:   . Worried About Charity fundraiser in the Last Year:   . Arboriculturist in the Last Year:   Transportation Needs:   . Film/video editor (Medical):   Marland Kitchen Lack of Transportation (Non-Medical):   Physical Activity:   . Days of Exercise per Week:   . Minutes of Exercise per Session:   Stress:   . Feeling of Stress :   Social Connections:   . Frequency of Communication with Friends and Family:   . Frequency of Social Gatherings with Friends and Family:   . Attends Religious Services:   . Active Member of Clubs or Organizations:   . Attends Archivist Meetings:   Marland Kitchen Marital Status:    Past Surgical History:  Procedure Laterality Date  . ABDOMINAL HYSTERECTOMY    . ABDOMINAL HYSTERECTOMY    . APPENDECTOMY    . BACK SURGERY    . CARDIAC CATHETERIZATION  03/11/2019  . CHOLECYSTECTOMY    . GALLBLADDER SURGERY    . INTRAOPERATIVE TRANSTHORACIC ECHOCARDIOGRAM  05/28/2019   Procedure: Intraoperative Transthoracic Echocardiogram;  Surgeon: Sherren Mocha, MD;  Location: [place];  Service: Open Heart Surgery;;  . IR RADIOLOGY PERIPHERAL GUIDED IV START  05/14/2019  . IR US GUIDE VASC ACCESS RIGHT  05/14/2019  . LUMBAR LAMINECTOMY/DECOMPRESSION MICRODISCECTOMY N/A 06/12/2017   Procedure: LAMINECTOMY AND FORAMINOTOMY LUMBAR THREE- LUMBAR FOUR, LUMBAR FOUR- LUMBAR FIVE;  Surgeon: Newman Pies, MD;  Location: [place];  Service: Neurosurgery;  Laterality:  N/A;  . RIGHT/LEFT HEART CATH AND CORONARY ANGIOGRAPHY N/A 03/11/2019   Procedure: RIGHT/LEFT HEART CATH AND CORONARY ANGIOGRAPHY;  Surgeon: Sherren Mocha, MD;  Location: [place] [facility];  Service: Cardiovascular;  Laterality: N/A;  . TONSILLECTOMY    . TRANSCATHETER AORTIC VALVE REPLACEMENT, TRANSFEMORAL N/A 05/28/2019   Procedure: TRANSCATHETER AORTIC VALVE REPLACEMENT, TRANSFEMORAL;  Surgeon: Sherren Mocha, MD;  Location: [place];  Service: Open Heart Surgery;  Laterality: N/A;   Past Medical History:  Diagnosis Date  . Anxiety    extreme  . Benign hypertension with CKD (chronic kidney disease), stage II   .  Breast cancer ([place])    Pre-cancerous cell in Left breast  . Chronic diastolic heart failure ([place]) 12/20/2018  . Chronic pain of both knees 09/12/2017   Ms. Shanaya Schneck is a 74 y.o. female with low back pain, bilateral knee pain, due to multilevel multifactorial degenerative changes in the lumbar spine, and, significant multiple joint osteoarthritis.   Last Assessment & Plan:  Today I will treat with bilateral knee injections.   Ms. Omara will return to the clinic in 6 months.  I will asses the efficacy of this treatment and make adjustments to her   . CKD (chronic kidney disease), stage III   . Claustrophobia   . Ductal carcinoma in situ (DCIS) of left breast 11/08/2017  . GERD (gastroesophageal reflux disease)   . History of kidney stones   . Hypertension 09/25/2018  . Hyperuricemia   . Insomnia 09/25/2018  . Lumbar radiculopathy 11/28/2017   Ms. Kealy Lewter is a 74 y.o. female with low back pain, lower extremity pain, on the left, in the setting of previous laminotomy in April 2019.   Last Assessment & Plan:  Today I will treat with current OTC medications..   Ms. Cowman will return to the clinic on an as needed basis.  I will asses the efficacy of this treatment and make adjustments to her treatment plan as necessary.  Last Assessment &  . Lumbar stenosis   . Osteoarthrosis   . S/P  TAVR (transcatheter aortic valve replacement) 05/28/2019   s/p TAVR with a 29 mm Medtronic Evolut Pro + via the TF approach by Dr. Burt Knack and Dr. Cyndia Bent   . Severe aortic stenosis   . Tachycardia 09/25/2018  . Vitamin D deficiency    BP (!) 144/73   Pulse (!) 52   Temp 98.2 F (36.8 C)   Ht 5\' 5"  (1.651 m)   Wt 245 lb 9.6 oz (111.4 kg)   SpO2 99%   BMI 40.87 kg/m   Opioid Risk Score:   Fall Risk Score:  `1  Depression screen PHQ 2/9  Depression screen PHQ 2/9 08/21/2019  Decreased Interest 0  Down, Depressed, Hopeless 0  PHQ - 2 Score 0  Altered sleeping 3  Tired, decreased energy 2  Change in appetite 0  Feeling bad or failure about yourself  0  Trouble concentrating 0  Moving slowly or fidgety/restless 0  Suicidal thoughts 0  PHQ-9 Score 5  Difficult doing work/chores Not difficult at all    Review of Systems  Musculoskeletal: Positive for gait problem.  Neurological: Positive for numbness.  All other systems reviewed and are negative.      Objective:   Physical Exam  General: Alert and oriented x 3, No apparent distress.  Obese HEENT: Head is normocephalic, atraumatic, PERRLA, EOMI, sclera anicteric, oral mucosa pink and moist, dentition intact, ext ear canals clear,  Neck: Supple without JVD or lymphadenopathy Heart: Reg rate and rhythm. No murmurs rubs or gallops Chest: CTA bilaterally without wheezes, rales, or rhonchi; no distress Abdomen: Soft, non-tender, non-distended, bowel sounds positive. Extremities: No clubbing, cyanosis, or edema. Pulses are 2+ Skin: Clean and intact without signs of breakdown Neuro: Pt is cognitively appropriate with normal insight, memory, and awareness. Cranial nerves 2-12 are intact. Sensory exam is normal except for the right foot which is numb to light touch on the sole and as well as to a lesser extent the dorsum of the foot.  Left foot was normal.   Fine motor coordination is intact.  No tremors. Motor function is grossly 5/5  except for pain inhibition weakness at the knees.  Reflexes are 1+..  Musculoskeletal: Both knees with effusions.  Significant crepitus in both knees with extension and flexion.  She has medial greater than lateral joint line pain bilaterally.  McMurray's test positive for medial joint line pain.  She is antalgic with weightbearing and needs extra time for support when she stands up initially.  Gait slightly wide-based.  Bilateral pes planus deformities right greater than left Psych: Pt's affect is appropriate. Pt is cooperative         Assessment & Plan:  1.  Chronic bilateral knee pain, left greater than right.  Symptoms most consistent with osteoarthritis as well as meniscal disease. 2.  Right foot numbness, questionable tarsal tunnel syndrome given her pes planus deformity 3.  History of lumbar spondylosis with radiculopathy status post lumbar decompression in approximately 2019 4. Obesity 5. AS with recent TAVR  Plan: 1.  Patient really needs MRIs of both knees.  But first need to see most recent imaging as well as her nerve conduction EMG.  If she goes for MRI she will need some type of sedation or anxiety lytic given her history.  I will reach out to cardiology regarding the ability to have the MRIs given her recent TAVR 2.  Trial of Voltaren gel to bilateral knees, OTC 3.  Knee strengthening program was provided and described today. 4.  Appropriate shoe wear and arch supports as she is using currently 5.  Consider further joint injections and other interventions based upon x-rays and ultimately her MRIs  Forty-five minutes of face to face patient care time were spent during this visit. All questions were encouraged and answered. Follow up with me in 6 weeks.

## 2019-09-09 ENCOUNTER — Other Ambulatory Visit: Payer: Self-pay | Admitting: Cardiology

## 2019-09-11 DIAGNOSIS — M791 Myalgia, unspecified site: Secondary | ICD-10-CM | POA: Diagnosis not present

## 2019-09-11 DIAGNOSIS — I350 Nonrheumatic aortic (valve) stenosis: Secondary | ICD-10-CM | POA: Diagnosis not present

## 2019-09-19 DIAGNOSIS — I5030 Unspecified diastolic (congestive) heart failure: Secondary | ICD-10-CM | POA: Diagnosis not present

## 2019-09-19 DIAGNOSIS — N183 Chronic kidney disease, stage 3 unspecified: Secondary | ICD-10-CM | POA: Diagnosis not present

## 2019-09-25 ENCOUNTER — Encounter: Payer: Self-pay | Admitting: Physical Medicine & Rehabilitation

## 2019-09-25 ENCOUNTER — Encounter: Payer: Medicare Other | Attending: Physical Medicine & Rehabilitation | Admitting: Physical Medicine & Rehabilitation

## 2019-09-25 ENCOUNTER — Other Ambulatory Visit: Payer: Self-pay

## 2019-09-25 VITALS — BP 131/81 | HR 59 | Temp 97.5°F | Ht 66.0 in | Wt 247.0 lb

## 2019-09-25 DIAGNOSIS — G5751 Tarsal tunnel syndrome, right lower limb: Secondary | ICD-10-CM | POA: Diagnosis not present

## 2019-09-25 DIAGNOSIS — Z952 Presence of prosthetic heart valve: Secondary | ICD-10-CM | POA: Insufficient documentation

## 2019-09-25 DIAGNOSIS — M25562 Pain in left knee: Secondary | ICD-10-CM | POA: Diagnosis not present

## 2019-09-25 DIAGNOSIS — M25561 Pain in right knee: Secondary | ICD-10-CM

## 2019-09-25 DIAGNOSIS — G8929 Other chronic pain: Secondary | ICD-10-CM | POA: Diagnosis not present

## 2019-09-25 NOTE — Progress Notes (Signed)
Subjective:    Patient ID: Elizabeth [name], female    DOB: 16-Nov-1945, 74 y.o.   MRN: [id number]  HPI   Elizabeth [name] is here in follow up of her bilateral leg pain. I first saw her last month. Since we last met, she lost her husband which has expectedly been a struggle for her. I had started her on voltaren gel and knee exercises which she felt were helping. But things were turned upside down when her husband suddenly passed.   I have not received any studies from her primary's office. She thinks she had viscosupplementation last Fall.    Pain Inventory Average Pain 7 Pain Right Now 7 My pain is constant and throbbing  In the last 24 hours, has pain interfered with the following? General activity 7 Relation with others 0 Enjoyment of life 7 What TIME of day is your pain at its worst? morning Sleep (in general) Poor  Pain is worse with: walking and standing Pain improves with: rest Relief from Meds: 4  Mobility walk without assistance how many minutes can you walk? 5 mins ability to climb steps?  yes do you drive?  yes Do you have any goals in this area?  yes  Function retired Do you have any goals in this area?  yes  Neuro/Psych weakness trouble walking depression anxiety  Prior Studies Any changes since last visit?  no  Physicians involved in your care Any changes since last visit?  no   Family History  Problem Relation Age of Onset  . Alzheimer's disease Mother   . Aneurysm Father   . Stomach cancer Brother    Social History   Socioeconomic History  . Marital status: Married    Spouse name: Not on file  . Number of children: 2  . Years of education: 21  . Highest education level: Not on file  Occupational History  . Occupation: child care  Tobacco Use  . Smoking status: Never Smoker  . Smokeless tobacco: Never Used  Vaping Use  . Vaping Use: Never used  Substance and Sexual Activity  . Alcohol use: No  . Drug use: No  . Sexual activity:  Not on file  Other Topics Concern  . Not on file  Social History Narrative   Lives with husband in a one story home.  Has 2 children.  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  Education: high school.     Social Determinants of Health   Financial Resource Strain:   . Difficulty of Paying Living Expenses:   Food Insecurity:   . Worried About Charity fundraiser in the Last Year:   . Arboriculturist in the Last Year:   Transportation Needs:   . Film/video editor (Medical):   Marland Kitchen Lack of Transportation (Non-Medical):   Physical Activity:   . Days of Exercise per Week:   . Minutes of Exercise per Session:   Stress:   . Feeling of Stress :   Social Connections:   . Frequency of Communication with Friends and Family:   . Frequency of Social Gatherings with Friends and Family:   . Attends Religious Services:   . Active Member of Clubs or Organizations:   . Attends Archivist Meetings:   Marland Kitchen Marital Status:    Past Surgical History:  Procedure Laterality Date  . ABDOMINAL HYSTERECTOMY    . ABDOMINAL HYSTERECTOMY    . APPENDECTOMY    . BACK SURGERY    .  CARDIAC CATHETERIZATION  03/11/2019  . CHOLECYSTECTOMY    . GALLBLADDER SURGERY    . INTRAOPERATIVE TRANSTHORACIC ECHOCARDIOGRAM  05/28/2019   Procedure: Intraoperative Transthoracic Echocardiogram;  Surgeon: Sherren Mocha, MD;  Location: [facility];  Service: Open Heart Surgery;;  . IR RADIOLOGY PERIPHERAL GUIDED IV START  05/14/2019  . IR US GUIDE VASC ACCESS RIGHT  05/14/2019  . LUMBAR LAMINECTOMY/DECOMPRESSION MICRODISCECTOMY N/A 06/12/2017   Procedure: LAMINECTOMY AND FORAMINOTOMY LUMBAR THREE- LUMBAR FOUR, LUMBAR FOUR- LUMBAR FIVE;  Surgeon: Newman Pies, MD;  Location: [facility];  Service: Neurosurgery;  Laterality: N/A;  . RIGHT/LEFT HEART CATH AND CORONARY ANGIOGRAPHY N/A 03/11/2019   Procedure: RIGHT/LEFT HEART CATH AND CORONARY ANGIOGRAPHY;  Surgeon: Sherren Mocha, MD;  Location: [facility];  Service: Cardiovascular;   Laterality: N/A;  . TONSILLECTOMY    . TRANSCATHETER AORTIC VALVE REPLACEMENT, TRANSFEMORAL N/A 05/28/2019   Procedure: TRANSCATHETER AORTIC VALVE REPLACEMENT, TRANSFEMORAL;  Surgeon: Sherren Mocha, MD;  Location: [facility];  Service: Open Heart Surgery;  Laterality: N/A;   Past Medical History:  Diagnosis Date  . Anxiety    extreme  . Benign hypertension with CKD (chronic kidney disease), stage II   . Breast cancer ([place])    Pre-cancerous cell in Left breast  . Chronic diastolic heart failure ([place]) 12/20/2018  . Chronic pain of both knees 09/12/2017   Elizabeth [name] is a 74 y.o. female with low back pain, bilateral knee pain, due to multilevel multifactorial degenerative changes in the lumbar spine, and, significant multiple joint osteoarthritis.   Last Assessment & Plan:  Today I will treat with bilateral knee injections.   Elizabeth [name] will return to the clinic in 6 months.  I will asses the efficacy of this treatment and make adjustments to her   . CKD (chronic kidney disease), stage III   . Claustrophobia   . Ductal carcinoma in situ (DCIS) of left breast 11/08/2017  . GERD (gastroesophageal reflux disease)   . History of kidney stones   . Hypertension 09/25/2018  . Hyperuricemia   . Insomnia 09/25/2018  . Lumbar radiculopathy 11/28/2017   Elizabeth [name] is a 74 y.o. female with low back pain, lower extremity pain, on the left, in the setting of previous laminotomy in April 2019.   Last Assessment & Plan:  Today I will treat with current OTC medications..   Elizabeth [name] will return to the clinic on an as needed basis.  I will asses the efficacy of this treatment and make adjustments to her treatment plan as necessary.  Last Assessment &  . Lumbar stenosis   . Osteoarthrosis   . S/P TAVR (transcatheter aortic valve replacement) 05/28/2019   s/p TAVR with a 29 mm Medtronic Evolut Pro + via the TF approach by Dr. Burt Knack and Dr. Cyndia Bent   . Severe aortic stenosis   . Tachycardia 09/25/2018  . Vitamin D  deficiency    BP 131/81   Pulse (!) 59   Temp (!) 97.5 F (36.4 C)   Ht _0  (1.676 m)   Wt 247 lb (112 kg)   SpO2 99%   BMI 39.87 kg/m   Opioid Risk Score:   Fall Risk Score:    Depression screen PHQ 2/9  Depression screen PHQ 2/9 08/21/2019  Decreased Interest 0  Down, Depressed, Hopeless 0  PHQ - 2 Score 0  Altered sleeping 3  Tired, decreased energy 2  Change in appetite 0  Feeling bad or failure about yourself  0  Trouble  concentrating 0  Moving slowly or fidgety/restless 0  Suicidal thoughts 0  PHQ-9 Score 5  Difficult doing work/chores Not difficult at all   Review of Systems  Constitutional: Negative.   HENT: Negative.   Eyes: Negative.   Cardiovascular: Negative.   Gastrointestinal: Negative.   Endocrine: Negative.   Genitourinary: Negative.   Musculoskeletal: Positive for gait problem.       Left leg left foot pain  Skin: Negative.   Allergic/Immunologic: Negative.   Neurological: Positive for weakness.       Left leg & left foot  Psychiatric/Behavioral:       Anxitey, depression       Objective:   Physical Exam General: No acute distress HEENT: EOMI, oral membranes moist Cards: reg rate  Chest: normal effort Abdomen: Soft, NT, ND Skin: dry, intact Extremities: no edema  Neuro: Pt is cognitively appropriate with normal insight, memory, and awareness. Cranial nerves 2-12 are intact. Right foot numbness along sole. Motor 5/5.  Musculoskeletal: bilateral knee effusions, pes planus deformities. mcmurray's positive with joint line pain bilateraly Psych: tearful, depresed             Assessment & Plan:  1.  Chronic bilateral knee pain, left greater than right.  Symptoms most consistent with osteoarthritis as well as meniscal disease. 2.  Right foot numbness, questionable tarsal tunnel syndrome given her pes planus deformity 3.  History of lumbar spondylosis with radiculopathy status post lumbar decompression in approximately 2019 4.  Obesity 5. AS with recent TAVR   Plan: 1.  Await EMG/NCS report. Will send her for MRI's of knees when she's emotionally ready. May need sedative prior to study 2.  Continue Voltaren gel to bilateral knees, OTC 3.  Knee strengthening program to continue. 4.  Appropriate shoe wear and arch supports as she is using currently 5.  set up for bilateral monovisc

## 2019-09-25 NOTE — Patient Instructions (Signed)
1. Gel injections to both knees 2. Continue with knee exercises 3. Regular ice to both knees 4. Voltaren Gel to both knees 5. I'll review EMG/nerve tests 6. Arrange MRI's when you're ready   PLEASE FEEL FREE TO CALL OUR OFFICE WITH ANY PROBLEMS OR QUESTIONS ([phone])

## 2019-10-10 ENCOUNTER — Other Ambulatory Visit: Payer: Self-pay | Admitting: Hematology and Oncology

## 2019-10-10 DIAGNOSIS — Z853 Personal history of malignant neoplasm of breast: Secondary | ICD-10-CM

## 2019-10-11 ENCOUNTER — Telehealth: Payer: Self-pay | Admitting: Hematology and Oncology

## 2019-10-11 NOTE — Telephone Encounter (Signed)
Rescheduled appointment per 7/30 patient request. Patient is aware of new appointment date and time.

## 2019-10-13 DIAGNOSIS — I5030 Unspecified diastolic (congestive) heart failure: Secondary | ICD-10-CM | POA: Diagnosis not present

## 2019-10-13 DIAGNOSIS — N183 Chronic kidney disease, stage 3 unspecified: Secondary | ICD-10-CM | POA: Diagnosis not present

## 2019-10-27 NOTE — Progress Notes (Signed)
Cardiology Office Note:    Date:  10/29/2019   ID:  Elizabeth [name], Elizabeth [name] March 25, 1945, MRN [id number]  PCP:  Marco Collie, MD  Cardiologist:  Shirlee More, MD    Referring MD: Marco Collie, MD    ASSESSMENT:    1. S/P TAVR (transcatheter aortic valve replacement)   2. Mild CAD   3. Chronic diastolic heart failure ([place])   4. Hypertension, renal disease, stage 1-4 or unspecified chronic kidney disease   5. Mixed hyperlipidemia    PLAN:    In order of problems listed above:  1. Stable at 6 months will drop off Plavix and continue aspirin 2. Stable we will have her resume a low intensity statin with mild CAD follow-up labs 6 weeks 3. Heart failure is compensated at present continue her current minimal loop diuretic we will recheck renal function in 6 weeks 4. Her blood pressure is mildly elevated but in the context of severe emotional distress not can alter her medications at this time continue calcium channel blocker 5. Restart statin   Next appointment: 6 months   Medication Adjustments/Labs and Tests Ordered: Current medicines are reviewed at length with the patient today.  Concerns regarding medicines are outlined above.  No orders of the defined types were placed in this encounter.  Meds ordered this encounter  Medications  . pravastatin (PRAVACHOL) 20 MG tablet    Sig: Take 1 tablet (20 mg total) by mouth every evening.    Dispense:  90 tablet    Refill:  3    No chief complaint on file.   History of Present Illness:    Elizabeth [name] is a 74 y.o. female with a hx of aortic stenosis with TAVR 05/28/2019.  Other problems include hypertension stage II CKD and heart failure.  She was last seen 07/29/2019. Compliance with diet, lifestyle and medications: Yes  Sadly her husband recently died very emotionally distressed and struggling with all the issues.  Home blood pressures running in the 1 00-8 50 systolic range repeat by me 142/60 in the office.  Her  heart is broken but has no chest pain edema shortness of breath palpitation or syncope.  6 months after TAVR she will drop off clopidogrel.  We had stopped high intensity statin she had pruritus and agrees to try pravastatin we will place her on a low dose with a follow-up lipid profile in [redacted] weeks along with a comprehensive panel   Ref Range & Units 3 mo ago 6 mo ago 8 mo ago  NT-Pro BNP 0 - 301 pg/mL 483High  669High CM  642High    Past Medical History:  Diagnosis Date  . Anxiety    extreme  . Benign hypertension with CKD (chronic kidney disease), stage II   . Breast cancer ([place])    Pre-cancerous cell in Left breast  . Chronic diastolic heart failure ([place]) 12/20/2018  . Chronic pain of both knees 09/12/2017   Elizabeth [name] is a 74 y.o. female with low back pain, bilateral knee pain, due to multilevel multifactorial degenerative changes in the lumbar spine, and, significant multiple joint osteoarthritis.   Last Assessment & Plan:  Today I will treat with bilateral knee injections.   Elizabeth [name] will return to the clinic in 6 months.  I will asses the efficacy of this treatment and make adjustments to her   . CKD (chronic kidney disease), stage III   . Claustrophobia   . Ductal carcinoma in situ (DCIS) of left  breast 11/08/2017  . GERD (gastroesophageal reflux disease)   . History of kidney stones   . Hypertension 09/25/2018  . Hyperuricemia   . Insomnia 09/25/2018  . Lumbar radiculopathy 11/28/2017   Elizabeth [name] is a 74 y.o. female with low back pain, lower extremity pain, on the left, in the setting of previous laminotomy in April 2019.   Last Assessment & Plan:  Today I will treat with current OTC medications..   Elizabeth [name] will return to the clinic on an as needed basis.  I will asses the efficacy of this treatment and make adjustments to her treatment plan as necessary.  Last Assessment &  . Lumbar stenosis   . Osteoarthrosis   . S/P TAVR (transcatheter aortic valve replacement)  05/28/2019   s/p TAVR with a 29 mm Medtronic Evolut Pro + via the TF approach by Dr. Burt Knack and Dr. Cyndia Bent   . Severe aortic stenosis   . Tachycardia 09/25/2018  . Vitamin D deficiency     Past Surgical History:  Procedure Laterality Date  . ABDOMINAL HYSTERECTOMY    . ABDOMINAL HYSTERECTOMY    . APPENDECTOMY    . BACK SURGERY    . CARDIAC CATHETERIZATION  03/11/2019  . CHOLECYSTECTOMY    . GALLBLADDER SURGERY    . INTRAOPERATIVE TRANSTHORACIC ECHOCARDIOGRAM  05/28/2019   Procedure: Intraoperative Transthoracic Echocardiogram;  Surgeon: Sherren Mocha, MD;  Location: [facility];  Service: Open Heart Surgery;;  . IR RADIOLOGY PERIPHERAL GUIDED IV START  05/14/2019  . IR US GUIDE VASC ACCESS RIGHT  05/14/2019  . LUMBAR LAMINECTOMY/DECOMPRESSION MICRODISCECTOMY N/A 06/12/2017   Procedure: LAMINECTOMY AND FORAMINOTOMY LUMBAR THREE- LUMBAR FOUR, LUMBAR FOUR- LUMBAR FIVE;  Surgeon: Newman Pies, MD;  Location: [facility];  Service: Neurosurgery;  Laterality: N/A;  . RIGHT/LEFT HEART CATH AND CORONARY ANGIOGRAPHY N/A 03/11/2019   Procedure: RIGHT/LEFT HEART CATH AND CORONARY ANGIOGRAPHY;  Surgeon: Sherren Mocha, MD;  Location: [facility];  Service: Cardiovascular;  Laterality: N/A;  . TONSILLECTOMY    . TRANSCATHETER AORTIC VALVE REPLACEMENT, TRANSFEMORAL N/A 05/28/2019   Procedure: TRANSCATHETER AORTIC VALVE REPLACEMENT, TRANSFEMORAL;  Surgeon: Sherren Mocha, MD;  Location: [facility];  Service: Open Heart Surgery;  Laterality: N/A;    Current Medications: Current Meds  Medication Sig  . amLODipine (NORVASC) 5 MG tablet TAKE 1 TABLET(5 MG) BY MOUTH DAILY  . aspirin EC 81 MG tablet Take 1 tablet (81 mg total) by mouth daily.  . carvedilol (COREG) 6.25 MG tablet Take 1 tablet (6.25 mg total) by mouth 2 (two) times daily with a meal.  . clopidogrel (PLAVIX) 75 MG tablet Take 1 tablet (75 mg total) by mouth daily with breakfast.  . furosemide (LASIX) 20 MG tablet Take 20 mg by mouth every other  day.  . ibuprofen (ADVIL) 800 MG tablet Take 800 mg by mouth every 8 (eight) hours as needed.  Marland Kitchen lisinopril (ZESTRIL) 10 MG tablet Take 1 tablet (10 mg total) by mouth daily.  . pantoprazole (PROTONIX) 40 MG tablet Take 40 mg by mouth daily.  . tamoxifen (NOLVADEX) 20 MG tablet TAKE 1 TABLET(20 MG) BY MOUTH DAILY     Allergies:   Penicillins, Atorvastatin, Codeine, and Prednisone   Social History   Socioeconomic History  . Marital status: Married    Spouse name: Not on file  . Number of children: 2  . Years of education: 73  . Highest education level: Not on file  Occupational History  . Occupation: child care  Tobacco Use  .  Smoking status: Never Smoker  . Smokeless tobacco: Never Used  Vaping Use  . Vaping Use: Never used  Substance and Sexual Activity  . Alcohol use: No  . Drug use: No  . Sexual activity: Not on file  Other Topics Concern  . Not on file  Social History Narrative   Lives with husband in a one story home.  Has 2 children.  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  Education: high school.     Social Determinants of Health   Financial Resource Strain:   . Difficulty of Paying Living Expenses:   Food Insecurity:   . Worried About Charity fundraiser in the Last Year:   . Arboriculturist in the Last Year:   Transportation Needs:   . Film/video editor (Medical):   Marland Kitchen Lack of Transportation (Non-Medical):   Physical Activity:   . Days of Exercise per Week:   . Minutes of Exercise per Session:   Stress:   . Feeling of Stress :   Social Connections:   . Frequency of Communication with Friends and Family:   . Frequency of Social Gatherings with Friends and Family:   . Attends Religious Services:   . Active Member of Clubs or Organizations:   . Attends Archivist Meetings:   Marland Kitchen Marital Status:      Family History: The patient's family history includes Alzheimer's disease in her mother; Aneurysm in her father; Stomach cancer in her brother. ROS:     Please see the history of present illness.    All other systems reviewed and are negative.  EKGs/Labs/Other Studies Reviewed:    The following studies were reviewed today  Recent Labs: 05/24/2019: ALT 13; B Natriuretic Peptide 121.3 05/29/2019: Hemoglobin 10.2; Magnesium 1.9; Platelets 122 07/29/2019: BUN 24; Creatinine, Ser 1.04; NT-Pro BNP 483; Potassium 4.0; Sodium 142  Recent Lipid Panel 03/28/2019 cholesterol 166 HDL 49 LDL 107  Physical Exam:    VS:  BP (!) 185/68   Pulse 69   Ht 5\' 6"  (1.676 m)   Wt 244 lb (110.7 kg)   SpO2 96%   BMI 39.38 kg/m     Wt Readings from Last 3 Encounters:  10/29/19 244 lb (110.7 kg)  09/25/19 247 lb (112 kg)  08/21/19 245 lb 9.6 oz (111.4 kg)     GEN:  Well nourished, well developed in no acute distress very distressed crying HEENT: Normal NECK: No JVD; No carotid bruits LYMPHATICS: No lymphadenopathy CARDIAC: RRR, no murmurs, rubs, gallops RESPIRATORY:  Clear to auscultation without rales, wheezing or rhonchi  ABDOMEN: Soft, non-tender, non-distended MUSCULOSKELETAL:  No edema; No deformity  SKIN: Warm and dry NEUROLOGIC:  Alert and oriented x 3 PSYCHIATRIC:  Normal affect    Signed, Shirlee More, MD  10/29/2019 2:22 PM    [facility]

## 2019-10-29 ENCOUNTER — Encounter: Payer: Self-pay | Admitting: Cardiology

## 2019-10-29 ENCOUNTER — Other Ambulatory Visit: Payer: Self-pay

## 2019-10-29 ENCOUNTER — Ambulatory Visit: Payer: Medicare Other | Admitting: Cardiology

## 2019-10-29 VITALS — BP 185/68 | HR 69 | Ht 66.0 in | Wt 244.0 lb

## 2019-10-29 DIAGNOSIS — E782 Mixed hyperlipidemia: Secondary | ICD-10-CM

## 2019-10-29 DIAGNOSIS — I2510 Atherosclerotic heart disease of native coronary artery without angina pectoris: Secondary | ICD-10-CM

## 2019-10-29 DIAGNOSIS — I129 Hypertensive chronic kidney disease with stage 1 through stage 4 chronic kidney disease, or unspecified chronic kidney disease: Secondary | ICD-10-CM | POA: Diagnosis not present

## 2019-10-29 DIAGNOSIS — I5032 Chronic diastolic (congestive) heart failure: Secondary | ICD-10-CM

## 2019-10-29 DIAGNOSIS — Z952 Presence of prosthetic heart valve: Secondary | ICD-10-CM | POA: Diagnosis not present

## 2019-10-29 MED ORDER — PRAVASTATIN SODIUM 20 MG PO TABS: 20.0000 mg | ORAL_TABLET | Freq: Every evening | ORAL | 3 refills | Status: DC

## 2019-10-29 NOTE — Patient Instructions (Signed)
Medication Instructions:  Your physician has recommended you make the following change in your medication:  START: Pravastatin 20 mg take one tablet by mouth daily.  STOP: Plavix on 11/27/2019  Please continue your Aspirin even after stopping your Plavix.  *If you need a refill on your cardiac medications before your next appointment, please call your pharmacy*   Lab Work: Your physician recommends that you return for lab work in: 6 weeks If you have labs (blood work) drawn today and your tests are completely normal, you will receive your results only by: Marland Kitchen MyChart Message (if you have MyChart) OR . A paper copy in the mail If you have any lab test that is abnormal or we need to change your treatment, we will call you to review the results.   Testing/Procedures: None   Follow-Up: At [facility], you and your health needs are our priority.  As part of our continuing mission to provide you with exceptional heart care, we have created designated Provider Care Teams.  These Care Teams include your primary Cardiologist (physician) and Advanced Practice Providers (APPs -  Physician Assistants and Nurse Practitioners) who all work together to provide you with the care you need, when you need it.  We recommend signing up for the patient portal called "MyChart".  Sign up information is provided on this After Visit Summary.  MyChart is used to connect with patients for Virtual Visits (Telemedicine).  Patients are able to view lab/test results, encounter notes, upcoming appointments, etc.  Non-urgent messages can be sent to your provider as well.   To learn more about what you can do with MyChart, go to [email or web address].    Your next appointment:   6 month(s)  The format for your next appointment:   In Person  Provider:   Shirlee More, MD   Other Instructions

## 2019-11-11 ENCOUNTER — Ambulatory Visit
Admission: RE | Admit: 2019-11-11 | Discharge: 2019-11-11 | Disposition: A | Discharge status: Home / Self Care | Payer: Medicare Other | Source: Ambulatory Visit | Attending: Hematology and Oncology | Admitting: Hematology and Oncology

## 2019-11-11 ENCOUNTER — Other Ambulatory Visit: Payer: Self-pay

## 2019-11-11 ENCOUNTER — Ambulatory Visit: Payer: Medicare Other | Admitting: Hematology and Oncology

## 2019-11-11 DIAGNOSIS — Z853 Personal history of malignant neoplasm of breast: Secondary | ICD-10-CM

## 2019-11-11 DIAGNOSIS — R928 Other abnormal and inconclusive findings on diagnostic imaging of breast: Secondary | ICD-10-CM | POA: Diagnosis not present

## 2019-11-11 IMAGING — MG DIGITAL DIAGNOSTIC BILAT W/ TOMO W/ CAD
6 of 10 series · 6 of 30 positions shown · non-contrast
Comparison: Previous exam(s).

CLINICAL DATA: Left breast DCIS diagnosed in [DATE]. The
patient is taking tamoxifen. She has not had surgery or any other
treatment.

EXAM:
DIGITAL DIAGNOSTIC BILATERAL MAMMOGRAM WITH TOMO AND CAD

[R CC synth-2D (1 of 2)]
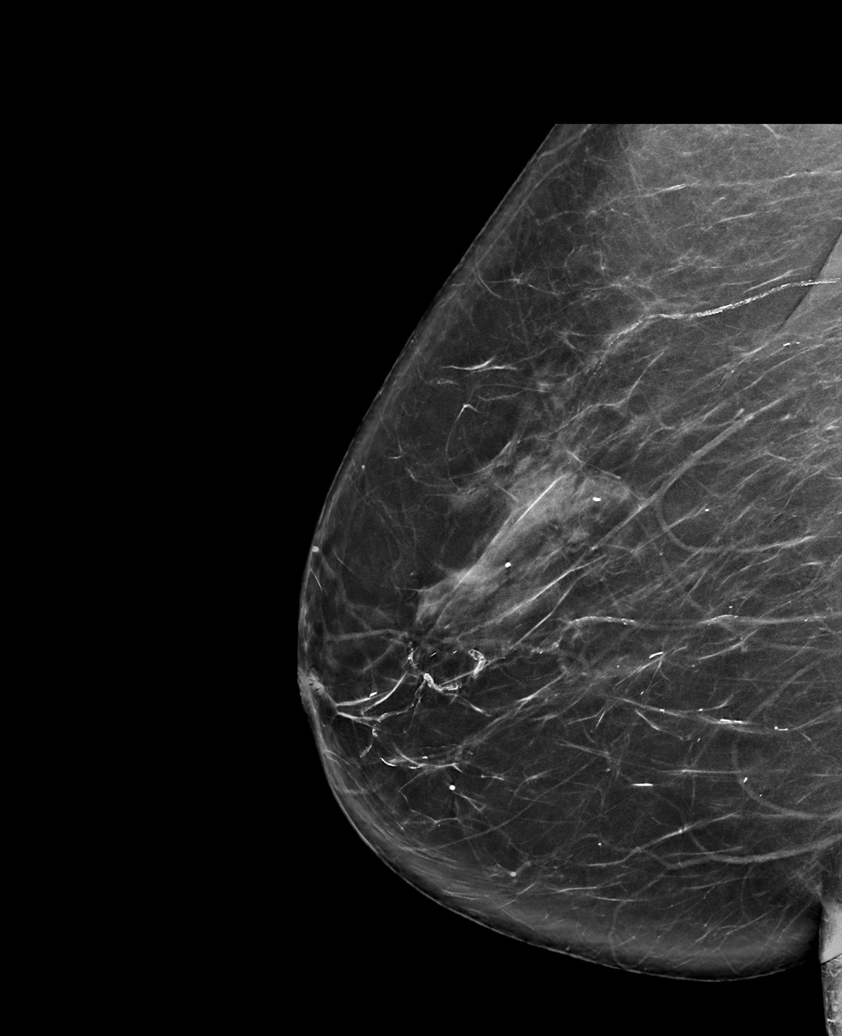

[L MLO synth-2D]
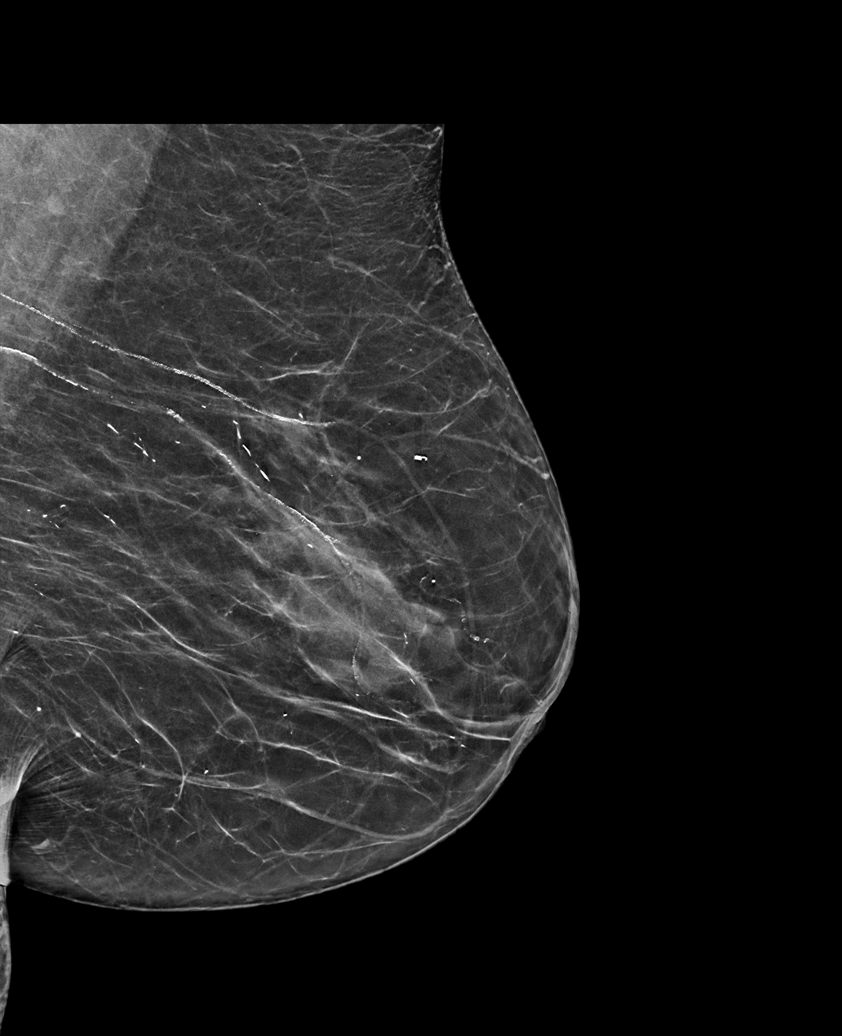

[L CC synth-2D]
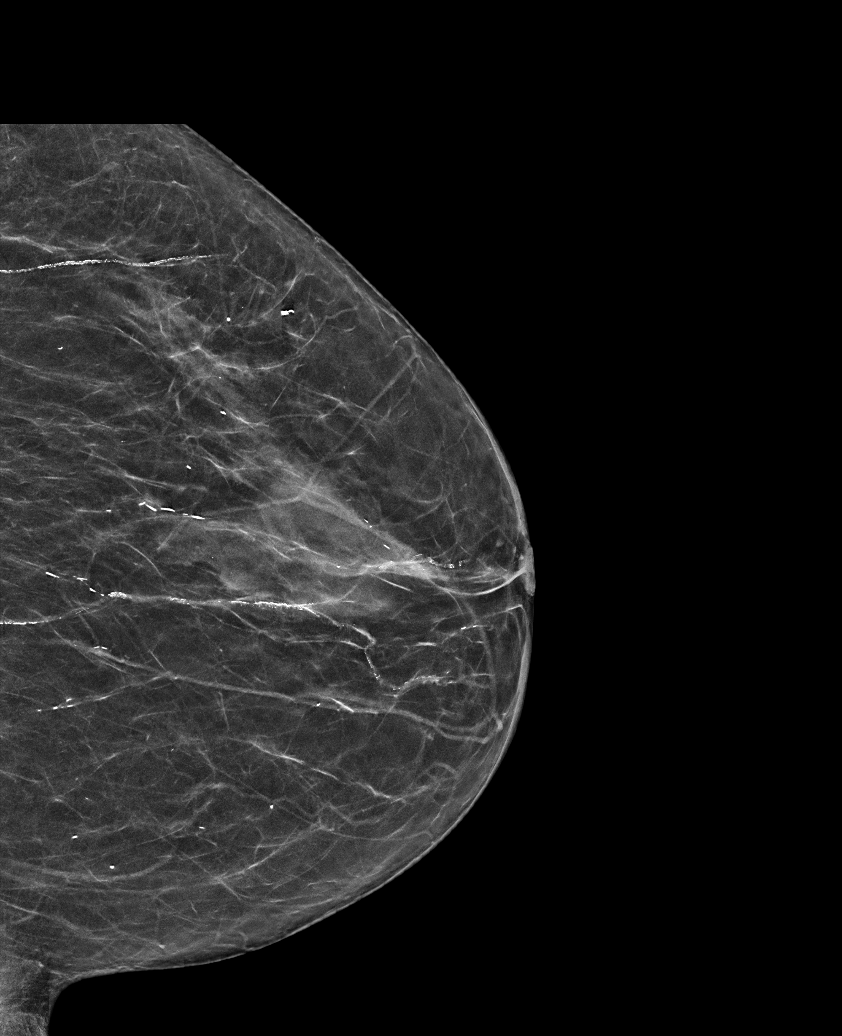

[R MLO synth-2D]
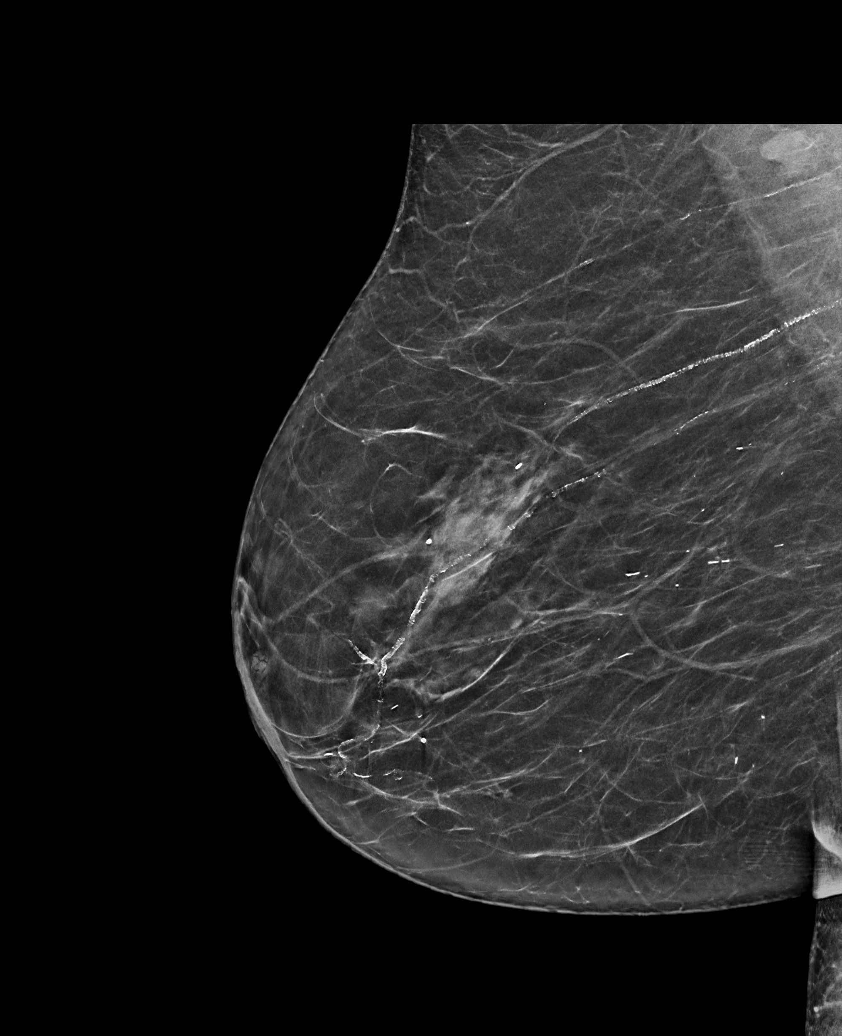

[R CC synth-2D (2 of 2)]
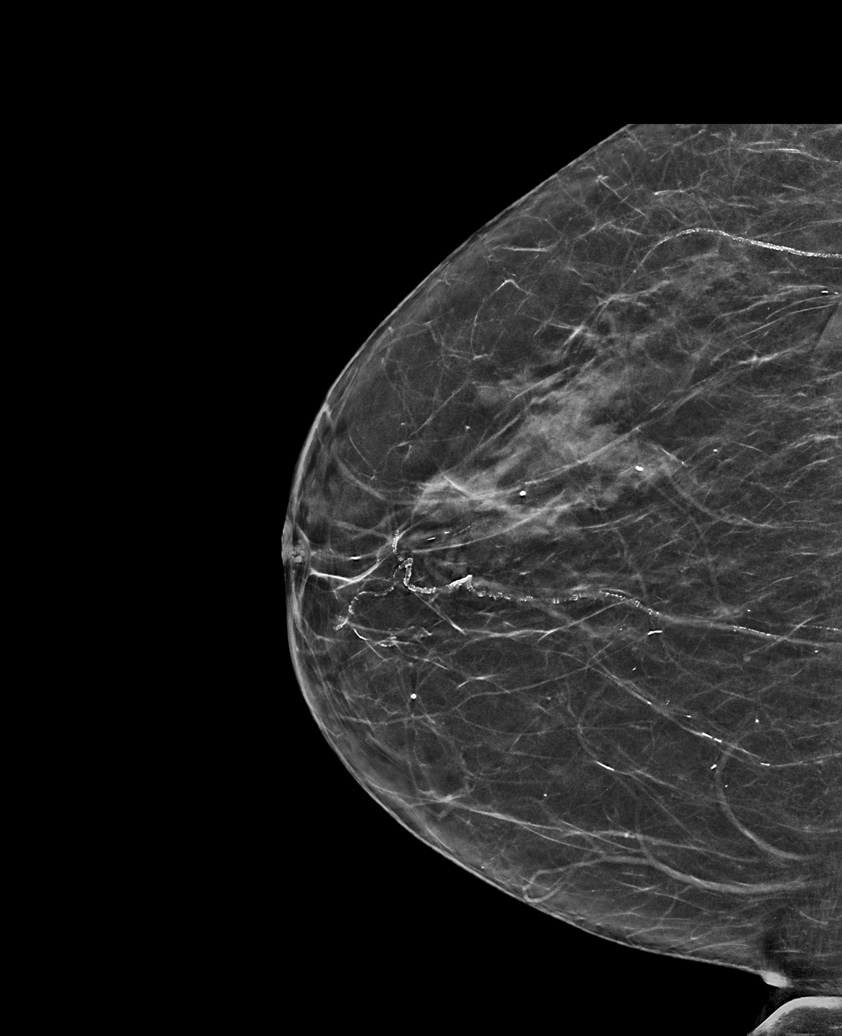

[L MLO tomo · tomo slice 33/65.0]
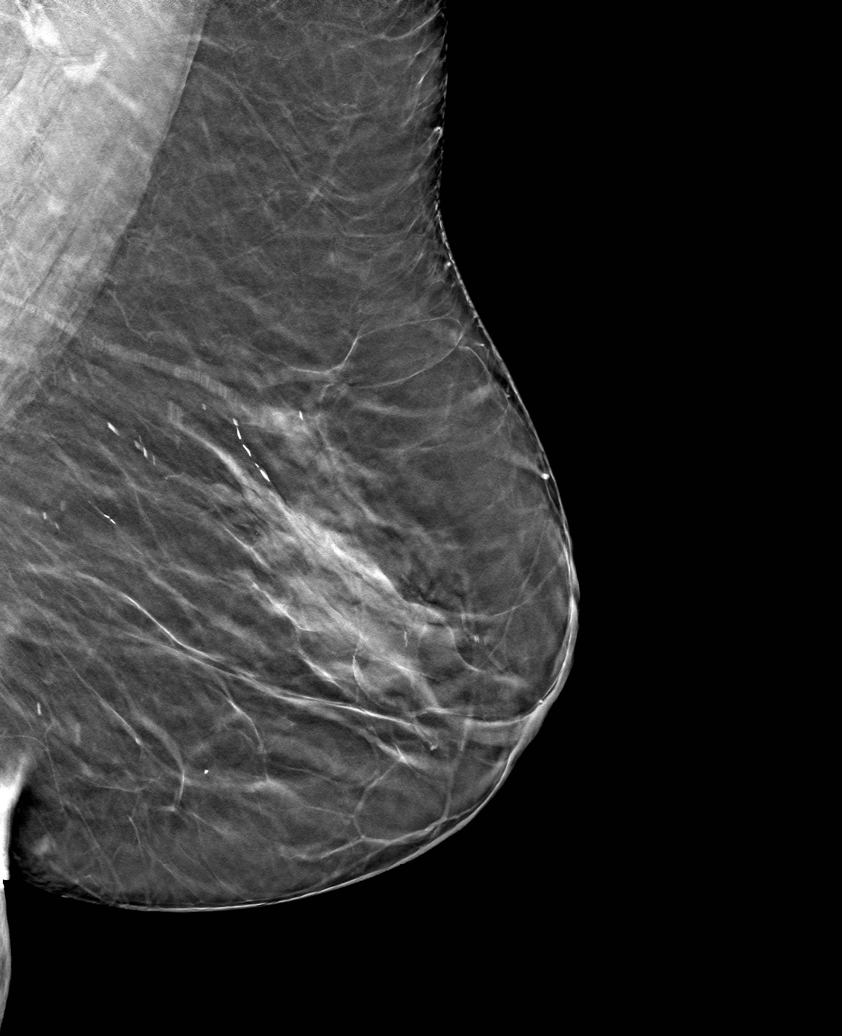

[6 of 30 positions shown; findings below may reference images not displayed]

ACR Breast Density Category b: There are scattered areas of
fibroglandular density.
FINDINGS: Stable mammographic appearance of the breasts with no interval
findings suspicious for malignancy. No recurrent mass is visualized
at the location of the previously diagnosed DCIS in the upper-outer
quadrant of the left breast, marked with a coil shaped biopsy marker
clip.

Mammographic images were processed with CAD.
IMPRESSION: No evidence of malignancy.

RECOMMENDATION:
Bilateral diagnostic mammogram in 1 year.

I have discussed the findings and recommendations with the patient.
If applicable, a reminder letter will be sent to the patient
regarding the next appointment.

BI-RADS CATEGORY  6: Known biopsy-proven malignancy.

## 2019-11-12 DIAGNOSIS — I5030 Unspecified diastolic (congestive) heart failure: Secondary | ICD-10-CM | POA: Diagnosis not present

## 2019-11-12 DIAGNOSIS — N183 Chronic kidney disease, stage 3 unspecified: Secondary | ICD-10-CM | POA: Diagnosis not present

## 2019-11-13 ENCOUNTER — Encounter: Payer: Medicare Other | Admitting: Physical Medicine & Rehabilitation

## 2019-11-13 DIAGNOSIS — N183 Chronic kidney disease, stage 3 unspecified: Secondary | ICD-10-CM | POA: Diagnosis not present

## 2019-11-13 DIAGNOSIS — I5030 Unspecified diastolic (congestive) heart failure: Secondary | ICD-10-CM | POA: Diagnosis not present

## 2019-11-15 ENCOUNTER — Telehealth: Payer: Self-pay | Admitting: Hematology and Oncology

## 2019-11-15 NOTE — Telephone Encounter (Signed)
R/s 9/7 appt per 9/3 sch msg. Called and spoke with patient. Confirmed appt

## 2019-11-19 ENCOUNTER — Inpatient Hospital Stay: Payer: Medicare Other | Admitting: Hematology and Oncology

## 2019-11-24 ENCOUNTER — Other Ambulatory Visit: Payer: Self-pay | Admitting: Physician Assistant

## 2019-12-02 ENCOUNTER — Other Ambulatory Visit: Payer: Self-pay | Admitting: *Deleted

## 2019-12-02 MED ORDER — PANTOPRAZOLE SODIUM 40 MG PO TBEC: 40.0000 mg | DELAYED_RELEASE_TABLET | Freq: Every day | ORAL | 3 refills | Status: DC

## 2019-12-02 NOTE — Progress Notes (Signed)
Patient Care Team: Marco Collie, MD as PCP - General (Family Medicine)  DIAGNOSIS:    ICD-10-CM   1. Ductal carcinoma in situ (DCIS) of left breast  D05.12     SUMMARY OF ONCOLOGIC HISTORY: Oncology History  Ductal carcinoma in situ (DCIS) of left breast  10/23/2017 Initial Diagnosis   Screening detected 5 mm indeterminate mass left breast upper outer quadrant biopsy revealed intermediate grade DCIS ER 100%, PR 100%, Tis N0 stage 0   11/08/2017 -  Anti-estrogen oral therapy   Tamoxifen as part of active surveillance because the patient does not want to undergo surgery at this time.     CHIEF COMPLIANT: Follow-up of left breast DCIS on tamoxifen  INTERVAL HISTORY: Elizabeth [name] is a 74 y.o. with above-mentioned history of left breast DCIS who declined surgery and is currently on active surveillance with tamoxifen. Mammogram on 11/11/19 showed no recurrent mass at the site of the previously diagnosed DCIS and no evidence of other malignancy. She presents to the clinic today for annual follow-up.  She is crying profusely today because she lost her husband a few weeks ago from a sudden cardiac arrest.   ALLERGIES:  is allergic to penicillins, atorvastatin, codeine, and prednisone.  MEDICATIONS:  Current Outpatient Medications  Medication Sig Dispense Refill  . amLODipine (NORVASC) 5 MG tablet TAKE 1 TABLET(5 MG) BY MOUTH DAILY 90 tablet 2  . aspirin EC 81 MG tablet Take 1 tablet (81 mg total) by mouth daily. 90 tablet 3  . carvedilol (COREG) 6.25 MG tablet Take 1 tablet (6.25 mg total) by mouth 2 (two) times daily with a meal. 180 tablet 3  . clopidogrel (PLAVIX) 75 MG tablet TAKE 1 TABLET(75 MG) BY MOUTH DAILY WITH BREAKFAST 90 tablet 1  . furosemide (LASIX) 20 MG tablet Take 20 mg by mouth every other day.    . ibuprofen (ADVIL) 800 MG tablet Take 800 mg by mouth every 8 (eight) hours as needed.    Marland Kitchen lisinopril (ZESTRIL) 10 MG tablet Take 1 tablet (10 mg total) by mouth  daily. 90 tablet 3  . pantoprazole (PROTONIX) 40 MG tablet Take 1 tablet (40 mg total) by mouth daily. 90 tablet 3  . pravastatin (PRAVACHOL) 20 MG tablet Take 1 tablet (20 mg total) by mouth every evening. 90 tablet 3  . tamoxifen (NOLVADEX) 20 MG tablet TAKE 1 TABLET(20 MG) BY MOUTH DAILY 90 tablet 3   No current facility-administered medications for this visit.    PHYSICAL EXAMINATION: ECOG PERFORMANCE STATUS: 1 - Symptomatic but completely ambulatory  There were no vitals filed for this visit. There were no vitals filed for this visit.  BREAST: No palpable masses or nodules in either right or left breasts. No palpable axillary supraclavicular or infraclavicular adenopathy no breast tenderness or nipple discharge. (exam performed in the presence of a chaperone)  LABORATORY DATA:  I have reviewed the data as listed CMP Latest Ref Rng & Units 07/29/2019 05/29/2019 05/28/2019  Glucose 65 - 99 mg/dL 86 137(H) 108(H)  BUN 8 - 27 mg/dL 24 15 17   Creatinine 0.57 - 1.00 mg/dL 1.04(H) 0.82 0.80  Sodium 134 - 144 mmol/L 142 140 144  Potassium 3.5 - 5.2 mmol/L 4.0 3.5 3.6  Chloride 96 - 106 mmol/L 107(H) 109 110  CO2 20 - 29 mmol/L 22 20(L) -  Calcium 8.7 - 10.3 mg/dL 8.7 8.2(L) -  Total Protein 6.5 - 8.1 g/dL - - -  Total Bilirubin 0.3 - 1.2 mg/dL - - -  Alkaline Phos 38 - 126 U/L - - -  AST 15 - 41 U/L - - -  ALT 0 - 44 U/L - - -    Lab Results  Component Value Date   WBC 6.0 05/29/2019   HGB 10.2 (L) 05/29/2019   HCT 30.8 (L) 05/29/2019   MCV 93.1 05/29/2019   PLT 122 (L) 05/29/2019    ASSESSMENT & PLAN:  Ductal carcinoma in situ (DCIS) of left breast 10/23/2017:Screening detected 5 mm indeterminate mass left breast upper outer quadrant biopsy revealed intermediate grade DCIS ER 100%, PR 100%, Tis N0 stage 0  Treatment plan: Active surveillance with tamoxifen and mammograms every 6 months. Patient decided not to undergo surgery. Current treatment: Tamoxifen 20 mg daily  started 10/31/2017  Tamoxifen toxicities:Denies any hot flashes or myalgias.  Breast cancer surveillance: 1.Mammogram 11/11/2019: No evidence of malignancy breast density category B  2.Breast exam 12/03/2019: Benign  Her husband passed away a few weeks ago and she is profoundly in grief. She will come back in 6 months for a mammogram on the left breast.  Return to clinic in 1 year forfollow-up after another bilateral mammograms.    No orders of the defined types were placed in this encounter.  The patient has a good understanding of the overall plan. she agrees with it. she will call with any problems that may develop before the next visit here.  Total time spent: 20 mins including face to face time and time spent for planning, charting and coordination of care  Nicholas Lose, MD 12/03/2019  I, Cloyde Reams Dorshimer, am acting as scribe for Dr. Nicholas Lose.  I have reviewed the above documentation for accuracy and completeness, and I agree with the above.

## 2019-12-03 ENCOUNTER — Telehealth: Payer: Self-pay | Admitting: Hematology and Oncology

## 2019-12-03 ENCOUNTER — Other Ambulatory Visit: Payer: Self-pay

## 2019-12-03 ENCOUNTER — Inpatient Hospital Stay: Payer: Medicare Other | Attending: Hematology and Oncology | Admitting: Hematology and Oncology

## 2019-12-03 DIAGNOSIS — Z170 Estrogen receptor positive status [ER+]: Secondary | ICD-10-CM | POA: Insufficient documentation

## 2019-12-03 DIAGNOSIS — D0512 Intraductal carcinoma in situ of left breast: Secondary | ICD-10-CM | POA: Diagnosis not present

## 2019-12-03 DIAGNOSIS — Z7982 Long term (current) use of aspirin: Secondary | ICD-10-CM | POA: Insufficient documentation

## 2019-12-03 DIAGNOSIS — Z791 Long term (current) use of non-steroidal anti-inflammatories (NSAID): Secondary | ICD-10-CM | POA: Diagnosis not present

## 2019-12-03 DIAGNOSIS — Z7901 Long term (current) use of anticoagulants: Secondary | ICD-10-CM | POA: Diagnosis not present

## 2019-12-03 DIAGNOSIS — Z79810 Long term (current) use of selective estrogen receptor modulators (SERMs): Secondary | ICD-10-CM | POA: Insufficient documentation

## 2019-12-03 DIAGNOSIS — Z79899 Other long term (current) drug therapy: Secondary | ICD-10-CM | POA: Diagnosis not present

## 2019-12-03 MED ORDER — TAMOXIFEN CITRATE 20 MG PO TABS: 20.0000 mg | ORAL_TABLET | Freq: Every day | ORAL | 3 refills | Status: DC

## 2019-12-03 NOTE — Telephone Encounter (Signed)
Scheduled appts per 9/21 los. Gave pt a print out of AVS.

## 2019-12-03 NOTE — Assessment & Plan Note (Signed)
10/23/2017:Screening detected 5 mm indeterminate mass left breast upper outer quadrant biopsy revealed intermediate grade DCIS ER 100%, PR 100%, Tis N0 stage 0  Treatment plan: Active surveillance with tamoxifen and mammograms every 6 months. Patient decided not to undergo surgery. Current treatment: Tamoxifen 20 mg daily started 10/31/2017  Tamoxifen toxicities:Denies any hot flashes or myalgias. Obesity: Patient blames tamoxifen for inability to lose weight  Breast cancer surveillance: 1.Mammogram 11/11/2019: No evidence of malignancy breast density category B  2.Breast exam 12/03/2019: Benign  Return to clinic in 1 year forfollow-up

## 2019-12-11 ENCOUNTER — Encounter: Payer: Self-pay | Admitting: Physical Medicine & Rehabilitation

## 2019-12-11 ENCOUNTER — Other Ambulatory Visit: Payer: Self-pay

## 2019-12-11 ENCOUNTER — Encounter: Payer: Medicare Other | Attending: Physical Medicine & Rehabilitation | Admitting: Physical Medicine & Rehabilitation

## 2019-12-11 DIAGNOSIS — M25561 Pain in right knee: Secondary | ICD-10-CM | POA: Insufficient documentation

## 2019-12-11 DIAGNOSIS — M170 Bilateral primary osteoarthritis of knee: Secondary | ICD-10-CM | POA: Insufficient documentation

## 2019-12-11 DIAGNOSIS — Z952 Presence of prosthetic heart valve: Secondary | ICD-10-CM | POA: Insufficient documentation

## 2019-12-11 DIAGNOSIS — M25562 Pain in left knee: Secondary | ICD-10-CM | POA: Insufficient documentation

## 2019-12-11 DIAGNOSIS — G5751 Tarsal tunnel syndrome, right lower limb: Secondary | ICD-10-CM | POA: Diagnosis not present

## 2019-12-11 DIAGNOSIS — G8929 Other chronic pain: Secondary | ICD-10-CM | POA: Diagnosis not present

## 2019-12-11 HISTORY — DX: Bilateral primary osteoarthritis of knee: M17.0

## 2019-12-11 MED ORDER — DICLOFENAC SODIUM 75 MG PO TBEC: 75.0000 mg | DELAYED_RELEASE_TABLET | Freq: Two times a day (BID) | ORAL | 3 refills | Status: DC | PRN

## 2019-12-11 NOTE — Progress Notes (Addendum)
PROCEDURE NOTE  DIAGNOSIS: primary osteoarthritis bilateral knees  INTERVENTION:  Bilateral monovisc one injections     After informed consent and preparation of the skin with betadine and isopropyl alcohol, I injected  67ml monovisc into the bilateral knees via anterolateral approach. Additionally, aspiration was performed prior to injection. The patient tolerated well, and no complications were encountered. Afterward the area was cleaned and dressed. Post- injection instructions were provided.   Additionally, will stop ibuprofen and begin trial of PRN diclofenac. Also provided a list of supplements she may use for OA.     Meredith Staggers, MD, [facility] 12/11/2019

## 2019-12-11 NOTE — Patient Instructions (Addendum)
PLEASE FEEL FREE TO CALL OUR OFFICE WITH ANY PROBLEMS OR QUESTIONS ([phone])    SUPPLEMENTS USEFUL FOR OSTEOARTHRITIS: OMEGA 3 FATTY ACIDS, TURMERIC, GINGER, TART CHERRY EXTRACT, CELERY SEED, GLUCOSAMINE WITH CHONDROITIN

## 2019-12-12 DIAGNOSIS — Z23 Encounter for immunization: Secondary | ICD-10-CM | POA: Diagnosis not present

## 2019-12-13 DIAGNOSIS — N183 Chronic kidney disease, stage 3 unspecified: Secondary | ICD-10-CM | POA: Diagnosis not present

## 2019-12-13 DIAGNOSIS — I5030 Unspecified diastolic (congestive) heart failure: Secondary | ICD-10-CM | POA: Diagnosis not present

## 2019-12-17 ENCOUNTER — Telehealth: Payer: Self-pay | Admitting: Cardiology

## 2019-12-17 NOTE — Telephone Encounter (Signed)
Pt c/o medication issue:  1. Name of Medication: pravastatin (PRAVACHOL) 20 MG tablet  2. How are you currently taking this medication (dosage and times per day)? Stopped taking  3. Are you having a reaction (difficulty breathing--STAT)? no  4. What is your medication issue? Patient states she has been having muscle pain from the medication and had to stop taking it.

## 2019-12-19 NOTE — Telephone Encounter (Signed)
Ok to hold the pravastatin until she see Dr. Bettina Gavia.

## 2019-12-19 NOTE — Telephone Encounter (Signed)
Follow Up:    Pt is calling back, says she have not heard anything, She says she needs to know something asap please.

## 2019-12-20 NOTE — Telephone Encounter (Signed)
Spoke with patient regarding Dr. Terrial Rhodes recommendations. She verbalizes understanding and thanks me for the call back.

## 2019-12-31 DIAGNOSIS — E782 Mixed hyperlipidemia: Secondary | ICD-10-CM | POA: Diagnosis not present

## 2019-12-31 DIAGNOSIS — M79605 Pain in left leg: Secondary | ICD-10-CM | POA: Diagnosis not present

## 2019-12-31 DIAGNOSIS — M79604 Pain in right leg: Secondary | ICD-10-CM | POA: Diagnosis not present

## 2020-01-08 DIAGNOSIS — M79605 Pain in left leg: Secondary | ICD-10-CM | POA: Diagnosis not present

## 2020-01-08 DIAGNOSIS — M79604 Pain in right leg: Secondary | ICD-10-CM | POA: Diagnosis not present

## 2020-01-08 DIAGNOSIS — I700 Atherosclerosis of aorta: Secondary | ICD-10-CM | POA: Diagnosis not present

## 2020-01-08 DIAGNOSIS — R6889 Other general symptoms and signs: Secondary | ICD-10-CM | POA: Diagnosis not present

## 2020-01-10 DIAGNOSIS — M25572 Pain in left ankle and joints of left foot: Secondary | ICD-10-CM | POA: Diagnosis not present

## 2020-01-10 DIAGNOSIS — R262 Difficulty in walking, not elsewhere classified: Secondary | ICD-10-CM | POA: Diagnosis not present

## 2020-01-10 DIAGNOSIS — M25571 Pain in right ankle and joints of right foot: Secondary | ICD-10-CM | POA: Diagnosis not present

## 2020-01-10 DIAGNOSIS — M6281 Muscle weakness (generalized): Secondary | ICD-10-CM | POA: Diagnosis not present

## 2020-01-13 DIAGNOSIS — E782 Mixed hyperlipidemia: Secondary | ICD-10-CM | POA: Diagnosis not present

## 2020-01-13 DIAGNOSIS — I10 Essential (primary) hypertension: Secondary | ICD-10-CM | POA: Diagnosis not present

## 2020-01-13 DIAGNOSIS — N183 Chronic kidney disease, stage 3 unspecified: Secondary | ICD-10-CM | POA: Diagnosis not present

## 2020-01-13 DIAGNOSIS — I700 Atherosclerosis of aorta: Secondary | ICD-10-CM | POA: Diagnosis not present

## 2020-01-16 DIAGNOSIS — M25572 Pain in left ankle and joints of left foot: Secondary | ICD-10-CM | POA: Diagnosis not present

## 2020-01-16 DIAGNOSIS — R262 Difficulty in walking, not elsewhere classified: Secondary | ICD-10-CM | POA: Diagnosis not present

## 2020-01-16 DIAGNOSIS — M6281 Muscle weakness (generalized): Secondary | ICD-10-CM | POA: Diagnosis not present

## 2020-01-16 DIAGNOSIS — M25571 Pain in right ankle and joints of right foot: Secondary | ICD-10-CM | POA: Diagnosis not present

## 2020-01-21 DIAGNOSIS — M6281 Muscle weakness (generalized): Secondary | ICD-10-CM | POA: Diagnosis not present

## 2020-01-21 DIAGNOSIS — M25571 Pain in right ankle and joints of right foot: Secondary | ICD-10-CM | POA: Diagnosis not present

## 2020-01-21 DIAGNOSIS — M25572 Pain in left ankle and joints of left foot: Secondary | ICD-10-CM | POA: Diagnosis not present

## 2020-01-21 DIAGNOSIS — R262 Difficulty in walking, not elsewhere classified: Secondary | ICD-10-CM | POA: Diagnosis not present

## 2020-01-23 DIAGNOSIS — R262 Difficulty in walking, not elsewhere classified: Secondary | ICD-10-CM | POA: Diagnosis not present

## 2020-01-23 DIAGNOSIS — M6281 Muscle weakness (generalized): Secondary | ICD-10-CM | POA: Diagnosis not present

## 2020-01-23 DIAGNOSIS — M25571 Pain in right ankle and joints of right foot: Secondary | ICD-10-CM | POA: Diagnosis not present

## 2020-01-23 DIAGNOSIS — M25572 Pain in left ankle and joints of left foot: Secondary | ICD-10-CM | POA: Diagnosis not present

## 2020-01-28 DIAGNOSIS — R262 Difficulty in walking, not elsewhere classified: Secondary | ICD-10-CM | POA: Diagnosis not present

## 2020-01-28 DIAGNOSIS — M6281 Muscle weakness (generalized): Secondary | ICD-10-CM | POA: Diagnosis not present

## 2020-01-28 DIAGNOSIS — M25572 Pain in left ankle and joints of left foot: Secondary | ICD-10-CM | POA: Diagnosis not present

## 2020-01-28 DIAGNOSIS — M25571 Pain in right ankle and joints of right foot: Secondary | ICD-10-CM | POA: Diagnosis not present

## 2020-01-29 DIAGNOSIS — M2170 Unequal limb length (acquired), unspecified site: Secondary | ICD-10-CM | POA: Diagnosis not present

## 2020-01-29 DIAGNOSIS — M48061 Spinal stenosis, lumbar region without neurogenic claudication: Secondary | ICD-10-CM | POA: Diagnosis not present

## 2020-01-29 DIAGNOSIS — R6889 Other general symptoms and signs: Secondary | ICD-10-CM | POA: Diagnosis not present

## 2020-01-29 DIAGNOSIS — M5410 Radiculopathy, site unspecified: Secondary | ICD-10-CM | POA: Diagnosis not present

## 2020-02-12 ENCOUNTER — Encounter: Payer: Medicare Other | Admitting: Physical Medicine & Rehabilitation

## 2020-02-12 DIAGNOSIS — M48061 Spinal stenosis, lumbar region without neurogenic claudication: Secondary | ICD-10-CM | POA: Diagnosis not present

## 2020-02-12 DIAGNOSIS — M5410 Radiculopathy, site unspecified: Secondary | ICD-10-CM | POA: Diagnosis not present

## 2020-02-12 DIAGNOSIS — E782 Mixed hyperlipidemia: Secondary | ICD-10-CM | POA: Diagnosis not present

## 2020-02-12 DIAGNOSIS — I700 Atherosclerosis of aorta: Secondary | ICD-10-CM | POA: Diagnosis not present

## 2020-02-13 DIAGNOSIS — R262 Difficulty in walking, not elsewhere classified: Secondary | ICD-10-CM | POA: Diagnosis not present

## 2020-02-13 DIAGNOSIS — M6281 Muscle weakness (generalized): Secondary | ICD-10-CM | POA: Diagnosis not present

## 2020-02-13 DIAGNOSIS — M25572 Pain in left ankle and joints of left foot: Secondary | ICD-10-CM | POA: Diagnosis not present

## 2020-02-13 DIAGNOSIS — M25571 Pain in right ankle and joints of right foot: Secondary | ICD-10-CM | POA: Diagnosis not present

## 2020-02-18 DIAGNOSIS — M6281 Muscle weakness (generalized): Secondary | ICD-10-CM | POA: Diagnosis not present

## 2020-02-18 DIAGNOSIS — M25572 Pain in left ankle and joints of left foot: Secondary | ICD-10-CM | POA: Diagnosis not present

## 2020-02-18 DIAGNOSIS — M25571 Pain in right ankle and joints of right foot: Secondary | ICD-10-CM | POA: Diagnosis not present

## 2020-02-18 DIAGNOSIS — R262 Difficulty in walking, not elsewhere classified: Secondary | ICD-10-CM | POA: Diagnosis not present

## 2020-02-25 DIAGNOSIS — R6889 Other general symptoms and signs: Secondary | ICD-10-CM | POA: Diagnosis not present

## 2020-02-25 DIAGNOSIS — M79605 Pain in left leg: Secondary | ICD-10-CM | POA: Diagnosis not present

## 2020-02-25 DIAGNOSIS — M79604 Pain in right leg: Secondary | ICD-10-CM | POA: Diagnosis not present

## 2020-03-05 DIAGNOSIS — M25572 Pain in left ankle and joints of left foot: Secondary | ICD-10-CM | POA: Diagnosis not present

## 2020-03-05 DIAGNOSIS — M25571 Pain in right ankle and joints of right foot: Secondary | ICD-10-CM | POA: Diagnosis not present

## 2020-03-05 DIAGNOSIS — R262 Difficulty in walking, not elsewhere classified: Secondary | ICD-10-CM | POA: Diagnosis not present

## 2020-03-05 DIAGNOSIS — M6281 Muscle weakness (generalized): Secondary | ICD-10-CM | POA: Diagnosis not present

## 2020-03-10 DIAGNOSIS — M79605 Pain in left leg: Secondary | ICD-10-CM | POA: Diagnosis not present

## 2020-03-10 DIAGNOSIS — M79604 Pain in right leg: Secondary | ICD-10-CM | POA: Diagnosis not present

## 2020-03-10 DIAGNOSIS — M2170 Unequal limb length (acquired), unspecified site: Secondary | ICD-10-CM | POA: Diagnosis not present

## 2020-03-14 DIAGNOSIS — E782 Mixed hyperlipidemia: Secondary | ICD-10-CM | POA: Diagnosis not present

## 2020-03-14 DIAGNOSIS — I700 Atherosclerosis of aorta: Secondary | ICD-10-CM | POA: Diagnosis not present

## 2020-03-14 DIAGNOSIS — M48061 Spinal stenosis, lumbar region without neurogenic claudication: Secondary | ICD-10-CM | POA: Diagnosis not present

## 2020-03-14 DIAGNOSIS — I10 Essential (primary) hypertension: Secondary | ICD-10-CM | POA: Diagnosis not present

## 2020-03-23 DIAGNOSIS — R600 Localized edema: Secondary | ICD-10-CM | POA: Diagnosis not present

## 2020-03-23 DIAGNOSIS — M79605 Pain in left leg: Secondary | ICD-10-CM | POA: Diagnosis not present

## 2020-03-24 DIAGNOSIS — R208 Other disturbances of skin sensation: Secondary | ICD-10-CM | POA: Diagnosis not present

## 2020-03-24 DIAGNOSIS — M19072 Primary osteoarthritis, left ankle and foot: Secondary | ICD-10-CM | POA: Diagnosis not present

## 2020-03-24 DIAGNOSIS — M7989 Other specified soft tissue disorders: Secondary | ICD-10-CM | POA: Diagnosis not present

## 2020-03-24 DIAGNOSIS — R600 Localized edema: Secondary | ICD-10-CM | POA: Diagnosis not present

## 2020-04-01 ENCOUNTER — Encounter: Payer: Medicare Other | Admitting: Physical Medicine & Rehabilitation

## 2020-04-07 DIAGNOSIS — R262 Difficulty in walking, not elsewhere classified: Secondary | ICD-10-CM | POA: Diagnosis not present

## 2020-04-07 DIAGNOSIS — M25572 Pain in left ankle and joints of left foot: Secondary | ICD-10-CM | POA: Diagnosis not present

## 2020-04-07 DIAGNOSIS — M25571 Pain in right ankle and joints of right foot: Secondary | ICD-10-CM | POA: Diagnosis not present

## 2020-04-07 DIAGNOSIS — M6281 Muscle weakness (generalized): Secondary | ICD-10-CM | POA: Diagnosis not present

## 2020-04-09 DIAGNOSIS — M25571 Pain in right ankle and joints of right foot: Secondary | ICD-10-CM | POA: Diagnosis not present

## 2020-04-09 DIAGNOSIS — M25572 Pain in left ankle and joints of left foot: Secondary | ICD-10-CM | POA: Diagnosis not present

## 2020-04-09 DIAGNOSIS — M6281 Muscle weakness (generalized): Secondary | ICD-10-CM | POA: Diagnosis not present

## 2020-04-09 DIAGNOSIS — R262 Difficulty in walking, not elsewhere classified: Secondary | ICD-10-CM | POA: Diagnosis not present

## 2020-04-13 DIAGNOSIS — N182 Chronic kidney disease, stage 2 (mild): Secondary | ICD-10-CM | POA: Insufficient documentation

## 2020-04-13 DIAGNOSIS — I129 Hypertensive chronic kidney disease with stage 1 through stage 4 chronic kidney disease, or unspecified chronic kidney disease: Secondary | ICD-10-CM | POA: Insufficient documentation

## 2020-04-13 DIAGNOSIS — Z87442 Personal history of urinary calculi: Secondary | ICD-10-CM | POA: Insufficient documentation

## 2020-04-13 DIAGNOSIS — F419 Anxiety disorder, unspecified: Secondary | ICD-10-CM | POA: Insufficient documentation

## 2020-04-13 DIAGNOSIS — K219 Gastro-esophageal reflux disease without esophagitis: Secondary | ICD-10-CM | POA: Insufficient documentation

## 2020-04-13 DIAGNOSIS — E790 Hyperuricemia without signs of inflammatory arthritis and tophaceous disease: Secondary | ICD-10-CM | POA: Insufficient documentation

## 2020-04-13 DIAGNOSIS — C50919 Malignant neoplasm of unspecified site of unspecified female breast: Secondary | ICD-10-CM | POA: Insufficient documentation

## 2020-04-13 DIAGNOSIS — M1990 Unspecified osteoarthritis, unspecified site: Secondary | ICD-10-CM | POA: Insufficient documentation

## 2020-04-13 DIAGNOSIS — F40240 Claustrophobia: Secondary | ICD-10-CM | POA: Insufficient documentation

## 2020-04-13 DIAGNOSIS — M48061 Spinal stenosis, lumbar region without neurogenic claudication: Secondary | ICD-10-CM | POA: Insufficient documentation

## 2020-04-13 DIAGNOSIS — E559 Vitamin D deficiency, unspecified: Secondary | ICD-10-CM | POA: Insufficient documentation

## 2020-04-14 DIAGNOSIS — I10 Essential (primary) hypertension: Secondary | ICD-10-CM | POA: Diagnosis not present

## 2020-04-14 DIAGNOSIS — M6281 Muscle weakness (generalized): Secondary | ICD-10-CM | POA: Diagnosis not present

## 2020-04-14 DIAGNOSIS — M25571 Pain in right ankle and joints of right foot: Secondary | ICD-10-CM | POA: Diagnosis not present

## 2020-04-14 DIAGNOSIS — E782 Mixed hyperlipidemia: Secondary | ICD-10-CM | POA: Diagnosis not present

## 2020-04-14 DIAGNOSIS — M25572 Pain in left ankle and joints of left foot: Secondary | ICD-10-CM | POA: Diagnosis not present

## 2020-04-14 DIAGNOSIS — M48061 Spinal stenosis, lumbar region without neurogenic claudication: Secondary | ICD-10-CM | POA: Diagnosis not present

## 2020-04-14 DIAGNOSIS — I700 Atherosclerosis of aorta: Secondary | ICD-10-CM | POA: Diagnosis not present

## 2020-04-14 DIAGNOSIS — R262 Difficulty in walking, not elsewhere classified: Secondary | ICD-10-CM | POA: Diagnosis not present

## 2020-04-27 ENCOUNTER — Other Ambulatory Visit: Payer: Self-pay

## 2020-04-27 DIAGNOSIS — R6889 Other general symptoms and signs: Secondary | ICD-10-CM

## 2020-04-27 NOTE — Progress Notes (Unsigned)
Cardiology Office Note:    Date:  04/28/2020   ID:  Elizabeth, [name] 04-22-1945, MRN [id number]  PCP:  Marco Collie, MD  Cardiologist:  Shirlee More, MD    Referring MD: Marco Collie, MD    ASSESSMENT:    1. S/P TAVR (transcatheter aortic valve replacement)   2. Chronic diastolic heart failure ([place])   3. Hypertension, renal disease, stage 1-4 or unspecified chronic kidney disease   4. Mixed hyperlipidemia   5. Mild CAD   6. Palpitations    PLAN:    In order of problems listed above:  1. In general she is made a good recovery from TAVR heart failure is compensated she continues to take low-dose aspirin and will arrange for follow-up echocardiogram in my office in March. She'll continue her loop diuretic antihypertensives beta-blocker calcium channel blocker ACE inhibitor recheck renal function and potassium as well as lipid profile. She'll likely need to restart a statin. 2. I'm concerned with the alert on her blood pressure device she may have atrial arrhythmia we'll apply a 7-day ZIO monitor.   Next appointment: 6 months   Medication Adjustments/Labs and Tests Ordered: Current medicines are reviewed at length with the patient today.  Concerns regarding medicines are outlined above.  No orders of the defined types were placed in this encounter.  No orders of the defined types were placed in this encounter.   No chief complaint on file.   History of Present Illness:    Elizabeth [name] is a 75 y.o. female with a hx of neck stenosis with TAVR 05/28/2019, hypertensive heart disease with heart failure and stage II CKD and hyperlipidemia last seen 10/29/2019. Compliance with diet, lifestyle and medications: Yes  She is recovered from her TAVR. No edema shortness of breath chest pain palpitation or syncope. Home blood pressure runs are 673 -419 systolic however she gets a warning of irregular pulse on her device time to time. She has a new diagnosis of neuropathy  unsteady gait was advised gabapentin I told her in her case it is a poor choice because of the intense sodium retention. She is due for labs and I'll draw CMP proBNP and a lipid profile today.  She had a left lower extremity venous duplex done [facility] 03/23/2020 which was normal no findings of deep vein thrombosis Past Medical History:  Diagnosis Date  . Anxiety    extreme  . Benign hypertension with CKD (chronic kidney disease), stage II   . Breast cancer ([place])    Pre-cancerous cell in Left breast  . Chronic diastolic heart failure ([place]) 12/20/2018  . Chronic pain of both knees 09/12/2017   Elizabeth [name] is a 75 y.o. female with low back pain, bilateral knee pain, due to multilevel multifactorial degenerative changes in the lumbar spine, and, significant multiple joint osteoarthritis.   Last Assessment & Plan:  Today I will treat with bilateral knee injections.   Elizabeth [name] will return to the clinic in 6 months.  I will asses the efficacy of this treatment and make adjustments to her   . CKD (chronic kidney disease), stage III ([place])   . Claustrophobia   . Ductal carcinoma in situ (DCIS) of left breast 11/08/2017  . GERD (gastroesophageal reflux disease)   . History of kidney stones   . Hypertension 09/25/2018  . Hyperuricemia   . Insomnia 09/25/2018  . Lumbar radiculopathy 11/28/2017   Elizabeth [name] is a 75 y.o. female with low back pain, lower  extremity pain, on the left, in the setting of previous laminotomy in April 2019.   Last Assessment & Plan:  Today I will treat with current OTC medications..   Elizabeth [name] will return to the clinic on an as needed basis.  I will asses the efficacy of this treatment and make adjustments to her treatment plan as necessary.  Last Assessment &  . Lumbar stenosis   . Osteoarthrosis   . S/P TAVR (transcatheter aortic valve replacement) 05/28/2019   s/p TAVR with a 29 mm Medtronic Evolut Pro + via the TF approach by Dr. Burt Knack and Dr. Cyndia Bent   . Severe  aortic stenosis   . Tachycardia 09/25/2018  . Vitamin D deficiency     Past Surgical History:  Procedure Laterality Date  . ABDOMINAL HYSTERECTOMY    . ABDOMINAL HYSTERECTOMY    . APPENDECTOMY    . BACK SURGERY    . CARDIAC CATHETERIZATION  03/11/2019  . CHOLECYSTECTOMY    . GALLBLADDER SURGERY    . INTRAOPERATIVE TRANSTHORACIC ECHOCARDIOGRAM  05/28/2019   Procedure: Intraoperative Transthoracic Echocardiogram;  Surgeon: Elizabeth Mocha, MD;  Location: [place];  Service: Open Heart Surgery;;  . IR RADIOLOGY PERIPHERAL GUIDED IV START  05/14/2019  . IR US GUIDE VASC ACCESS RIGHT  05/14/2019  . LUMBAR LAMINECTOMY/DECOMPRESSION MICRODISCECTOMY N/A 06/12/2017   Procedure: LAMINECTOMY AND FORAMINOTOMY LUMBAR THREE- LUMBAR FOUR, LUMBAR FOUR- LUMBAR FIVE;  Surgeon: Elizabeth Pies, MD;  Location: [place];  Service: Neurosurgery;  Laterality: N/A;  . RIGHT/LEFT HEART CATH AND CORONARY ANGIOGRAPHY N/A 03/11/2019   Procedure: RIGHT/LEFT HEART CATH AND CORONARY ANGIOGRAPHY;  Surgeon: Elizabeth Mocha, MD;  Location: [place] [facility];  Service: Cardiovascular;  Laterality: N/A;  . TONSILLECTOMY    . TRANSCATHETER AORTIC VALVE REPLACEMENT, TRANSFEMORAL N/A 05/28/2019   Procedure: TRANSCATHETER AORTIC VALVE REPLACEMENT, TRANSFEMORAL;  Surgeon: Elizabeth Mocha, MD;  Location: [place];  Service: Open Heart Surgery;  Laterality: N/A;    Current Medications: Current Meds  Medication Sig  . amLODipine (NORVASC) 5 MG tablet TAKE 1 TABLET(5 MG) BY MOUTH DAILY  . aspirin EC 81 MG tablet Take 1 tablet (81 mg total) by mouth daily.  . carvedilol (COREG) 6.25 MG tablet Take 1 tablet (6.25 mg total) by mouth 2 (two) times daily with a meal.  . furosemide (LASIX) 20 MG tablet Take 20 mg by mouth every other day.  . lisinopril (ZESTRIL) 10 MG tablet Take 1 tablet (10 mg total) by mouth daily.  . Omega-3 Fatty Acids (OMEGA 3 500 PO) Take 500 mg by mouth daily.  . pantoprazole (PROTONIX) 40 MG tablet Take 1 tablet (40 mg  total) by mouth daily.  . tamoxifen (NOLVADEX) 20 MG tablet Take 1 tablet (20 mg total) by mouth daily.  Marland Kitchen TART CHERRY PO Take 7,000 mg by mouth. Take 2 tablets daily  . tiZANidine (ZANAFLEX) 2 MG tablet Take 2 mg by mouth every 8 (eight) hours.     Allergies:   Penicillins, Atorvastatin, Codeine, and Prednisone   Social History   Socioeconomic History  . Marital status: Married    Spouse name: Not on file  . Number of children: 2  . Years of education: 26  . Highest education level: Not on file  Occupational History  . Occupation: child care  Tobacco Use  . Smoking status: Never Smoker  . Smokeless tobacco: Never Used  Vaping Use  . Vaping Use: Never used  Substance and Sexual Activity  . Alcohol use: No  . Drug  use: No  . Sexual activity: Not on file  Other Topics Concern  . Not on file  Social History Narrative   Lives with husband in a one story home.  Has 2 children.  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  Education: high school.     Social Determinants of Health   Financial Resource Strain: Not on file  Food Insecurity: Not on file  Transportation Needs: Not on file  Physical Activity: Not on file  Stress: Not on file  Social Connections: Not on file     Family History: The patient's family history includes Alzheimer's disease in her mother; Aneurysm in her father; Stomach cancer in her brother. ROS:   Please see the history of present illness.    All other systems reviewed and are negative.  EKGs/Labs/Other Studies Reviewed:    The following studies were reviewed today:   Recent Labs: 05/24/2019: ALT 13; B Natriuretic Peptide 121.3 05/29/2019: Hemoglobin 10.2; Magnesium 1.9; Platelets 122 07/29/2019: BUN 24; Creatinine, Ser 1.04; NT-Pro BNP 483; Potassium 4.0; Sodium 142  Recent Lipid Panel No results found for: CHOL, TRIG, HDL, CHOLHDL, VLDL, LDLCALC, LDLDIRECT  Physical Exam:    VS:  BP 140/76   Pulse 68   Ht 5\' 5"  (1.651 m)   Wt 233 lb (105.7 kg)    SpO2 99%   BMI 38.77 kg/m     Wt Readings from Last 3 Encounters:  04/28/20 233 lb (105.7 kg)  12/11/19 240 lb (108.9 kg)  12/03/19 239 lb 3.2 oz (108.5 kg)     GEN:  Well nourished, well developed in no acute distress HEENT: Normal NECK: No JVD; No carotid bruits LYMPHATICS: No lymphadenopathy CARDIAC: RRR, no murmurs, rubs, gallops RESPIRATORY:  Clear to auscultation without rales, wheezing or rhonchi  ABDOMEN: Soft, non-tender, non-distended MUSCULOSKELETAL:  No edema; No deformity  SKIN: Warm and dry NEUROLOGIC:  Alert and oriented x 3 PSYCHIATRIC:  Normal affect    Signed, Shirlee More, MD  04/28/2020 11:38 AM    [place]

## 2020-04-28 ENCOUNTER — Encounter: Payer: Self-pay | Admitting: Cardiology

## 2020-04-28 ENCOUNTER — Ambulatory Visit: Payer: Medicare Other | Admitting: Cardiology

## 2020-04-28 ENCOUNTER — Other Ambulatory Visit: Payer: Self-pay

## 2020-04-28 ENCOUNTER — Ambulatory Visit (INDEPENDENT_AMBULATORY_CARE_PROVIDER_SITE_OTHER): Payer: Medicare Other

## 2020-04-28 VITALS — BP 140/76 | HR 68 | Ht 65.0 in | Wt 233.0 lb

## 2020-04-28 DIAGNOSIS — I5032 Chronic diastolic (congestive) heart failure: Secondary | ICD-10-CM | POA: Diagnosis not present

## 2020-04-28 DIAGNOSIS — I2510 Atherosclerotic heart disease of native coronary artery without angina pectoris: Secondary | ICD-10-CM

## 2020-04-28 DIAGNOSIS — I129 Hypertensive chronic kidney disease with stage 1 through stage 4 chronic kidney disease, or unspecified chronic kidney disease: Secondary | ICD-10-CM

## 2020-04-28 DIAGNOSIS — R002 Palpitations: Secondary | ICD-10-CM

## 2020-04-28 DIAGNOSIS — Z952 Presence of prosthetic heart valve: Secondary | ICD-10-CM | POA: Diagnosis not present

## 2020-04-28 DIAGNOSIS — E782 Mixed hyperlipidemia: Secondary | ICD-10-CM | POA: Diagnosis not present

## 2020-04-28 NOTE — Patient Instructions (Signed)
Medication Instructions:  Your physician recommends that you continue on your current medications as directed. Please refer to the Current Medication list given to you today.  *If you need a refill on your cardiac medications before your next appointment, please call your pharmacy*   Lab Work: Your physician recommends that you return for lab work in: TODAY CMP, Lipids, ProBNP If you have labs (blood work) drawn today and your tests are completely normal, you will receive your results only by: Marland Kitchen MyChart Message (if you have MyChart) OR . A paper copy in the mail If you have any lab test that is abnormal or we need to change your treatment, we will call you to review the results.   Testing/Procedures: Your physician has requested that you have an echocardiogram. Echocardiography is a painless test that uses sound waves to create images of your heart. It provides your doctor with information about the size and shape of your heart and how well your heart's chambers and valves are working. This procedure takes approximately one hour. There are no restrictions for this procedure.  A zio monitor was ordered today. It will remain on for 7 days. You will then return monitor and event diary in provided box. It takes 1-2 weeks for report to be downloaded and returned to [place]. We will call you with the results. If monitor falls off or has orange flashing light, please call Zio for further instructions.      Follow-Up: At [facility], you and your health needs are our priority.  As part of our continuing mission to provide you with exceptional heart care, we have created designated Provider Care Teams.  These Care Teams include your primary Cardiologist (physician) and Advanced Practice Providers (APPs -  Physician Assistants and Nurse Practitioners) who all work together to provide you with the care you need, when you need it.  We recommend signing up for the patient portal called "MyChart".  Sign up  information is provided on this After Visit Summary.  MyChart is used to connect with patients for Virtual Visits (Telemedicine).  Patients are able to view lab/test results, encounter notes, upcoming appointments, etc.  Non-urgent messages can be sent to your provider as well.   To learn more about what you can do with MyChart, go to [email or web address].    Your next appointment:   6 month(s)  The format for your next appointment:   In Person  Provider:   Shirlee More, MD   Other Instructions

## 2020-04-29 ENCOUNTER — Telehealth: Payer: Self-pay

## 2020-04-29 LAB — LIPID PANEL
Chol/HDL Ratio: 3.6 ratio (ref 0.0–4.4)
Cholesterol, Total: 198 mg/dL (ref 100–199)
HDL: 55 mg/dL (ref >39)
LDL Chol Calc (NIH): 115 mg/dL — ABNORMAL HIGH (ref 0–99)
Triglycerides: 162 mg/dL — ABNORMAL HIGH (ref 0–149)
VLDL Cholesterol Cal: 28 mg/dL (ref 5–40)

## 2020-04-29 LAB — COMPREHENSIVE METABOLIC PANEL
ALT: 11 IU/L (ref 0–32)
AST: 19 IU/L (ref 0–40)
Albumin/Globulin Ratio: 1.6 (ref 1.2–2.2)
Albumin: 4.2 g/dL (ref 3.7–4.7)
Alkaline Phosphatase: 64 IU/L (ref 44–121)
BUN/Creatinine Ratio: 15 (ref 12–28)
BUN: 14 mg/dL (ref 8–27)
Bilirubin Total: 0.3 mg/dL (ref 0.0–1.2)
CO2: 18 mmol/L — ABNORMAL LOW (ref 20–29)
Calcium: 9.2 mg/dL (ref 8.7–10.3)
Chloride: 108 mmol/L — ABNORMAL HIGH (ref 96–106)
Creatinine, Ser: 0.96 mg/dL (ref 0.57–1.00)
GFR calc Af Amer: 67 mL/min/{1.73_m2} (ref >59)
GFR calc non Af Amer: 58 mL/min/{1.73_m2} — ABNORMAL LOW (ref >59)
Globulin, Total: 2.6 g/dL (ref 1.5–4.5)
Glucose: 90 mg/dL (ref 65–99)
Potassium: 4.7 mmol/L (ref 3.5–5.2)
Sodium: 144 mmol/L (ref 134–144)
Total Protein: 6.8 g/dL (ref 6.0–8.5)

## 2020-04-29 LAB — PRO B NATRIURETIC PEPTIDE: NT-Pro BNP: 431 pg/mL — ABNORMAL HIGH (ref 0–301)

## 2020-04-29 NOTE — Telephone Encounter (Signed)
-----   Message from Richardo Priest, MD sent at 04/29/2020 10:39 AM EST ----- Good/stable results no change in treatment

## 2020-04-29 NOTE — Telephone Encounter (Signed)
Patient notified of results and verbalized understanding.  

## 2020-05-12 ENCOUNTER — Other Ambulatory Visit: Payer: Self-pay

## 2020-05-12 ENCOUNTER — Ambulatory Visit (HOSPITAL_COMMUNITY)
Admission: RE | Admit: 2020-05-12 | Discharge: 2020-05-12 | Disposition: A | Discharge status: Home / Self Care | Payer: Medicare Other | Source: Ambulatory Visit | Attending: Vascular Surgery | Admitting: Vascular Surgery

## 2020-05-12 ENCOUNTER — Ambulatory Visit: Payer: Medicare Other | Admitting: Vascular Surgery

## 2020-05-12 ENCOUNTER — Encounter: Payer: Self-pay | Admitting: Vascular Surgery

## 2020-05-12 VITALS — BP 134/72 | HR 62 | Temp 97.3°F | Resp 20 | Ht 65.0 in | Wt 233.0 lb

## 2020-05-12 DIAGNOSIS — M79604 Pain in right leg: Secondary | ICD-10-CM

## 2020-05-12 DIAGNOSIS — I700 Atherosclerosis of aorta: Secondary | ICD-10-CM | POA: Diagnosis not present

## 2020-05-12 DIAGNOSIS — R6889 Other general symptoms and signs: Secondary | ICD-10-CM

## 2020-05-12 DIAGNOSIS — E782 Mixed hyperlipidemia: Secondary | ICD-10-CM | POA: Diagnosis not present

## 2020-05-12 DIAGNOSIS — M48061 Spinal stenosis, lumbar region without neurogenic claudication: Secondary | ICD-10-CM | POA: Diagnosis not present

## 2020-05-12 DIAGNOSIS — M79605 Pain in left leg: Secondary | ICD-10-CM

## 2020-05-12 DIAGNOSIS — I10 Essential (primary) hypertension: Secondary | ICD-10-CM | POA: Diagnosis not present

## 2020-05-12 NOTE — Progress Notes (Signed)
ASSESSMENT & PLAN:  75 y.o. female with bilateral calf pain. She has no evidence of hemodynamically significant peripheral arterial disease on exam or non-invasive testing. Her symptoms do not seem typical of peripheral neuropathy. She had symptomatic relief last year of similar pain with a genicular nerve block. I recommended she return to her primary care to discuss orthopedic evaluation or genicular nerve block / ablation.   CHIEF COMPLAINT:   Calf pain  HISTORY:  HISTORY OF PRESENT ILLNESS: Elizabeth [name] is a 75 y.o. very pleasant female referred to clinic for evaluation of bilateral calf discomfort.  She reports the pain occurs immediately on rising from a seated position.  Steady yourself for several minutes before the pain will resolve.  As she gets moving the pain improves.  She provides no symptoms classic for intermittent claudication, ischemic rest pain.  She has no ulceration about her feet.  She has been seeming a physical therapist who suggested she may have peripheral neuropathy.  She does not describe lack of sensation or burning sensation in a "stocking" pattern of her lower extremities.  Past Medical History:  Diagnosis Date  . Anxiety    extreme  . Benign hypertension with CKD (chronic kidney disease), stage II   . Breast cancer ([place])    Pre-cancerous cell in Left breast  . Chronic diastolic heart failure ([place]) 12/20/2018  . Chronic pain of both knees 09/12/2017   Elizabeth [name] is a 75 y.o. female with low back pain, bilateral knee pain, due to multilevel multifactorial degenerative changes in the lumbar spine, and, significant multiple joint osteoarthritis.   Last Assessment & Plan:  Today I will treat with bilateral knee injections.   Elizabeth [name] will return to the clinic in 6 months.  I will asses the efficacy of this treatment and make adjustments to her   . CKD (chronic kidney disease), stage III ([place])   . Claustrophobia   . Ductal carcinoma in situ (DCIS) of  left breast 11/08/2017  . GERD (gastroesophageal reflux disease)   . History of kidney stones   . Hypertension 09/25/2018  . Hyperuricemia   . Insomnia 09/25/2018  . Lumbar radiculopathy 11/28/2017   Elizabeth [name] is a 75 y.o. female with low back pain, lower extremity pain, on the left, in the setting of previous laminotomy in April 2019.   Last Assessment & Plan:  Today I will treat with current OTC medications..   Ms. Jorgenson will return to the clinic on an as needed basis.  I will asses the efficacy of this treatment and make adjustments to her treatment plan as necessary.  Last Assessment &  . Lumbar stenosis   . Osteoarthrosis   . S/P TAVR (transcatheter aortic valve replacement) 05/28/2019   s/p TAVR with a 29 mm Medtronic Evolut Pro + via the TF approach by Dr. Burt Knack and Dr. Cyndia Bent   . Severe aortic stenosis   . Tachycardia 09/25/2018  . Vitamin D deficiency     Past Surgical History:  Procedure Laterality Date  . ABDOMINAL HYSTERECTOMY    . ABDOMINAL HYSTERECTOMY    . APPENDECTOMY    . BACK SURGERY    . CARDIAC CATHETERIZATION  03/11/2019  . CHOLECYSTECTOMY    . GALLBLADDER SURGERY    . INTRAOPERATIVE TRANSTHORACIC ECHOCARDIOGRAM  05/28/2019   Procedure: Intraoperative Transthoracic Echocardiogram;  Surgeon: Sherren Mocha, MD;  Location: [facility];  Service: Open Heart Surgery;;  . IR RADIOLOGY PERIPHERAL GUIDED IV START  05/14/2019  .  IR US GUIDE VASC ACCESS RIGHT  05/14/2019  . LUMBAR LAMINECTOMY/DECOMPRESSION MICRODISCECTOMY N/A 06/12/2017   Procedure: LAMINECTOMY AND FORAMINOTOMY LUMBAR THREE- LUMBAR FOUR, LUMBAR FOUR- LUMBAR FIVE;  Surgeon: Newman Pies, MD;  Location: [facility];  Service: Neurosurgery;  Laterality: N/A;  . RIGHT/LEFT HEART CATH AND CORONARY ANGIOGRAPHY N/A 03/11/2019   Procedure: RIGHT/LEFT HEART CATH AND CORONARY ANGIOGRAPHY;  Surgeon: Sherren Mocha, MD;  Location: [facility];  Service: Cardiovascular;  Laterality: N/A;  . TONSILLECTOMY    .  TRANSCATHETER AORTIC VALVE REPLACEMENT, TRANSFEMORAL N/A 05/28/2019   Procedure: TRANSCATHETER AORTIC VALVE REPLACEMENT, TRANSFEMORAL;  Surgeon: Sherren Mocha, MD;  Location: [facility];  Service: Open Heart Surgery;  Laterality: N/A;    Family History  Problem Relation Age of Onset  . Alzheimer's disease Mother   . Aneurysm Father   . Stomach cancer Brother     Social History   Socioeconomic History  . Marital status: Married    Spouse name: Not on file  . Number of children: 2  . Years of education: 75  . Highest education level: Not on file  Occupational History  . Occupation: child care  Tobacco Use  . Smoking status: Never Smoker  . Smokeless tobacco: Never Used  Vaping Use  . Vaping Use: Never used  Substance and Sexual Activity  . Alcohol use: No  . Drug use: No  . Sexual activity: Not on file  Other Topics Concern  . Not on file  Social History Narrative   Lives with husband in a one story home.  Has 2 children.  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  Education: high school.     Social Determinants of Health   Financial Resource Strain: Not on file  Food Insecurity: Not on file  Transportation Needs: Not on file  Physical Activity: Not on file  Stress: Not on file  Social Connections: Not on file  Intimate Partner Violence: Not on file    Allergies  Allergen Reactions  . Penicillins Hives and Other (See Comments)    Has patient had a PCN reaction causing immediate rash, facial/tongue/throat swelling, SOB or lightheadedness with hypotension:    #  #  YES  #  #  Has patient had a PCN reaction causing severe rash involving mucus membranes or skin necrosis:    #  #  YES  #  #  Has patient had a PCN reaction that required hospitalization: No Has patient had a PCN reaction occurring within the last 10 years: No   Has patient had a PCN reaction causing immediate rash, facial/tongue/throat swelling, SOB or lightheadedness with hypotension: Yes Has patient had a PCN  reaction causing severe rash involving mucus membranes or skin necrosis: Yes Has patient had a PCN reaction that required hospitalization: No Has patient had a PCN reaction occurring within the last 10 years: No If all of the above answers are "NO", then may proceed with Cephalosporin use. Has patient had a PCN reaction causing immediate rash, facial/tongue/throat swelling, SOB or lightheadedness with hypotension:  # # YES # #  Has patient had a PCN reaction causing severe rash involving mucus membranes or skin necrosis:  # # YES # #  Has patient had a PCN reaction that required hospitalization: No Has patient had a PCN reaction occurring within the last 10 years: No  . Atorvastatin Itching and Other (See Comments)    Redness and itching all over body Redness and itching all over body Redness and itching all  over body  . Codeine Nausea And Vomiting  . Prednisone Other (See Comments)    Red and hot all over  Red and hot all over  Red and hot all over     Current Outpatient Medications  Medication Sig Dispense Refill  . amLODipine (NORVASC) 5 MG tablet TAKE 1 TABLET(5 MG) BY MOUTH DAILY 90 tablet 2  . aspirin EC 81 MG tablet Take 1 tablet (81 mg total) by mouth daily. 90 tablet 3  . carvedilol (COREG) 6.25 MG tablet Take 1 tablet (6.25 mg total) by mouth 2 (two) times daily with a meal. 180 tablet 3  . furosemide (LASIX) 20 MG tablet Take 20 mg by mouth every other day.    . lisinopril (ZESTRIL) 10 MG tablet Take 1 tablet (10 mg total) by mouth daily. 90 tablet 3  . Omega-3 Fatty Acids (OMEGA 3 500 PO) Take 500 mg by mouth daily.    . pantoprazole (PROTONIX) 40 MG tablet Take 1 tablet (40 mg total) by mouth daily. 90 tablet 3  . tamoxifen (NOLVADEX) 20 MG tablet Take 1 tablet (20 mg total) by mouth daily. 90 tablet 3  . TART CHERRY PO Take 7,000 mg by mouth. Take 2 tablets daily    . tiZANidine (ZANAFLEX) 2 MG tablet Take 2 mg by mouth every 8 (eight) hours.     No current  facility-administered medications for this visit.    REVIEW OF SYSTEMS:  [X]  denotes positive finding, [ ]  denotes negative finding Cardiac  Comments:  Chest pain or chest pressure:    Shortness of breath upon exertion:    Short of breath when lying flat:    Irregular heart rhythm:        Vascular    Pain in calf, thigh, or hip brought on by ambulation: x As per HPI -  pain with changing positions  Pain in feet at night that wakes you up from your sleep:  x   Blood clot in your veins:    Leg swelling:         Pulmonary    Oxygen at home:    Productive cough:     Wheezing:         Neurologic    Sudden weakness in arms or legs:     Sudden numbness in arms or legs:     Sudden onset of difficulty speaking or slurred speech:    Temporary loss of vision in one eye:     Problems with dizziness:         Gastrointestinal    Blood in stool:     Vomited blood:         Genitourinary    Burning when urinating:     Blood in urine:        Psychiatric    Major depression:         Hematologic    Bleeding problems:    Problems with blood clotting too easily:        Skin    Rashes or ulcers:        Constitutional    Fever or chills:     PHYSICAL EXAM:   Vitals:   05/12/20 1246  BP: 134/72  Pulse: 62  Resp: 20  Temp: (!) 97.3 F (36.3 C)  SpO2: 96%  Weight: 233 lb (105.7 kg)  Height: 5\' 5"  (1.651 m)   Constitutional: well appearing in no distress. Appears well nourished.  Neurologic: CN intact. no focal findings. no  sensory loss. Psychiatric: Mood and affect symmetric and appropriate. Eyes: No icterus. No conjunctival pallor. Ears, nose, throat: mucous membranes moist. Midline trachea.  Cardiac: regular rate and rhythm.  Respiratory: unlabored. Abdominal: soft, non-tender, non-distended.  Peripheral vascular:  2+ DP BLE Extremity: No edema. No cyanosis. No pallor.  Skin: No gangrene. No ulceration.  Lymphatic: No Stemmer's sign. No palpable  lymphadenopathy.   DATA REVIEW:    Most recent CBC CBC Latest Ref Rng & Units 05/29/2019 05/28/2019 05/28/2019  WBC 4.0 - 10.5 K/uL 6.0 - -  Hemoglobin 12.0 - 15.0 g/dL 10.2(L) 9.5(L) 9.2(L)  Hematocrit 36.0 - 46.0 % 30.8(L) 28.0(L) 27.0(L)  Platelets 150 - 400 K/uL 122(L) - -     Most recent CMP CMP Latest Ref Rng & Units 04/28/2020 07/29/2019 05/29/2019  Glucose 65 - 99 mg/dL 90 86 137(H)  BUN 8 - 27 mg/dL 14 24 15   Creatinine 0.57 - 1.00 mg/dL 0.96 1.04(H) 0.82  Sodium 134 - 144 mmol/L 144 142 140  Potassium 3.5 - 5.2 mmol/L 4.7 4.0 3.5  Chloride 96 - 106 mmol/L 108(H) 107(H) 109  CO2 20 - 29 mmol/L 18(L) 22 20(L)  Calcium 8.7 - 10.3 mg/dL 9.2 8.7 8.2(L)  Total Protein 6.0 - 8.5 g/dL 6.8 - -  Total Bilirubin 0.0 - 1.2 mg/dL 0.3 - -  Alkaline Phos 44 - 121 IU/L 64 - -  AST 0 - 40 IU/L 19 - -  ALT 0 - 32 IU/L 11 - -    Renal function CrCl cannot be calculated (Unknown ideal weight.).  Hgb A1c MFr Bld (%)  Date Value  05/24/2019 5.0    LDL Chol Calc (NIH)  Date Value Ref Range Status  04/28/2020 115 (H) 0 - 99 mg/dL Final     Vascular Imaging: ABI 05/12/20   Yevonne Aline. Stanford Breed, MD [facility] Phone Number: [phone] 05/12/2020 12:37 PM

## 2020-05-13 NOTE — Progress Notes (Unsigned)
HEART AND [place]                                     Cardiology Office Note:    Date:  05/14/2020   ID:  Elizabeth [name], Elizabeth [name] December 25, 1945, MRN [id number]  PCP:  Marco Collie, MD  [facility] Cardiologist:  Dr. Bettina Gavia Dr. Burt Knack & Dr. Cyndia Bent (TAVR)  [facility] Electrophysiologist:  None   Referring MD: Marco Collie, MD   CC: 1 year s/p TAVR  History of Present Illness:    Elizabeth [name] is a 75 y.o. female with a history of HTN, CKD stage II, obesity, chronic diastolic CHF, and severe ASs/p TAVR (05/28/19) who presents to clinic for follow up.   She was admitted with congestive heart failure in 09/10/2018 and had an echocardiogram performed which showed an ejection fraction of 55 to 60%. This reportedly showed a bicuspid aortic valve with severe thickening and moderate calcification and a peak velocity of 4.2 m/s with an aortic valve area of 0.6 to 0.7 cm consistent with severe aortic stenosis. She had a follow-up echocardiogram on 12/31/2018 which showed an aortic valve mean gradient of 49 mmHg with a peak gradient of 69 mmHg and an aortic valve area by VTI measuring 0.58 cm. Left ventricular ejection fraction was 55 to 60%. She was seen by Dr. Bettina Gavia in November 2020 and referred for cardiac catheterization which was performed on12/20/2020. This showed nonobstructive coronary disease and a mean gradient of 43 mmHg across the aortic valve consistent with severe aortic stenosis. Right heart pressures were within normal limits. She was scheduled for TAVR work-up and plan potential surgery in mid January but had to delay her appointment so that she could have a noninvasive knee procedure. She decided that she did not want to cancel the knee procedure due to the amount of pain that she was having.Then she became very ill with suspected COVID-19 infection along with multiple other family members.She lost over 25 pounds and was very  weak and fatigued.  The patient was evaluated by the multidisciplinary valve team and underwent successful TAVR with a71mm Medtronic Evolut Pro +THV via the TF approach on 05/28/19. Post operative echo showed EF 55%, normally functioning TAVR with an elevated mean gradient of 20 mm hg (felt to be 2/2 patient prosthesis mismatch) and mild-mod PVL.She was discharged on aspirin and plavix. Her 1 month Echo  07/03/19 shows EF 55%, normally functioning TAVR with a mean gradient of 24 mmHg and mild PVL.   Today she presents to the clinic for a follow-up. She reports feeling great. Her only concern is leg pain, which is being managed by PCP. She denies any shortness of breath, chest pain, or palpitation. No LE swelling, PND or orthopnea. No dizziness of syncope. No blood in urine and stool. Very emotional at the end of the visit when asked about her husband who recently passed away in 09/10/22, unexpectedly.   Past Medical History:  Diagnosis Date  . Anxiety    extreme  . Benign hypertension with CKD (chronic kidney disease), stage II   . Breast cancer ([place])    Pre-cancerous cell in Left breast  . Chronic diastolic heart failure ([place]) 12/20/2018  . Chronic pain of both knees 09/12/2017   [name] is a 75 y.o. female with low back pain, bilateral knee pain, due to multilevel multifactorial  degenerative changes in the lumbar spine, and, significant multiple joint osteoarthritis.   Last Assessment & Plan:  Today I will treat with bilateral knee injections.   [name] will return to the clinic in 6 months.  I will asses the efficacy of this treatment and make adjustments to her   . CKD (chronic kidney disease), stage III ([place])   . Claustrophobia   . Ductal carcinoma in situ (DCIS) of left breast 11/08/2017  . GERD (gastroesophageal reflux disease)   . History of kidney stones   . Hypertension 09/25/2018  . Hyperuricemia   . Insomnia 09/25/2018  . Lumbar radiculopathy 11/28/2017   [name] is a 75  y.o. female with low back pain, lower extremity pain, on the left, in the setting of previous laminotomy in April 2019.   Last Assessment & Plan:  Today I will treat with current OTC medications..   [name] will return to the clinic on an as needed basis.  I will asses the efficacy of this treatment and make adjustments to her treatment plan as necessary.  Last Assessment &  . Lumbar stenosis   . Osteoarthrosis   . S/P TAVR (transcatheter aortic valve replacement) 05/28/2019   s/p TAVR with a 29 mm Medtronic Evolut Pro + via the TF approach by Dr. Burt Knack and Dr. Cyndia Bent   . Severe aortic stenosis   . Tachycardia 09/25/2018  . Vitamin D deficiency     Past Surgical History:  Procedure Laterality Date  . ABDOMINAL HYSTERECTOMY    . ABDOMINAL HYSTERECTOMY    . APPENDECTOMY    . BACK SURGERY    . CARDIAC CATHETERIZATION  03/11/2019  . CHOLECYSTECTOMY    . GALLBLADDER SURGERY    . INTRAOPERATIVE TRANSTHORACIC ECHOCARDIOGRAM  05/28/2019   Procedure: Intraoperative Transthoracic Echocardiogram;  Surgeon: Sherren Mocha, MD;  Location: [place];  Service: Open Heart Surgery;;  . IR RADIOLOGY PERIPHERAL GUIDED IV START  05/14/2019  . IR US GUIDE VASC ACCESS RIGHT  05/14/2019  . LUMBAR LAMINECTOMY/DECOMPRESSION MICRODISCECTOMY N/A 06/12/2017   Procedure: LAMINECTOMY AND FORAMINOTOMY LUMBAR THREE- LUMBAR FOUR, LUMBAR FOUR- LUMBAR FIVE;  Surgeon: Newman Pies, MD;  Location: [place];  Service: Neurosurgery;  Laterality: N/A;  . RIGHT/LEFT HEART CATH AND CORONARY ANGIOGRAPHY N/A 03/11/2019   Procedure: RIGHT/LEFT HEART CATH AND CORONARY ANGIOGRAPHY;  Surgeon: Sherren Mocha, MD;  Location: [place] [facility];  Service: Cardiovascular;  Laterality: N/A;  . TONSILLECTOMY    . TRANSCATHETER AORTIC VALVE REPLACEMENT, TRANSFEMORAL N/A 05/28/2019   Procedure: TRANSCATHETER AORTIC VALVE REPLACEMENT, TRANSFEMORAL;  Surgeon: Sherren Mocha, MD;  Location: [place];  Service: Open Heart Surgery;  Laterality: N/A;     Current Medications: Current Meds  Medication Sig  . amLODipine (NORVASC) 5 MG tablet TAKE 1 TABLET(5 MG) BY MOUTH DAILY  . aspirin EC 81 MG tablet Take 1 tablet (81 mg total) by mouth daily.  Marland Kitchen azithromycin (ZITHROMAX) 500 MG tablet Take 1 tablet (500 mg) one hour prior to all dental visits.  . carvedilol (COREG) 6.25 MG tablet Take 1 tablet (6.25 mg total) by mouth 2 (two) times daily with a meal.  . colchicine 0.6 MG tablet Take 0.6 mg by mouth 2 (two) times daily.  . furosemide (LASIX) 20 MG tablet Take 20 mg by mouth every other day.  . lisinopril (ZESTRIL) 10 MG tablet Take 1 tablet (10 mg total) by mouth daily.  . Omega-3 Fatty Acids (OMEGA 3 500 PO) Take 500 mg by mouth daily.  Marland Kitchen  pantoprazole (PROTONIX) 40 MG tablet Take 1 tablet (40 mg total) by mouth daily.  . tamoxifen (NOLVADEX) 20 MG tablet Take 1 tablet (20 mg total) by mouth daily.  Marland Kitchen TART CHERRY PO Take 7,000 mg by mouth. Take 2 tablets daily  . tiZANidine (ZANAFLEX) 2 MG tablet Take 2 mg by mouth every 8 (eight) hours.     Allergies:   Penicillins, Atorvastatin, Codeine, and Prednisone   Social History   Socioeconomic History  . Marital status: Married    Spouse name: Not on file  . Number of children: 2  . Years of education: 37  . Highest education level: Not on file  Occupational History  . Occupation: child care  Tobacco Use  . Smoking status: Never Smoker  . Smokeless tobacco: Never Used  Vaping Use  . Vaping Use: Never used  Substance and Sexual Activity  . Alcohol use: No  . Drug use: No  . Sexual activity: Not on file  Other Topics Concern  . Not on file  Social History Narrative   Lives with husband in a one story home.  Has 2 children.  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  Education: high school.     Social Determinants of Health   Financial Resource Strain: Not on file  Food Insecurity: Not on file  Transportation Needs: Not on file  Physical Activity: Not on file  Stress: Not on file   Social Connections: Not on file     Family History: The patient's *family history includes Alzheimer's disease in her mother; Aneurysm in her father; Stomach cancer in her brother.  ROS:   Please see the history of present illness.    All other systems reviewed and are negative.  EKGs/Labs/Other Studies Reviewed:    The following studies were reviewed today:  EKG:  EKG is ordered today. EKG reveals sinus arrhythmia with HR 60.  TAVR OPERATIVE NOTE  Date of Procedure:05/28/2019  Preoperative Diagnosis:Severe Aortic Stenosis   Postoperative Diagnosis:Same   Procedure:   Transcatheter Aortic Valve Replacement - PercutaneousRightTransfemoral Approach Medtronic Evolut Pro +(size56mm, serial # [id number])  Co-Surgeons:Bryan Alveria Apley, MD and Sherren Mocha, MD   Anesthesiologist:S. Gifford Shave, MD  Anne Hahn, MD  Pre-operative Echo Findings: ? Severe aortic stenosis ? Normal left ventricular systolic function  Post-operative Echo Findings: ? Mildparavalvular leak ? Normal left ventricular systolic function  _____________  Echo3/17/21: IMPRESSIONS  1. Left ventricular ejection fraction, by estimation, is 60 to 65%. The  left ventricle has normal function. The left ventricle has no regional  wall motion abnormalities. There is moderate concentric left ventricular  hypertrophy. Left ventricular  diastolic parameters are consistent with Grade II diastolic dysfunction  (pseudonormalization).  2. Right ventricular systolic function is normal. The right ventricular  size is normal.  3. Left atrial size was moderately dilated.  4. Right atrial size was mildly dilated.  5. The mitral valve is normal in structure. Mild mitral valve  regurgitation. No evidence of mitral stenosis.  6. Mild to moderate paravalvular . The aortic  valve has been  repaired/replaced. Aortic valve regurgitation is not visualized. No aortic  stenosis is present. There is a 29 mm Medtronic, stented (TAVR) valve  present in the aortic position. Procedure Date:  05/28/2019. Aortic valve mean gradient measures 20.0 mmHg.  7. The inferior vena cava is normal in size with greater than 50%  respiratory variability, suggesting right atrial pressure of 3 mmHg.  _______________  Echo 07/03/19 IMPRESSIONS 1. Left ventricular ejection fraction, by  estimation, is 55 to 60%. The  left ventricle has normal function. The left ventricle has no regional  wall motion abnormalities. There is moderate concentric left ventricular  hypertrophy. Left ventricular  diastolic parameters are consistent with Grade II diastolic dysfunction  (pseudonormalization).  2. Right ventricular systolic function is normal. The right ventricular  size is normal. There is normal pulmonary artery systolic pressure.  3. Left atrial size was moderately dilated.  4. The mitral valve is abnormal, with moderate posterior mitral annular  calcification. Mild mitral valve regurgitation. No evidence of mitral  stenosis.  5. Well seated bioprosthetic 29 mm medtronis stented valve, s/p TAVR.  Aortic valve regurgitation is mild. Possible stenosis of the biprosthesis.  Aortic valve mean gradient measures 24.0 mmHg. Aortic valve Vmax measures  3.11 m/s.  6. There is mild dilatation of the ascending aorta measuring 39 mm.  7. The inferior vena cava is normal in size with greater than 50%  respiratory variability, suggesting right atrial pressure of 3 mmHg.   ___________ Echo 05/14/20 IMPRESSIONS  1. Left ventricular ejection fraction, by estimation, is 60 to 65%. The  left ventricle has normal function. The left ventricle has no regional  wall motion abnormalities. Left ventricular diastolic parameters are  consistent with Grade II diastolic  dysfunction  (pseudonormalization). Elevated left ventricular end-diastolic  pressure. The average left ventricular global longitudinal strain is -22.3  %. The global longitudinal strain is normal.  2. Right ventricular systolic function is normal. The right ventricular  size is normal. There is normal pulmonary artery systolic pressure. The  estimated right ventricular systolic pressure is 81.1 mmHg.  3. Left atrial size was mild to moderately dilated.  4. The mitral valve is normal in structure. Mild mitral valve  regurgitation. No evidence of mitral stenosis. Moderate mitral annular  calcification.  5. The aortic valve has been repaired/replaced. Perivalvular Aortic valve  regurgitation is mild. There is a 29 mm stented (TAVR) valve present in  the aortic position. Procedure Date: 05/28/19. The gradients across the  AVR are mildly elevated with an  Aortic valve mean gradient measuring 21.0 mmHg. Aortic valve Vmax measures  3.15 m/s. DI is 0.32.  6. The inferior vena cava is normal in size with greater than 50%  respiratory variability, suggesting right atrial pressure of 3 mmHg.  7. Compared to echo 07/03/2019, the mean AVG has decreased from 24 to  69mmHg.  Recent Labs: 05/24/2019: B Natriuretic Peptide 121.3 05/29/2019: Hemoglobin 10.2; Magnesium 1.9; Platelets 122 04/28/2020: ALT 11; BUN 14; Creatinine, Ser 0.96; NT-Pro BNP 431; Potassium 4.7; Sodium 144  Recent Lipid Panel    Component Value Date/Time   CHOL 198 04/28/2020 1149   TRIG 162 (H) 04/28/2020 1149   HDL 55 04/28/2020 1149   CHOLHDL 3.6 04/28/2020 1149   LDLCALC 115 (H) 04/28/2020 1149     Risk Assessment/Calculations:       Physical Exam:    VS:  BP 120/70   Pulse 60   Ht 5' 5.5" (1.664 m)   Wt 192 lb 9.6 oz (87.4 kg)   SpO2 94%   BMI 31.56 kg/m     Wt Readings from Last 3 Encounters:  05/14/20 192 lb 9.6 oz (87.4 kg)  05/12/20 233 lb (105.7 kg)  04/28/20 233 lb (105.7 kg)     GEN:  Well nourished, well  developed in no acute distress HEENT: Normal NECK: No JVD; No carotid bruits LYMPHATICS: No lymphadenopathy CARDIAC: RRR, soft flow murmur. No rubs or gallops  RESPIRATORY:  Clear to auscultation without rales, wheezing or rhonchi  ABDOMEN: Soft, non-tender, non-distended MUSCULOSKELETAL:  No edema; No deformity  SKIN: Warm and dry NEUROLOGIC:  Alert and oriented x 3 PSYCHIATRIC:  Normal affect   ASSESSMENT:    1. S/P TAVR (transcatheter aortic valve replacement)   2. Essential hypertension   3. Chronic diastolic heart failure (HCC)    PLAN:    In order of problems listed above:  Severe AS s/p TAVR: Her echo today reveals EF 60-65 % with mean gradient 21 mm hg, mild PVL. She has NYHA class I symptoms. She is on aspirin indefinitely. She understand the needs for ongoing SBE. Will change her SBE prophylaxis from clindamycin to azithromycin due to higher risk of cdif with clindamycin. She will continue regular care with Dr. Bettina Gavia.   HTN: Blood pressure stable today. Continue amlodipine, carvedilol, and lisinopril. She has a follow-up with her cardiologist in August.  Chronic diastolic CHF: No CHF symptoms. Continue on beta-blocker, lasix, and lisinopril.      Medication Adjustments/Labs and Tests Ordered: Current medicines are reviewed at length with the patient today.  Concerns regarding medicines are outlined above.  Orders Placed This Encounter  Procedures  . EKG 12-Lead   Meds ordered this encounter  Medications  . azithromycin (ZITHROMAX) 500 MG tablet    Sig: Take 1 tablet (500 mg) one hour prior to all dental visits.    Dispense:  3 tablet    Refill:  11    Patient Instructions  Medication Instructions:  Your provider discussed the importance of taking an antibiotic prior to all dental visits to prevent damage to the heart valves from infection. You were given a prescription for AZITHROMYCIN 500 mg to take one hour prior to any dental appointment.  *If you need a  refill on your cardiac medications before your next appointment, please call your pharmacy*   Follow-Up: Please keep your appointment with Dr. Bettina Gavia as scheduled.    Signed, Ramiro Harvest, RN  05/14/2020 3:46 PM    [facility]

## 2020-05-14 ENCOUNTER — Other Ambulatory Visit: Payer: Self-pay

## 2020-05-14 ENCOUNTER — Ambulatory Visit: Payer: Medicare Other | Admitting: Physician Assistant

## 2020-05-14 ENCOUNTER — Encounter: Payer: Self-pay | Admitting: Physician Assistant

## 2020-05-14 ENCOUNTER — Ambulatory Visit (HOSPITAL_COMMUNITY): Payer: Medicare Other | Attending: Cardiology

## 2020-05-14 VITALS — BP 120/70 | HR 60 | Ht 65.5 in | Wt 192.6 lb

## 2020-05-14 DIAGNOSIS — I10 Essential (primary) hypertension: Secondary | ICD-10-CM

## 2020-05-14 DIAGNOSIS — Z952 Presence of prosthetic heart valve: Secondary | ICD-10-CM | POA: Diagnosis not present

## 2020-05-14 DIAGNOSIS — I5032 Chronic diastolic (congestive) heart failure: Secondary | ICD-10-CM

## 2020-05-14 LAB — ECHOCARDIOGRAM COMPLETE
AR max vel: 0.93 cm2
AV Area VTI: 1.01 cm2
AV Area mean vel: 1.06 cm2
AV Mean grad: 21 mmHg
AV Peak grad: 39.7 mmHg
Ao pk vel: 3.15 m/s
Area-P 1/2: 2.52 cm2
S' Lateral: 3.1 cm

## 2020-05-14 MED ORDER — AZITHROMYCIN 500 MG PO TABS: ORAL_TABLET | ORAL | 11 refills | Status: DC

## 2020-05-14 NOTE — Patient Instructions (Signed)
Medication Instructions:  Your provider discussed the importance of taking an antibiotic prior to all dental visits to prevent damage to the heart valves from infection. You were given a prescription for AZITHROMYCIN 500 mg to take one hour prior to any dental appointment.  *If you need a refill on your cardiac medications before your next appointment, please call your pharmacy*   Follow-Up: Please keep your appointment with Dr. Bettina Gavia as scheduled.

## 2020-05-18 DIAGNOSIS — R002 Palpitations: Secondary | ICD-10-CM | POA: Diagnosis not present

## 2020-05-25 ENCOUNTER — Telehealth: Payer: Self-pay

## 2020-05-25 NOTE — Telephone Encounter (Signed)
Spoke with patient regarding results and recommendation.  Patient verbalizes understanding and is agreeable to plan of care. Advised patient to call back with any issues or concerns.  

## 2020-05-25 NOTE — Telephone Encounter (Signed)
-----   Message from Elizabeth Priest, MD sent at 05/23/2020 10:51 AM EST ----- We did this because she was having an alert on her blood pressure device Fortunately no episodes of atrial fibrillation or flutter I do not think she requires any additional cardiac medications at this time

## 2020-05-26 ENCOUNTER — Ambulatory Visit
Admission: RE | Admit: 2020-05-26 | Discharge: 2020-05-26 | Disposition: A | Discharge status: Home / Self Care | Payer: Medicare Other | Source: Ambulatory Visit | Attending: Hematology and Oncology | Admitting: Hematology and Oncology

## 2020-05-26 ENCOUNTER — Other Ambulatory Visit: Payer: Self-pay

## 2020-05-26 DIAGNOSIS — R922 Inconclusive mammogram: Secondary | ICD-10-CM | POA: Diagnosis not present

## 2020-05-26 DIAGNOSIS — D0512 Intraductal carcinoma in situ of left breast: Secondary | ICD-10-CM

## 2020-05-26 IMAGING — MG MM DIGITAL DIAGNOSTIC UNILAT*L* W/ TOMO W/ CAD
6 series · 6 of 14 positions shown · non-contrast
Comparison: Previous exam(s).

CLINICAL DATA: 74-year-old female with left breast DCIS diagnosed
in [DATE]. The patient is currently taking tamoxifen and has
not had surgery.

EXAM:
DIGITAL DIAGNOSTIC UNILATERAL LEFT MAMMOGRAM WITH TOMOSYNTHESIS AND
CAD
TECHNIQUE: Left digital diagnostic mammography and breast tomosynthesis was
performed. The images were evaluated with computer-aided detection.

[L CC (1 of 2)]
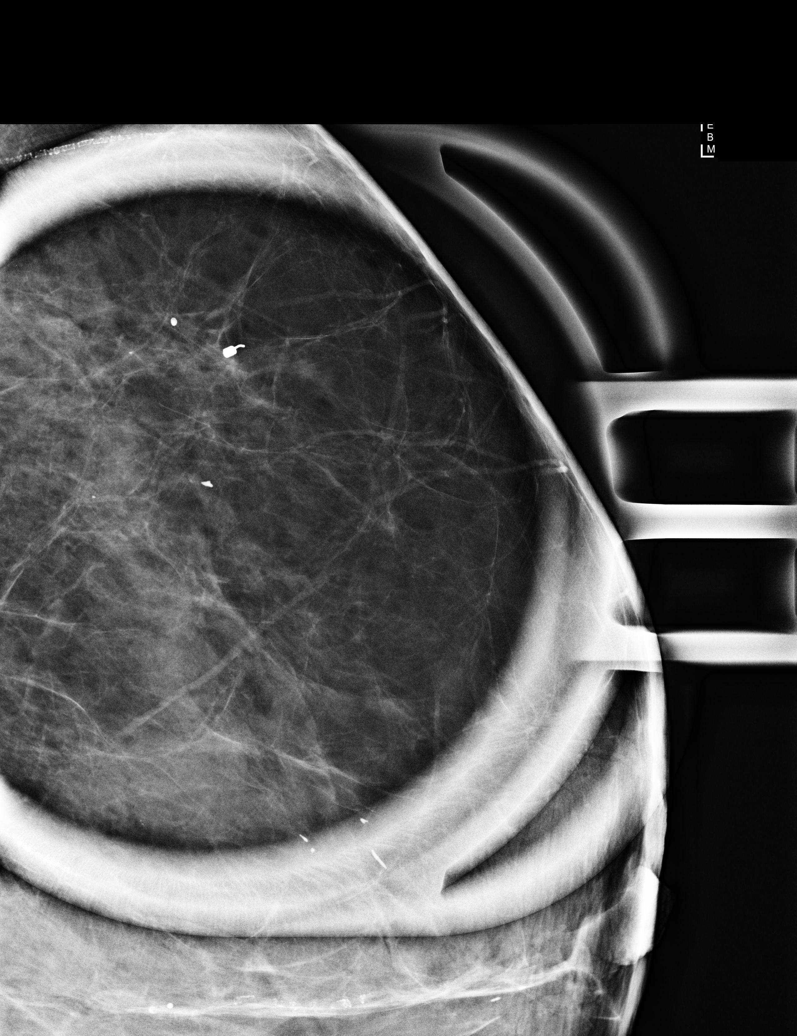

[L CC (2 of 2)]
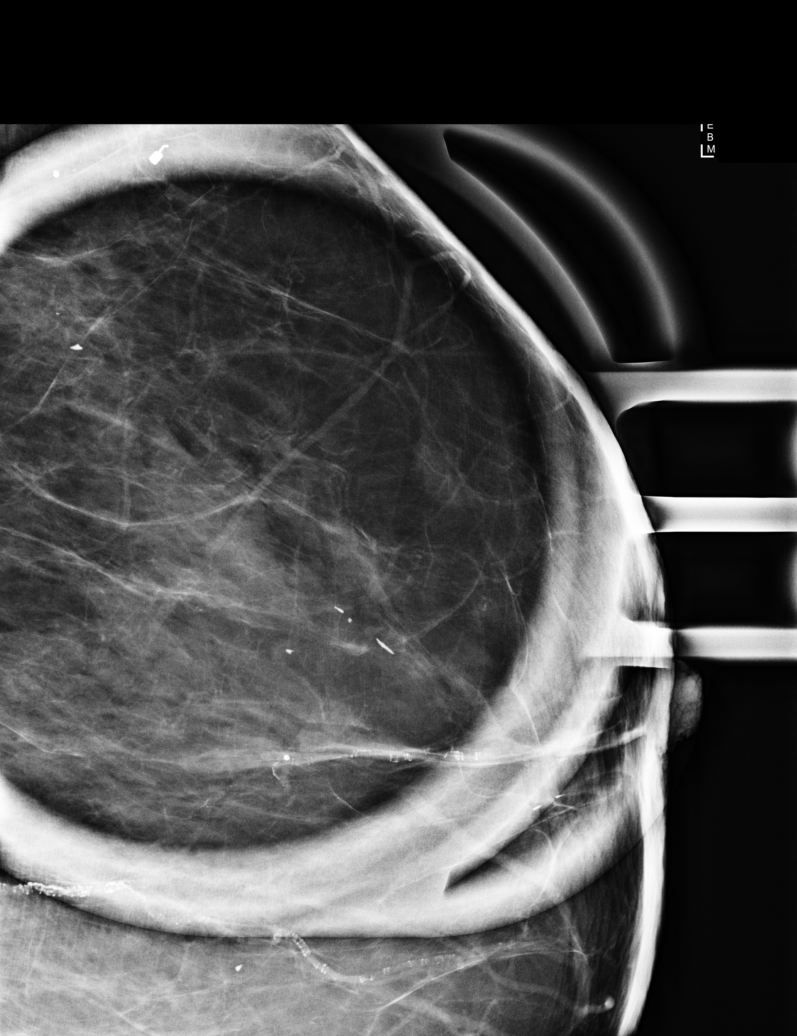

[L MLO synth-2D]
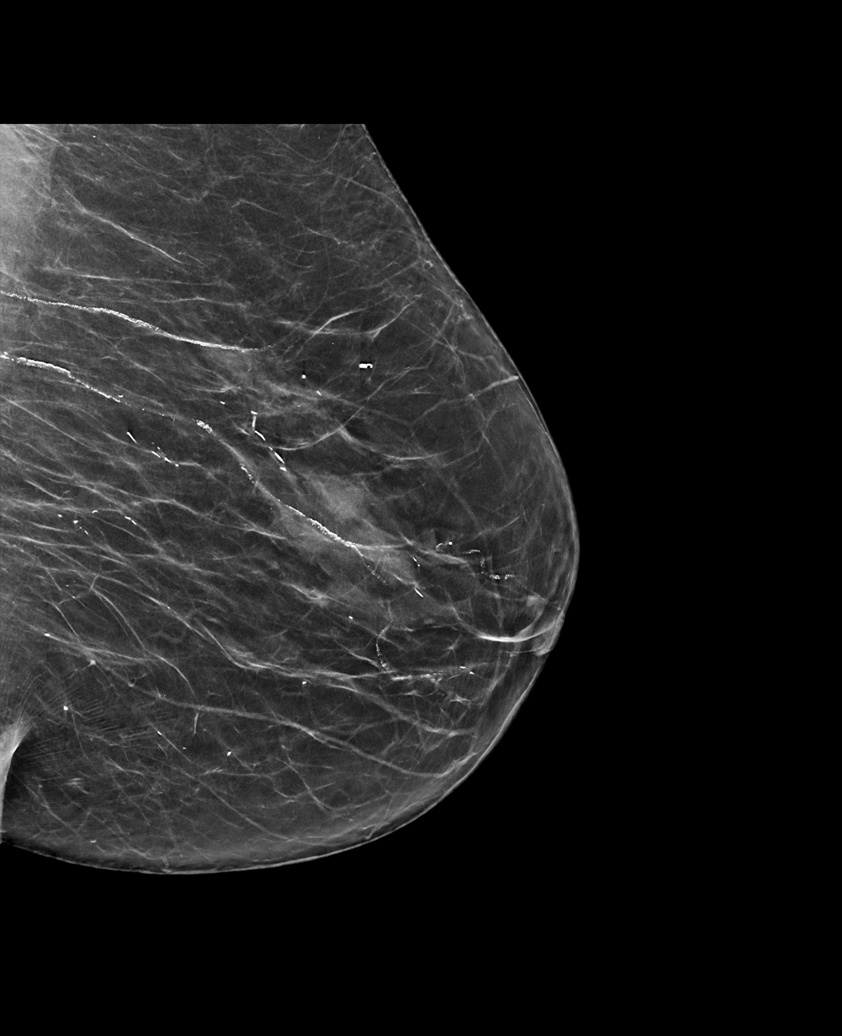

[L CC synth-2D]
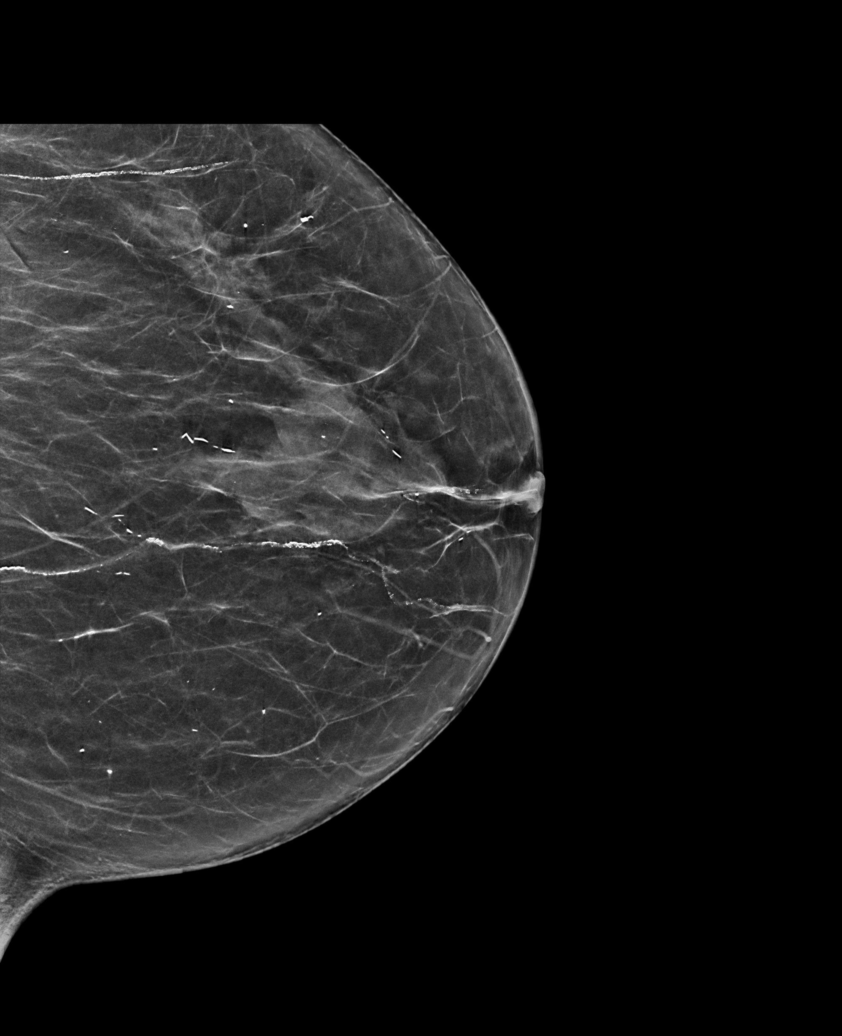

[L CC tomo · tomo slice 31/60.0]
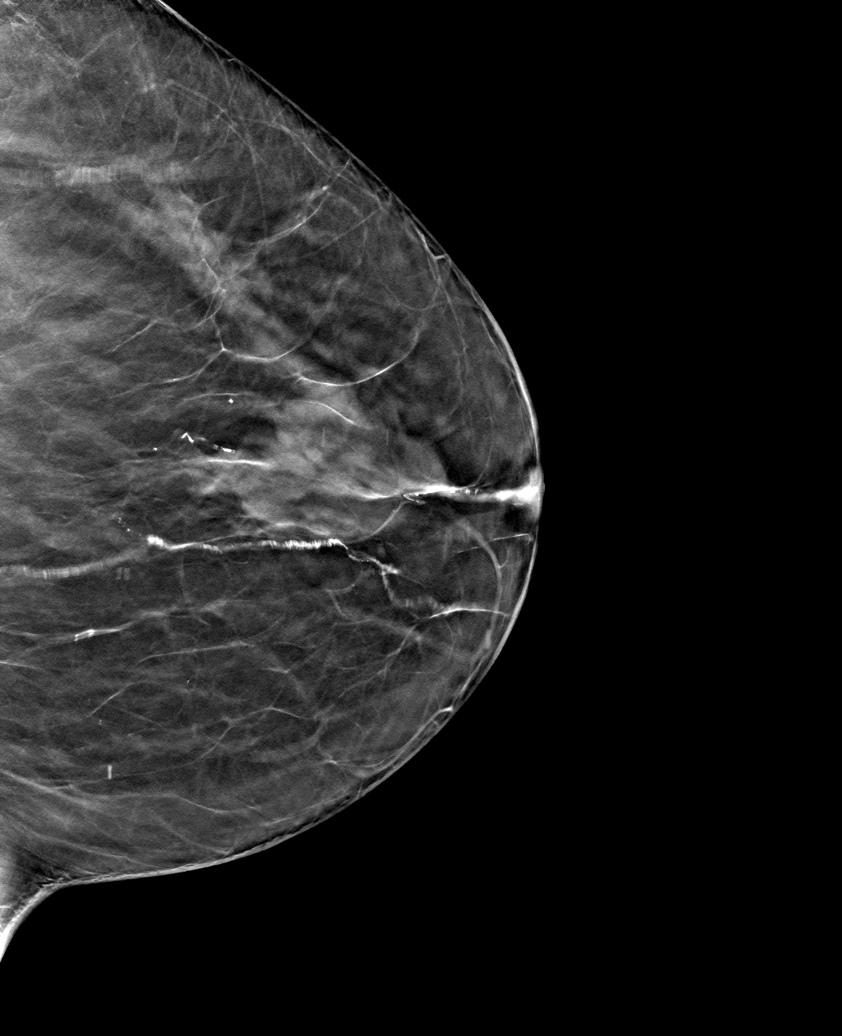

[L MLO tomo · tomo slice 33/65.0]
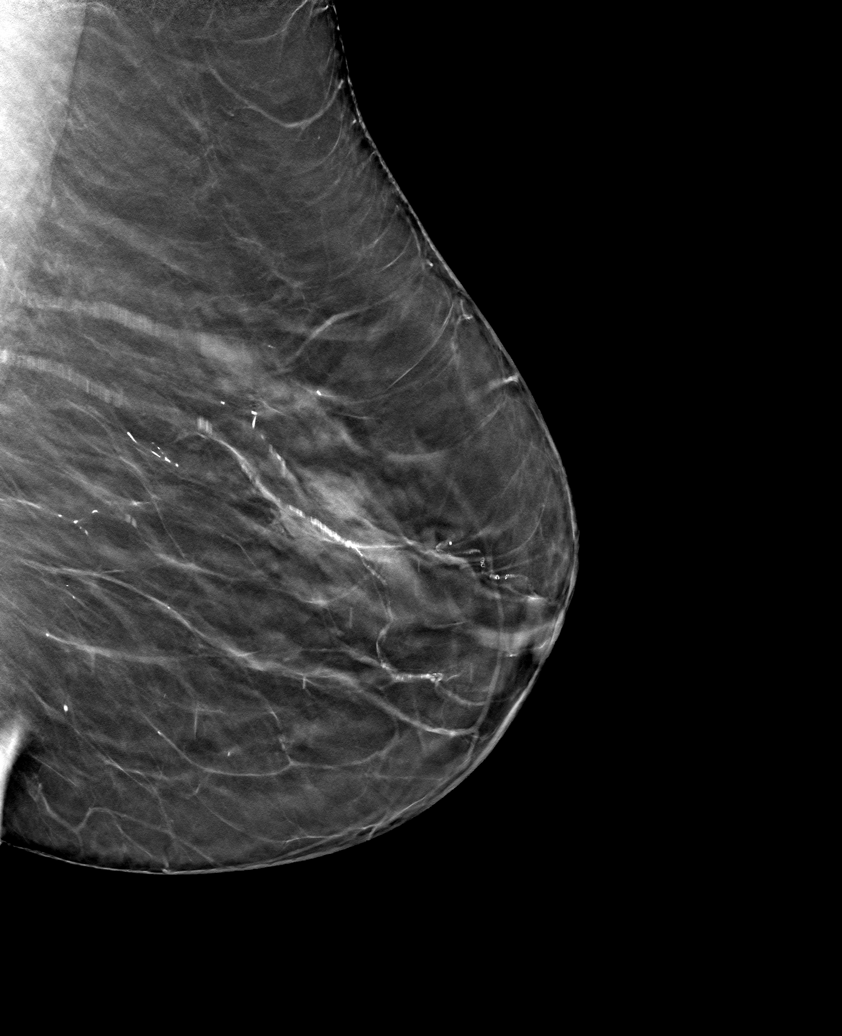

[6 of 14 positions shown; findings below may reference images not displayed]

ACR Breast Density Category b: There are scattered areas of
fibroglandular density.
FINDINGS: Post biopsy changes again noted in the upper-outer left breast. No
recurrent masses or calcifications in this region. No new or
suspicious findings in the remainder of the left breast.
IMPRESSION: Stable appearance of the left breast.

RECOMMENDATION:
Per clinical treatment plan. The patient is due for bilateral
mammography in [DATE].

I have discussed the findings and recommendations with the patient.
If applicable, a reminder letter will be sent to the patient
regarding the next appointment.

BI-RADS CATEGORY  6: Known biopsy-proven malignancy.

## 2020-05-27 ENCOUNTER — Encounter: Payer: Medicare Other | Attending: Physical Medicine & Rehabilitation | Admitting: Physical Medicine & Rehabilitation

## 2020-05-27 ENCOUNTER — Encounter: Payer: Self-pay | Admitting: Physical Medicine & Rehabilitation

## 2020-05-27 ENCOUNTER — Other Ambulatory Visit: Payer: Self-pay

## 2020-05-27 VITALS — BP 140/65 | HR 59 | Temp 98.8°F | Ht 65.5 in | Wt 230.0 lb

## 2020-05-27 DIAGNOSIS — G5751 Tarsal tunnel syndrome, right lower limb: Secondary | ICD-10-CM | POA: Diagnosis not present

## 2020-05-27 DIAGNOSIS — M79609 Pain in unspecified limb: Secondary | ICD-10-CM

## 2020-05-27 DIAGNOSIS — M170 Bilateral primary osteoarthritis of knee: Secondary | ICD-10-CM | POA: Diagnosis not present

## 2020-05-27 HISTORY — DX: Pain in unspecified limb: M79.609

## 2020-05-27 MED ORDER — OXYCODONE HCL 5 MG PO TABS: 5.0000 mg | ORAL_TABLET | Freq: Three times a day (TID) | ORAL | 0 refills | Status: DC | PRN

## 2020-05-27 NOTE — Progress Notes (Signed)
Subjective:    Patient ID: Elizabeth [name], female    DOB: January 31, 1946, 75 y.o.   MRN: [id number]  HPI   Elizabeth [name] is here in follow up of her bilateral leg pain. I last saw her in September when we did monovisc injections. The monovisc injections helped immensely with her knee pain. Over the last month or two her pain has increased again. She had a negative vascular work up. A lot of her pain starts in her lower thighs and radiates into her calves/feet. The pain is worst when she first stands up and she's limited with gait as well. She needs a cane for balance and pain tolerance.  She feels that her quality of life is really suffering right now due to her intense pain.  We talked a bit about her prior work-up which included an orthopedic assessment where arthroscopic surgery was discussed.  Apparently at that point she was not ready for this.  She had a geniculate nerve block about a year ago which did not provide dramatic relief.  She was unable to tolerate MRI testing due to severe anxiety.  She had been on medications for pain in the past and the one that seem to provide the best results was oxycodone.  Tramadol was not strong enough and hydrocodone seem to cause her some nausea.   Pain Inventory Average Pain 10 Pain Right Now 10 My pain is burning and aching  In the last 24 hours, has pain interfered with the following? General activity 10 Relation with others 2 Enjoyment of life 10 What TIME of day is your pain at its worst? morning , daytime, evening and night Sleep (in general) Poor  Pain is worse with: walking, sitting and standing Pain improves with: heat/ice Relief from Meds: 0  Family History  Problem Relation Age of Onset  . Alzheimer's disease Mother   . Aneurysm Father   . Stomach cancer Brother    Social History   Socioeconomic History  . Marital status: Married    Spouse name: Not on file  . Number of children: 2  . Years of education: 64  . Highest  education level: Not on file  Occupational History  . Occupation: child care  Tobacco Use  . Smoking status: Never Smoker  . Smokeless tobacco: Never Used  Vaping Use  . Vaping Use: Never used  Substance and Sexual Activity  . Alcohol use: No  . Drug use: No  . Sexual activity: Not on file  Other Topics Concern  . Not on file  Social History Narrative   Lives with husband in a one story home.  Has 2 children.  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  Education: high school.     Social Determinants of Health   Financial Resource Strain: Not on file  Food Insecurity: Not on file  Transportation Needs: Not on file  Physical Activity: Not on file  Stress: Not on file  Social Connections: Not on file   Past Surgical History:  Procedure Laterality Date  . ABDOMINAL HYSTERECTOMY    . ABDOMINAL HYSTERECTOMY    . APPENDECTOMY    . BACK SURGERY    . CARDIAC CATHETERIZATION  03/11/2019  . CHOLECYSTECTOMY    . GALLBLADDER SURGERY    . INTRAOPERATIVE TRANSTHORACIC ECHOCARDIOGRAM  05/28/2019   Procedure: Intraoperative Transthoracic Echocardiogram;  Surgeon: Sherren Mocha, MD;  Location: [facility];  Service: Open Heart Surgery;;  . IR RADIOLOGY PERIPHERAL GUIDED IV START  05/14/2019  .  IR US GUIDE VASC ACCESS RIGHT  05/14/2019  . LUMBAR LAMINECTOMY/DECOMPRESSION MICRODISCECTOMY N/A 06/12/2017   Procedure: LAMINECTOMY AND FORAMINOTOMY LUMBAR THREE- LUMBAR FOUR, LUMBAR FOUR- LUMBAR FIVE;  Surgeon: Newman Pies, MD;  Location: [facility];  Service: Neurosurgery;  Laterality: N/A;  . RIGHT/LEFT HEART CATH AND CORONARY ANGIOGRAPHY N/A 03/11/2019   Procedure: RIGHT/LEFT HEART CATH AND CORONARY ANGIOGRAPHY;  Surgeon: Sherren Mocha, MD;  Location: [facility];  Service: Cardiovascular;  Laterality: N/A;  . TONSILLECTOMY    . TRANSCATHETER AORTIC VALVE REPLACEMENT, TRANSFEMORAL N/A 05/28/2019   Procedure: TRANSCATHETER AORTIC VALVE REPLACEMENT, TRANSFEMORAL;  Surgeon: Sherren Mocha, MD;  Location: [facility];  Service: Open Heart Surgery;  Laterality: N/A;   Past Surgical History:  Procedure Laterality Date  . ABDOMINAL HYSTERECTOMY    . ABDOMINAL HYSTERECTOMY    . APPENDECTOMY    . BACK SURGERY    . CARDIAC CATHETERIZATION  03/11/2019  . CHOLECYSTECTOMY    . GALLBLADDER SURGERY    . INTRAOPERATIVE TRANSTHORACIC ECHOCARDIOGRAM  05/28/2019   Procedure: Intraoperative Transthoracic Echocardiogram;  Surgeon: Sherren Mocha, MD;  Location: [facility];  Service: Open Heart Surgery;;  . IR RADIOLOGY PERIPHERAL GUIDED IV START  05/14/2019  . IR US GUIDE VASC ACCESS RIGHT  05/14/2019  . LUMBAR LAMINECTOMY/DECOMPRESSION MICRODISCECTOMY N/A 06/12/2017   Procedure: LAMINECTOMY AND FORAMINOTOMY LUMBAR THREE- LUMBAR FOUR, LUMBAR FOUR- LUMBAR FIVE;  Surgeon: Newman Pies, MD;  Location: [facility];  Service: Neurosurgery;  Laterality: N/A;  . RIGHT/LEFT HEART CATH AND CORONARY ANGIOGRAPHY N/A 03/11/2019   Procedure: RIGHT/LEFT HEART CATH AND CORONARY ANGIOGRAPHY;  Surgeon: Sherren Mocha, MD;  Location: [facility];  Service: Cardiovascular;  Laterality: N/A;  . TONSILLECTOMY    . TRANSCATHETER AORTIC VALVE REPLACEMENT, TRANSFEMORAL N/A 05/28/2019   Procedure: TRANSCATHETER AORTIC VALVE REPLACEMENT, TRANSFEMORAL;  Surgeon: Sherren Mocha, MD;  Location: [facility];  Service: Open Heart Surgery;  Laterality: N/A;   Past Medical History:  Diagnosis Date  . Anxiety    extreme  . Benign hypertension with CKD (chronic kidney disease), stage II   . Breast cancer ([place])    Pre-cancerous cell in Left breast  . Chronic diastolic heart failure ([place]) 12/20/2018  . Chronic pain of both knees 09/12/2017   Elizabeth [name] is a 75 y.o. female with low back pain, bilateral knee pain, due to multilevel multifactorial degenerative changes in the lumbar spine, and, significant multiple joint osteoarthritis.   Last Assessment & Plan:  Today I will treat with bilateral knee injections.   Elizabeth [name] will return to the clinic in 6  months.  I will asses the efficacy of this treatment and make adjustments to her   . CKD (chronic kidney disease), stage III ([place])   . Claustrophobia   . Ductal carcinoma in situ (DCIS) of left breast 11/08/2017  . GERD (gastroesophageal reflux disease)   . History of kidney stones   . Hypertension 09/25/2018  . Hyperuricemia   . Insomnia 09/25/2018  . Lumbar radiculopathy 11/28/2017   Elizabeth [name] is a 75 y.o. female with low back pain, lower extremity pain, on the left, in the setting of previous laminotomy in April 2019.   Last Assessment & Plan:  Today I will treat with current OTC medications..   Elizabeth [name] will return to the clinic on an as needed basis.  I will asses the efficacy of this treatment and make adjustments to her treatment plan as necessary.  Last Assessment &  . Lumbar stenosis   .  Osteoarthrosis   . S/P TAVR (transcatheter aortic valve replacement) 05/28/2019   s/p TAVR with a 29 mm Medtronic Evolut Pro + via the TF approach by Dr. Burt Knack and Dr. Cyndia Bent   . Severe aortic stenosis   . Tachycardia 09/25/2018  . Vitamin D deficiency    BP 140/65   Pulse (!) 59   Temp 98.8 F (37.1 C)   Ht 5' 5.5" (1.664 m)   Wt 230 lb (104.3 kg)   SpO2 97%   BMI 37.69 kg/m   Opioid Risk Score:   Fall Risk Score:  `1  Depression screen PHQ 2/9  Depression screen [facility] 2/9 09/25/2019 08/21/2019  Decreased Interest 1 0  Down, Depressed, Hopeless 1 0  PHQ - 2 Score 2 0  Altered sleeping - 3  Tired, decreased energy - 2  Change in appetite - 0  Feeling bad or failure about yourself  - 0  Trouble concentrating - 0  Moving slowly or fidgety/restless - 0  Suicidal thoughts - 0  PHQ-9 Score - 5  Difficult doing work/chores - Not difficult at all    Review of Systems  Constitutional: Negative.   HENT: Negative.   Eyes: Negative.   Respiratory: Negative.   Cardiovascular: Negative.   Gastrointestinal: Negative.   Endocrine: Negative.   Genitourinary: Negative.   Musculoskeletal:  Positive for gait problem and myalgias.  Skin: Negative.   Allergic/Immunologic: Negative.   Neurological:       Neuropathy  Hematological: Negative.   Psychiatric/Behavioral: Negative.   All other systems reviewed and are negative.      Objective:   Physical Exam  General: No acute distress HEENT: EOMI, oral membranes moist Cards: reg rate  Chest: normal effort Abdomen: Soft, NT, ND Skin: dry, intact Extremities: no edema Psych: pleasant and appropriate Neuro: Pt is cognitively appropriate with normal insight, memory, and awareness. Cranial nerves 2-12 are intact. Right foot numbness along sole. Motor 5/5.  Musculoskeletal: bilateral knee effusions, pes planus deformities. mcmurray's positive with joint line pain bilateraly.  Both knees are tender in the popliteal area along the lateral joint lines.  She is tender along the distal hamstring tendons as well.  When arising from sitting to standing she experienced pain along the lateral knee as well as the popliteal crease.  I could not discern definitively if there is any type of a mass or swelling in this area.  She walks with antalgic gait using her cane for balance.               Assessment & Plan:  1.  Chronic bilateral knee pain, left greater than right.  Symptoms most consistent with osteoarthritis as well as meniscal disease.  I also question whether Baker's cysts could be contributing to her popliteal/distal thigh pain. 2.  Right foot numbness, questionable tarsal tunnel syndrome given her pes planus deformity 3.  History of lumbar spondylosis with radiculopathy status post lumbar decompression in approximately 2019 4. Obesity 5. AS with recent TAVR 6. Ongoing grieving for her husband who past away   Plan: 1.    Given her aversion to MRI I will reimage both of her knees via x-ray and send her for Dopplers to assess for Baker's cyst. 2.  Continue Voltaren gel to bilateral knees, OTC 3.    Will trial oxycodone 5 mg  every 12 hours as needed #45 for severe pain 4.  Appropriate shoe wear and arch supports   5.  set up for repeat bilateral monovisc to  knees 6.  Still may ultimately need MRI of the knees.  She may need to go into the hospital for these so that she can be sedated       Thirty minutes of face to face patient care time were spent during this visit. All questions were encouraged and answered. Follow up with me in 2 mos.

## 2020-05-27 NOTE — Patient Instructions (Signed)
PLEASE FEEL FREE TO CALL OUR OFFICE WITH ANY PROBLEMS OR QUESTIONS ([phone])      

## 2020-06-01 ENCOUNTER — Ambulatory Visit (HOSPITAL_COMMUNITY)
Admission: RE | Admit: 2020-06-01 | Discharge: 2020-06-01 | Disposition: A | Discharge status: Home / Self Care | Payer: Medicare Other | Source: Ambulatory Visit | Attending: Internal Medicine | Admitting: Internal Medicine

## 2020-06-01 ENCOUNTER — Other Ambulatory Visit: Payer: Self-pay

## 2020-06-01 DIAGNOSIS — M79605 Pain in left leg: Secondary | ICD-10-CM | POA: Diagnosis not present

## 2020-06-01 DIAGNOSIS — M79609 Pain in unspecified limb: Secondary | ICD-10-CM | POA: Diagnosis not present

## 2020-06-01 DIAGNOSIS — M79604 Pain in right leg: Secondary | ICD-10-CM | POA: Diagnosis not present

## 2020-06-02 ENCOUNTER — Telehealth (HOSPITAL_COMMUNITY): Payer: Self-pay | Admitting: Physical Medicine & Rehabilitation

## 2020-06-02 ENCOUNTER — Other Ambulatory Visit: Payer: Medicare Other

## 2020-06-02 DIAGNOSIS — M170 Bilateral primary osteoarthritis of knee: Secondary | ICD-10-CM

## 2020-06-02 DIAGNOSIS — M79609 Pain in unspecified limb: Secondary | ICD-10-CM

## 2020-06-02 MED ORDER — DIAZEPAM 5 MG PO TABS: 5.0000 mg | ORAL_TABLET | Freq: Once | ORAL | 0 refills | Status: AC

## 2020-06-02 NOTE — Telephone Encounter (Signed)
Spoke to patient about her dopplers. Ordered bilateral MRI's of knees as ?cystic structures identified. Also orderd 5-10mg  valium prior to mri's for relaxation if needed

## 2020-06-08 ENCOUNTER — Other Ambulatory Visit: Payer: Self-pay | Admitting: Cardiology

## 2020-06-09 NOTE — Telephone Encounter (Signed)
Refill sent to pharmacy.   

## 2020-06-12 DIAGNOSIS — I10 Essential (primary) hypertension: Secondary | ICD-10-CM | POA: Diagnosis not present

## 2020-06-12 DIAGNOSIS — E782 Mixed hyperlipidemia: Secondary | ICD-10-CM | POA: Diagnosis not present

## 2020-06-12 DIAGNOSIS — I700 Atherosclerosis of aorta: Secondary | ICD-10-CM | POA: Diagnosis not present

## 2020-06-29 ENCOUNTER — Ambulatory Visit
Admission: RE | Admit: 2020-06-29 | Discharge: 2020-06-29 | Disposition: A | Discharge status: Home / Self Care | Payer: Medicare Other | Source: Ambulatory Visit | Attending: Physical Medicine & Rehabilitation | Admitting: Physical Medicine & Rehabilitation

## 2020-06-29 ENCOUNTER — Other Ambulatory Visit: Payer: Self-pay

## 2020-06-29 DIAGNOSIS — M1711 Unilateral primary osteoarthritis, right knee: Secondary | ICD-10-CM | POA: Diagnosis not present

## 2020-06-29 DIAGNOSIS — M79609 Pain in unspecified limb: Secondary | ICD-10-CM

## 2020-06-29 DIAGNOSIS — M7121 Synovial cyst of popliteal space [Baker], right knee: Secondary | ICD-10-CM | POA: Diagnosis not present

## 2020-06-29 DIAGNOSIS — M7122 Synovial cyst of popliteal space [Baker], left knee: Secondary | ICD-10-CM | POA: Diagnosis not present

## 2020-06-29 DIAGNOSIS — M25462 Effusion, left knee: Secondary | ICD-10-CM | POA: Diagnosis not present

## 2020-06-29 DIAGNOSIS — M23341 Other meniscus derangements, anterior horn of lateral meniscus, right knee: Secondary | ICD-10-CM | POA: Diagnosis not present

## 2020-06-29 DIAGNOSIS — M170 Bilateral primary osteoarthritis of knee: Secondary | ICD-10-CM

## 2020-06-29 DIAGNOSIS — M1712 Unilateral primary osteoarthritis, left knee: Secondary | ICD-10-CM | POA: Diagnosis not present

## 2020-06-29 DIAGNOSIS — M25461 Effusion, right knee: Secondary | ICD-10-CM | POA: Diagnosis not present

## 2020-06-29 DIAGNOSIS — S83242A Other tear of medial meniscus, current injury, left knee, initial encounter: Secondary | ICD-10-CM | POA: Diagnosis not present

## 2020-06-29 IMAGING — MR MR KNEE*R* W/O CM
6 series · 40 of 40 positions shown · non-contrast
Comparison: None.

CLINICAL DATA: Chronic bilateral knee pain. No acute injury or
prior relevant surgery.

EXAM:
MRI OF THE RIGHT KNEE WITHOUT CONTRAST
TECHNIQUE: Multiplanar, multisequence MR imaging of the knee was performed. No
intravenous contrast was administered.

[Series 3: T2 fat-sat · axial · right · 4.0mm · 0.50mm/px · z∈[-25,+129]mm · 8 of 32 slices shown (1 of 3)]
[im 1/32]
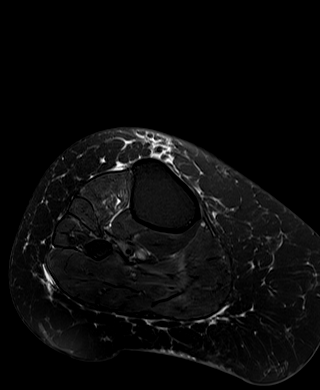
[im 5/32]
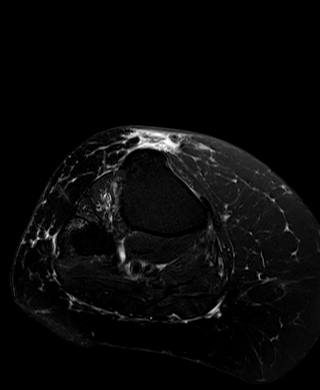
[im 9/32]
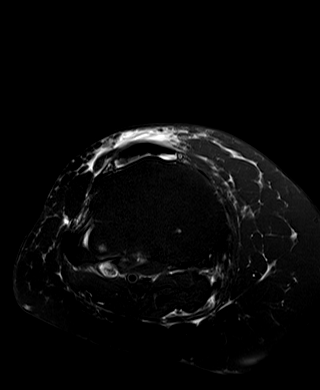
[im 14/32]
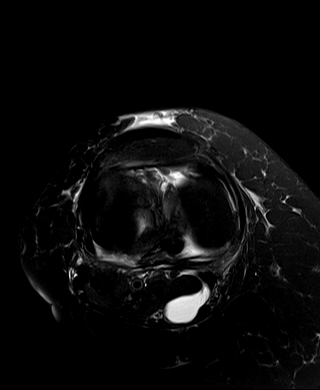
[im 18/32]
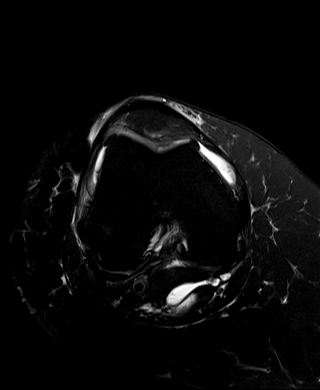
[im 23/32]
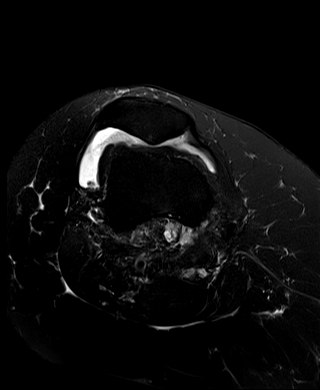
[im 27/32]
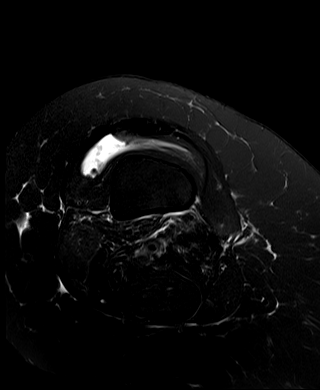
[im 32/32]
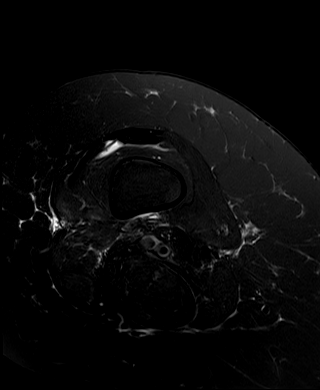

[Series 4: T1 · coronal · right · 4.0mm · 0.50mm/px · 6 of 26 slices shown]
[im 1/26]
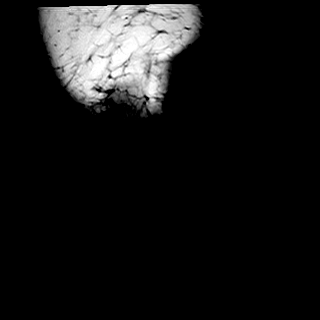
[im 6/26]
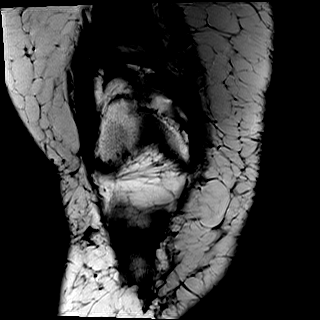
[im 11/26]
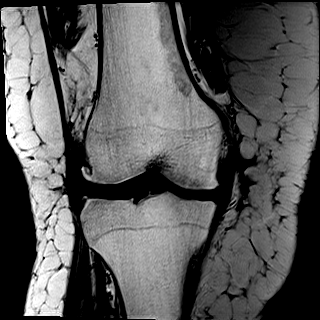
[im 16/26]
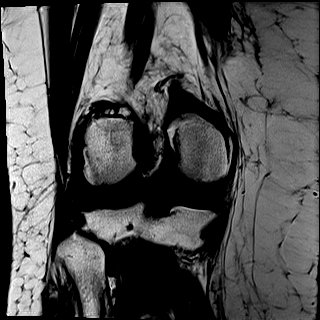
[im 21/26]
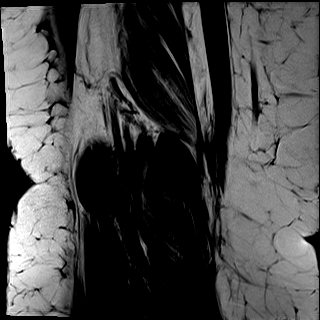
[im 26/26]
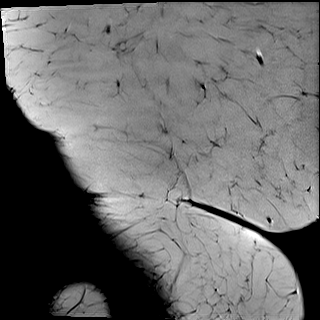

[Series 5: T2 fat-sat · coronal · right · 4.0mm · 0.50mm/px · 6 of 27 slices shown (2 of 3)]
[im 1/27]
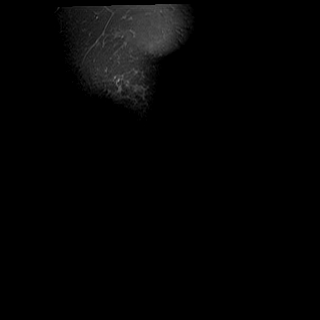
[im 6/27]
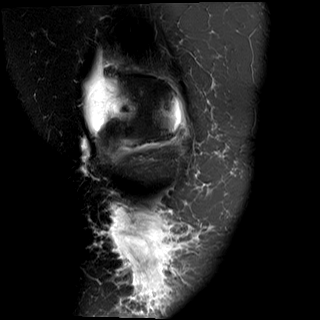
[im 11/27]
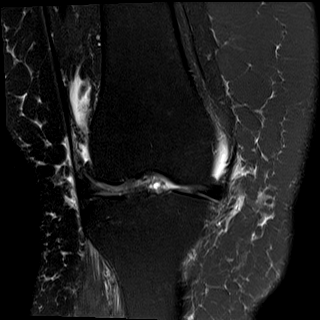
[im 16/27]
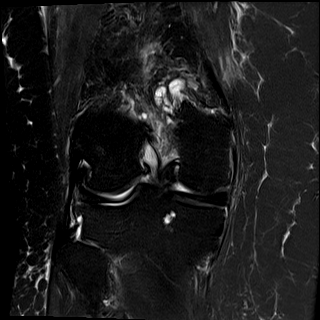
[im 21/27]
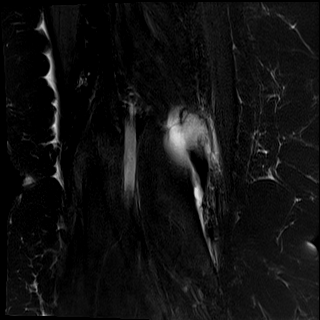
[im 27/27]
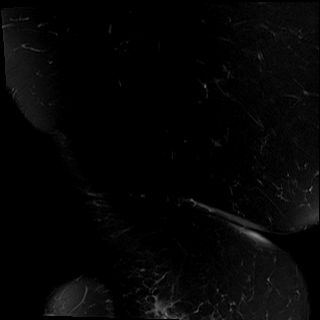

[Series 6: PD fat-sat · coronal · right · 3.0mm · 0.59mm/px · 6 of 27 slices shown (1 of 2)]
[im 1/27]
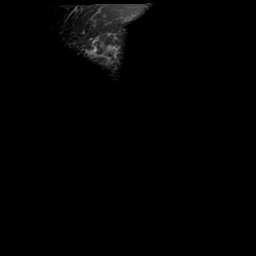
[im 6/27]
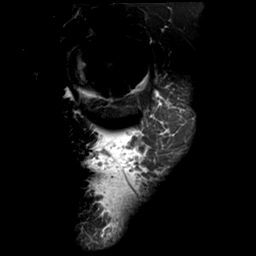
[im 11/27]
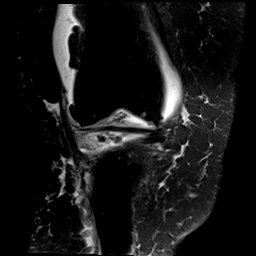
[im 16/27]
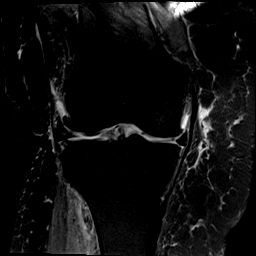
[im 21/27]
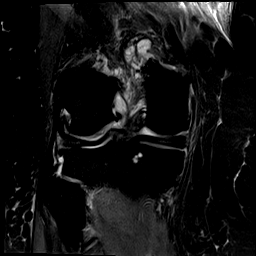
[im 27/27]
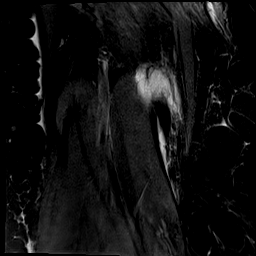

[Series 7: PD fat-sat · sagittal · right · 3.0mm · 0.50mm/px · 7 of 29 slices shown (2 of 2)]
[im 1/29]
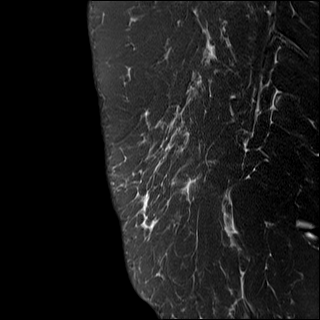
[im 5/29]
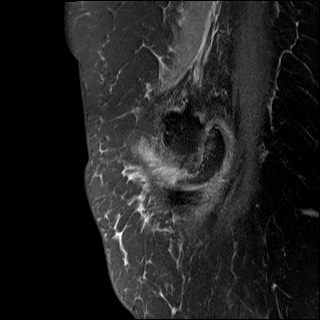
[im 10/29]
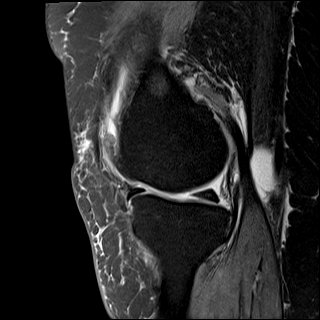
[im 15/29]
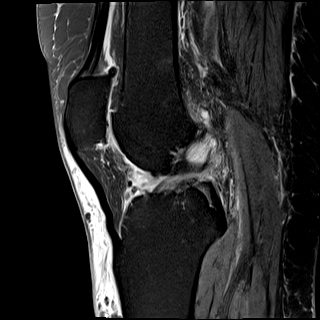
[im 19/29]
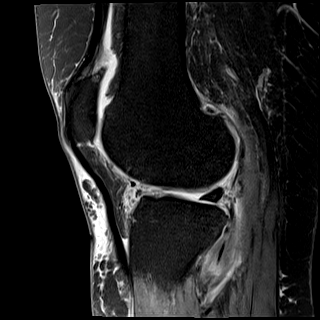
[im 24/29]
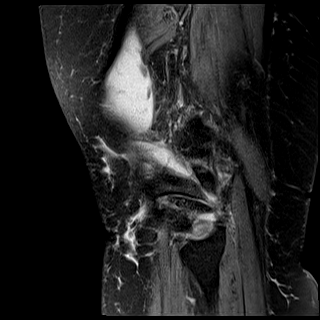
[im 29/29]
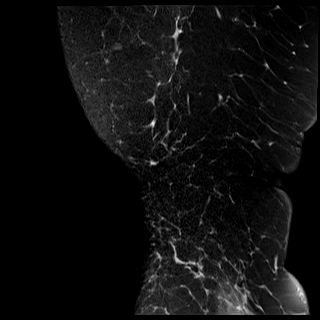

[Series 8: T2 fat-sat · sagittal · right · 3.0mm · 0.50mm/px · 7 of 29 slices shown (3 of 3)]
[im 1/29]
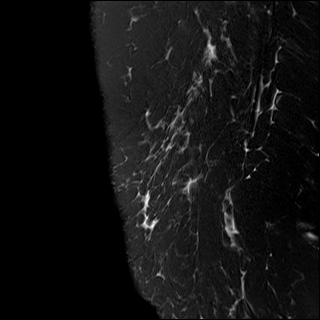
[im 5/29]
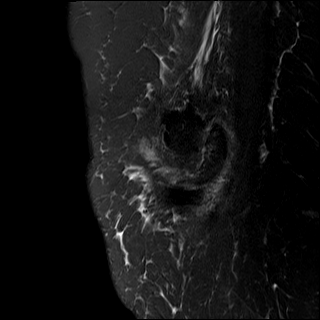
[im 10/29]
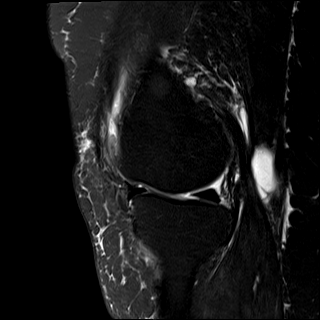
[im 15/29]
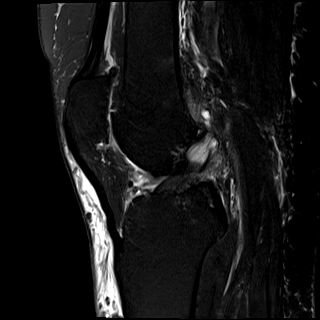
[im 19/29]
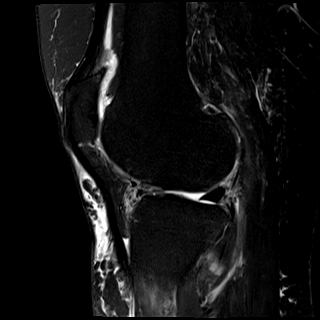
[im 24/29]
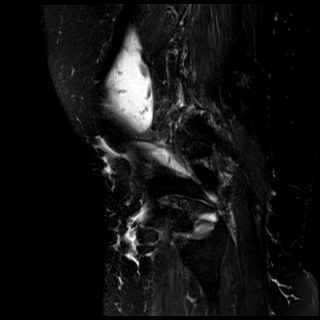
[im 29/29]
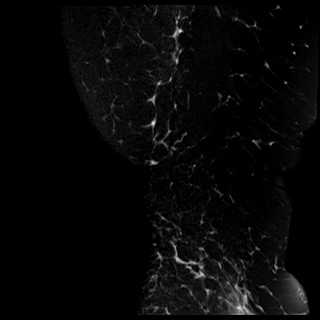

[40 of 40 positions shown; findings below may reference images not displayed]

FINDINGS: MENISCI

Medial meniscus: Mildly degenerated and minimally extruded
peripherally from the joint. The meniscal root is intact. No
meniscal tear or displaced meniscal fragment.

Lateral meniscus: Moderately degenerated and peripherally extruded
from the joint. The anterior horn appears macerated without
displaced meniscal fragment.

LIGAMENTS

Cruciates:  Intact.  Moderate ACL mucoid degeneration.

Collaterals:  Intact.

CARTILAGE

Patellofemoral: Moderate patellofemoral degenerative changes with
diffuse chondral thinning, osteophytes and mild subchondral cyst
formation.

Medial: Moderate chondral thinning, surface irregularity and
osteophyte formation.

Lateral: Moderate chondral thinning, surface irregularity and
osteophyte formation.

MISCELLANEOUS

Joint: Moderate-sized joint effusion. There are small loose bodies
within the popliteus hiatus. In addition, there are small loose
bodies anterior to the ACL insertion.

Popliteal Fossa: Moderate-sized Baker's cyst. As above, prominent
fluid in the popliteus hiatus with small intra-articular loose
bodies.

Extensor Mechanism:  Intact.

Bones:  No acute or significant extra-articular osseous findings.

Other: Prominent prepatellar subcutaneous edema.
IMPRESSION: 1. Moderate tricompartmental degenerative changes with joint
effusion and intra-articular loose bodies. Moderate-sized Baker's
cyst. No acute osseous findings.
2. Diffuse meniscal degeneration, especially within the anterior
horn of the lateral meniscus which appears macerated.
3. ACL mucoid degeneration.

## 2020-06-29 IMAGING — MR MR KNEE*L* W/O CM
6 series · 40 of 40 positions shown · non-contrast
Comparison: Radiographs [DATE] left lower extremity venous
Doppler [DATE].

CLINICAL DATA: Chronic bilateral knee pain. No acute injury or
prior relevant surgery.

EXAM:
MRI OF THE LEFT KNEE WITHOUT CONTRAST
TECHNIQUE: Multiplanar, multisequence MR imaging of the knee was performed. No
intravenous contrast was administered.

[Series 5: T2 fat-sat · axial · left · 4.0mm · 0.50mm/px · z∈[-85,+70]mm · 7 of 32 slices shown (1 of 3)]
[im 1/32]
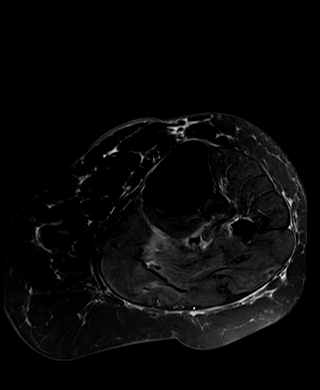
[im 6/32]
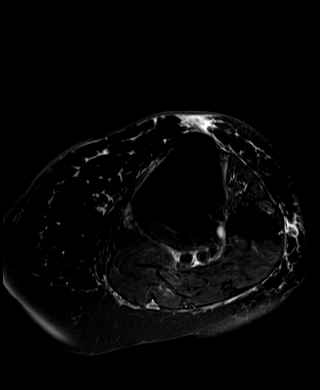
[im 11/32]
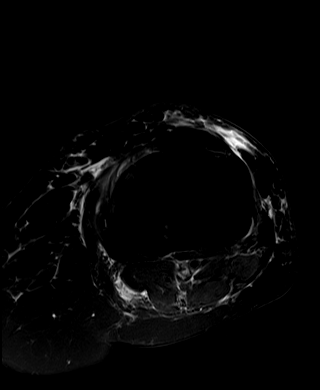
[im 16/32]
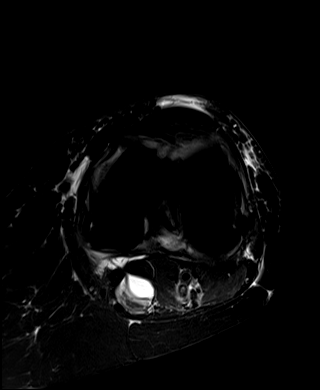
[im 21/32]
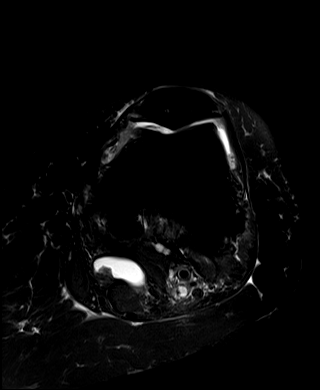
[im 26/32]
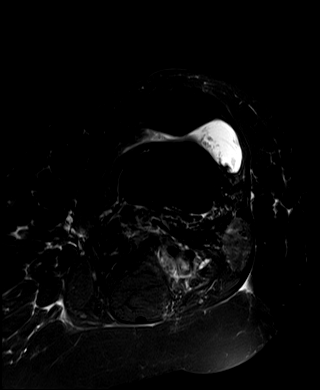
[im 32/32]
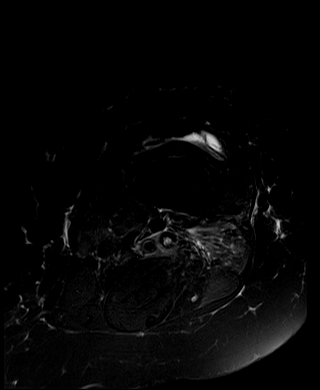

[Series 6: T1 · coronal · left · 4.0mm · 0.50mm/px · 7 of 30 slices shown]
[im 1/30]
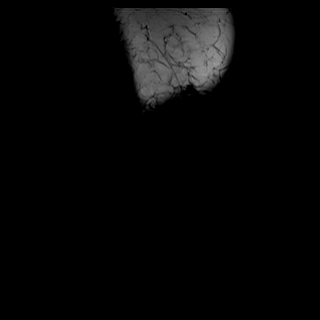
[im 5/30]
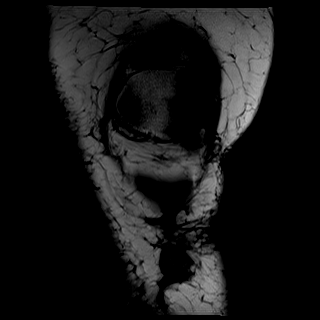
[im 10/30]
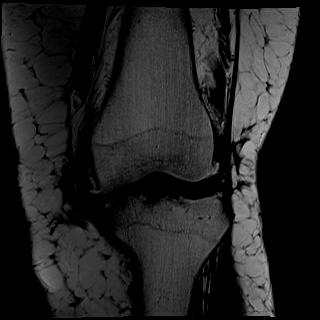
[im 15/30]
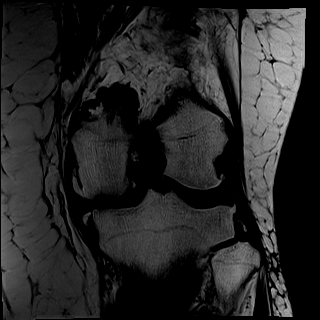
[im 20/30]
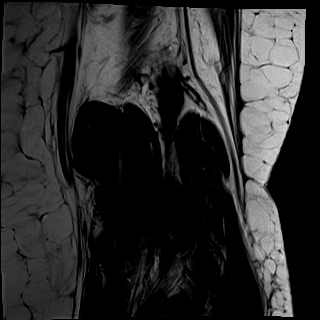
[im 25/30]
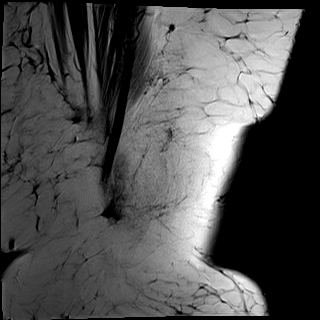
[im 30/30]
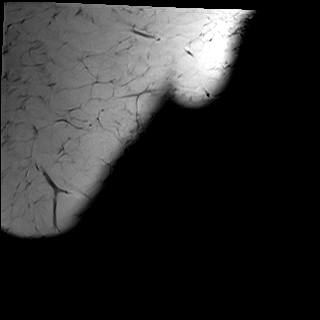

[Series 7: T2 fat-sat · coronal · left · 4.0mm · 0.50mm/px · 7 of 29 slices shown (2 of 3)]
[im 1/29]
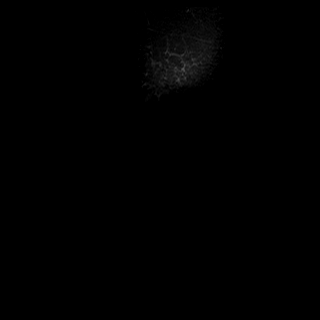
[im 5/29]
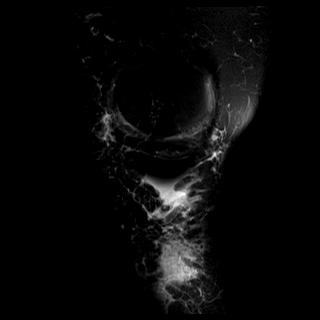
[im 10/29]
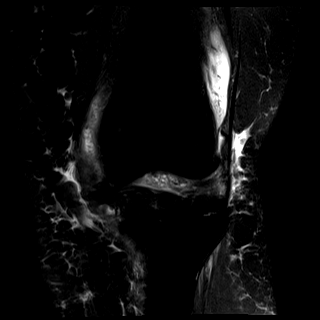
[im 15/29]
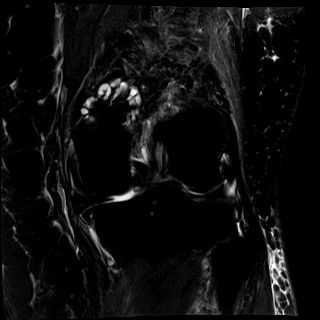
[im 19/29]
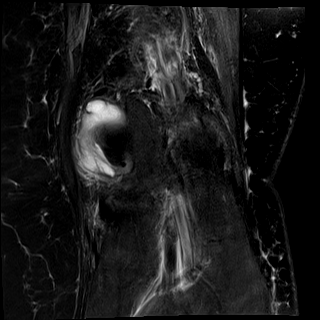
[im 24/29]
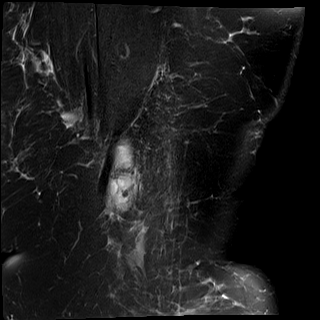
[im 29/29]
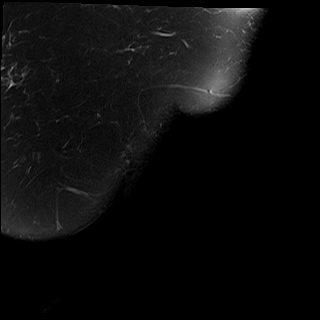

[Series 8: PD fat-sat · coronal · left · 3.0mm · 0.62mm/px · 6 of 28 slices shown (1 of 2)]
[im 1/28]
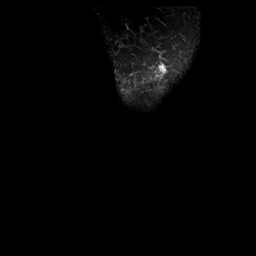
[im 6/28]
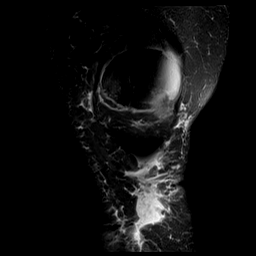
[im 11/28]
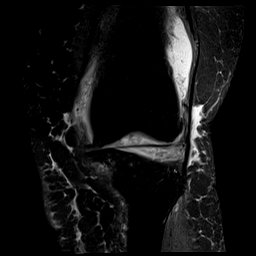
[im 17/28]
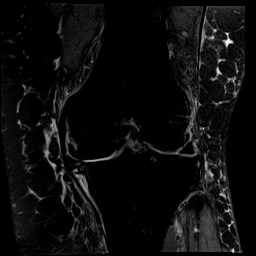
[im 22/28]
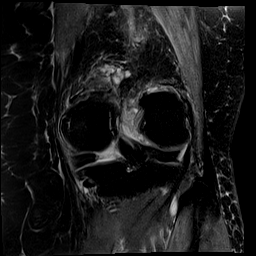
[im 28/28]
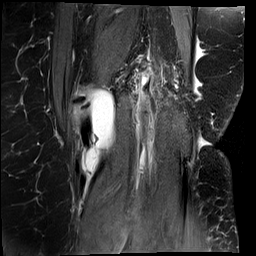

[Series 9: PD fat-sat · sagittal · left · 3.0mm · 0.50mm/px · 7 of 29 slices shown (2 of 2)]
[im 1/29]
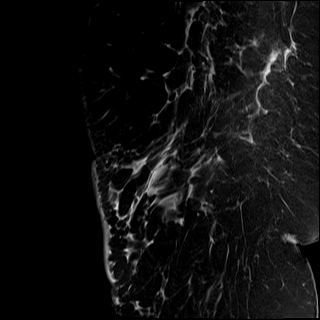
[im 5/29]
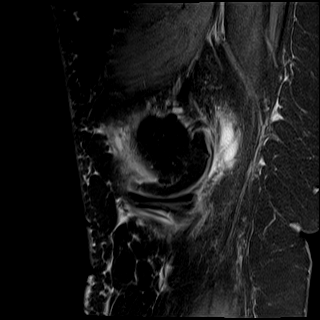
[im 10/29]
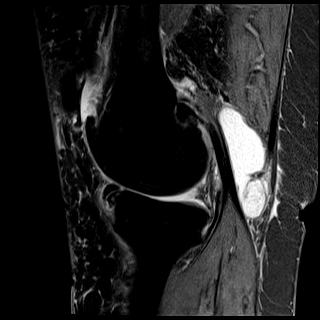
[im 15/29]
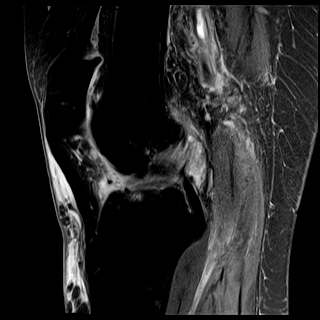
[im 19/29]
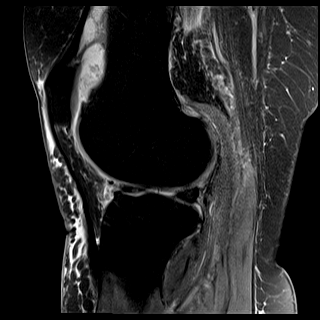
[im 24/29]
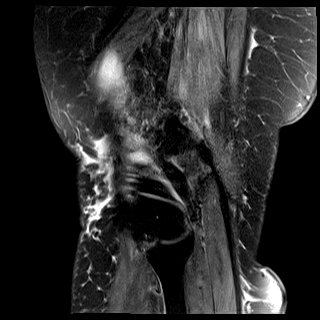
[im 29/29]
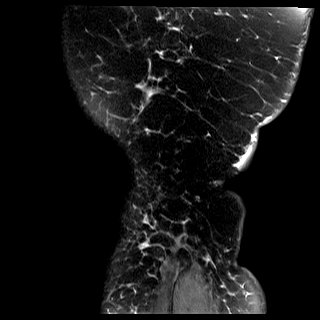

[Series 10: T2 fat-sat · sagittal · left · 3.0mm · 0.50mm/px · 6 of 28 slices shown (3 of 3)]
[im 1/28]
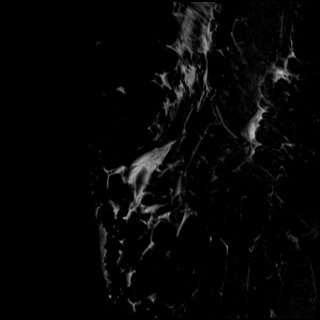
[im 6/28]
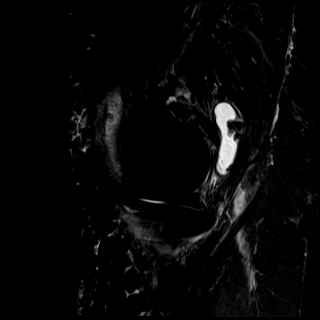
[im 11/28]
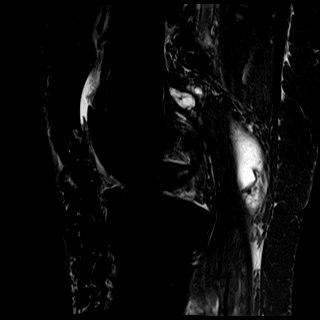
[im 17/28]
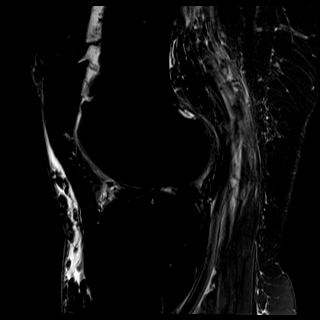
[im 22/28]
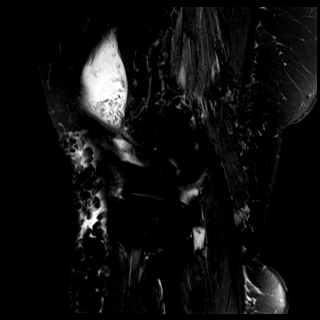
[im 28/28]
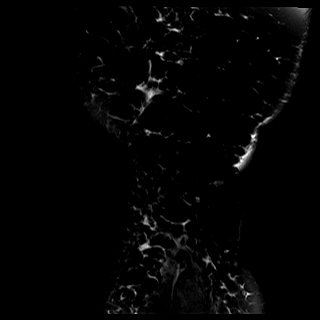

[40 of 40 positions shown; findings below may reference images not displayed]

FINDINGS: MENISCI

Medial meniscus: There is a large radial tear involving the root of
the posterior horn of the medial meniscus. There is resulting
moderate peripheral extrusion of the medial meniscus from the joint
space. There are central osteophytes of the medial femoral condyle
without evidence of centrally displaced meniscal fragment.

Lateral meniscus:  Mildly degenerated without tear.

LIGAMENTS

Cruciates:  Intact.  Mild ACL mucoid degeneration.

Collaterals: Intact. There is mild degenerative buckling of the MCL
related to the medial meniscal extrusion.

CARTILAGE

Patellofemoral: Moderate patellofemoral degenerative changes with
diffuse chondral thinning, osteophytes and mild subchondral cyst
formation.

Medial: Advanced medial compartment osteoarthritis with diffuse
chondral thinning and prominent osteophytes.

Lateral: Moderate chondral thinning, surface irregularity and
osteophyte formation.

MISCELLANEOUS

Joint: Moderate-sized joint effusion with synovial irregularity. No
discrete intra-articular loose bodies identified.

Popliteal Fossa: Moderate-sized Baker's cyst appears mildly complex
and contains probable small loose bodies. Heterogeneous signal
within the lumen of the popliteal vein which could relate to slow
flow or prior DVT. Mild surrounding soft tissue edema.

Extensor Mechanism:  Intact.

Bones:  No acute or significant extra-articular osseous findings.

Other: Mild prepatellar subcutaneous edema.
IMPRESSION: 1. Tricompartmental degenerative changes, most advanced in the
medial compartment. No acute osseous findings. Moderate-sized joint
effusion and Baker's cyst with small loose bodies in the cyst.
2. Large radial tear involving the root of the posterior horn of the
medial meniscus with resulting peripheral extrusion of the meniscus
from the joint.
3. Mild ACL mucoid degeneration.
4. Irregular signal within and surrounding the popliteal vein which
could relate to slow flow or prior DVT. This could be evaluated with
follow-up Doppler ultrasound if clinically warranted.

## 2020-07-01 ENCOUNTER — Other Ambulatory Visit: Payer: Self-pay

## 2020-07-01 ENCOUNTER — Encounter: Payer: Self-pay | Admitting: Physical Medicine & Rehabilitation

## 2020-07-01 ENCOUNTER — Encounter: Payer: Medicare Other | Attending: Physical Medicine & Rehabilitation | Admitting: Physical Medicine & Rehabilitation

## 2020-07-01 VITALS — BP 157/70 | HR 61 | Temp 98.6°F | Ht 65.5 in | Wt 227.8 lb

## 2020-07-01 DIAGNOSIS — M79609 Pain in unspecified limb: Secondary | ICD-10-CM | POA: Insufficient documentation

## 2020-07-01 DIAGNOSIS — M170 Bilateral primary osteoarthritis of knee: Secondary | ICD-10-CM | POA: Insufficient documentation

## 2020-07-01 MED ORDER — OXYCODONE HCL 10 MG PO TABS: 10.0000 mg | ORAL_TABLET | Freq: Two times a day (BID) | ORAL | 0 refills | Status: DC | PRN

## 2020-07-01 NOTE — Progress Notes (Signed)
PROCEDURE NOTE  DIAGNOSIS: OA bilateral knees  INTERVENTION:  Synvisc injections for bilateral knees     After informed consent and preparation of the skin with betadine and isopropyl alcohol, I injected 6cc Synsic One into the bilateral knees via anterolateral approach. Additionally, aspiration was performed prior to injection. The patient tolerated well, and no complications were encountered. Afterward the area was cleaned and dressed. Post- injection instructions were provided.    Made referral to [facility] to assess bilateral knees. MRI demonstrates significant meniscal injury and associated effusion/baker's cyst   Meredith Staggers, MD, [facility] 07/01/2020

## 2020-07-01 NOTE — Patient Instructions (Signed)
PLEASE FEEL FREE TO CALL OUR OFFICE WITH ANY PROBLEMS OR QUESTIONS ([phone])      

## 2020-07-12 DIAGNOSIS — I10 Essential (primary) hypertension: Secondary | ICD-10-CM | POA: Diagnosis not present

## 2020-07-12 DIAGNOSIS — I5030 Unspecified diastolic (congestive) heart failure: Secondary | ICD-10-CM | POA: Diagnosis not present

## 2020-07-13 DIAGNOSIS — M25561 Pain in right knee: Secondary | ICD-10-CM | POA: Diagnosis not present

## 2020-07-13 DIAGNOSIS — M25562 Pain in left knee: Secondary | ICD-10-CM | POA: Diagnosis not present

## 2020-07-22 DIAGNOSIS — E782 Mixed hyperlipidemia: Secondary | ICD-10-CM | POA: Diagnosis not present

## 2020-07-23 ENCOUNTER — Other Ambulatory Visit: Payer: Self-pay | Admitting: Physician Assistant

## 2020-08-03 DIAGNOSIS — M25561 Pain in right knee: Secondary | ICD-10-CM | POA: Diagnosis not present

## 2020-08-04 DIAGNOSIS — I10 Essential (primary) hypertension: Secondary | ICD-10-CM | POA: Diagnosis not present

## 2020-08-04 DIAGNOSIS — I5030 Unspecified diastolic (congestive) heart failure: Secondary | ICD-10-CM | POA: Diagnosis not present

## 2020-08-04 DIAGNOSIS — N183 Chronic kidney disease, stage 3 unspecified: Secondary | ICD-10-CM | POA: Diagnosis not present

## 2020-08-04 DIAGNOSIS — E782 Mixed hyperlipidemia: Secondary | ICD-10-CM | POA: Diagnosis not present

## 2020-08-12 DIAGNOSIS — E782 Mixed hyperlipidemia: Secondary | ICD-10-CM | POA: Diagnosis not present

## 2020-08-12 DIAGNOSIS — I10 Essential (primary) hypertension: Secondary | ICD-10-CM | POA: Diagnosis not present

## 2020-08-12 DIAGNOSIS — I5030 Unspecified diastolic (congestive) heart failure: Secondary | ICD-10-CM | POA: Diagnosis not present

## 2020-08-28 ENCOUNTER — Other Ambulatory Visit: Payer: Self-pay | Admitting: Physician Assistant

## 2020-09-08 DIAGNOSIS — M170 Bilateral primary osteoarthritis of knee: Secondary | ICD-10-CM | POA: Diagnosis not present

## 2020-09-08 DIAGNOSIS — M2550 Pain in unspecified joint: Secondary | ICD-10-CM | POA: Diagnosis not present

## 2020-09-11 DIAGNOSIS — I10 Essential (primary) hypertension: Secondary | ICD-10-CM | POA: Diagnosis not present

## 2020-09-11 DIAGNOSIS — I5030 Unspecified diastolic (congestive) heart failure: Secondary | ICD-10-CM | POA: Diagnosis not present

## 2020-09-11 DIAGNOSIS — E782 Mixed hyperlipidemia: Secondary | ICD-10-CM | POA: Diagnosis not present

## 2020-09-23 ENCOUNTER — Ambulatory Visit: Payer: Medicare Other | Admitting: Physical Medicine & Rehabilitation

## 2020-09-30 ENCOUNTER — Encounter: Payer: Medicare Other | Admitting: Physical Medicine & Rehabilitation

## 2020-10-06 ENCOUNTER — Other Ambulatory Visit: Payer: Self-pay | Admitting: Physician Assistant

## 2020-10-12 DIAGNOSIS — E782 Mixed hyperlipidemia: Secondary | ICD-10-CM | POA: Diagnosis not present

## 2020-10-12 DIAGNOSIS — I5030 Unspecified diastolic (congestive) heart failure: Secondary | ICD-10-CM | POA: Diagnosis not present

## 2020-10-12 DIAGNOSIS — I10 Essential (primary) hypertension: Secondary | ICD-10-CM | POA: Diagnosis not present

## 2020-10-14 ENCOUNTER — Ambulatory Visit: Payer: Medicare Other | Admitting: Physical Medicine & Rehabilitation

## 2020-10-21 ENCOUNTER — Other Ambulatory Visit: Payer: Self-pay | Admitting: Hematology and Oncology

## 2020-10-21 DIAGNOSIS — Z09 Encounter for follow-up examination after completed treatment for conditions other than malignant neoplasm: Secondary | ICD-10-CM

## 2020-10-22 DIAGNOSIS — N182 Chronic kidney disease, stage 2 (mild): Secondary | ICD-10-CM | POA: Diagnosis not present

## 2020-10-22 DIAGNOSIS — M79604 Pain in right leg: Secondary | ICD-10-CM | POA: Diagnosis not present

## 2020-10-22 DIAGNOSIS — Z0000 Encounter for general adult medical examination without abnormal findings: Secondary | ICD-10-CM | POA: Diagnosis not present

## 2020-10-22 DIAGNOSIS — I129 Hypertensive chronic kidney disease with stage 1 through stage 4 chronic kidney disease, or unspecified chronic kidney disease: Secondary | ICD-10-CM | POA: Diagnosis not present

## 2020-10-22 DIAGNOSIS — Z139 Encounter for screening, unspecified: Secondary | ICD-10-CM | POA: Diagnosis not present

## 2020-10-22 DIAGNOSIS — Z136 Encounter for screening for cardiovascular disorders: Secondary | ICD-10-CM | POA: Diagnosis not present

## 2020-10-23 DIAGNOSIS — M79604 Pain in right leg: Secondary | ICD-10-CM | POA: Diagnosis not present

## 2020-10-23 DIAGNOSIS — I82412 Acute embolism and thrombosis of left femoral vein: Secondary | ICD-10-CM | POA: Diagnosis not present

## 2020-10-29 DIAGNOSIS — N1831 Chronic kidney disease, stage 3a: Secondary | ICD-10-CM | POA: Diagnosis not present

## 2020-10-29 DIAGNOSIS — Z6821 Body mass index (BMI) 21.0-21.9, adult: Secondary | ICD-10-CM | POA: Diagnosis not present

## 2020-10-29 DIAGNOSIS — I82402 Acute embolism and thrombosis of unspecified deep veins of left lower extremity: Secondary | ICD-10-CM | POA: Diagnosis not present

## 2020-10-29 DIAGNOSIS — M79604 Pain in right leg: Secondary | ICD-10-CM | POA: Diagnosis not present

## 2020-11-02 ENCOUNTER — Ambulatory Visit: Payer: Medicare Other | Admitting: Cardiology

## 2020-11-12 DIAGNOSIS — I10 Essential (primary) hypertension: Secondary | ICD-10-CM | POA: Diagnosis not present

## 2020-11-12 DIAGNOSIS — I5030 Unspecified diastolic (congestive) heart failure: Secondary | ICD-10-CM | POA: Diagnosis not present

## 2020-11-12 DIAGNOSIS — E782 Mixed hyperlipidemia: Secondary | ICD-10-CM | POA: Diagnosis not present

## 2020-11-20 ENCOUNTER — Other Ambulatory Visit: Payer: Self-pay | Admitting: Hematology and Oncology

## 2020-11-20 DIAGNOSIS — Z853 Personal history of malignant neoplasm of breast: Secondary | ICD-10-CM

## 2020-11-24 DIAGNOSIS — E782 Mixed hyperlipidemia: Secondary | ICD-10-CM | POA: Diagnosis not present

## 2020-11-25 ENCOUNTER — Other Ambulatory Visit: Payer: Self-pay

## 2020-11-25 ENCOUNTER — Ambulatory Visit
Admission: RE | Admit: 2020-11-25 | Discharge: 2020-11-25 | Disposition: A | Discharge status: Home / Self Care | Payer: Medicare Other | Source: Ambulatory Visit | Attending: Hematology and Oncology | Admitting: Hematology and Oncology

## 2020-11-25 DIAGNOSIS — R922 Inconclusive mammogram: Secondary | ICD-10-CM | POA: Diagnosis not present

## 2020-11-25 DIAGNOSIS — Z853 Personal history of malignant neoplasm of breast: Secondary | ICD-10-CM

## 2020-11-25 IMAGING — MG DIGITAL DIAGNOSTIC BILAT W/ TOMO W/ CAD
8 series · 8 of 24 positions shown · non-contrast
Comparison: Previous exam(s).

CLINICAL DATA: Follow-up intermediate grade ductal carcinoma in
situ in the upper outer left breast marked with a coil shaped biopsy
marker clip and diagnosed on stereotactic guided core needle biopsy
on [DATE]. The patient is currently taking tamoxifen and has not
had surgery.

EXAM:
DIGITAL DIAGNOSTIC BILATERAL MAMMOGRAM WITH TOMOSYNTHESIS AND CAD
TECHNIQUE: Bilateral digital diagnostic mammography and breast tomosynthesis
was performed. The images were evaluated with computer-aided
detection.

[L MLO synth-2D]
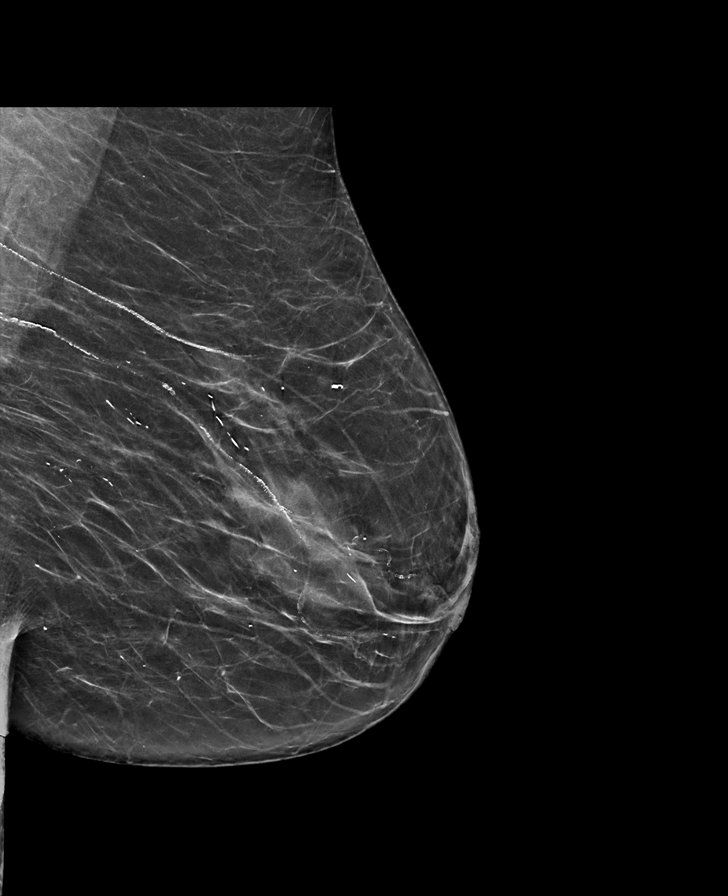

[R MLO synth-2D]
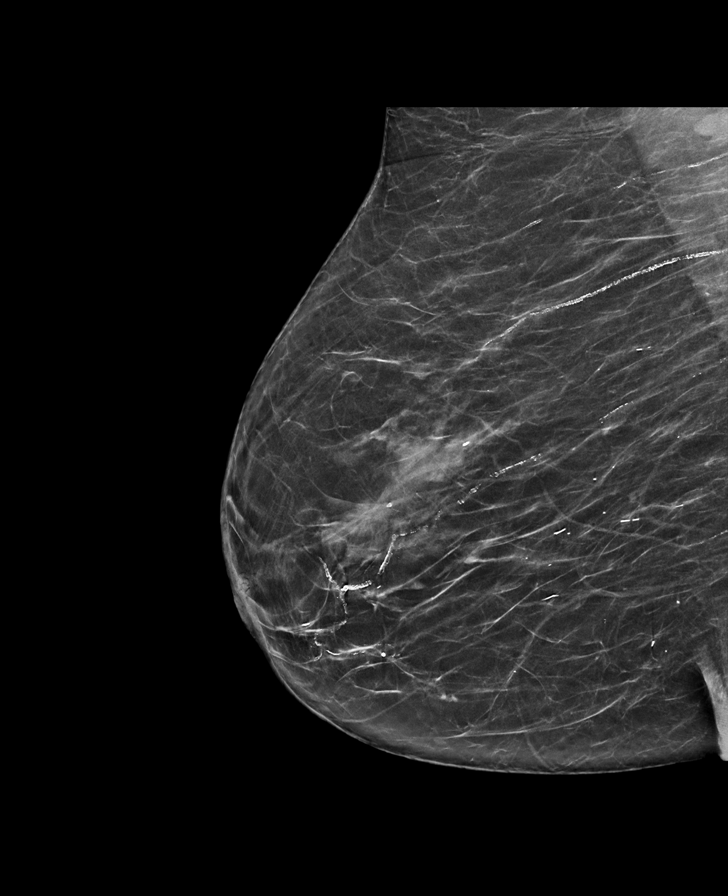

[L CC synth-2D]
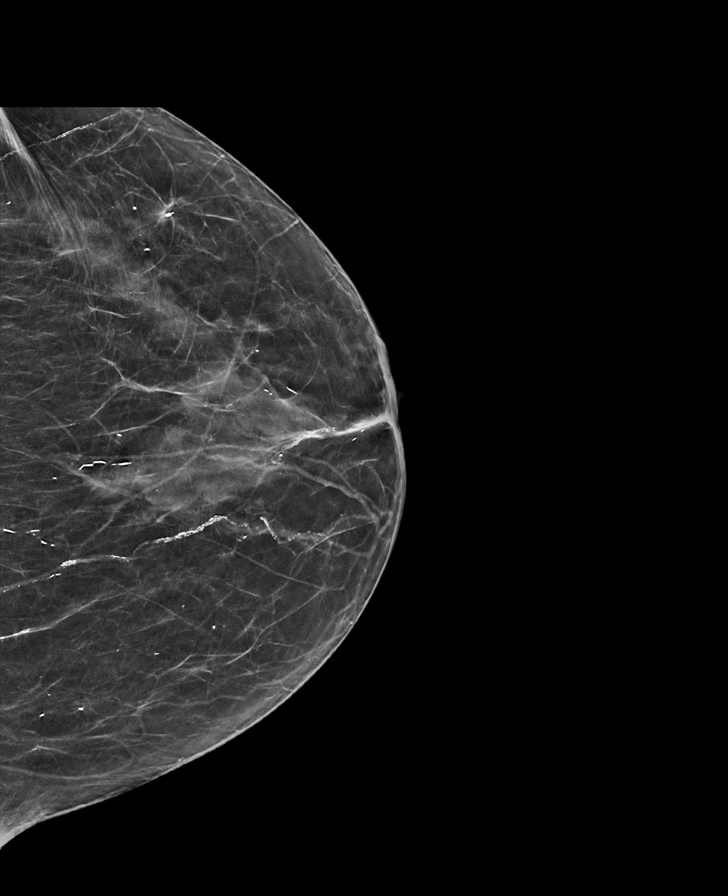

[R CC synth-2D]
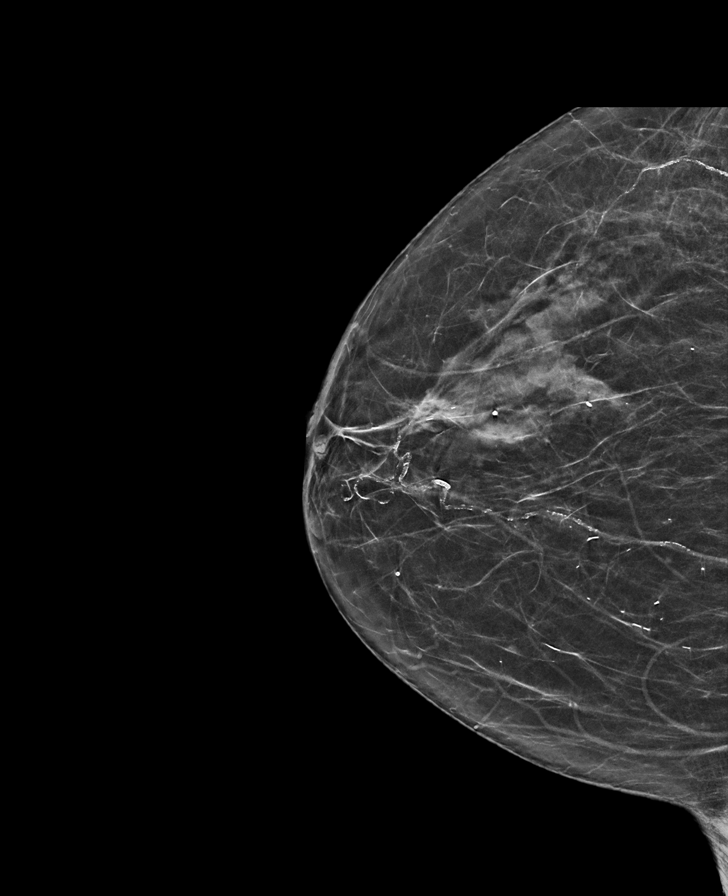

[R CC tomo · tomo slice 31/60.0]
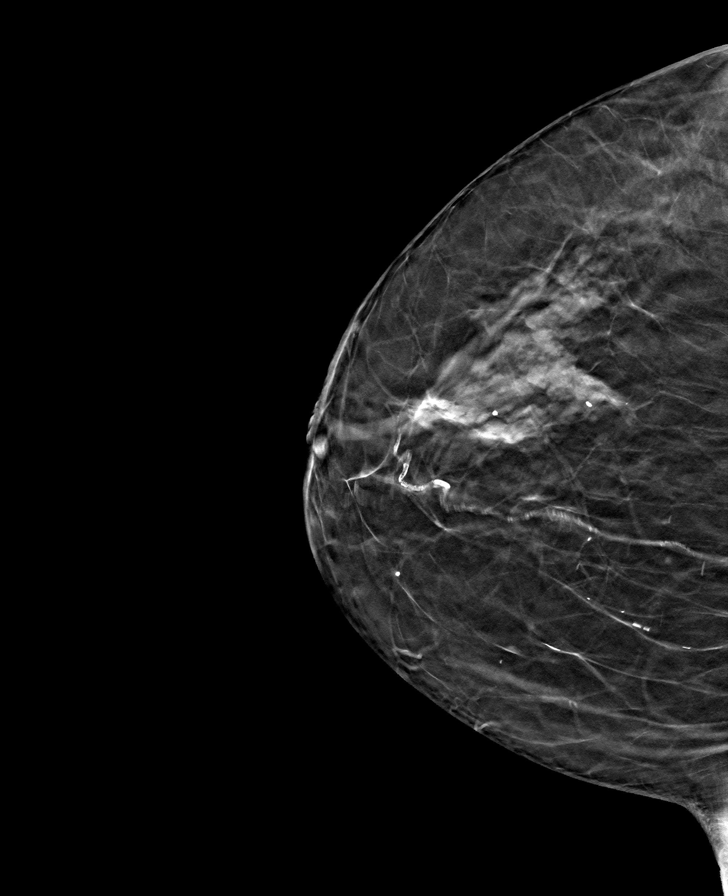

[R MLO tomo · tomo slice 33/66.0]
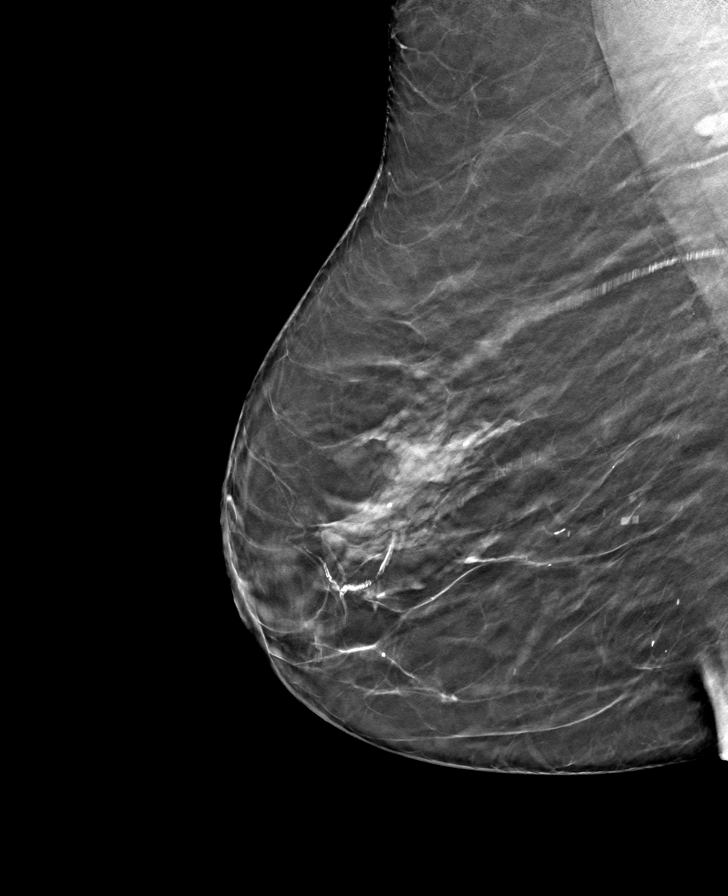

[L MLO tomo · tomo slice 33/66.0]
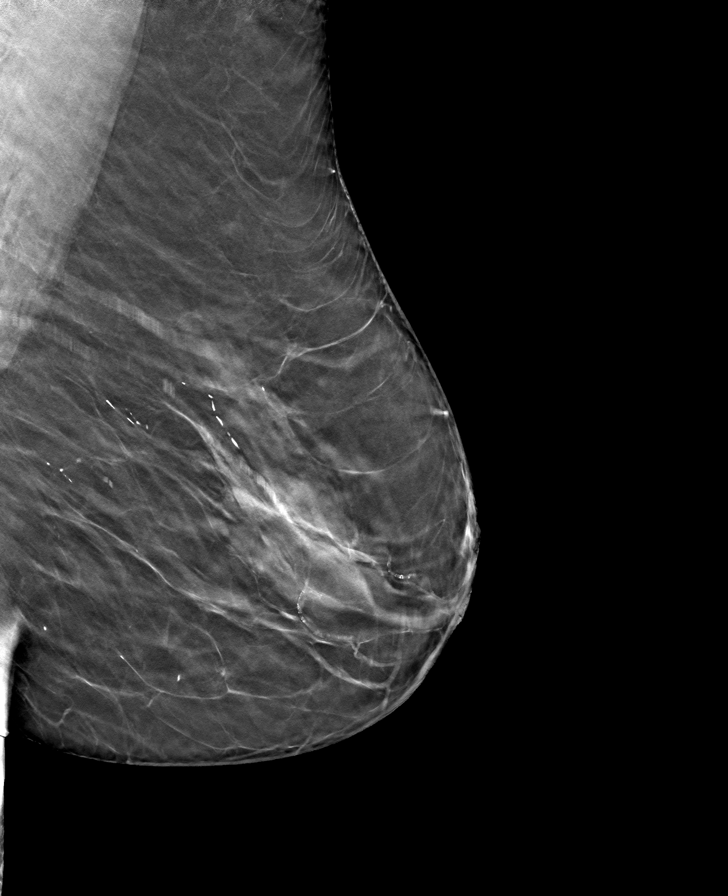

[L CC tomo · tomo slice 29/58.0]
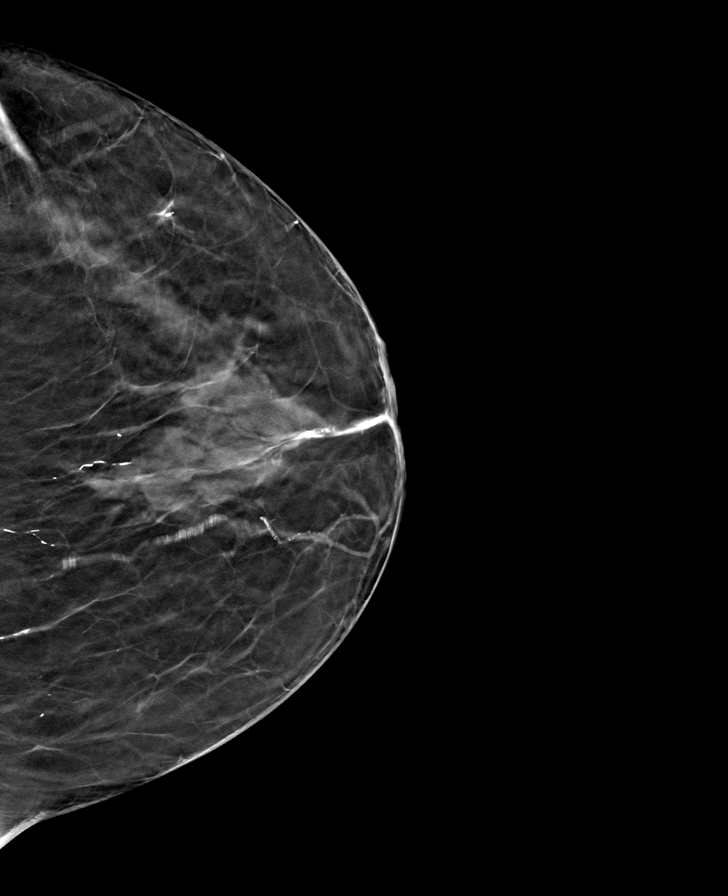

[8 of 24 positions shown; findings below may reference images not displayed]

ACR Breast Density Category b: There are scattered areas of
fibroglandular density.
FINDINGS: Stable post biopsy changes at the location of the coil shaped biopsy
marker clip in the upper-outer quadrant of the left breast. No
interval mass or other findings suspicious for malignancy in either
breast.
IMPRESSION: Stable post biopsy changes in the upper outer left breast with no
interval findings suspicious malignancy elsewhere in either breast.

RECOMMENDATION:
Left diagnostic mammogram in 6 months if the patient does not have
an interim lumpectomy.

I have discussed the findings and recommendations with the patient.
If applicable, a reminder letter will be sent to the patient
regarding the next appointment.

BI-RADS CATEGORY  6: Known biopsy-proven malignancy.

## 2020-12-02 ENCOUNTER — Other Ambulatory Visit: Payer: Self-pay | Admitting: Physician Assistant

## 2020-12-02 ENCOUNTER — Ambulatory Visit: Payer: Medicare Other | Admitting: Hematology and Oncology

## 2020-12-05 ENCOUNTER — Other Ambulatory Visit: Payer: Self-pay | Admitting: Hematology and Oncology

## 2020-12-08 ENCOUNTER — Ambulatory Visit: Payer: Medicare Other | Admitting: Hematology and Oncology

## 2020-12-08 ENCOUNTER — Other Ambulatory Visit: Payer: Self-pay | Admitting: Cardiology

## 2020-12-08 DIAGNOSIS — I129 Hypertensive chronic kidney disease with stage 1 through stage 4 chronic kidney disease, or unspecified chronic kidney disease: Secondary | ICD-10-CM | POA: Diagnosis not present

## 2020-12-08 DIAGNOSIS — N182 Chronic kidney disease, stage 2 (mild): Secondary | ICD-10-CM | POA: Diagnosis not present

## 2020-12-08 DIAGNOSIS — Z23 Encounter for immunization: Secondary | ICD-10-CM | POA: Diagnosis not present

## 2020-12-08 DIAGNOSIS — E782 Mixed hyperlipidemia: Secondary | ICD-10-CM | POA: Diagnosis not present

## 2020-12-08 NOTE — Assessment & Plan Note (Deleted)
10/23/2017:Screening detected 5 mm indeterminate mass left breast upper outer quadrant biopsy revealed intermediate grade DCIS ER 100%, PR 100%, Tis N0 stage 0  Treatment plan: Active surveillance with tamoxifen and mammograms every 6 months. Patient decided not to undergo surgery. Current treatment: Tamoxifen 20 mg daily started 10/31/2017  Tamoxifen toxicities:Denies any hot flashes or myalgias.  Breast cancer surveillance: 1.Mammogram 11/25/2020: No evidence of malignancy breast density category B  2.Breast exam 12/08/2020: Benign  Her husband passed away last year. Return to clinic in1 yearforfollow-up after another bilateral mammograms.

## 2020-12-09 ENCOUNTER — Encounter: Payer: Medicare Other | Attending: Physical Medicine & Rehabilitation | Admitting: Physical Medicine & Rehabilitation

## 2020-12-09 DIAGNOSIS — M25561 Pain in right knee: Secondary | ICD-10-CM | POA: Diagnosis not present

## 2020-12-09 DIAGNOSIS — M25562 Pain in left knee: Secondary | ICD-10-CM | POA: Diagnosis not present

## 2020-12-12 DIAGNOSIS — I10 Essential (primary) hypertension: Secondary | ICD-10-CM | POA: Diagnosis not present

## 2020-12-12 DIAGNOSIS — I5030 Unspecified diastolic (congestive) heart failure: Secondary | ICD-10-CM | POA: Diagnosis not present

## 2021-01-11 DIAGNOSIS — H5203 Hypermetropia, bilateral: Secondary | ICD-10-CM | POA: Diagnosis not present

## 2021-01-12 DIAGNOSIS — I5030 Unspecified diastolic (congestive) heart failure: Secondary | ICD-10-CM | POA: Diagnosis not present

## 2021-01-12 DIAGNOSIS — I10 Essential (primary) hypertension: Secondary | ICD-10-CM | POA: Diagnosis not present

## 2021-01-14 ENCOUNTER — Ambulatory Visit: Payer: Medicare Other | Admitting: Cardiology

## 2021-01-28 DIAGNOSIS — R059 Cough, unspecified: Secondary | ICD-10-CM | POA: Diagnosis not present

## 2021-01-28 DIAGNOSIS — J020 Streptococcal pharyngitis: Secondary | ICD-10-CM | POA: Diagnosis not present

## 2021-02-11 DIAGNOSIS — I10 Essential (primary) hypertension: Secondary | ICD-10-CM | POA: Diagnosis not present

## 2021-02-11 DIAGNOSIS — I5030 Unspecified diastolic (congestive) heart failure: Secondary | ICD-10-CM | POA: Diagnosis not present

## 2021-02-17 DIAGNOSIS — R059 Cough, unspecified: Secondary | ICD-10-CM | POA: Diagnosis not present

## 2021-02-17 DIAGNOSIS — J020 Streptococcal pharyngitis: Secondary | ICD-10-CM | POA: Diagnosis not present

## 2021-02-17 DIAGNOSIS — Z20822 Contact with and (suspected) exposure to covid-19: Secondary | ICD-10-CM | POA: Diagnosis not present

## 2021-03-08 ENCOUNTER — Other Ambulatory Visit: Payer: Self-pay | Admitting: Cardiology

## 2021-03-10 DIAGNOSIS — M25551 Pain in right hip: Secondary | ICD-10-CM | POA: Diagnosis not present

## 2021-03-10 DIAGNOSIS — M25552 Pain in left hip: Secondary | ICD-10-CM | POA: Diagnosis not present

## 2021-03-14 DIAGNOSIS — I10 Essential (primary) hypertension: Secondary | ICD-10-CM | POA: Diagnosis not present

## 2021-03-14 DIAGNOSIS — I5030 Unspecified diastolic (congestive) heart failure: Secondary | ICD-10-CM | POA: Diagnosis not present

## 2021-03-24 DIAGNOSIS — M25561 Pain in right knee: Secondary | ICD-10-CM | POA: Diagnosis not present

## 2021-03-24 DIAGNOSIS — M25562 Pain in left knee: Secondary | ICD-10-CM | POA: Diagnosis not present

## 2021-04-02 ENCOUNTER — Ambulatory Visit: Payer: Medicare Other | Admitting: Cardiology

## 2021-04-02 DIAGNOSIS — M25562 Pain in left knee: Secondary | ICD-10-CM | POA: Diagnosis not present

## 2021-04-02 DIAGNOSIS — M25561 Pain in right knee: Secondary | ICD-10-CM | POA: Diagnosis not present

## 2021-04-02 DIAGNOSIS — R531 Weakness: Secondary | ICD-10-CM | POA: Diagnosis not present

## 2021-04-02 DIAGNOSIS — M79669 Pain in unspecified lower leg: Secondary | ICD-10-CM | POA: Diagnosis not present

## 2021-04-02 DIAGNOSIS — R2681 Unsteadiness on feet: Secondary | ICD-10-CM | POA: Diagnosis not present

## 2021-04-14 DIAGNOSIS — I10 Essential (primary) hypertension: Secondary | ICD-10-CM | POA: Diagnosis not present

## 2021-04-14 DIAGNOSIS — I5030 Unspecified diastolic (congestive) heart failure: Secondary | ICD-10-CM | POA: Diagnosis not present

## 2021-04-20 DIAGNOSIS — R2681 Unsteadiness on feet: Secondary | ICD-10-CM | POA: Diagnosis not present

## 2021-04-20 DIAGNOSIS — M79669 Pain in unspecified lower leg: Secondary | ICD-10-CM | POA: Diagnosis not present

## 2021-04-20 DIAGNOSIS — R531 Weakness: Secondary | ICD-10-CM | POA: Diagnosis not present

## 2021-04-20 DIAGNOSIS — M25561 Pain in right knee: Secondary | ICD-10-CM | POA: Diagnosis not present

## 2021-04-20 DIAGNOSIS — M25562 Pain in left knee: Secondary | ICD-10-CM | POA: Diagnosis not present

## 2021-05-05 ENCOUNTER — Other Ambulatory Visit: Payer: Self-pay | Admitting: Hematology and Oncology

## 2021-05-05 DIAGNOSIS — Z09 Encounter for follow-up examination after completed treatment for conditions other than malignant neoplasm: Secondary | ICD-10-CM

## 2021-05-06 DIAGNOSIS — R531 Weakness: Secondary | ICD-10-CM | POA: Diagnosis not present

## 2021-05-06 DIAGNOSIS — M25562 Pain in left knee: Secondary | ICD-10-CM | POA: Diagnosis not present

## 2021-05-06 DIAGNOSIS — M79669 Pain in unspecified lower leg: Secondary | ICD-10-CM | POA: Diagnosis not present

## 2021-05-06 DIAGNOSIS — R2681 Unsteadiness on feet: Secondary | ICD-10-CM | POA: Diagnosis not present

## 2021-05-06 DIAGNOSIS — M25561 Pain in right knee: Secondary | ICD-10-CM | POA: Diagnosis not present

## 2021-05-12 DIAGNOSIS — I5030 Unspecified diastolic (congestive) heart failure: Secondary | ICD-10-CM | POA: Diagnosis not present

## 2021-05-12 DIAGNOSIS — I10 Essential (primary) hypertension: Secondary | ICD-10-CM | POA: Diagnosis not present

## 2021-05-20 ENCOUNTER — Other Ambulatory Visit: Payer: Self-pay

## 2021-05-20 MED ORDER — FUROSEMIDE 20 MG PO TABS: ORAL_TABLET | ORAL | 3 refills | Status: DC

## 2021-05-20 MED ORDER — LISINOPRIL 10 MG PO TABS: ORAL_TABLET | ORAL | 3 refills | Status: DC

## 2021-05-20 MED ORDER — AMLODIPINE BESYLATE 5 MG PO TABS: ORAL_TABLET | ORAL | 3 refills | Status: DC

## 2021-05-26 ENCOUNTER — Ambulatory Visit
Admission: RE | Admit: 2021-05-26 | Discharge: 2021-05-26 | Disposition: A | Discharge status: Home / Self Care | Payer: Medicare Other | Source: Ambulatory Visit | Attending: Hematology and Oncology | Admitting: Hematology and Oncology

## 2021-05-26 DIAGNOSIS — Z09 Encounter for follow-up examination after completed treatment for conditions other than malignant neoplasm: Secondary | ICD-10-CM

## 2021-05-26 DIAGNOSIS — M791 Myalgia, unspecified site: Secondary | ICD-10-CM | POA: Diagnosis not present

## 2021-05-26 DIAGNOSIS — M2550 Pain in unspecified joint: Secondary | ICD-10-CM | POA: Diagnosis not present

## 2021-05-26 DIAGNOSIS — M79641 Pain in right hand: Secondary | ICD-10-CM | POA: Diagnosis not present

## 2021-05-26 DIAGNOSIS — R922 Inconclusive mammogram: Secondary | ICD-10-CM | POA: Diagnosis not present

## 2021-05-26 DIAGNOSIS — M79642 Pain in left hand: Secondary | ICD-10-CM | POA: Diagnosis not present

## 2021-05-26 IMAGING — MG MM DIGITAL DIAGNOSTIC UNILAT*L* W/ TOMO W/ CAD
4 series · 4 of 12 positions shown · non-contrast
Comparison: Previous exam(s).

CLINICAL DATA: 75-year-old female presenting for surveillance of
the left breast. Patient a left breast biopsy in [G8] demonstrating
DCIS. The patient has opted not to undergo surgery.

EXAM:
DIGITAL DIAGNOSTIC UNILATERAL LEFT MAMMOGRAM WITH TOMOSYNTHESIS AND
CAD
TECHNIQUE: Left digital diagnostic mammography and breast tomosynthesis was
performed. The images were evaluated with computer-aided detection.

[L CC synth-2D]
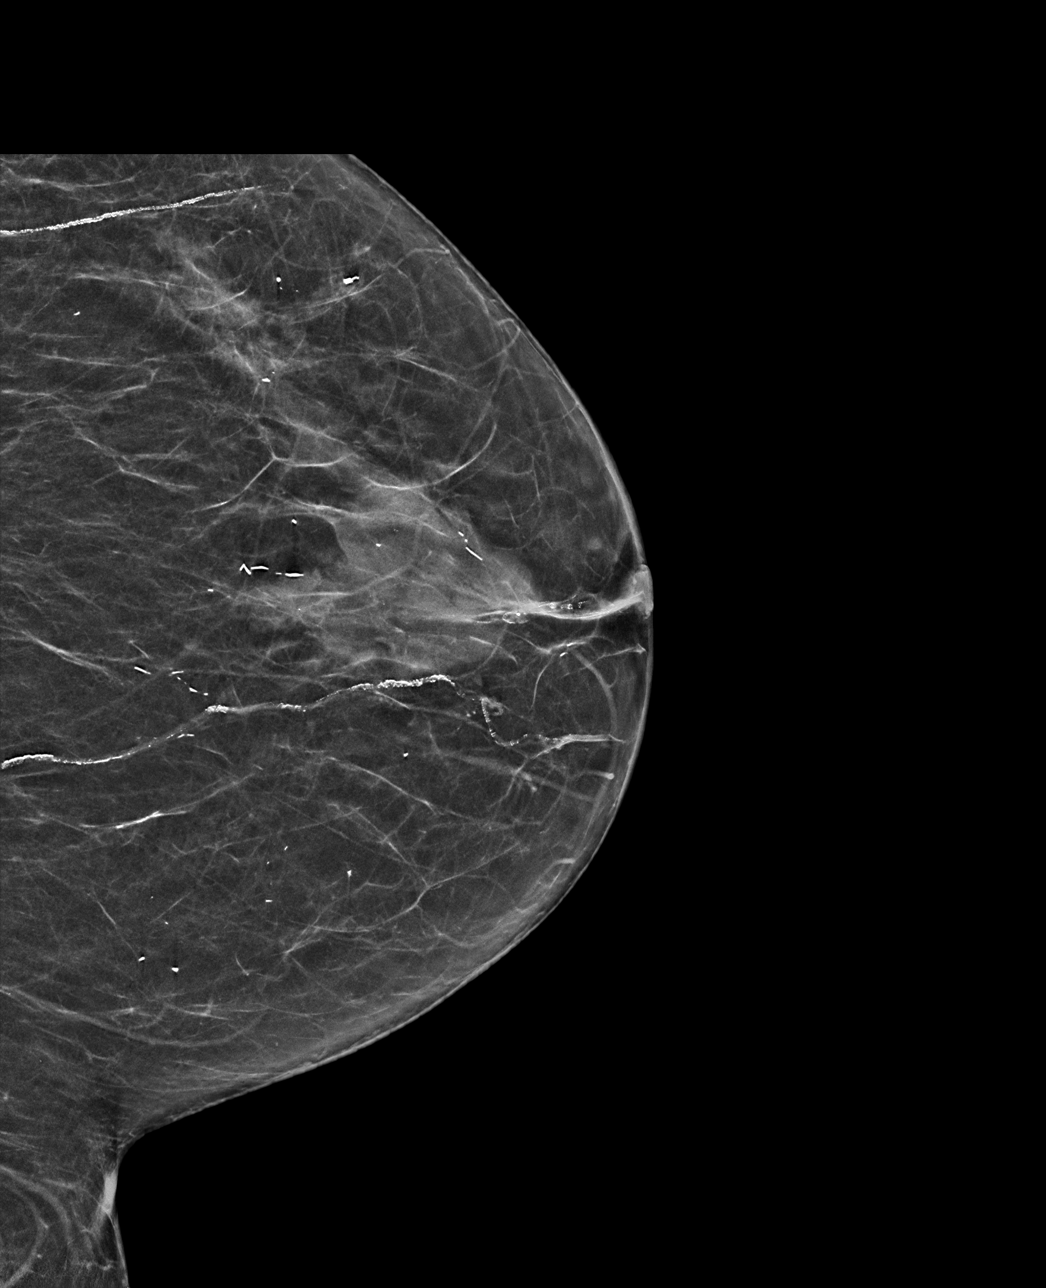

[L MLO synth-2D]
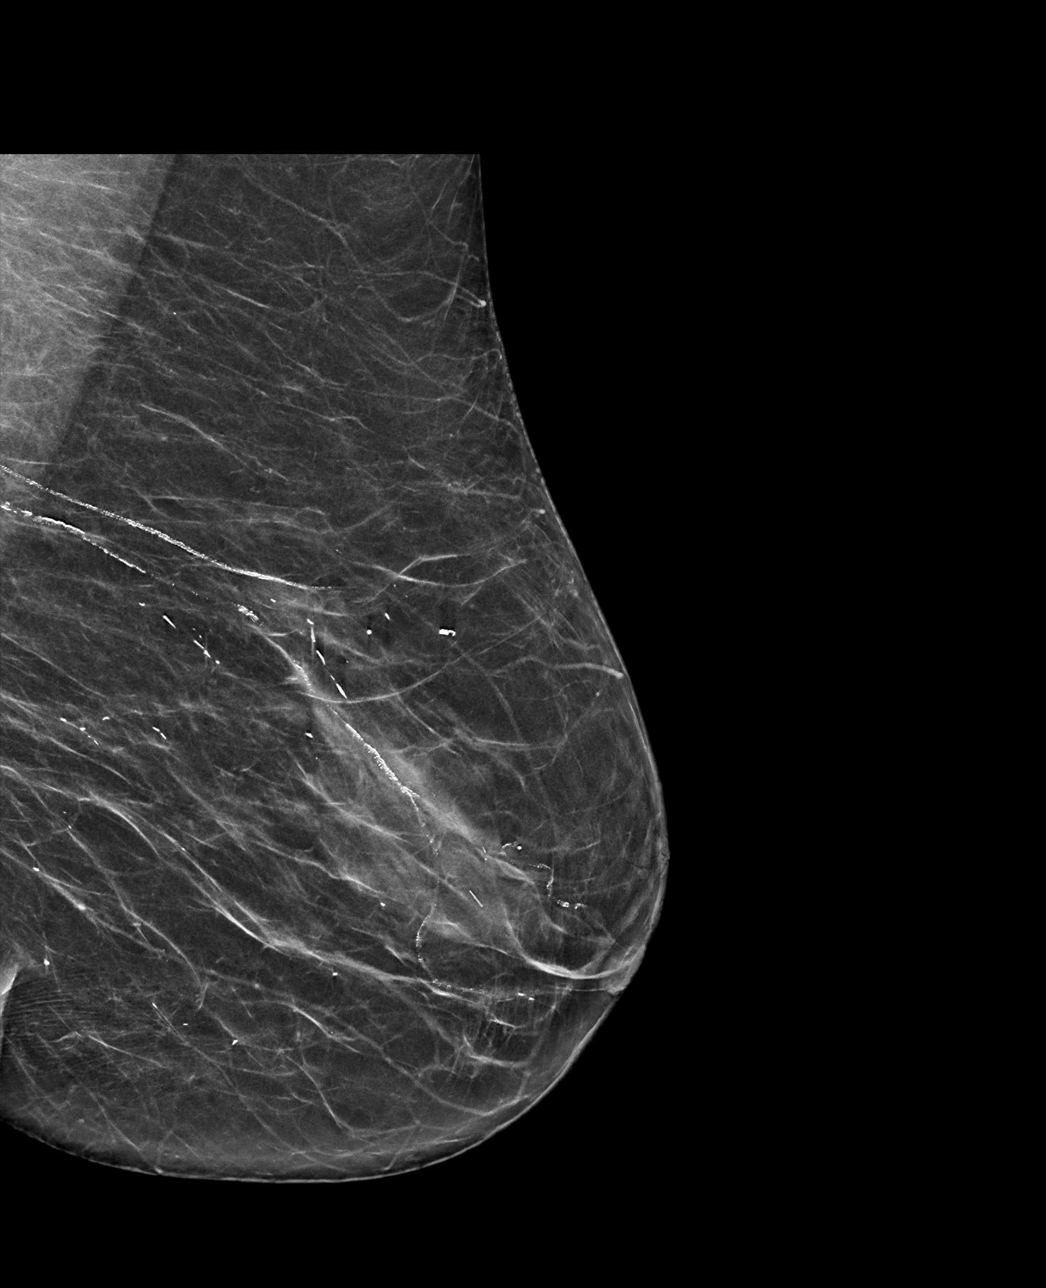

[L CC tomo · tomo slice 29/57.0]
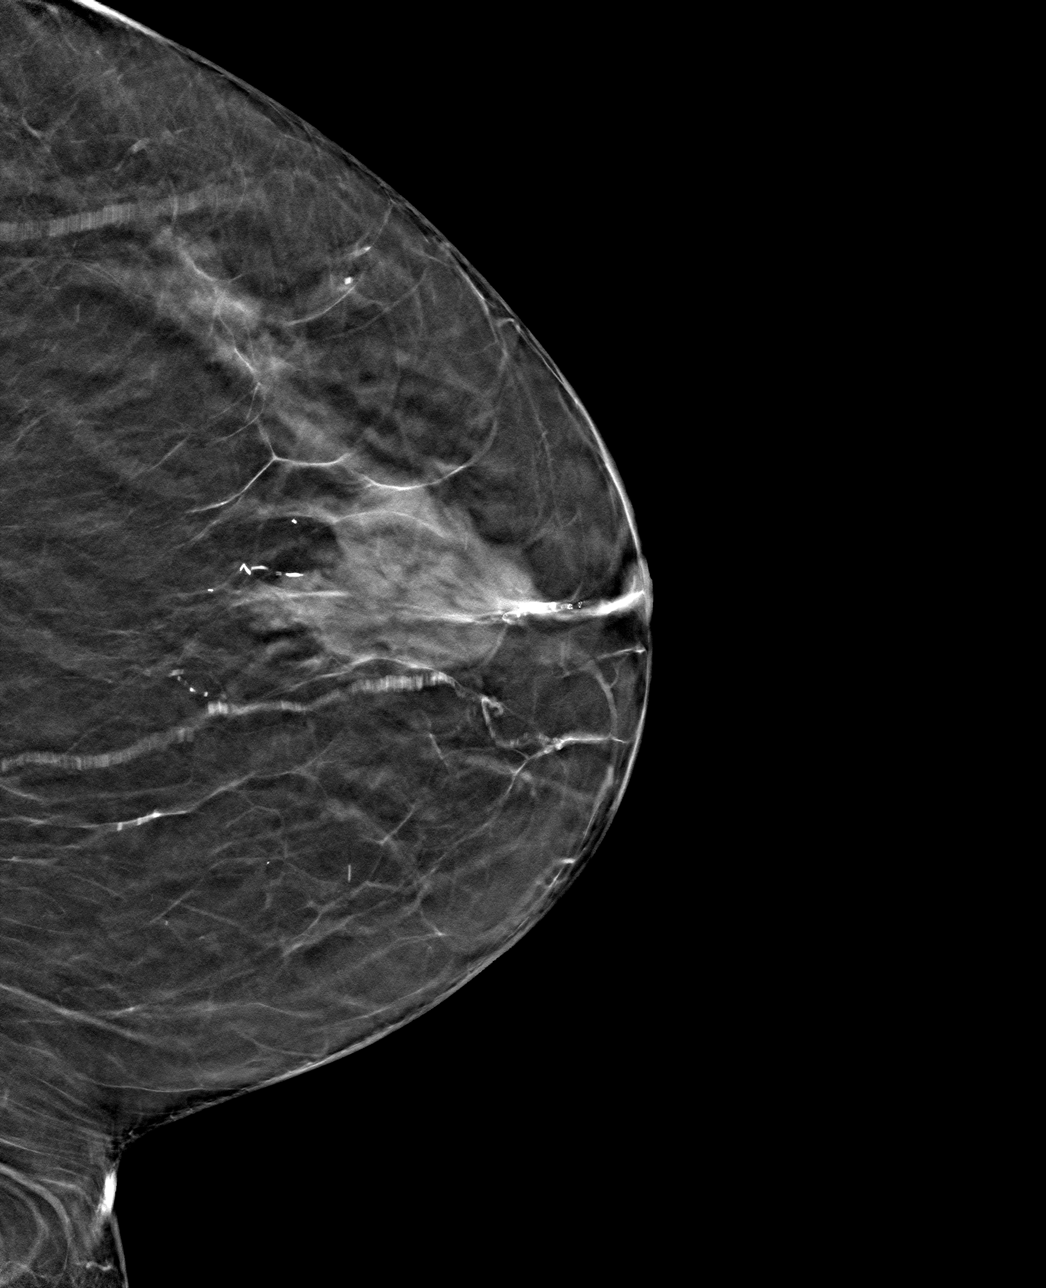

[L MLO tomo · tomo slice 32/63.0]
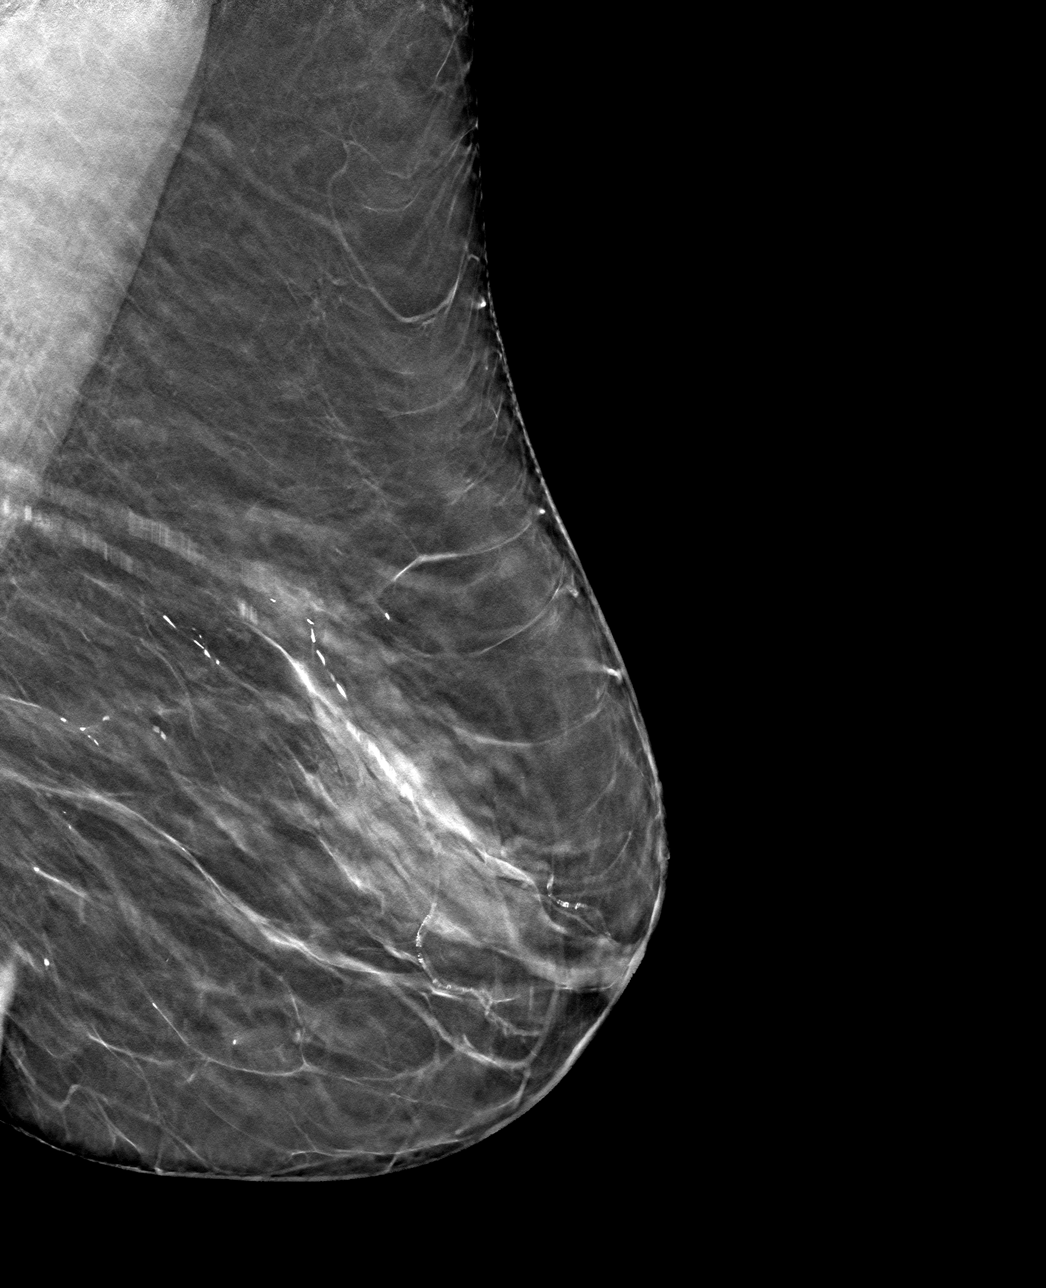

[4 of 12 positions shown; findings below may reference images not displayed]

ACR Breast Density Category b: There are scattered areas of
fibroglandular density.
FINDINGS: There is a coil shaped biopsy marking clip at the site of biopsy
proven DCIS in the upper outer left breast. There are stable
postsurgical biopsy changes in this area. No new interval mass or
other suspicious finding to suggest malignancy at the biopsy site or
elsewhere in the left breast.
IMPRESSION: Stable biopsy site demonstrating DCIS in the upper outer left
breast.

RECOMMENDATION:
Per treatment plan. Diagnostic bilateral mammogram will be due in 6
months.

I have discussed the findings and recommendations with the patient.
If applicable, a reminder letter will be sent to the patient
regarding the next appointment.

BI-RADS CATEGORY  6: Known biopsy-proven malignancy.

## 2021-06-03 DIAGNOSIS — M25551 Pain in right hip: Secondary | ICD-10-CM | POA: Diagnosis not present

## 2021-06-03 DIAGNOSIS — M25552 Pain in left hip: Secondary | ICD-10-CM | POA: Diagnosis not present

## 2021-06-03 DIAGNOSIS — M25512 Pain in left shoulder: Secondary | ICD-10-CM | POA: Diagnosis not present

## 2021-06-03 DIAGNOSIS — M25511 Pain in right shoulder: Secondary | ICD-10-CM | POA: Diagnosis not present

## 2021-06-11 ENCOUNTER — Other Ambulatory Visit: Payer: Self-pay | Admitting: Physician Assistant

## 2021-06-12 DIAGNOSIS — I129 Hypertensive chronic kidney disease with stage 1 through stage 4 chronic kidney disease, or unspecified chronic kidney disease: Secondary | ICD-10-CM | POA: Diagnosis not present

## 2021-06-12 DIAGNOSIS — D649 Anemia, unspecified: Secondary | ICD-10-CM | POA: Diagnosis not present

## 2021-06-14 NOTE — Telephone Encounter (Signed)
Pantoprazole 40 mg # 30 only with message to pharmacy to contact patients pcp for future refills ?

## 2021-06-18 ENCOUNTER — Ambulatory Visit: Payer: Medicare Other | Admitting: Cardiology

## 2021-06-18 ENCOUNTER — Encounter: Payer: Self-pay | Admitting: Cardiology

## 2021-06-18 VITALS — BP 116/72 | HR 64 | Ht 65.5 in | Wt 230.0 lb

## 2021-06-18 DIAGNOSIS — Z7901 Long term (current) use of anticoagulants: Secondary | ICD-10-CM | POA: Diagnosis not present

## 2021-06-18 DIAGNOSIS — I2510 Atherosclerotic heart disease of native coronary artery without angina pectoris: Secondary | ICD-10-CM

## 2021-06-18 DIAGNOSIS — I5032 Chronic diastolic (congestive) heart failure: Secondary | ICD-10-CM | POA: Diagnosis not present

## 2021-06-18 DIAGNOSIS — Z952 Presence of prosthetic heart valve: Secondary | ICD-10-CM

## 2021-06-18 MED ORDER — AZITHROMYCIN 500 MG PO TABS: ORAL_TABLET | ORAL | 11 refills | Status: DC

## 2021-06-18 NOTE — Progress Notes (Signed)
?Cardiology Office Note:   ? ?Date:  06/18/2021  ? ?ID:  Elizabeth [name], DOB Jan 10, 1946, MRN [id number] ? ?PCP:  Marco Collie, MD  ?Cardiologist:  Shirlee More, MD   ? ?Referring MD: Marco Collie, MD  ? ? ?ASSESSMENT:   ? ?1. History of transcatheter aortic valve replacement (TAVR)   ?2. Chronic diastolic heart failure ([place])   ?3. Mild CAD   ?4. Chronic anticoagulation   ? ?PLAN:   ? ?In order of problems listed above: ? ?Done very well has made a full recovery from TAVR presently is anticoagulated no longer on aspirin.  We will do a follow-up echocardiogram in the office 1 year anniversary. ?Heart failure is compensated BP at target continue ACE inhibitor and loop diuretic. ?Stable no anginal discomfort ?Continue anticoagulant finish the initial course 3 to 6 months afterwards I favor half dose anticoagulant with unprovoked unrecognizable risk factor for DVT ? ? ?Next appointment: 1 year ? ? ? ?Medication Adjustments/Labs and Tests Ordered: ?Current medicines are reviewed at length with the patient today.  Concerns regarding medicines are outlined above.  ?Orders Placed This Encounter  ?Procedures  ? ECHOCARDIOGRAM COMPLETE  ? ?Meds ordered this encounter  ?Medications  ? azithromycin (ZITHROMAX) 500 MG tablet  ?  Sig: Take 1 tablet (500 mg) one hour prior to all dental visits.  ?  Dispense:  3 tablet  ?  Refill:  11  ? ? ?No chief complaint on file. ? ? ?History of Present Illness:   ? ?Elizabeth [name] is a 76 y.o. female with a hx of severe symptomatic aortic stenosis.  TAVR 05/28/2019 hypertensive heart disease with heart failure and stage II CKD and hyperlipidemia.  She was last seen 04/28/2018 to complete recovery.  She was having bothersome palpitation and event monitor reported 05/23/2020 showed occasional PVCs and couplets PVC burden 5.5% APCs and brief runs of APCs.  There were no episodes of atrial fibrillation or flutter. ? ?Compliance with diet, lifestyle and medications: Yes ? ?She has been  anticoagulated for 3 months after she was found to have DVT right lower extremity duplex [facility]. ?This was unprovoked.  No identifiable risk factor. ?Otherwise doing well no edema shortness of breath chest pain palpitation or syncope ?She tolerates her anticoagulant without bleeding. ?Past Medical History:  ?Diagnosis Date  ? Anxiety   ? extreme  ? Benign hypertension with CKD (chronic kidney disease), stage II   ? Breast cancer ([place])   ? Pre-cancerous cell in Left breast  ? Chronic diastolic heart failure ([place]) 12/20/2018  ? Chronic pain of both knees 09/12/2017  ? [name] is a 76 y.o. female with low back pain, bilateral knee pain, due to multilevel multifactorial degenerative changes in the lumbar spine, and, significant multiple joint osteoarthritis.   Last Assessment & Plan:  Today I will treat with bilateral knee injections.   Ms. Mottram will return to the clinic in 6 months.  I will asses the efficacy of this treatment and make adjustments to her   ? CKD (chronic kidney disease), stage III ([place])   ? Claustrophobia   ? Ductal carcinoma in situ (DCIS) of left breast 11/08/2017  ? GERD (gastroesophageal reflux disease)   ? History of kidney stones   ? Hypertension 09/25/2018  ? Hyperuricemia   ? Insomnia 09/25/2018  ? Lumbar radiculopathy 11/28/2017  ? [name] is a 76 y.o. female with low back pain, lower extremity pain, on the left, in the setting of  previous laminotomy in April 2019.   Last Assessment & Plan:  Today I will treat with current OTC medications..   Ms. Kleeman will return to the clinic on an as needed basis.  I will asses the efficacy of this treatment and make adjustments to her treatment plan as necessary.  Last Assessment &  ? Lumbar stenosis   ? Osteoarthrosis   ? S/P TAVR (transcatheter aortic valve replacement) 05/28/2019  ? s/p TAVR with a 29 mm Medtronic Evolut Pro + via the TF approach by Dr. Burt Knack and Dr. Cyndia Bent   ? Severe aortic stenosis   ? Tachycardia 09/25/2018  ? Vitamin  D deficiency   ? ? ?Past Surgical History:  ?Procedure Laterality Date  ? ABDOMINAL HYSTERECTOMY    ? ABDOMINAL HYSTERECTOMY    ? APPENDECTOMY    ? BACK SURGERY    ? BREAST BIOPSY Left 10/2017  ? CARDIAC CATHETERIZATION  03/11/2019  ? CHOLECYSTECTOMY    ? GALLBLADDER SURGERY    ? INTRAOPERATIVE TRANSTHORACIC ECHOCARDIOGRAM  05/28/2019  ? Procedure: Intraoperative Transthoracic Echocardiogram;  Surgeon: Sherren Mocha, MD;  Location: [place];  Service: Open Heart Surgery;;  ? IR RADIOLOGY PERIPHERAL GUIDED IV START  05/14/2019  ? IR US GUIDE VASC ACCESS RIGHT  05/14/2019  ? LUMBAR LAMINECTOMY/DECOMPRESSION MICRODISCECTOMY N/A 06/12/2017  ? Procedure: LAMINECTOMY AND FORAMINOTOMY LUMBAR THREE- LUMBAR FOUR, LUMBAR FOUR- LUMBAR FIVE;  Surgeon: Newman Pies, MD;  Location: [place];  Service: Neurosurgery;  Laterality: N/A;  ? RIGHT/LEFT HEART CATH AND CORONARY ANGIOGRAPHY N/A 03/11/2019  ? Procedure: RIGHT/LEFT HEART CATH AND CORONARY ANGIOGRAPHY;  Surgeon: Sherren Mocha, MD;  Location: [place] [facility];  Service: Cardiovascular;  Laterality: N/A;  ? TONSILLECTOMY    ? TRANSCATHETER AORTIC VALVE REPLACEMENT, TRANSFEMORAL N/A 05/28/2019  ? Procedure: TRANSCATHETER AORTIC VALVE REPLACEMENT, TRANSFEMORAL;  Surgeon: Sherren Mocha, MD;  Location: [place];  Service: Open Heart Surgery;  Laterality: N/A;  ? ? ?Current Medications: ?Current Meds  ?Medication Sig  ? amLODipine (NORVASC) 5 MG tablet TAKE 1 TABLET(5 MG) BY MOUTH DAILY  ? carvedilol (COREG) 6.25 MG tablet TAKE 1 TABLET(6.25 MG) BY MOUTH TWICE DAILY WITH A MEAL  ? furosemide (LASIX) 20 MG tablet TAKE 1 TABLET(20 MG) BY MOUTH DAILY  ? lisinopril (ZESTRIL) 10 MG tablet TAKE 1 TABLET(10 MG) BY MOUTH DAILY  ? pantoprazole (PROTONIX) 40 MG tablet TAKE 1 TABLET(40 MG) BY MOUTH DAILY  ? pregabalin (LYRICA) 75 MG capsule Take 75 mg by mouth 2 (two) times daily.  ? tamoxifen (NOLVADEX) 20 MG tablet TAKE 1 TABLET(20 MG) BY MOUTH DAILY  ? TART CHERRY PO Take 7,000 mg by  mouth. Take 2 tablets daily  ? XARELTO 20 MG TABS tablet Take 20 mg by mouth daily.  ?  ? ?Allergies:   Penicillins, Atorvastatin, Codeine, and Prednisone  ? ?Social History  ? ?Socioeconomic History  ? Marital status: Married  ?  Spouse name: Not on file  ? Number of children: 2  ? Years of education: 6  ? Highest education level: Not on file  ?Occupational History  ? Occupation: child care  ?Tobacco Use  ? Smoking status: Never  ?  Passive exposure: Never  ? Smokeless tobacco: Never  ?Vaping Use  ? Vaping Use: Never used  ?Substance and Sexual Activity  ? Alcohol use: No  ? Drug use: No  ? Sexual activity: Not on file  ?Other Topics Concern  ? Not on file  ?Social History Narrative  ? Lives with husband in  a one story home.  Has 2 children.  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  Education: high school.    ? ?Social Determinants of Health  ? ?Financial Resource Strain: Not on file  ?Food Insecurity: Not on file  ?Transportation Needs: Not on file  ?Physical Activity: Not on file  ?Stress: Not on file  ?Social Connections: Not on file  ?  ? ?Family History: ?The patient's family history includes Alzheimer's disease in her mother; Aneurysm in her father; Stomach cancer in her brother. ?ROS:   ?Please see the history of present illness.    ?All other systems reviewed and are negative. ? ?EKGs/Labs/Other Studies Reviewed:   ? ?The following studies were reviewed today: ? ?11/24/2021: ?Cholesterol 195 LDL 117 HDL 58 triglycerides 112 A1c 5.0 hemoglobin 10.0 creatinine 1.14 potassium 4.3 ? ? ?Physical Exam:   ? ?VS:  BP 116/72 (BP Location: Left Arm)   Pulse 64   Ht 5' 5.5" (1.664 m)   Wt 230 lb (104.3 kg)   SpO2 99%   BMI 37.69 kg/m?    ? ?Wt Readings from Last 3 Encounters:  ?06/18/21 230 lb (104.3 kg)  ?07/01/20 227 lb 12.8 oz (103.3 kg)  ?05/27/20 230 lb (104.3 kg)  ?  ? ?GEN: 106 benign flow murmur.  L well nourished, well developed in no acute distress ?HEENT: Normal ?NECK: No JVD; No carotid bruits ?LYMPHATICS:  No lymphadenopathy ?CARDIAC: RRR, 1 of 6 soft systolic murmur across the prosthetic aortic no AR rubs, gallops ?RESPIRATORY:  Clear to auscultation without rales, wheezing or rhonchi  ?ABDOMEN: Soft, non-tender,

## 2021-06-18 NOTE — Patient Instructions (Signed)
Medication Instructions:  ?Your physician has recommended you make the following change in your medication: Take a Childrens Chewable Iron every other day  ? ?*If you need a refill on your cardiac medications before your next appointment, please call your pharmacy* ? ? ?Lab Work: ?None ordered ?If you have labs (blood work) drawn today and your tests are completely normal, you will receive your results only by: ?MyChart Message (if you have MyChart) OR ?A paper copy in the mail ?If you have any lab test that is abnormal or we need to change your treatment, we will call you to review the results. ? ? ?Testing/Procedures: ?Your physician has requested that you have an echocardiogram. Echocardiography is a painless test that uses sound waves to create images of your heart. It provides your doctor with information about the size and shape of your heart and how well your heart?s chambers and valves are working. This procedure takes approximately one hour. There are no restrictions for this procedure. ? ? ? ?Follow-Up: ?At [facility], you and your health needs are our priority.  As part of our continuing mission to provide you with exceptional heart care, we have created designated Provider Care Teams.  These Care Teams include your primary Cardiologist (physician) and Advanced Practice Providers (APPs -  Physician Assistants and Nurse Practitioners) who all work together to provide you with the care you need, when you need it. ? ?We recommend signing up for the patient portal called "MyChart".  Sign up information is provided on this After Visit Summary.  MyChart is used to connect with patients for Virtual Visits (Telemedicine).  Patients are able to view lab/test results, encounter notes, upcoming appointments, etc.  Non-urgent messages can be sent to your provider as well.   ?To learn more about what you can do with MyChart, go to [email or web address].   ? ?Your next appointment:   ?12 month(s) ? ?The format  for your next appointment:   ?In Person ? ?Provider:   ?Shirlee More, MD ? ? ?Other Instructions ?Echocardiogram ?An echocardiogram is a test that uses sound waves (ultrasound) to produce images of the heart. ?Images from an echocardiogram can provide important information about: ?Heart size and shape. ?The size and thickness and movement of your heart's walls. ?Heart muscle function and strength. ?Heart valve function or if you have stenosis. Stenosis is when the heart valves are too narrow. ?If blood is flowing backward through the heart valves (regurgitation). ?A tumor or infectious growth around the heart valves. ?Areas of heart muscle that are not working well because of poor blood flow or injury from a heart attack. ?Aneurysm detection. An aneurysm is a weak or damaged part of an artery wall. The wall bulges out from the normal force of blood pumping through the body. ?Tell a health care provider about: ?Any allergies you have. ?All medicines you are taking, including vitamins, herbs, eye drops, creams, and over-the-counter medicines. ?Any blood disorders you have. ?Any surgeries you have had. ?Any medical conditions you have. ?Whether you are pregnant or may be pregnant. ?What are the risks? ?Generally, this is a safe test. However, problems may occur, including an allergic reaction to dye (contrast) that may be used during the test. ?What happens before the test? ?No specific preparation is needed. You may eat and drink normally. ?What happens during the test? ?You will take off your clothes from the waist up and put on a hospital gown. ?Electrodes or electrocardiogram (ECG)patches may be placed on your chest.  The electrodes or patches are then connected to a device that monitors your heart rate and rhythm. ?You will lie down on a table for an ultrasound exam. A gel will be applied to your chest to help sound waves pass through your skin. ?A handheld device, called a transducer, will be pressed against your  chest and moved over your heart. The transducer produces sound waves that travel to your heart and bounce back (or "echo" back) to the transducer. These sound waves will be captured in real-time and changed into images of your heart that can be viewed on a video monitor. The images will be recorded on a computer and reviewed by your health care provider. ?You may be asked to change positions or hold your breath for a short time. This makes it easier to get different views or better views of your heart. ?In some cases, you may receive contrast through an IV in one of your veins. This can improve the quality of the pictures from your heart. ?The procedure may vary among health care providers and hospitals.   ?What can I expect after the test? ?You may return to your normal, everyday life, including diet, activities, and medicines, unless your health care provider tells you not to do that. ?Follow these instructions at home: ?It is up to you to get the results of your test. Ask your health care provider, or the department that is doing the test, when your results will be ready. ?Keep all follow-up visits. This is important. ?Summary ?An echocardiogram is a test that uses sound waves (ultrasound) to produce images of the heart. ?Images from an echocardiogram can provide important information about the size and shape of your heart, heart muscle function, heart valve function, and other possible heart problems. ?You do not need to do anything to prepare before this test. You may eat and drink normally. ?After the echocardiogram is completed, you may return to your normal, everyday life, unless your health care provider tells you not to do that. ?This information is not intended to replace advice given to you by your health care provider. Make sure you discuss any questions you have with your health care provider. ?Document Revised: 10/22/2019 Document Reviewed: 10/22/2019 ?Elsevier Patient Education ? [place]. ?    ?

## 2021-06-21 DIAGNOSIS — M25561 Pain in right knee: Secondary | ICD-10-CM | POA: Diagnosis not present

## 2021-06-21 DIAGNOSIS — M25562 Pain in left knee: Secondary | ICD-10-CM | POA: Diagnosis not present

## 2021-06-28 ENCOUNTER — Other Ambulatory Visit: Payer: Medicare Other

## 2021-06-28 ENCOUNTER — Other Ambulatory Visit: Payer: Self-pay | Admitting: Physician Assistant

## 2021-06-30 DIAGNOSIS — M170 Bilateral primary osteoarthritis of knee: Secondary | ICD-10-CM | POA: Diagnosis not present

## 2021-07-07 DIAGNOSIS — M170 Bilateral primary osteoarthritis of knee: Secondary | ICD-10-CM | POA: Diagnosis not present

## 2021-07-12 DIAGNOSIS — D649 Anemia, unspecified: Secondary | ICD-10-CM | POA: Diagnosis not present

## 2021-07-12 DIAGNOSIS — I129 Hypertensive chronic kidney disease with stage 1 through stage 4 chronic kidney disease, or unspecified chronic kidney disease: Secondary | ICD-10-CM | POA: Diagnosis not present

## 2021-07-14 DIAGNOSIS — M170 Bilateral primary osteoarthritis of knee: Secondary | ICD-10-CM | POA: Diagnosis not present

## 2021-07-20 DIAGNOSIS — M170 Bilateral primary osteoarthritis of knee: Secondary | ICD-10-CM | POA: Diagnosis not present

## 2021-07-20 DIAGNOSIS — Z86718 Personal history of other venous thrombosis and embolism: Secondary | ICD-10-CM | POA: Diagnosis not present

## 2021-07-21 ENCOUNTER — Other Ambulatory Visit: Payer: Medicare Other

## 2021-08-04 ENCOUNTER — Ambulatory Visit (INDEPENDENT_AMBULATORY_CARE_PROVIDER_SITE_OTHER): Payer: Medicare Other

## 2021-08-04 ENCOUNTER — Other Ambulatory Visit: Payer: Self-pay

## 2021-08-04 DIAGNOSIS — Z952 Presence of prosthetic heart valve: Secondary | ICD-10-CM | POA: Diagnosis not present

## 2021-08-04 LAB — ECHOCARDIOGRAM COMPLETE
AR max vel: 0.9 cm2
AV Area VTI: 0.96 cm2
AV Area mean vel: 0.97 cm2
AV Mean grad: 14 mmHg
AV Peak grad: 26.6 mmHg
Ao pk vel: 2.58 m/s
Area-P 1/2: 2.21 cm2
S' Lateral: 3.8 cm

## 2021-08-09 ENCOUNTER — Other Ambulatory Visit: Payer: Self-pay | Admitting: Cardiology

## 2021-08-11 ENCOUNTER — Other Ambulatory Visit: Payer: Self-pay | Admitting: Cardiovascular Disease

## 2021-08-12 ENCOUNTER — Other Ambulatory Visit: Payer: Self-pay | Admitting: Cardiovascular Disease

## 2021-08-12 DIAGNOSIS — I129 Hypertensive chronic kidney disease with stage 1 through stage 4 chronic kidney disease, or unspecified chronic kidney disease: Secondary | ICD-10-CM | POA: Diagnosis not present

## 2021-08-12 DIAGNOSIS — D649 Anemia, unspecified: Secondary | ICD-10-CM | POA: Diagnosis not present

## 2021-08-25 DIAGNOSIS — M25552 Pain in left hip: Secondary | ICD-10-CM | POA: Diagnosis not present

## 2021-08-25 DIAGNOSIS — M25551 Pain in right hip: Secondary | ICD-10-CM | POA: Diagnosis not present

## 2021-08-25 DIAGNOSIS — M545 Low back pain, unspecified: Secondary | ICD-10-CM | POA: Diagnosis not present

## 2021-09-11 DIAGNOSIS — I129 Hypertensive chronic kidney disease with stage 1 through stage 4 chronic kidney disease, or unspecified chronic kidney disease: Secondary | ICD-10-CM | POA: Diagnosis not present

## 2021-09-11 DIAGNOSIS — D649 Anemia, unspecified: Secondary | ICD-10-CM | POA: Diagnosis not present

## 2021-09-15 DIAGNOSIS — R2689 Other abnormalities of gait and mobility: Secondary | ICD-10-CM | POA: Diagnosis not present

## 2021-09-15 DIAGNOSIS — M79605 Pain in left leg: Secondary | ICD-10-CM | POA: Diagnosis not present

## 2021-09-15 DIAGNOSIS — M79604 Pain in right leg: Secondary | ICD-10-CM | POA: Diagnosis not present

## 2021-09-15 DIAGNOSIS — M6281 Muscle weakness (generalized): Secondary | ICD-10-CM | POA: Diagnosis not present

## 2021-09-18 ENCOUNTER — Other Ambulatory Visit: Payer: Self-pay | Admitting: Cardiology

## 2021-09-22 DIAGNOSIS — M79605 Pain in left leg: Secondary | ICD-10-CM | POA: Diagnosis not present

## 2021-09-22 DIAGNOSIS — M79604 Pain in right leg: Secondary | ICD-10-CM | POA: Diagnosis not present

## 2021-09-22 DIAGNOSIS — M6281 Muscle weakness (generalized): Secondary | ICD-10-CM | POA: Diagnosis not present

## 2021-09-22 DIAGNOSIS — R2689 Other abnormalities of gait and mobility: Secondary | ICD-10-CM | POA: Diagnosis not present

## 2021-09-27 DIAGNOSIS — M79604 Pain in right leg: Secondary | ICD-10-CM | POA: Diagnosis not present

## 2021-09-27 DIAGNOSIS — R2689 Other abnormalities of gait and mobility: Secondary | ICD-10-CM | POA: Diagnosis not present

## 2021-09-27 DIAGNOSIS — M6281 Muscle weakness (generalized): Secondary | ICD-10-CM | POA: Diagnosis not present

## 2021-09-27 DIAGNOSIS — M79605 Pain in left leg: Secondary | ICD-10-CM | POA: Diagnosis not present

## 2021-09-29 DIAGNOSIS — M6281 Muscle weakness (generalized): Secondary | ICD-10-CM | POA: Diagnosis not present

## 2021-09-29 DIAGNOSIS — M79604 Pain in right leg: Secondary | ICD-10-CM | POA: Diagnosis not present

## 2021-09-29 DIAGNOSIS — R2689 Other abnormalities of gait and mobility: Secondary | ICD-10-CM | POA: Diagnosis not present

## 2021-09-29 DIAGNOSIS — M79605 Pain in left leg: Secondary | ICD-10-CM | POA: Diagnosis not present

## 2021-10-04 DIAGNOSIS — R2689 Other abnormalities of gait and mobility: Secondary | ICD-10-CM | POA: Diagnosis not present

## 2021-10-04 DIAGNOSIS — M79604 Pain in right leg: Secondary | ICD-10-CM | POA: Diagnosis not present

## 2021-10-04 DIAGNOSIS — M6281 Muscle weakness (generalized): Secondary | ICD-10-CM | POA: Diagnosis not present

## 2021-10-04 DIAGNOSIS — M79605 Pain in left leg: Secondary | ICD-10-CM | POA: Diagnosis not present

## 2021-10-06 ENCOUNTER — Other Ambulatory Visit: Payer: Self-pay | Admitting: Orthopedic Surgery

## 2021-10-06 DIAGNOSIS — M79605 Pain in left leg: Secondary | ICD-10-CM | POA: Diagnosis not present

## 2021-10-06 DIAGNOSIS — M5410 Radiculopathy, site unspecified: Secondary | ICD-10-CM

## 2021-10-06 DIAGNOSIS — M6281 Muscle weakness (generalized): Secondary | ICD-10-CM | POA: Diagnosis not present

## 2021-10-06 DIAGNOSIS — R2689 Other abnormalities of gait and mobility: Secondary | ICD-10-CM | POA: Diagnosis not present

## 2021-10-06 DIAGNOSIS — M79604 Pain in right leg: Secondary | ICD-10-CM | POA: Diagnosis not present

## 2021-10-12 DIAGNOSIS — I129 Hypertensive chronic kidney disease with stage 1 through stage 4 chronic kidney disease, or unspecified chronic kidney disease: Secondary | ICD-10-CM | POA: Diagnosis not present

## 2021-10-12 DIAGNOSIS — D649 Anemia, unspecified: Secondary | ICD-10-CM | POA: Diagnosis not present

## 2021-10-13 ENCOUNTER — Other Ambulatory Visit: Payer: Self-pay | Admitting: Hematology and Oncology

## 2021-10-18 DIAGNOSIS — I129 Hypertensive chronic kidney disease with stage 1 through stage 4 chronic kidney disease, or unspecified chronic kidney disease: Secondary | ICD-10-CM | POA: Diagnosis not present

## 2021-10-18 DIAGNOSIS — E782 Mixed hyperlipidemia: Secondary | ICD-10-CM | POA: Diagnosis not present

## 2021-10-18 NOTE — Progress Notes (Signed)
Patient Care Team: Marco Collie, MD as PCP - General (Family Medicine)  DIAGNOSIS:  Encounter Diagnosis  Name Primary?   Ductal carcinoma in situ (DCIS) of left breast     SUMMARY OF ONCOLOGIC HISTORY: Oncology History  Ductal carcinoma in situ (DCIS) of left breast  10/23/2017 Initial Diagnosis   Screening detected 5 mm indeterminate mass left breast upper outer quadrant biopsy revealed intermediate grade DCIS ER 100%, PR 100%, Tis N0 stage 0   11/08/2017 -  Anti-estrogen oral therapy   Tamoxifen as part of active surveillance because the patient does not want to undergo surgery at this time.     CHIEF COMPLIANT: Follow-up of left breast DCIS on tamoxifen  INTERVAL HISTORY: Elizabeth [name] is a 76 y.o. with above-mentioned history of left breast DCIS who declined surgery and is currently on active surveillance with tamoxifen. She presents to the clinic today for a follow-up. She states that she fell yesterday and just sore. She denies any side effects or symptoms from the tamoxifen. She does get time for exercise.   ALLERGIES:  is allergic to penicillins, atorvastatin, codeine, and prednisone.  MEDICATIONS:  Current Outpatient Medications  Medication Sig Dispense Refill   amLODipine (NORVASC) 5 MG tablet TAKE 1 TABLET(5 MG) BY MOUTH DAILY 90 tablet 3   azithromycin (ZITHROMAX) 500 MG tablet Take 1 tablet (500 mg) one hour prior to all dental visits. 3 tablet 11   carvedilol (COREG) 6.25 MG tablet TAKE 1 TABLET(6.25 MG) BY MOUTH TWICE DAILY WITH A MEAL 180 tablet 1   furosemide (LASIX) 20 MG tablet TAKE 1 TABLET(20 MG) BY MOUTH DAILY 90 tablet 3   lisinopril (ZESTRIL) 10 MG tablet TAKE 1 TABLET(10 MG) BY MOUTH DAILY 90 tablet 3   pantoprazole (PROTONIX) 40 MG tablet TAKE 1 TABLET(40 MG) BY MOUTH DAILY 90 tablet 2   tamoxifen (NOLVADEX) 20 MG tablet Take 1 tablet (20 mg total) by mouth daily. 90 tablet 3   TART CHERRY PO Take 7,000 mg by mouth. Take 2 tablets daily      XARELTO 20 MG TABS tablet Take 20 mg by mouth daily.     No current facility-administered medications for this visit.    PHYSICAL EXAMINATION: ECOG PERFORMANCE STATUS: 1 - Symptomatic but completely ambulatory  Vitals:   10/27/21 0852  BP: (!) 135/57  Pulse: 83  Resp: 18  Temp: (!) 97.5 F (36.4 C)  SpO2: 100%   Filed Weights   10/27/21 0852  Weight: 235 lb 11.2 oz (106.9 kg)    BREAST: No palpable masses or nodules in either right or left breasts. No palpable axillary supraclavicular or infraclavicular adenopathy no breast tenderness or nipple discharge. (exam performed in the presence of a chaperone)  LABORATORY DATA:  I have reviewed the data as listed    Latest Ref Rng & Units 04/28/2020   11:49 AM 07/29/2019    1:39 PM 05/29/2019    3:50 AM  CMP  Glucose 65 - 99 mg/dL 90  86  137   BUN 8 - 27 mg/dL '14  24  15   '$ Creatinine 0.57 - 1.00 mg/dL 0.96  1.04  0.82   Sodium 134 - 144 mmol/L 144  142  140   Potassium 3.5 - 5.2 mmol/L 4.7  4.0  3.5   Chloride 96 - 106 mmol/L 108  107  109   CO2 20 - 29 mmol/L '18  22  20   '$ Calcium 8.7 - 10.3 mg/dL 9.2  8.7  8.2   Total Protein 6.0 - 8.5 g/dL 6.8     Total Bilirubin 0.0 - 1.2 mg/dL 0.3     Alkaline Phos 44 - 121 IU/L 64     AST 0 - 40 IU/L 19     ALT 0 - 32 IU/L 11       Lab Results  Component Value Date   WBC 9.4 10/27/2021   HGB 8.8 (L) 10/27/2021   HCT 27.9 (L) 10/27/2021   MCV 81.8 10/27/2021   PLT 146 (L) 10/27/2021   NEUTROABS 6.7 10/27/2021    ASSESSMENT & PLAN:  Ductal carcinoma in situ (DCIS) of left breast 10/23/2017: Screening detected 5 mm indeterminate mass left breast upper outer quadrant biopsy revealed intermediate grade DCIS ER 100%, PR 100%, Tis N0 stage 0   Treatment plan: Active surveillance with tamoxifen and mammograms every 6 months.  Patient decided not to undergo surgery. Current treatment: Tamoxifen 20 mg daily started 10/31/2017 We might stop tamoxifen after 06-18-2022.  It is unclear whether she  will need to stay on it for longer than that.  Tamoxifen toxicities: Denies any hot flashes or myalgias.   Breast cancer surveillance: 1.  Left breast mammogram 05/26/2021: Stable biopsy site demonstrating DCIS in the upper outer left breast Mammograms 11/25/2020: Stable postbiopsy changes in the upper outer left breast with no interval change.  Left breast mammogram in 6 months 2.  Breast exam 10/27/2021: Benign   Her husband passed away in 06/17/20. Return to clinic in 1 year for follow-up    Orders Placed This Encounter  Procedures   MM DIAG BREAST TOMO BILATERAL    Standing Status:   Future    Standing Expiration Date:   10/28/2022    Order Specific Question:   Reason for Exam (SYMPTOM  OR DIAGNOSIS REQUIRED)    Answer:   annual diagnostic mammograms with DCIS on active surveillance    Order Specific Question:   Preferred imaging location?    Answer:   [facility]    Order Specific Question:   Release to patient    Answer:   Immediate   MM DIAG BREAST TOMO UNI LEFT    Standing Status:   Future    Standing Expiration Date:   10/28/2022    Order Specific Question:   Reason for Exam (SYMPTOM  OR DIAGNOSIS REQUIRED)    Answer:   Left DCIS 6 month follow up    Order Specific Question:   Preferred imaging location?    Answer:   [facility]   The patient has a good understanding of the overall plan. she agrees with it. she will call with any problems that may develop before the next visit here. Total time spent: 30 mins including face to face time and time spent for planning, charting and co-ordination of care   Harriette Ohara, MD 10/27/21    I Gardiner Coins am scribing for Dr. Lindi Adie  I have reviewed the above documentation for accuracy and completeness, and I agree with the above.

## 2021-10-19 ENCOUNTER — Other Ambulatory Visit: Payer: Medicare Other

## 2021-10-20 DIAGNOSIS — R2689 Other abnormalities of gait and mobility: Secondary | ICD-10-CM | POA: Diagnosis not present

## 2021-10-20 DIAGNOSIS — M6281 Muscle weakness (generalized): Secondary | ICD-10-CM | POA: Diagnosis not present

## 2021-10-20 DIAGNOSIS — M79605 Pain in left leg: Secondary | ICD-10-CM | POA: Diagnosis not present

## 2021-10-20 DIAGNOSIS — M79604 Pain in right leg: Secondary | ICD-10-CM | POA: Diagnosis not present

## 2021-10-25 DIAGNOSIS — M6281 Muscle weakness (generalized): Secondary | ICD-10-CM | POA: Diagnosis not present

## 2021-10-25 DIAGNOSIS — M79604 Pain in right leg: Secondary | ICD-10-CM | POA: Diagnosis not present

## 2021-10-25 DIAGNOSIS — R2689 Other abnormalities of gait and mobility: Secondary | ICD-10-CM | POA: Diagnosis not present

## 2021-10-25 DIAGNOSIS — M79605 Pain in left leg: Secondary | ICD-10-CM | POA: Diagnosis not present

## 2021-10-26 ENCOUNTER — Other Ambulatory Visit: Payer: Self-pay | Admitting: *Deleted

## 2021-10-26 DIAGNOSIS — D0512 Intraductal carcinoma in situ of left breast: Secondary | ICD-10-CM

## 2021-10-27 ENCOUNTER — Other Ambulatory Visit: Payer: Self-pay | Admitting: *Deleted

## 2021-10-27 ENCOUNTER — Other Ambulatory Visit: Payer: Self-pay

## 2021-10-27 ENCOUNTER — Inpatient Hospital Stay: Payer: Medicare Other | Admitting: Hematology and Oncology

## 2021-10-27 ENCOUNTER — Inpatient Hospital Stay: Payer: Medicare Other | Attending: Hematology and Oncology

## 2021-10-27 DIAGNOSIS — Z79810 Long term (current) use of selective estrogen receptor modulators (SERMs): Secondary | ICD-10-CM | POA: Diagnosis not present

## 2021-10-27 DIAGNOSIS — D0512 Intraductal carcinoma in situ of left breast: Secondary | ICD-10-CM | POA: Insufficient documentation

## 2021-10-27 LAB — CMP (CANCER CENTER ONLY)
ALT: 10 U/L (ref 0–44)
AST: 15 U/L (ref 15–41)
Albumin: 4 g/dL (ref 3.5–5.0)
Alkaline Phosphatase: 45 U/L (ref 38–126)
Anion gap: 5 (ref 5–15)
BUN: 17 mg/dL (ref 8–23)
CO2: 26 mmol/L (ref 22–32)
Calcium: 9.2 mg/dL (ref 8.9–10.3)
Chloride: 111 mmol/L (ref 98–111)
Creatinine: 1.06 mg/dL — ABNORMAL HIGH (ref 0.44–1.00)
GFR, Estimated: 54 mL/min — ABNORMAL LOW (ref >60)
Glucose, Bld: 99 mg/dL (ref 70–99)
Potassium: 4.3 mmol/L (ref 3.5–5.1)
Sodium: 142 mmol/L (ref 135–145)
Total Bilirubin: 0.4 mg/dL (ref 0.3–1.2)
Total Protein: 6.7 g/dL (ref 6.5–8.1)

## 2021-10-27 LAB — CBC WITH DIFFERENTIAL (CANCER CENTER ONLY)
Abs Immature Granulocytes: 0.04 10*3/uL (ref 0.00–0.07)
Basophils Absolute: 0 10*3/uL (ref 0.0–0.1)
Basophils Relative: 0 %
Eosinophils Absolute: 0.1 10*3/uL (ref 0.0–0.5)
Eosinophils Relative: 1 %
HCT: 27.9 % — ABNORMAL LOW (ref 36.0–46.0)
Hemoglobin: 8.8 g/dL — ABNORMAL LOW (ref 12.0–15.0)
Immature Granulocytes: 0 %
Lymphocytes Relative: 17 %
Lymphs Abs: 1.6 10*3/uL (ref 0.7–4.0)
MCH: 25.8 pg — ABNORMAL LOW (ref 26.0–34.0)
MCHC: 31.5 g/dL (ref 30.0–36.0)
MCV: 81.8 fL (ref 80.0–100.0)
Monocytes Absolute: 1 10*3/uL (ref 0.1–1.0)
Monocytes Relative: 10 %
Neutro Abs: 6.7 10*3/uL (ref 1.7–7.7)
Neutrophils Relative %: 72 %
Platelet Count: 146 10*3/uL — ABNORMAL LOW (ref 150–400)
RBC: 3.41 MIL/uL — ABNORMAL LOW (ref 3.87–5.11)
RDW: 16 % — ABNORMAL HIGH (ref 11.5–15.5)
WBC Count: 9.4 10*3/uL (ref 4.0–10.5)
nRBC: 0 % (ref 0.0–0.2)

## 2021-10-27 MED ORDER — TAMOXIFEN CITRATE 20 MG PO TABS: 20.0000 mg | ORAL_TABLET | Freq: Every day | ORAL | 3 refills | Status: DC

## 2021-10-27 NOTE — Assessment & Plan Note (Signed)
10/23/2017:Screening detected 5 mm indeterminate mass left breast upper outer quadrant biopsy revealed intermediate grade DCIS ER 100%, PR 100%, Tis N0 stage 0  Treatment plan: Active surveillance with tamoxifen and mammograms every 6 months. Patient decided not to undergo surgery. Current treatment: Tamoxifen 20 mg daily started 10/31/2017  Tamoxifen toxicities:Denies any hot flashes or myalgias.  Breast cancer surveillance: 1.Left breast mammogram 05/26/2021: Stable biopsy site demonstrating DCIS in the upper outer left breast Mammograms 11/25/2020: Stable postbiopsy changes in the upper outer left breast with no interval change.  Left breast mammogram in 6 months 2.Breast exam 10/27/2021: Benign  Her husband passed away in 05-28-2020. Return to clinic in 1 year for follow-up

## 2021-10-28 ENCOUNTER — Telehealth: Payer: Self-pay | Admitting: Hematology and Oncology

## 2021-10-28 NOTE — Telephone Encounter (Signed)
Scheduled appointment per 8/16 los. Patient is aware. 

## 2021-11-03 DIAGNOSIS — R2689 Other abnormalities of gait and mobility: Secondary | ICD-10-CM | POA: Diagnosis not present

## 2021-11-03 DIAGNOSIS — M79605 Pain in left leg: Secondary | ICD-10-CM | POA: Diagnosis not present

## 2021-11-03 DIAGNOSIS — M79604 Pain in right leg: Secondary | ICD-10-CM | POA: Diagnosis not present

## 2021-11-03 DIAGNOSIS — M6281 Muscle weakness (generalized): Secondary | ICD-10-CM | POA: Diagnosis not present

## 2021-11-12 DIAGNOSIS — E782 Mixed hyperlipidemia: Secondary | ICD-10-CM | POA: Diagnosis not present

## 2021-11-12 DIAGNOSIS — I129 Hypertensive chronic kidney disease with stage 1 through stage 4 chronic kidney disease, or unspecified chronic kidney disease: Secondary | ICD-10-CM | POA: Diagnosis not present

## 2021-11-17 DIAGNOSIS — M79605 Pain in left leg: Secondary | ICD-10-CM | POA: Diagnosis not present

## 2021-11-17 DIAGNOSIS — M79604 Pain in right leg: Secondary | ICD-10-CM | POA: Diagnosis not present

## 2021-11-17 DIAGNOSIS — R2689 Other abnormalities of gait and mobility: Secondary | ICD-10-CM | POA: Diagnosis not present

## 2021-11-17 DIAGNOSIS — M6281 Muscle weakness (generalized): Secondary | ICD-10-CM | POA: Diagnosis not present

## 2021-11-22 DIAGNOSIS — M79604 Pain in right leg: Secondary | ICD-10-CM | POA: Diagnosis not present

## 2021-11-22 DIAGNOSIS — M79605 Pain in left leg: Secondary | ICD-10-CM | POA: Diagnosis not present

## 2021-11-22 DIAGNOSIS — R2689 Other abnormalities of gait and mobility: Secondary | ICD-10-CM | POA: Diagnosis not present

## 2021-11-22 DIAGNOSIS — M6281 Muscle weakness (generalized): Secondary | ICD-10-CM | POA: Diagnosis not present

## 2021-11-24 DIAGNOSIS — M79604 Pain in right leg: Secondary | ICD-10-CM | POA: Diagnosis not present

## 2021-11-24 DIAGNOSIS — M79605 Pain in left leg: Secondary | ICD-10-CM | POA: Diagnosis not present

## 2021-11-24 DIAGNOSIS — R2689 Other abnormalities of gait and mobility: Secondary | ICD-10-CM | POA: Diagnosis not present

## 2021-11-24 DIAGNOSIS — M6281 Muscle weakness (generalized): Secondary | ICD-10-CM | POA: Diagnosis not present

## 2021-11-29 DIAGNOSIS — M6281 Muscle weakness (generalized): Secondary | ICD-10-CM | POA: Diagnosis not present

## 2021-11-29 DIAGNOSIS — M79604 Pain in right leg: Secondary | ICD-10-CM | POA: Diagnosis not present

## 2021-11-29 DIAGNOSIS — M79605 Pain in left leg: Secondary | ICD-10-CM | POA: Diagnosis not present

## 2021-11-29 DIAGNOSIS — R2689 Other abnormalities of gait and mobility: Secondary | ICD-10-CM | POA: Diagnosis not present

## 2021-12-06 ENCOUNTER — Ambulatory Visit
Admission: RE | Admit: 2021-12-06 | Discharge: 2021-12-06 | Disposition: A | Discharge status: Home / Self Care | Payer: Medicare Other | Source: Ambulatory Visit | Attending: Hematology and Oncology | Admitting: Hematology and Oncology

## 2021-12-06 ENCOUNTER — Other Ambulatory Visit: Payer: Self-pay | Admitting: Hematology and Oncology

## 2021-12-06 DIAGNOSIS — M1991 Primary osteoarthritis, unspecified site: Secondary | ICD-10-CM | POA: Diagnosis not present

## 2021-12-06 DIAGNOSIS — D0512 Intraductal carcinoma in situ of left breast: Secondary | ICD-10-CM

## 2021-12-06 DIAGNOSIS — R682 Dry mouth, unspecified: Secondary | ICD-10-CM | POA: Diagnosis not present

## 2021-12-06 DIAGNOSIS — M2560 Stiffness of unspecified joint, not elsewhere classified: Secondary | ICD-10-CM | POA: Diagnosis not present

## 2021-12-06 DIAGNOSIS — D649 Anemia, unspecified: Secondary | ICD-10-CM | POA: Diagnosis not present

## 2021-12-06 DIAGNOSIS — M353 Polymyalgia rheumatica: Secondary | ICD-10-CM | POA: Diagnosis not present

## 2021-12-06 DIAGNOSIS — R928 Other abnormal and inconclusive findings on diagnostic imaging of breast: Secondary | ICD-10-CM | POA: Diagnosis not present

## 2021-12-06 DIAGNOSIS — Z79899 Other long term (current) drug therapy: Secondary | ICD-10-CM | POA: Diagnosis not present

## 2021-12-12 DIAGNOSIS — E782 Mixed hyperlipidemia: Secondary | ICD-10-CM | POA: Diagnosis not present

## 2021-12-12 DIAGNOSIS — I129 Hypertensive chronic kidney disease with stage 1 through stage 4 chronic kidney disease, or unspecified chronic kidney disease: Secondary | ICD-10-CM | POA: Diagnosis not present

## 2021-12-14 DIAGNOSIS — I700 Atherosclerosis of aorta: Secondary | ICD-10-CM | POA: Diagnosis not present

## 2021-12-14 DIAGNOSIS — E782 Mixed hyperlipidemia: Secondary | ICD-10-CM | POA: Diagnosis not present

## 2021-12-14 DIAGNOSIS — D649 Anemia, unspecified: Secondary | ICD-10-CM | POA: Diagnosis not present

## 2021-12-14 DIAGNOSIS — Z0000 Encounter for general adult medical examination without abnormal findings: Secondary | ICD-10-CM | POA: Diagnosis not present

## 2021-12-14 DIAGNOSIS — M353 Polymyalgia rheumatica: Secondary | ICD-10-CM | POA: Diagnosis not present

## 2021-12-14 DIAGNOSIS — Z139 Encounter for screening, unspecified: Secondary | ICD-10-CM | POA: Diagnosis not present

## 2021-12-14 DIAGNOSIS — Z23 Encounter for immunization: Secondary | ICD-10-CM | POA: Diagnosis not present

## 2021-12-14 DIAGNOSIS — Z136 Encounter for screening for cardiovascular disorders: Secondary | ICD-10-CM | POA: Diagnosis not present

## 2022-01-02 DIAGNOSIS — I700 Atherosclerosis of aorta: Secondary | ICD-10-CM | POA: Diagnosis not present

## 2022-01-02 DIAGNOSIS — K222 Esophageal obstruction: Secondary | ICD-10-CM | POA: Diagnosis not present

## 2022-01-02 DIAGNOSIS — D649 Anemia, unspecified: Secondary | ICD-10-CM | POA: Diagnosis not present

## 2022-01-02 DIAGNOSIS — Z952 Presence of prosthetic heart valve: Secondary | ICD-10-CM | POA: Diagnosis not present

## 2022-01-02 DIAGNOSIS — K922 Gastrointestinal hemorrhage, unspecified: Secondary | ICD-10-CM | POA: Diagnosis not present

## 2022-01-02 DIAGNOSIS — K649 Unspecified hemorrhoids: Secondary | ICD-10-CM | POA: Diagnosis not present

## 2022-01-02 DIAGNOSIS — D509 Iron deficiency anemia, unspecified: Secondary | ICD-10-CM | POA: Diagnosis not present

## 2022-01-02 DIAGNOSIS — D62 Acute posthemorrhagic anemia: Secondary | ICD-10-CM | POA: Diagnosis not present

## 2022-01-02 DIAGNOSIS — I493 Ventricular premature depolarization: Secondary | ICD-10-CM | POA: Diagnosis not present

## 2022-01-02 DIAGNOSIS — Z7901 Long term (current) use of anticoagulants: Secondary | ICD-10-CM | POA: Diagnosis not present

## 2022-01-02 DIAGNOSIS — K219 Gastro-esophageal reflux disease without esophagitis: Secondary | ICD-10-CM | POA: Insufficient documentation

## 2022-01-02 DIAGNOSIS — Z86718 Personal history of other venous thrombosis and embolism: Secondary | ICD-10-CM

## 2022-01-02 DIAGNOSIS — M353 Polymyalgia rheumatica: Secondary | ICD-10-CM

## 2022-01-02 DIAGNOSIS — R0602 Shortness of breath: Secondary | ICD-10-CM | POA: Diagnosis not present

## 2022-01-02 DIAGNOSIS — I10 Essential (primary) hypertension: Secondary | ICD-10-CM | POA: Diagnosis not present

## 2022-01-02 DIAGNOSIS — E538 Deficiency of other specified B group vitamins: Secondary | ICD-10-CM | POA: Diagnosis not present

## 2022-01-02 DIAGNOSIS — K648 Other hemorrhoids: Secondary | ICD-10-CM | POA: Diagnosis not present

## 2022-01-02 DIAGNOSIS — R000 Tachycardia, unspecified: Secondary | ICD-10-CM | POA: Diagnosis not present

## 2022-01-02 DIAGNOSIS — K5730 Diverticulosis of large intestine without perforation or abscess without bleeding: Secondary | ICD-10-CM | POA: Diagnosis not present

## 2022-01-02 DIAGNOSIS — I499 Cardiac arrhythmia, unspecified: Secondary | ICD-10-CM | POA: Diagnosis not present

## 2022-01-02 DIAGNOSIS — K921 Melena: Secondary | ICD-10-CM | POA: Diagnosis not present

## 2022-01-02 DIAGNOSIS — R519 Headache, unspecified: Secondary | ICD-10-CM | POA: Diagnosis not present

## 2022-01-02 DIAGNOSIS — Z743 Need for continuous supervision: Secondary | ICD-10-CM | POA: Diagnosis not present

## 2022-01-02 HISTORY — DX: Polymyalgia rheumatica: M35.3

## 2022-01-02 HISTORY — DX: Personal history of other venous thrombosis and embolism: Z86.718

## 2022-01-03 DIAGNOSIS — K648 Other hemorrhoids: Secondary | ICD-10-CM | POA: Diagnosis not present

## 2022-01-03 DIAGNOSIS — R519 Headache, unspecified: Secondary | ICD-10-CM | POA: Diagnosis not present

## 2022-01-03 DIAGNOSIS — K649 Unspecified hemorrhoids: Secondary | ICD-10-CM | POA: Diagnosis not present

## 2022-01-03 DIAGNOSIS — M353 Polymyalgia rheumatica: Secondary | ICD-10-CM | POA: Diagnosis not present

## 2022-01-03 DIAGNOSIS — Z952 Presence of prosthetic heart valve: Secondary | ICD-10-CM | POA: Diagnosis not present

## 2022-01-03 DIAGNOSIS — D62 Acute posthemorrhagic anemia: Secondary | ICD-10-CM | POA: Diagnosis not present

## 2022-01-03 DIAGNOSIS — D509 Iron deficiency anemia, unspecified: Secondary | ICD-10-CM

## 2022-01-03 DIAGNOSIS — E538 Deficiency of other specified B group vitamins: Secondary | ICD-10-CM

## 2022-01-03 DIAGNOSIS — K222 Esophageal obstruction: Secondary | ICD-10-CM | POA: Diagnosis not present

## 2022-01-03 DIAGNOSIS — Z86718 Personal history of other venous thrombosis and embolism: Secondary | ICD-10-CM | POA: Diagnosis not present

## 2022-01-03 DIAGNOSIS — K5730 Diverticulosis of large intestine without perforation or abscess without bleeding: Secondary | ICD-10-CM | POA: Diagnosis not present

## 2022-01-03 DIAGNOSIS — Z7901 Long term (current) use of anticoagulants: Secondary | ICD-10-CM | POA: Diagnosis not present

## 2022-01-03 DIAGNOSIS — K921 Melena: Secondary | ICD-10-CM | POA: Diagnosis not present

## 2022-01-03 DIAGNOSIS — K922 Gastrointestinal hemorrhage, unspecified: Secondary | ICD-10-CM | POA: Diagnosis not present

## 2022-01-03 DIAGNOSIS — K219 Gastro-esophageal reflux disease without esophagitis: Secondary | ICD-10-CM | POA: Diagnosis not present

## 2022-01-03 DIAGNOSIS — I10 Essential (primary) hypertension: Secondary | ICD-10-CM | POA: Diagnosis not present

## 2022-01-03 DIAGNOSIS — I499 Cardiac arrhythmia, unspecified: Secondary | ICD-10-CM | POA: Diagnosis not present

## 2022-01-03 DIAGNOSIS — R000 Tachycardia, unspecified: Secondary | ICD-10-CM | POA: Diagnosis not present

## 2022-01-03 HISTORY — DX: Iron deficiency anemia, unspecified: D50.9

## 2022-01-03 HISTORY — DX: Deficiency of other specified B group vitamins: E53.8

## 2022-01-04 DIAGNOSIS — E538 Deficiency of other specified B group vitamins: Secondary | ICD-10-CM | POA: Diagnosis not present

## 2022-01-04 DIAGNOSIS — K219 Gastro-esophageal reflux disease without esophagitis: Secondary | ICD-10-CM | POA: Diagnosis not present

## 2022-01-04 DIAGNOSIS — M353 Polymyalgia rheumatica: Secondary | ICD-10-CM | POA: Diagnosis not present

## 2022-01-04 DIAGNOSIS — K5730 Diverticulosis of large intestine without perforation or abscess without bleeding: Secondary | ICD-10-CM | POA: Diagnosis not present

## 2022-01-04 DIAGNOSIS — K222 Esophageal obstruction: Secondary | ICD-10-CM | POA: Diagnosis not present

## 2022-01-04 DIAGNOSIS — D62 Acute posthemorrhagic anemia: Secondary | ICD-10-CM | POA: Diagnosis not present

## 2022-01-04 DIAGNOSIS — D509 Iron deficiency anemia, unspecified: Secondary | ICD-10-CM | POA: Diagnosis not present

## 2022-01-04 DIAGNOSIS — D500 Iron deficiency anemia secondary to blood loss (chronic): Secondary | ICD-10-CM | POA: Diagnosis not present

## 2022-01-04 DIAGNOSIS — R519 Headache, unspecified: Secondary | ICD-10-CM | POA: Diagnosis not present

## 2022-01-04 DIAGNOSIS — I10 Essential (primary) hypertension: Secondary | ICD-10-CM | POA: Diagnosis not present

## 2022-01-04 DIAGNOSIS — R1319 Other dysphagia: Secondary | ICD-10-CM | POA: Diagnosis not present

## 2022-01-04 DIAGNOSIS — K649 Unspecified hemorrhoids: Secondary | ICD-10-CM | POA: Diagnosis not present

## 2022-01-04 DIAGNOSIS — Z86718 Personal history of other venous thrombosis and embolism: Secondary | ICD-10-CM | POA: Diagnosis not present

## 2022-01-04 DIAGNOSIS — R000 Tachycardia, unspecified: Secondary | ICD-10-CM | POA: Diagnosis not present

## 2022-01-04 DIAGNOSIS — K921 Melena: Secondary | ICD-10-CM | POA: Diagnosis not present

## 2022-01-04 DIAGNOSIS — Z952 Presence of prosthetic heart valve: Secondary | ICD-10-CM | POA: Diagnosis not present

## 2022-01-04 DIAGNOSIS — K648 Other hemorrhoids: Secondary | ICD-10-CM | POA: Diagnosis not present

## 2022-01-04 DIAGNOSIS — K922 Gastrointestinal hemorrhage, unspecified: Secondary | ICD-10-CM | POA: Diagnosis not present

## 2022-01-04 DIAGNOSIS — Z7901 Long term (current) use of anticoagulants: Secondary | ICD-10-CM | POA: Diagnosis not present

## 2022-01-06 DIAGNOSIS — D500 Iron deficiency anemia secondary to blood loss (chronic): Secondary | ICD-10-CM | POA: Diagnosis not present

## 2022-01-06 DIAGNOSIS — Z8719 Personal history of other diseases of the digestive system: Secondary | ICD-10-CM | POA: Diagnosis not present

## 2022-01-06 DIAGNOSIS — Z7901 Long term (current) use of anticoagulants: Secondary | ICD-10-CM | POA: Diagnosis not present

## 2022-01-06 DIAGNOSIS — Z7689 Persons encountering health services in other specified circumstances: Secondary | ICD-10-CM | POA: Diagnosis not present

## 2022-01-08 ENCOUNTER — Emergency Department (HOSPITAL_COMMUNITY): Payer: Medicare Other

## 2022-01-08 ENCOUNTER — Inpatient Hospital Stay (HOSPITAL_COMMUNITY)
Admission: EM | Admit: 2022-01-08 | Discharge: 2022-01-12 | DRG: 552 | Disposition: A | Discharge status: Home Health | Payer: Medicare Other | Attending: Surgery | Admitting: Surgery

## 2022-01-08 ENCOUNTER — Encounter (HOSPITAL_COMMUNITY): Payer: Self-pay

## 2022-01-08 ENCOUNTER — Other Ambulatory Visit: Payer: Self-pay

## 2022-01-08 DIAGNOSIS — D0512 Intraductal carcinoma in situ of left breast: Secondary | ICD-10-CM | POA: Diagnosis not present

## 2022-01-08 DIAGNOSIS — K219 Gastro-esophageal reflux disease without esophagitis: Secondary | ICD-10-CM | POA: Diagnosis present

## 2022-01-08 DIAGNOSIS — M48061 Spinal stenosis, lumbar region without neurogenic claudication: Secondary | ICD-10-CM | POA: Diagnosis not present

## 2022-01-08 DIAGNOSIS — M545 Low back pain, unspecified: Secondary | ICD-10-CM | POA: Diagnosis not present

## 2022-01-08 DIAGNOSIS — Z7409 Other reduced mobility: Secondary | ICD-10-CM | POA: Diagnosis present

## 2022-01-08 DIAGNOSIS — G8929 Other chronic pain: Secondary | ICD-10-CM | POA: Diagnosis present

## 2022-01-08 DIAGNOSIS — S3993XA Unspecified injury of pelvis, initial encounter: Secondary | ICD-10-CM | POA: Diagnosis not present

## 2022-01-08 DIAGNOSIS — I130 Hypertensive heart and chronic kidney disease with heart failure and stage 1 through stage 4 chronic kidney disease, or unspecified chronic kidney disease: Secondary | ICD-10-CM | POA: Diagnosis present

## 2022-01-08 DIAGNOSIS — Z885 Allergy status to narcotic agent status: Secondary | ICD-10-CM | POA: Diagnosis not present

## 2022-01-08 DIAGNOSIS — Z79810 Long term (current) use of selective estrogen receptor modulators (SERMs): Secondary | ICD-10-CM | POA: Diagnosis not present

## 2022-01-08 DIAGNOSIS — S299XXA Unspecified injury of thorax, initial encounter: Secondary | ICD-10-CM | POA: Diagnosis not present

## 2022-01-08 DIAGNOSIS — Z86711 Personal history of pulmonary embolism: Secondary | ICD-10-CM | POA: Diagnosis not present

## 2022-01-08 DIAGNOSIS — I10 Essential (primary) hypertension: Secondary | ICD-10-CM | POA: Diagnosis not present

## 2022-01-08 DIAGNOSIS — E782 Mixed hyperlipidemia: Secondary | ICD-10-CM | POA: Diagnosis not present

## 2022-01-08 DIAGNOSIS — Y92511 Restaurant or cafe as the place of occurrence of the external cause: Secondary | ICD-10-CM | POA: Diagnosis not present

## 2022-01-08 DIAGNOSIS — Z7901 Long term (current) use of anticoagulants: Secondary | ICD-10-CM | POA: Diagnosis not present

## 2022-01-08 DIAGNOSIS — Z743 Need for continuous supervision: Secondary | ICD-10-CM | POA: Diagnosis not present

## 2022-01-08 DIAGNOSIS — S0990XA Unspecified injury of head, initial encounter: Secondary | ICD-10-CM | POA: Diagnosis not present

## 2022-01-08 DIAGNOSIS — M549 Dorsalgia, unspecified: Secondary | ICD-10-CM | POA: Diagnosis not present

## 2022-01-08 DIAGNOSIS — M353 Polymyalgia rheumatica: Secondary | ICD-10-CM | POA: Diagnosis not present

## 2022-01-08 DIAGNOSIS — N183 Chronic kidney disease, stage 3 unspecified: Secondary | ICD-10-CM | POA: Diagnosis present

## 2022-01-08 DIAGNOSIS — Z953 Presence of xenogenic heart valve: Secondary | ICD-10-CM | POA: Diagnosis not present

## 2022-01-08 DIAGNOSIS — M4316 Spondylolisthesis, lumbar region: Secondary | ICD-10-CM | POA: Diagnosis not present

## 2022-01-08 DIAGNOSIS — M5459 Other low back pain: Secondary | ICD-10-CM | POA: Diagnosis not present

## 2022-01-08 DIAGNOSIS — I129 Hypertensive chronic kidney disease with stage 1 through stage 4 chronic kidney disease, or unspecified chronic kidney disease: Secondary | ICD-10-CM | POA: Diagnosis not present

## 2022-01-08 DIAGNOSIS — Z880 Allergy status to penicillin: Secondary | ICD-10-CM

## 2022-01-08 DIAGNOSIS — I5032 Chronic diastolic (congestive) heart failure: Secondary | ICD-10-CM | POA: Diagnosis not present

## 2022-01-08 DIAGNOSIS — R6889 Other general symptoms and signs: Secondary | ICD-10-CM | POA: Diagnosis not present

## 2022-01-08 DIAGNOSIS — Z86718 Personal history of other venous thrombosis and embolism: Secondary | ICD-10-CM

## 2022-01-08 DIAGNOSIS — R079 Chest pain, unspecified: Secondary | ICD-10-CM | POA: Diagnosis not present

## 2022-01-08 DIAGNOSIS — T1490XA Injury, unspecified, initial encounter: Secondary | ICD-10-CM | POA: Diagnosis not present

## 2022-01-08 DIAGNOSIS — I517 Cardiomegaly: Secondary | ICD-10-CM | POA: Diagnosis not present

## 2022-01-08 DIAGNOSIS — I499 Cardiac arrhythmia, unspecified: Secondary | ICD-10-CM | POA: Diagnosis not present

## 2022-01-08 DIAGNOSIS — I878 Other specified disorders of veins: Secondary | ICD-10-CM | POA: Diagnosis not present

## 2022-01-08 DIAGNOSIS — R531 Weakness: Secondary | ICD-10-CM | POA: Diagnosis not present

## 2022-01-08 DIAGNOSIS — M5136 Other intervertebral disc degeneration, lumbar region: Secondary | ICD-10-CM | POA: Diagnosis not present

## 2022-01-08 LAB — I-STAT CHEM 8, ED
BUN: 21 mg/dL (ref 8–23)
Calcium, Ion: 1.09 mmol/L — ABNORMAL LOW (ref 1.15–1.40)
Chloride: 111 mmol/L (ref 98–111)
Creatinine, Ser: 1.2 mg/dL — ABNORMAL HIGH (ref 0.44–1.00)
Glucose, Bld: 111 mg/dL — ABNORMAL HIGH (ref 70–99)
HCT: 31 % — ABNORMAL LOW (ref 36.0–46.0)
Hemoglobin: 10.5 g/dL — ABNORMAL LOW (ref 12.0–15.0)
Potassium: 3.5 mmol/L (ref 3.5–5.1)
Sodium: 143 mmol/L (ref 135–145)
TCO2: 21 mmol/L — ABNORMAL LOW (ref 22–32)

## 2022-01-08 LAB — CBC
HCT: 34.9 % — ABNORMAL LOW (ref 36.0–46.0)
Hemoglobin: 10.1 g/dL — ABNORMAL LOW (ref 12.0–15.0)
MCH: 25.6 pg — ABNORMAL LOW (ref 26.0–34.0)
MCHC: 28.9 g/dL — ABNORMAL LOW (ref 30.0–36.0)
MCV: 88.6 fL (ref 80.0–100.0)
Platelets: 134 10*3/uL — ABNORMAL LOW (ref 150–400)
RBC: 3.94 MIL/uL (ref 3.87–5.11)
RDW: 17.2 % — ABNORMAL HIGH (ref 11.5–15.5)
WBC: 6.8 10*3/uL (ref 4.0–10.5)
nRBC: 0 % (ref 0.0–0.2)

## 2022-01-08 LAB — COMPREHENSIVE METABOLIC PANEL
ALT: 14 U/L (ref 0–44)
AST: 21 U/L (ref 15–41)
Albumin: 3.5 g/dL (ref 3.5–5.0)
Alkaline Phosphatase: 39 U/L (ref 38–126)
Anion gap: 10 (ref 5–15)
BUN: 21 mg/dL (ref 8–23)
CO2: 22 mmol/L (ref 22–32)
Calcium: 8.9 mg/dL (ref 8.9–10.3)
Chloride: 110 mmol/L (ref 98–111)
Creatinine, Ser: 1.21 mg/dL — ABNORMAL HIGH (ref 0.44–1.00)
GFR, Estimated: 46 mL/min — ABNORMAL LOW (ref >60)
Glucose, Bld: 116 mg/dL — ABNORMAL HIGH (ref 70–99)
Potassium: 3.5 mmol/L (ref 3.5–5.1)
Sodium: 142 mmol/L (ref 135–145)
Total Bilirubin: 0.5 mg/dL (ref 0.3–1.2)
Total Protein: 6.2 g/dL — ABNORMAL LOW (ref 6.5–8.1)

## 2022-01-08 LAB — PROTIME-INR
INR: 1.7 — ABNORMAL HIGH (ref 0.8–1.2)
Prothrombin Time: 20.1 seconds — ABNORMAL HIGH (ref 11.4–15.2)

## 2022-01-08 LAB — ETHANOL: Alcohol, Ethyl (B): <10 mg/dL (ref <10)

## 2022-01-08 LAB — LACTIC ACID, PLASMA: Lactic Acid, Venous: 3.1 mmol/L (ref 0.5–1.9)

## 2022-01-08 MED ORDER — FENTANYL CITRATE PF 50 MCG/ML IJ SOSY
50.0000 ug | PREFILLED_SYRINGE | Freq: Once | INTRAMUSCULAR | Status: AC
  Administered 2022-01-08: 50 ug via INTRAVENOUS
  Filled 2022-01-08: qty 1

## 2022-01-08 MED ORDER — IOHEXOL 350 MG/ML SOLN
100.0000 mL | Freq: Once | INTRAVENOUS | Status: AC | PRN
  Administered 2022-01-08: 100 mL via INTRAVENOUS

## 2022-01-08 MED ORDER — LORAZEPAM 2 MG/ML IJ SOLN
1.0000 mg | Freq: Once | INTRAMUSCULAR | Status: AC | PRN
  Administered 2022-01-08: 1 mg via INTRAVENOUS
  Filled 2022-01-08: qty 1

## 2022-01-08 MED ORDER — LORAZEPAM 2 MG/ML IJ SOLN: 1.0000 mg | Freq: Once | INTRAMUSCULAR | Status: DC

## 2022-01-08 MED ORDER — HYDROMORPHONE HCL 1 MG/ML IJ SOLN
1.0000 mg | Freq: Once | INTRAMUSCULAR | Status: AC
  Administered 2022-01-08: 1 mg via INTRAVENOUS
  Filled 2022-01-08: qty 1

## 2022-01-08 MED ORDER — SODIUM CHLORIDE 0.9 % IV BOLUS
1000.0000 mL | Freq: Once | INTRAVENOUS | Status: AC
  Administered 2022-01-08: 1000 mL via INTRAVENOUS

## 2022-01-08 NOTE — Progress Notes (Signed)
Orthopedic Tech Progress Note Patient Details:  Unice Vantassel 1945/08/04 [id number]   Level 2 trauma Patient ID: [name], female   DOB: 01-21-1946, 76 y.o.   MRN: [id number]  Carin Primrose 01/08/2022, 7:21 PM
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
## 2022-01-08 NOTE — ED Notes (Signed)
Patient transported to MRI 

## 2022-01-08 NOTE — ED Notes (Signed)
Patient transported to CT 

## 2022-01-08 NOTE — ED Notes (Signed)
Jewelry(necklaces, bracelets, earings)  and watch put in a lab bag inside of a patient belonging bag. Patient sticker placed on both bags.

## 2022-01-08 NOTE — ED Notes (Signed)
Date and time results received: 01/08/22 2015 (use smartphrase ".now" to insert current time)  Test: Lactic Acid Critical Value: 3.1  Name of Provider Notified: MD Trifan  Orders Received? Or Actions Taken?: Fluids and rpt lactic after bolus
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
## 2022-01-08 NOTE — ED Notes (Signed)
EDP at bedside  

## 2022-01-09 DIAGNOSIS — M5459 Other low back pain: Secondary | ICD-10-CM | POA: Diagnosis not present

## 2022-01-09 DIAGNOSIS — Z7409 Other reduced mobility: Secondary | ICD-10-CM | POA: Diagnosis present

## 2022-01-09 HISTORY — DX: Other reduced mobility: Z74.09

## 2022-01-09 LAB — URINALYSIS, ROUTINE W REFLEX MICROSCOPIC
Bilirubin Urine: NEGATIVE
Glucose, UA: NEGATIVE mg/dL
Hgb urine dipstick: NEGATIVE
Ketones, ur: NEGATIVE mg/dL
Leukocytes,Ua: NEGATIVE
Nitrite: NEGATIVE
Protein, ur: NEGATIVE mg/dL
Specific Gravity, Urine: 1.034 — ABNORMAL HIGH (ref 1.005–1.030)
pH: 6 (ref 5.0–8.0)

## 2022-01-09 LAB — CBC
HCT: 30.5 % — ABNORMAL LOW (ref 36.0–46.0)
Hemoglobin: 8.8 g/dL — ABNORMAL LOW (ref 12.0–15.0)
MCH: 25.4 pg — ABNORMAL LOW (ref 26.0–34.0)
MCHC: 28.9 g/dL — ABNORMAL LOW (ref 30.0–36.0)
MCV: 87.9 fL (ref 80.0–100.0)
Platelets: 127 10*3/uL — ABNORMAL LOW (ref 150–400)
RBC: 3.47 MIL/uL — ABNORMAL LOW (ref 3.87–5.11)
RDW: 17.2 % — ABNORMAL HIGH (ref 11.5–15.5)
WBC: 5.3 10*3/uL (ref 4.0–10.5)
nRBC: 0 % (ref 0.0–0.2)

## 2022-01-09 LAB — CREATININE, SERUM
Creatinine, Ser: 1.11 mg/dL — ABNORMAL HIGH (ref 0.44–1.00)
GFR, Estimated: 52 mL/min — ABNORMAL LOW (ref >60)

## 2022-01-09 MED ORDER — ONDANSETRON HCL 4 MG/2ML IJ SOLN
4.0000 mg | Freq: Four times a day (QID) | INTRAMUSCULAR | Status: DC | PRN
  Administered 2022-01-09 – 2022-01-11 (×5): 4 mg via INTRAVENOUS
  Filled 2022-01-09 (×5): qty 2

## 2022-01-09 MED ORDER — METOPROLOL TARTRATE 5 MG/5ML IV SOLN: 5.0000 mg | Freq: Four times a day (QID) | INTRAVENOUS | Status: DC | PRN

## 2022-01-09 MED ORDER — ONDANSETRON 4 MG PO TBDP: 4.0000 mg | ORAL_TABLET | Freq: Four times a day (QID) | ORAL | Status: DC | PRN

## 2022-01-09 MED ORDER — ACETAMINOPHEN 325 MG PO TABS
650.0000 mg | ORAL_TABLET | ORAL | Status: DC | PRN
  Administered 2022-01-10: 650 mg via ORAL
  Filled 2022-01-09: qty 2

## 2022-01-09 MED ORDER — OXYCODONE HCL 5 MG PO TABS
5.0000 mg | ORAL_TABLET | ORAL | Status: DC | PRN
  Administered 2022-01-10 (×3): 5 mg via ORAL
  Filled 2022-01-09 (×3): qty 1

## 2022-01-09 MED ORDER — LIDOCAINE 5 % EX PTCH
1.0000 | MEDICATED_PATCH | CUTANEOUS | Status: DC
  Administered 2022-01-09 – 2022-01-12 (×4): 1 via TRANSDERMAL
  Filled 2022-01-09 (×4): qty 1

## 2022-01-09 MED ORDER — HYDROCODONE-ACETAMINOPHEN 5-325 MG PO TABS
1.0000 | ORAL_TABLET | ORAL | Status: DC | PRN
  Administered 2022-01-09 – 2022-01-11 (×6): 1 via ORAL
  Filled 2022-01-09 (×6): qty 1

## 2022-01-09 MED ORDER — ENOXAPARIN SODIUM 30 MG/0.3ML IJ SOSY
30.0000 mg | PREFILLED_SYRINGE | Freq: Two times a day (BID) | INTRAMUSCULAR | Status: DC
  Administered 2022-01-10 – 2022-01-12 (×5): 30 mg via SUBCUTANEOUS
  Filled 2022-01-09 (×5): qty 0.3

## 2022-01-09 MED ORDER — MORPHINE SULFATE (PF) 2 MG/ML IV SOLN
2.0000 mg | INTRAVENOUS | Status: DC | PRN
  Administered 2022-01-09 (×3): 4 mg via INTRAVENOUS
  Administered 2022-01-09 – 2022-01-10 (×3): 2 mg via INTRAVENOUS
  Administered 2022-01-10 – 2022-01-11 (×2): 4 mg via INTRAVENOUS
  Filled 2022-01-09: qty 1
  Filled 2022-01-09 (×4): qty 2
  Filled 2022-01-09: qty 1
  Filled 2022-01-09 (×2): qty 2
  Filled 2022-01-09: qty 1

## 2022-01-09 MED ORDER — ONDANSETRON HCL 4 MG/2ML IJ SOLN
4.0000 mg | Freq: Once | INTRAMUSCULAR | Status: AC
  Administered 2022-01-09: 4 mg via INTRAVENOUS
  Filled 2022-01-09: qty 2

## 2022-01-09 NOTE — Discharge Instructions (Signed)
Follow up with previous/established spine surgeon - this was advised per our Neurosurgery team here
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
## 2022-01-09 NOTE — ED Notes (Signed)
Patient assisted onto a bed pan, was able to turn to her sides. Tolerated well

## 2022-01-10 ENCOUNTER — Inpatient Hospital Stay (HOSPITAL_COMMUNITY): Payer: Medicare Other

## 2022-01-10 DIAGNOSIS — G8929 Other chronic pain: Secondary | ICD-10-CM | POA: Diagnosis present

## 2022-01-10 DIAGNOSIS — I129 Hypertensive chronic kidney disease with stage 1 through stage 4 chronic kidney disease, or unspecified chronic kidney disease: Secondary | ICD-10-CM | POA: Diagnosis not present

## 2022-01-10 DIAGNOSIS — N183 Chronic kidney disease, stage 3 unspecified: Secondary | ICD-10-CM | POA: Diagnosis present

## 2022-01-10 DIAGNOSIS — I130 Hypertensive heart and chronic kidney disease with heart failure and stage 1 through stage 4 chronic kidney disease, or unspecified chronic kidney disease: Secondary | ICD-10-CM | POA: Diagnosis present

## 2022-01-10 DIAGNOSIS — I5032 Chronic diastolic (congestive) heart failure: Secondary | ICD-10-CM | POA: Diagnosis present

## 2022-01-10 DIAGNOSIS — Z86711 Personal history of pulmonary embolism: Secondary | ICD-10-CM | POA: Diagnosis not present

## 2022-01-10 DIAGNOSIS — Z79810 Long term (current) use of selective estrogen receptor modulators (SERMs): Secondary | ICD-10-CM | POA: Diagnosis not present

## 2022-01-10 DIAGNOSIS — Z86718 Personal history of other venous thrombosis and embolism: Secondary | ICD-10-CM

## 2022-01-10 DIAGNOSIS — Z7409 Other reduced mobility: Secondary | ICD-10-CM | POA: Diagnosis present

## 2022-01-10 DIAGNOSIS — Y92511 Restaurant or cafe as the place of occurrence of the external cause: Secondary | ICD-10-CM | POA: Diagnosis not present

## 2022-01-10 DIAGNOSIS — M353 Polymyalgia rheumatica: Secondary | ICD-10-CM | POA: Diagnosis present

## 2022-01-10 DIAGNOSIS — Z885 Allergy status to narcotic agent status: Secondary | ICD-10-CM | POA: Diagnosis not present

## 2022-01-10 DIAGNOSIS — Z953 Presence of xenogenic heart valve: Secondary | ICD-10-CM | POA: Diagnosis not present

## 2022-01-10 DIAGNOSIS — D0512 Intraductal carcinoma in situ of left breast: Secondary | ICD-10-CM | POA: Diagnosis present

## 2022-01-10 DIAGNOSIS — E782 Mixed hyperlipidemia: Secondary | ICD-10-CM | POA: Diagnosis not present

## 2022-01-10 DIAGNOSIS — K219 Gastro-esophageal reflux disease without esophagitis: Secondary | ICD-10-CM | POA: Diagnosis present

## 2022-01-10 DIAGNOSIS — Z880 Allergy status to penicillin: Secondary | ICD-10-CM | POA: Diagnosis not present

## 2022-01-10 DIAGNOSIS — M545 Low back pain, unspecified: Secondary | ICD-10-CM | POA: Diagnosis present

## 2022-01-10 DIAGNOSIS — Z7901 Long term (current) use of anticoagulants: Secondary | ICD-10-CM | POA: Diagnosis not present

## 2022-01-10 MED ORDER — TAMOXIFEN CITRATE 10 MG PO TABS
20.0000 mg | ORAL_TABLET | Freq: Every day | ORAL | Status: DC
  Administered 2022-01-10 – 2022-01-12 (×3): 20 mg via ORAL
  Filled 2022-01-10 (×3): qty 2

## 2022-01-10 MED ORDER — AMLODIPINE BESYLATE 5 MG PO TABS
5.0000 mg | ORAL_TABLET | Freq: Every day | ORAL | Status: DC
  Administered 2022-01-10 – 2022-01-12 (×2): 5 mg via ORAL
  Filled 2022-01-10 (×3): qty 1

## 2022-01-10 MED ORDER — PANTOPRAZOLE SODIUM 40 MG PO TBEC
40.0000 mg | DELAYED_RELEASE_TABLET | Freq: Every day | ORAL | Status: DC
  Administered 2022-01-10 – 2022-01-12 (×3): 40 mg via ORAL
  Filled 2022-01-10 (×3): qty 1

## 2022-01-10 MED ORDER — PREDNISONE 5 MG PO TABS
15.0000 mg | ORAL_TABLET | Freq: Every day | ORAL | Status: DC
  Administered 2022-01-11 – 2022-01-12 (×2): 15 mg via ORAL
  Filled 2022-01-10 (×2): qty 1

## 2022-01-10 MED ORDER — FERROUS SULFATE 325 (65 FE) MG PO TABS
325.0000 mg | ORAL_TABLET | Freq: Every morning | ORAL | Status: DC
  Administered 2022-01-10 – 2022-01-12 (×3): 325 mg via ORAL
  Filled 2022-01-10 (×3): qty 1

## 2022-01-10 MED ORDER — CARVEDILOL 6.25 MG PO TABS
6.2500 mg | ORAL_TABLET | Freq: Two times a day (BID) | ORAL | Status: DC
  Administered 2022-01-10 – 2022-01-12 (×4): 6.25 mg via ORAL
  Filled 2022-01-10 (×4): qty 1

## 2022-01-10 NOTE — Progress Notes (Signed)
Transition of Care [facility]) - CAGE-AID Screening   Patient Details  Name: Elizabeth [name] MRN: [id number] Date of Birth: 1945-12-09   Elvina Sidle, RN Trauma Response Nurse Phone Number: [phone] 01/10/2022, 4:00 PM       CAGE-AID Screening:    Have You Ever Felt You Ought to Cut Down on Your Drinking or Drug Use?: No Have People Annoyed You By Critizing Your Drinking Or Drug Use?: No Have You Felt Bad Or Guilty About Your Drinking Or Drug Use?: No Have You Ever Had a Drink or Used Drugs First Thing In The Morning to Steady Your Nerves or to Get Rid of a Hangover?: No CAGE-AID Score: 0  Substance Abuse Education Offered: No (denies any alcohol use)

## 2022-01-10 NOTE — Progress Notes (Signed)
VASCULAR LAB    Bilateral lower extremity venous duplex has been performed.  See CV proc for preliminary results.   Randie Tallarico, RVT 01/10/2022, 3:18 PM
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
## 2022-01-10 NOTE — Plan of Care (Signed)
  Problem: Education: Goal: Knowledge of General Education information will improve Description Including pain rating scale, medication(s)/side effects and non-pharmacologic comfort measures Outcome: Progressing   Problem: Health Behavior/Discharge Planning: Goal: Ability to manage health-related needs will improve Outcome: Progressing   
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
## 2022-01-11 ENCOUNTER — Other Ambulatory Visit (HOSPITAL_COMMUNITY): Payer: Self-pay

## 2022-01-11 LAB — BASIC METABOLIC PANEL
Anion gap: 11 (ref 5–15)
BUN: 15 mg/dL (ref 8–23)
CO2: 22 mmol/L (ref 22–32)
Calcium: 8.6 mg/dL — ABNORMAL LOW (ref 8.9–10.3)
Chloride: 104 mmol/L (ref 98–111)
Creatinine, Ser: 1.29 mg/dL — ABNORMAL HIGH (ref 0.44–1.00)
GFR, Estimated: 43 mL/min — ABNORMAL LOW (ref >60)
Glucose, Bld: 156 mg/dL — ABNORMAL HIGH (ref 70–99)
Potassium: 4.1 mmol/L (ref 3.5–5.1)
Sodium: 137 mmol/L (ref 135–145)

## 2022-01-11 LAB — CBC
HCT: 32.7 % — ABNORMAL LOW (ref 36.0–46.0)
Hemoglobin: 10 g/dL — ABNORMAL LOW (ref 12.0–15.0)
MCH: 26.2 pg (ref 26.0–34.0)
MCHC: 30.6 g/dL (ref 30.0–36.0)
MCV: 85.6 fL (ref 80.0–100.0)
Platelets: 146 10*3/uL — ABNORMAL LOW (ref 150–400)
RBC: 3.82 MIL/uL — ABNORMAL LOW (ref 3.87–5.11)
RDW: 17.2 % — ABNORMAL HIGH (ref 11.5–15.5)
WBC: 6.1 10*3/uL (ref 4.0–10.5)
nRBC: 0 % (ref 0.0–0.2)

## 2022-01-11 MED ORDER — ACETAMINOPHEN 500 MG PO TABS
1000.0000 mg | ORAL_TABLET | ORAL | Status: DC | PRN
  Administered 2022-01-11 (×2): 1000 mg via ORAL
  Filled 2022-01-11 (×2): qty 2

## 2022-01-11 MED ORDER — METHOCARBAMOL 500 MG PO TABS
500.0000 mg | ORAL_TABLET | Freq: Three times a day (TID) | ORAL | 0 refills | Status: DC | PRN
  Filled 2022-01-11: qty 30, 10d supply, fill #0

## 2022-01-11 MED ORDER — OXYCODONE HCL 5 MG PO TABS
5.0000 mg | ORAL_TABLET | ORAL | Status: DC | PRN
  Administered 2022-01-11 – 2022-01-12 (×4): 5 mg via ORAL
  Filled 2022-01-11 (×4): qty 1

## 2022-01-11 MED ORDER — ACETAMINOPHEN 325 MG PO TABS: 1000.0000 mg | ORAL_TABLET | Freq: Four times a day (QID) | ORAL | Status: DC | PRN

## 2022-01-11 MED ORDER — LIDOCAINE 5 % EX PTCH
1.0000 | MEDICATED_PATCH | CUTANEOUS | 0 refills | Status: DC
  Filled 2022-01-11: qty 14, 14d supply, fill #0

## 2022-01-11 MED ORDER — LACTATED RINGERS IV BOLUS
500.0000 mL | Freq: Once | INTRAVENOUS | Status: AC
  Administered 2022-01-11: 500 mL via INTRAVENOUS

## 2022-01-11 MED ORDER — METHOCARBAMOL 500 MG PO TABS
500.0000 mg | ORAL_TABLET | Freq: Three times a day (TID) | ORAL | Status: DC | PRN
  Administered 2022-01-11: 500 mg via ORAL
  Filled 2022-01-11: qty 1

## 2022-01-11 MED ORDER — OXYCODONE HCL 5 MG PO TABS
5.0000 mg | ORAL_TABLET | Freq: Four times a day (QID) | ORAL | 0 refills | Status: DC | PRN
  Filled 2022-01-11: qty 15, 4d supply, fill #0

## 2022-01-11 MED ORDER — PREDNISONE 5 MG PO TABS
12.5000 mg | ORAL_TABLET | Freq: Every day | ORAL | 0 refills | Status: AC
  Filled 2022-01-11: qty 53, 21d supply, fill #0

## 2022-01-11 NOTE — Progress Notes (Signed)
PT Cancellation Note  Patient Details Name: Elizabeth [name] MRN: [id number] DOB: [birth year]   Cancelled Treatment:    Reason Eval/Treat Not Completed: Other (comment).  Came to walk and practice steps with pt but she is feeling bad, got light headed and wants to wait.  Will see in AM for stair training.   Ramond Dial 01/11/2022, 1:39 PM  Mee Hives, PT PhD Acute Rehab Dept. Number: [place] [facility] [place]

## 2022-01-11 NOTE — Progress Notes (Signed)
   01/11/22 1300  Assess: MEWS Score  Temp 98.7 F (37.1 C)  BP 128/78  Pulse Rate (!) 124  Resp 18  SpO2 94 %  O2 Device Room Air  Assess: MEWS Score  MEWS Temp 0  MEWS Systolic 0  MEWS Pulse 2  MEWS RR 0  MEWS LOC 0  MEWS Score 2  MEWS Score Color Yellow  Assess: if the MEWS score is Yellow or Red  Were vital signs taken at a resting state? Yes  Focused Assessment No change from prior assessment  Does the patient meet 2 or more of the SIRS criteria? No  MEWS guidelines implemented *See Row Information* Yes  Treat  MEWS Interventions Administered prn meds/treatments;Escalated (See documentation below) (PRN zofran given, PA Simaan notified)  Pain Scale 0-10  Pain Score 6  Pain Type Acute pain  Pain Location Back  Pain Orientation Right;Left  Pain Intervention(s) Repositioned  Take Vital Signs  Increase Vital Sign Frequency  Yellow: Q 2hr X 2 then Q 4hr X 2, if remains yellow, continue Q 4hrs  Escalate  MEWS: Escalate Yellow: discuss with charge nurse/RN and consider discussing with provider and RRT  Notify: Charge Nurse/RN  Name of Charge Nurse/RN Notified Sedillo, RN  Date Charge Nurse/RN Notified 01/11/22  Time Charge Nurse/RN Notified 69  Notify: Provider  Provider Name/Title Obie Dredge, PA  Date Provider Notified 01/11/22  Time Provider Notified 1310  Method of Notification Page  Notification Reason Other (Comment) (yellow MEWS)  Provider response No new orders  Assess: SIRS CRITERIA  SIRS Temperature  0  SIRS Pulse 1  SIRS Respirations  0  SIRS WBC 1  SIRS Score Sum  2   Sharee Pimple, Agricultural consultant notified as well as Obie Dredge, PA. Patient was attempting to get from chair to bed when she complained of feeling dizzy and nauseated. PRN zofran given.
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
## 2022-01-11 NOTE — Progress Notes (Signed)
Mobility Specialist - Progress Note   01/11/22 1537  Mobility  Activity Ambulated with assistance in hallway  Level of Assistance Standby assist, set-up cues, supervision of patient - no hands on  Assistive Device Front wheel walker  Distance Ambulated (ft) 100 ft  Activity Response Tolerated well  $Mobility charge 1 Mobility    Pt received in bed agreeable to mobility. No complaints throughout, left in bed w/ call bell in reach and all needs met.   Paulla Dolly Mobility Specialist
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
## 2022-01-12 ENCOUNTER — Other Ambulatory Visit (HOSPITAL_COMMUNITY): Payer: Self-pay

## 2022-01-12 DIAGNOSIS — E782 Mixed hyperlipidemia: Secondary | ICD-10-CM | POA: Diagnosis not present

## 2022-01-12 DIAGNOSIS — I129 Hypertensive chronic kidney disease with stage 1 through stage 4 chronic kidney disease, or unspecified chronic kidney disease: Secondary | ICD-10-CM | POA: Diagnosis not present

## 2022-01-12 NOTE — Progress Notes (Signed)
Mobility Specialist Progress Note   01/12/22 1045  Pain Assessment  Faces Pain Scale 6  Pain Location back  Pain Descriptors / Indicators Guarding;Sore  Pain Intervention(s) Limited activity within patient's tolerance  Mobility  Activity Ambulated with assistance in hallway;Transferred from chair to bed  Level of Assistance Contact guard assist, steadying assist  Assistive Device Front wheel walker  Distance Ambulated (ft) 100 ft  Range of Motion/Exercises Active;All extremities  Activity Response Tolerated well   Patient received in recliner, agreeable to participate with anticipation to be discharged soon. Stood with supervision and ambulated min guard to supervision with slow steady gait.  Distance limited secondary to pain and fatigue. Returned to room without incident but complained of pain in lower back that was more severe while in supine. Was left with all needs met, call bell in reach.   Elizabeth [name], [place], [place]  Office: [phone]
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
## 2022-01-12 NOTE — Plan of Care (Signed)
  Problem: Education: Goal: Knowledge of General Education information will improve Description: Including pain rating scale, medication(s)/side effects and non-pharmacologic comfort measures 01/12/2022 1202 by Santa Lighter, RN Outcome: Adequate for Discharge 01/12/2022 1002 by Santa Lighter, RN Outcome: Progressing   Problem: Health Behavior/Discharge Planning: Goal: Ability to manage health-related needs will improve 01/12/2022 1202 by Santa Lighter, RN Outcome: Adequate for Discharge 01/12/2022 1002 by Santa Lighter, RN Outcome: Progressing   Problem: Clinical Measurements: Goal: Ability to maintain clinical measurements within normal limits will improve Outcome: Adequate for Discharge Goal: Will remain free from infection Outcome: Adequate for Discharge Goal: Diagnostic test results will improve Outcome: Adequate for Discharge Goal: Respiratory complications will improve Outcome: Adequate for Discharge Goal: Cardiovascular complication will be avoided Outcome: Adequate for Discharge   Problem: Activity: Goal: Risk for activity intolerance will decrease Outcome: Adequate for Discharge   Problem: Nutrition: Goal: Adequate nutrition will be maintained Outcome: Adequate for Discharge   Problem: Coping: Goal: Level of anxiety will decrease Outcome: Adequate for Discharge   Problem: Elimination: Goal: Will not experience complications related to bowel motility Outcome: Adequate for Discharge Goal: Will not experience complications related to urinary retention Outcome: Adequate for Discharge   Problem: Pain Managment: Goal: General experience of comfort will improve Outcome: Adequate for Discharge   Problem: Safety: Goal: Ability to remain free from injury will improve Outcome: Adequate for Discharge   Problem: Skin Integrity: Goal: Risk for impaired skin integrity will decrease Outcome: Adequate for Discharge
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
## 2022-01-12 NOTE — Plan of Care (Signed)
  Problem: Education: Goal: Knowledge of General Education information will improve Description Including pain rating scale, medication(s)/side effects and non-pharmacologic comfort measures Outcome: Progressing   Problem: Health Behavior/Discharge Planning: Goal: Ability to manage health-related needs will improve Outcome: Progressing   

## 2022-01-12 NOTE — TOC Transition Note (Signed)
Transition of Care [facility]) - CM/SW Discharge Note   Patient Details  Name: Elizabeth [name] MRN: [id number] Date of Birth: [birth year]  [facility] CM/SW Contact:  Ella Bodo, RN Phone Number: 01/12/2022, 11:54 AM   Clinical Narrative:    Pt medically stable for discharge home today with family to assist with care.  Able to secure HHPT with [facility].  DME has been delivered to room.     Final next level of care: Home w Home Health Services Barriers to Discharge: Barriers Resolved   Patient Goals and CMS Choice   CMS Medicare.gov Compare Post Acute Care list provided to:: Patient Choice offered to / list presented to : Patient                        Discharge Plan and Services   Discharge Planning Services: CM Consult Post Acute Care Choice: Home Health          DME Arranged: Walker rolling DME Agency: [employer] Date DME Agency Contacted: 01/11/22 Time DME Agency Contacted: [phone] Representative spoke with at DME Agency: Pura Spice HH Arranged: PT [place]: [place] Date [place]: 01/12/22 Time [place]: 77 Representative spoke with at [place]: Sharmon Revere  Social Determinants of Health ([place]) Interventions     Readmission Risk Interventions     No data to display         Reinaldo Raddle, RN, BSN  Trauma/Neuro ICU Case Manager [phone]
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
## 2022-01-12 NOTE — Progress Notes (Signed)
Able to walk to bathroom with walker. Back pain of 8/10, PRN med given.

## 2022-02-11 DIAGNOSIS — I129 Hypertensive chronic kidney disease with stage 1 through stage 4 chronic kidney disease, or unspecified chronic kidney disease: Secondary | ICD-10-CM | POA: Diagnosis not present

## 2022-02-11 DIAGNOSIS — E782 Mixed hyperlipidemia: Secondary | ICD-10-CM | POA: Diagnosis not present

## 2022-02-14 DIAGNOSIS — B029 Zoster without complications: Secondary | ICD-10-CM | POA: Diagnosis not present

## 2022-02-21 DIAGNOSIS — Z139 Encounter for screening, unspecified: Secondary | ICD-10-CM | POA: Diagnosis not present

## 2022-02-21 DIAGNOSIS — B029 Zoster without complications: Secondary | ICD-10-CM | POA: Diagnosis not present

## 2022-03-09 DIAGNOSIS — M353 Polymyalgia rheumatica: Secondary | ICD-10-CM | POA: Diagnosis not present

## 2022-03-09 DIAGNOSIS — M1991 Primary osteoarthritis, unspecified site: Secondary | ICD-10-CM | POA: Diagnosis not present

## 2022-03-09 DIAGNOSIS — Z79899 Other long term (current) drug therapy: Secondary | ICD-10-CM | POA: Diagnosis not present

## 2022-03-09 DIAGNOSIS — M2560 Stiffness of unspecified joint, not elsewhere classified: Secondary | ICD-10-CM | POA: Diagnosis not present

## 2022-03-10 DIAGNOSIS — B029 Zoster without complications: Secondary | ICD-10-CM | POA: Diagnosis not present

## 2022-03-14 DIAGNOSIS — I129 Hypertensive chronic kidney disease with stage 1 through stage 4 chronic kidney disease, or unspecified chronic kidney disease: Secondary | ICD-10-CM | POA: Diagnosis not present

## 2022-03-14 DIAGNOSIS — E782 Mixed hyperlipidemia: Secondary | ICD-10-CM | POA: Diagnosis not present

## 2022-03-24 DIAGNOSIS — I129 Hypertensive chronic kidney disease with stage 1 through stage 4 chronic kidney disease, or unspecified chronic kidney disease: Secondary | ICD-10-CM | POA: Diagnosis not present

## 2022-03-24 DIAGNOSIS — B029 Zoster without complications: Secondary | ICD-10-CM | POA: Diagnosis not present

## 2022-03-24 DIAGNOSIS — N182 Chronic kidney disease, stage 2 (mild): Secondary | ICD-10-CM | POA: Diagnosis not present

## 2022-03-29 DIAGNOSIS — B029 Zoster without complications: Secondary | ICD-10-CM | POA: Diagnosis not present

## 2022-04-05 DIAGNOSIS — R52 Pain, unspecified: Secondary | ICD-10-CM | POA: Diagnosis not present

## 2022-04-05 DIAGNOSIS — Z20822 Contact with and (suspected) exposure to covid-19: Secondary | ICD-10-CM | POA: Diagnosis not present

## 2022-04-05 DIAGNOSIS — U071 COVID-19: Secondary | ICD-10-CM | POA: Diagnosis not present

## 2022-04-14 DIAGNOSIS — E782 Mixed hyperlipidemia: Secondary | ICD-10-CM | POA: Diagnosis not present

## 2022-04-14 DIAGNOSIS — I129 Hypertensive chronic kidney disease with stage 1 through stage 4 chronic kidney disease, or unspecified chronic kidney disease: Secondary | ICD-10-CM | POA: Diagnosis not present

## 2022-04-18 DIAGNOSIS — M62838 Other muscle spasm: Secondary | ICD-10-CM | POA: Diagnosis not present

## 2022-04-18 DIAGNOSIS — N182 Chronic kidney disease, stage 2 (mild): Secondary | ICD-10-CM | POA: Diagnosis not present

## 2022-04-18 DIAGNOSIS — I129 Hypertensive chronic kidney disease with stage 1 through stage 4 chronic kidney disease, or unspecified chronic kidney disease: Secondary | ICD-10-CM | POA: Diagnosis not present

## 2022-04-20 ENCOUNTER — Other Ambulatory Visit: Payer: Self-pay | Admitting: Cardiology

## 2022-04-21 ENCOUNTER — Other Ambulatory Visit: Payer: Self-pay | Admitting: Cardiology

## 2022-04-25 DIAGNOSIS — M2560 Stiffness of unspecified joint, not elsewhere classified: Secondary | ICD-10-CM | POA: Diagnosis not present

## 2022-04-25 DIAGNOSIS — M1991 Primary osteoarthritis, unspecified site: Secondary | ICD-10-CM | POA: Diagnosis not present

## 2022-04-25 DIAGNOSIS — M353 Polymyalgia rheumatica: Secondary | ICD-10-CM | POA: Diagnosis not present

## 2022-04-25 DIAGNOSIS — Z79899 Other long term (current) drug therapy: Secondary | ICD-10-CM | POA: Diagnosis not present

## 2022-05-13 DIAGNOSIS — I129 Hypertensive chronic kidney disease with stage 1 through stage 4 chronic kidney disease, or unspecified chronic kidney disease: Secondary | ICD-10-CM | POA: Diagnosis not present

## 2022-05-13 DIAGNOSIS — E782 Mixed hyperlipidemia: Secondary | ICD-10-CM | POA: Diagnosis not present

## 2022-05-13 DIAGNOSIS — N182 Chronic kidney disease, stage 2 (mild): Secondary | ICD-10-CM | POA: Diagnosis not present

## 2022-05-18 DIAGNOSIS — E782 Mixed hyperlipidemia: Secondary | ICD-10-CM | POA: Diagnosis not present

## 2022-05-24 DIAGNOSIS — N182 Chronic kidney disease, stage 2 (mild): Secondary | ICD-10-CM | POA: Diagnosis not present

## 2022-05-24 DIAGNOSIS — M353 Polymyalgia rheumatica: Secondary | ICD-10-CM | POA: Diagnosis not present

## 2022-05-24 DIAGNOSIS — I129 Hypertensive chronic kidney disease with stage 1 through stage 4 chronic kidney disease, or unspecified chronic kidney disease: Secondary | ICD-10-CM | POA: Diagnosis not present

## 2022-06-07 ENCOUNTER — Ambulatory Visit
Admission: RE | Admit: 2022-06-07 | Discharge: 2022-06-07 | Disposition: A | Discharge status: Home / Self Care | Payer: Medicare Other | Source: Ambulatory Visit | Attending: Hematology and Oncology | Admitting: Hematology and Oncology

## 2022-06-07 ENCOUNTER — Other Ambulatory Visit: Payer: Self-pay

## 2022-06-07 ENCOUNTER — Telehealth: Payer: Self-pay | Admitting: Cardiology

## 2022-06-07 DIAGNOSIS — D0512 Intraductal carcinoma in situ of left breast: Secondary | ICD-10-CM

## 2022-06-07 DIAGNOSIS — R928 Other abnormal and inconclusive findings on diagnostic imaging of breast: Secondary | ICD-10-CM | POA: Diagnosis not present

## 2022-06-07 MED ORDER — AZITHROMYCIN 500 MG PO TABS: ORAL_TABLET | ORAL | 0 refills | Status: DC

## 2022-06-07 NOTE — Telephone Encounter (Signed)
Patient is calling stating she is scheduled for a dentist appt today. She is requesting the prescription she needs to take prior to the appt be sent to the [facility] listed on her chart and would like a callback once it has been sent so she knows. She was unsure of the name of the medication, but states it was a red tablet when she took it last year. Please advise.

## 2022-06-07 NOTE — Telephone Encounter (Signed)
Called patient and informed her of the medication that Dr. Bettina Gavia had prescribed below:  Penicillin allergy  Azithromycin 500 mg 1 hr prior dental Give 5 tabs   Patient was appreciative for the call and had no further questions at this time.

## 2022-06-13 DIAGNOSIS — E782 Mixed hyperlipidemia: Secondary | ICD-10-CM | POA: Diagnosis not present

## 2022-06-13 DIAGNOSIS — I129 Hypertensive chronic kidney disease with stage 1 through stage 4 chronic kidney disease, or unspecified chronic kidney disease: Secondary | ICD-10-CM | POA: Diagnosis not present

## 2022-06-13 DIAGNOSIS — N182 Chronic kidney disease, stage 2 (mild): Secondary | ICD-10-CM | POA: Diagnosis not present

## 2022-06-14 ENCOUNTER — Other Ambulatory Visit: Payer: Self-pay | Admitting: Cardiology

## 2022-06-14 NOTE — Telephone Encounter (Signed)
Refill to pharmacy 

## 2022-06-22 DIAGNOSIS — D692 Other nonthrombocytopenic purpura: Secondary | ICD-10-CM | POA: Diagnosis not present

## 2022-06-22 DIAGNOSIS — B372 Candidiasis of skin and nail: Secondary | ICD-10-CM | POA: Diagnosis not present

## 2022-06-22 DIAGNOSIS — B0229 Other postherpetic nervous system involvement: Secondary | ICD-10-CM | POA: Diagnosis not present

## 2022-06-25 ENCOUNTER — Other Ambulatory Visit: Payer: Self-pay | Admitting: Cardiology

## 2022-06-27 ENCOUNTER — Other Ambulatory Visit: Payer: Self-pay | Admitting: Cardiology

## 2022-06-29 DIAGNOSIS — M546 Pain in thoracic spine: Secondary | ICD-10-CM | POA: Diagnosis not present

## 2022-06-29 DIAGNOSIS — M79662 Pain in left lower leg: Secondary | ICD-10-CM | POA: Diagnosis not present

## 2022-06-29 DIAGNOSIS — M62838 Other muscle spasm: Secondary | ICD-10-CM | POA: Diagnosis not present

## 2022-06-29 DIAGNOSIS — M79661 Pain in right lower leg: Secondary | ICD-10-CM | POA: Diagnosis not present

## 2022-06-29 DIAGNOSIS — R2689 Other abnormalities of gait and mobility: Secondary | ICD-10-CM | POA: Diagnosis not present

## 2022-07-06 DIAGNOSIS — R2689 Other abnormalities of gait and mobility: Secondary | ICD-10-CM | POA: Diagnosis not present

## 2022-07-06 DIAGNOSIS — M79662 Pain in left lower leg: Secondary | ICD-10-CM | POA: Diagnosis not present

## 2022-07-06 DIAGNOSIS — M546 Pain in thoracic spine: Secondary | ICD-10-CM | POA: Diagnosis not present

## 2022-07-06 DIAGNOSIS — M79661 Pain in right lower leg: Secondary | ICD-10-CM | POA: Diagnosis not present

## 2022-07-06 DIAGNOSIS — M62838 Other muscle spasm: Secondary | ICD-10-CM | POA: Diagnosis not present

## 2022-07-12 ENCOUNTER — Telehealth: Payer: Self-pay | Admitting: Cardiology

## 2022-07-12 MED ORDER — AMLODIPINE BESYLATE 5 MG PO TABS: 5.0000 mg | ORAL_TABLET | Freq: Every day | ORAL | 0 refills | Status: DC

## 2022-07-12 NOTE — Telephone Encounter (Signed)
Advised that RX has been sent to [facility].

## 2022-07-12 NOTE — Telephone Encounter (Signed)
Called patient to schedule echo and year follow up. Patient states she has been out of amlodipine for the last two weeks and the pharmacy claims they have tried to contact us on four separate occasions.   Please call [phone] to let patient know when it is sent in  Thank you

## 2022-07-13 DIAGNOSIS — M79661 Pain in right lower leg: Secondary | ICD-10-CM | POA: Diagnosis not present

## 2022-07-13 DIAGNOSIS — R2689 Other abnormalities of gait and mobility: Secondary | ICD-10-CM | POA: Diagnosis not present

## 2022-07-13 DIAGNOSIS — M546 Pain in thoracic spine: Secondary | ICD-10-CM | POA: Diagnosis not present

## 2022-07-13 DIAGNOSIS — M62838 Other muscle spasm: Secondary | ICD-10-CM | POA: Diagnosis not present

## 2022-07-13 DIAGNOSIS — M79662 Pain in left lower leg: Secondary | ICD-10-CM | POA: Diagnosis not present

## 2022-07-14 ENCOUNTER — Telehealth: Payer: Self-pay | Admitting: Cardiology

## 2022-07-14 NOTE — Telephone Encounter (Signed)
Patient is requesting call back to get order date changed for echo so she can move appt out past 05/26.

## 2022-07-15 ENCOUNTER — Other Ambulatory Visit: Payer: Self-pay

## 2022-07-15 DIAGNOSIS — Z952 Presence of prosthetic heart valve: Secondary | ICD-10-CM

## 2022-07-15 NOTE — Telephone Encounter (Signed)
Called patient and r/s echo and cancelled expired order  Thanks

## 2022-07-18 DIAGNOSIS — M79661 Pain in right lower leg: Secondary | ICD-10-CM | POA: Diagnosis not present

## 2022-07-18 DIAGNOSIS — M79662 Pain in left lower leg: Secondary | ICD-10-CM | POA: Diagnosis not present

## 2022-07-18 DIAGNOSIS — M546 Pain in thoracic spine: Secondary | ICD-10-CM | POA: Diagnosis not present

## 2022-07-18 DIAGNOSIS — M62838 Other muscle spasm: Secondary | ICD-10-CM | POA: Diagnosis not present

## 2022-07-18 DIAGNOSIS — R2689 Other abnormalities of gait and mobility: Secondary | ICD-10-CM | POA: Diagnosis not present

## 2022-07-20 DIAGNOSIS — M79661 Pain in right lower leg: Secondary | ICD-10-CM | POA: Diagnosis not present

## 2022-07-20 DIAGNOSIS — M546 Pain in thoracic spine: Secondary | ICD-10-CM | POA: Diagnosis not present

## 2022-07-20 DIAGNOSIS — M62838 Other muscle spasm: Secondary | ICD-10-CM | POA: Diagnosis not present

## 2022-07-20 DIAGNOSIS — M79662 Pain in left lower leg: Secondary | ICD-10-CM | POA: Diagnosis not present

## 2022-07-20 DIAGNOSIS — R2689 Other abnormalities of gait and mobility: Secondary | ICD-10-CM | POA: Diagnosis not present

## 2022-07-22 ENCOUNTER — Other Ambulatory Visit: Payer: Medicare Other

## 2022-07-28 DIAGNOSIS — M546 Pain in thoracic spine: Secondary | ICD-10-CM | POA: Diagnosis not present

## 2022-07-28 DIAGNOSIS — R2689 Other abnormalities of gait and mobility: Secondary | ICD-10-CM | POA: Diagnosis not present

## 2022-07-28 DIAGNOSIS — M62838 Other muscle spasm: Secondary | ICD-10-CM | POA: Diagnosis not present

## 2022-07-28 DIAGNOSIS — M79662 Pain in left lower leg: Secondary | ICD-10-CM | POA: Diagnosis not present

## 2022-07-28 DIAGNOSIS — M79661 Pain in right lower leg: Secondary | ICD-10-CM | POA: Diagnosis not present

## 2022-07-29 ENCOUNTER — Other Ambulatory Visit: Payer: Self-pay | Admitting: Cardiology

## 2022-07-29 NOTE — Telephone Encounter (Signed)
Rx sent to pharmacy   

## 2022-08-04 DIAGNOSIS — R2689 Other abnormalities of gait and mobility: Secondary | ICD-10-CM | POA: Diagnosis not present

## 2022-08-04 DIAGNOSIS — M79661 Pain in right lower leg: Secondary | ICD-10-CM | POA: Diagnosis not present

## 2022-08-04 DIAGNOSIS — M546 Pain in thoracic spine: Secondary | ICD-10-CM | POA: Diagnosis not present

## 2022-08-04 DIAGNOSIS — M79662 Pain in left lower leg: Secondary | ICD-10-CM | POA: Diagnosis not present

## 2022-08-04 DIAGNOSIS — M62838 Other muscle spasm: Secondary | ICD-10-CM | POA: Diagnosis not present

## 2022-08-05 ENCOUNTER — Other Ambulatory Visit: Payer: Self-pay

## 2022-08-05 ENCOUNTER — Inpatient Hospital Stay (HOSPITAL_COMMUNITY)
Admission: EM | Admit: 2022-08-05 | Discharge: 2022-08-08 | DRG: 394 | Disposition: A | Discharge status: Home / Self Care | Payer: Medicare Other | Attending: Internal Medicine | Admitting: Internal Medicine

## 2022-08-05 ENCOUNTER — Encounter (HOSPITAL_COMMUNITY): Payer: Self-pay | Admitting: Emergency Medicine

## 2022-08-05 DIAGNOSIS — Z880 Allergy status to penicillin: Secondary | ICD-10-CM | POA: Diagnosis not present

## 2022-08-05 DIAGNOSIS — K642 Third degree hemorrhoids: Secondary | ICD-10-CM

## 2022-08-05 DIAGNOSIS — I5032 Chronic diastolic (congestive) heart failure: Secondary | ICD-10-CM | POA: Diagnosis not present

## 2022-08-05 DIAGNOSIS — Z634 Disappearance and death of family member: Secondary | ICD-10-CM | POA: Diagnosis not present

## 2022-08-05 DIAGNOSIS — Z952 Presence of prosthetic heart valve: Secondary | ICD-10-CM | POA: Diagnosis not present

## 2022-08-05 DIAGNOSIS — D62 Acute posthemorrhagic anemia: Secondary | ICD-10-CM | POA: Diagnosis present

## 2022-08-05 DIAGNOSIS — N1831 Chronic kidney disease, stage 3a: Secondary | ICD-10-CM | POA: Diagnosis not present

## 2022-08-05 DIAGNOSIS — Z885 Allergy status to narcotic agent status: Secondary | ICD-10-CM | POA: Diagnosis not present

## 2022-08-05 DIAGNOSIS — E559 Vitamin D deficiency, unspecified: Secondary | ICD-10-CM | POA: Diagnosis present

## 2022-08-05 DIAGNOSIS — S40021A Contusion of right upper arm, initial encounter: Secondary | ICD-10-CM | POA: Diagnosis not present

## 2022-08-05 DIAGNOSIS — Z853 Personal history of malignant neoplasm of breast: Secondary | ICD-10-CM

## 2022-08-05 DIAGNOSIS — K5730 Diverticulosis of large intestine without perforation or abscess without bleeding: Secondary | ICD-10-CM | POA: Diagnosis present

## 2022-08-05 DIAGNOSIS — Z66 Do not resuscitate: Secondary | ICD-10-CM | POA: Diagnosis not present

## 2022-08-05 DIAGNOSIS — K625 Hemorrhage of anus and rectum: Secondary | ICD-10-CM

## 2022-08-05 DIAGNOSIS — I4719 Other supraventricular tachycardia: Secondary | ICD-10-CM | POA: Diagnosis not present

## 2022-08-05 DIAGNOSIS — I130 Hypertensive heart and chronic kidney disease with heart failure and stage 1 through stage 4 chronic kidney disease, or unspecified chronic kidney disease: Secondary | ICD-10-CM | POA: Diagnosis not present

## 2022-08-05 DIAGNOSIS — K922 Gastrointestinal hemorrhage, unspecified: Secondary | ICD-10-CM

## 2022-08-05 DIAGNOSIS — K219 Gastro-esophageal reflux disease without esophagitis: Secondary | ICD-10-CM | POA: Diagnosis present

## 2022-08-05 DIAGNOSIS — B0229 Other postherpetic nervous system involvement: Secondary | ICD-10-CM | POA: Diagnosis not present

## 2022-08-05 DIAGNOSIS — Z888 Allergy status to other drugs, medicaments and biological substances status: Secondary | ICD-10-CM

## 2022-08-05 DIAGNOSIS — Z602 Problems related to living alone: Secondary | ICD-10-CM | POA: Diagnosis present

## 2022-08-05 DIAGNOSIS — D696 Thrombocytopenia, unspecified: Secondary | ICD-10-CM | POA: Diagnosis present

## 2022-08-05 DIAGNOSIS — M48061 Spinal stenosis, lumbar region without neurogenic claudication: Secondary | ICD-10-CM | POA: Diagnosis present

## 2022-08-05 DIAGNOSIS — Z79899 Other long term (current) drug therapy: Secondary | ICD-10-CM | POA: Diagnosis not present

## 2022-08-05 DIAGNOSIS — Z953 Presence of xenogenic heart valve: Secondary | ICD-10-CM

## 2022-08-05 DIAGNOSIS — F40240 Claustrophobia: Secondary | ICD-10-CM | POA: Diagnosis present

## 2022-08-05 DIAGNOSIS — I495 Sick sinus syndrome: Secondary | ICD-10-CM | POA: Diagnosis present

## 2022-08-05 DIAGNOSIS — I2510 Atherosclerotic heart disease of native coronary artery without angina pectoris: Secondary | ICD-10-CM | POA: Diagnosis present

## 2022-08-05 DIAGNOSIS — M25561 Pain in right knee: Secondary | ICD-10-CM | POA: Diagnosis present

## 2022-08-05 DIAGNOSIS — Z87442 Personal history of urinary calculi: Secondary | ICD-10-CM

## 2022-08-05 DIAGNOSIS — K644 Residual hemorrhoidal skin tags: Secondary | ICD-10-CM | POA: Diagnosis not present

## 2022-08-05 DIAGNOSIS — G8929 Other chronic pain: Secondary | ICD-10-CM | POA: Diagnosis present

## 2022-08-05 DIAGNOSIS — Z820 Family history of epilepsy and other diseases of the nervous system: Secondary | ICD-10-CM

## 2022-08-05 DIAGNOSIS — Z90710 Acquired absence of both cervix and uterus: Secondary | ICD-10-CM

## 2022-08-05 DIAGNOSIS — M25562 Pain in left knee: Secondary | ICD-10-CM | POA: Diagnosis present

## 2022-08-05 DIAGNOSIS — Z86718 Personal history of other venous thrombosis and embolism: Secondary | ICD-10-CM

## 2022-08-05 DIAGNOSIS — Z7982 Long term (current) use of aspirin: Secondary | ICD-10-CM

## 2022-08-05 DIAGNOSIS — Z800 Family history of malignant neoplasm of digestive organs: Secondary | ICD-10-CM

## 2022-08-05 DIAGNOSIS — I350 Nonrheumatic aortic (valve) stenosis: Secondary | ICD-10-CM | POA: Diagnosis not present

## 2022-08-05 DIAGNOSIS — I10 Essential (primary) hypertension: Secondary | ICD-10-CM | POA: Diagnosis not present

## 2022-08-05 DIAGNOSIS — F419 Anxiety disorder, unspecified: Secondary | ICD-10-CM | POA: Diagnosis present

## 2022-08-05 HISTORY — DX: Third degree hemorrhoids: K64.2

## 2022-08-05 HISTORY — DX: Gastrointestinal hemorrhage, unspecified: K92.2

## 2022-08-05 HISTORY — DX: Hemorrhage of anus and rectum: K62.5

## 2022-08-05 LAB — URINALYSIS, ROUTINE W REFLEX MICROSCOPIC
Bilirubin Urine: NEGATIVE
Glucose, UA: NEGATIVE mg/dL
Ketones, ur: NEGATIVE mg/dL
Leukocytes,Ua: NEGATIVE
Nitrite: NEGATIVE
Protein, ur: NEGATIVE mg/dL
Specific Gravity, Urine: 1.012 (ref 1.005–1.030)
pH: 6 (ref 5.0–8.0)

## 2022-08-05 LAB — TYPE AND SCREEN
ABO/RH(D): O POS
Antibody Screen: NEGATIVE

## 2022-08-05 LAB — COMPREHENSIVE METABOLIC PANEL
ALT: 13 U/L (ref 0–44)
AST: 19 U/L (ref 15–41)
Albumin: 3.5 g/dL (ref 3.5–5.0)
Alkaline Phosphatase: 37 U/L — ABNORMAL LOW (ref 38–126)
Anion gap: 12 (ref 5–15)
BUN: 27 mg/dL — ABNORMAL HIGH (ref 8–23)
CO2: 21 mmol/L — ABNORMAL LOW (ref 22–32)
Calcium: 8.8 mg/dL — ABNORMAL LOW (ref 8.9–10.3)
Chloride: 108 mmol/L (ref 98–111)
Creatinine, Ser: 1.17 mg/dL — ABNORMAL HIGH (ref 0.44–1.00)
GFR, Estimated: 48 mL/min — ABNORMAL LOW (ref >60)
Glucose, Bld: 87 mg/dL (ref 70–99)
Potassium: 3.8 mmol/L (ref 3.5–5.1)
Sodium: 141 mmol/L (ref 135–145)
Total Bilirubin: 0.7 mg/dL (ref 0.3–1.2)
Total Protein: 6.5 g/dL (ref 6.5–8.1)

## 2022-08-05 LAB — OCCULT BLOOD, POC DEVICE: Fecal Occult Bld: POSITIVE — AB

## 2022-08-05 LAB — CBC WITH DIFFERENTIAL/PLATELET
Abs Immature Granulocytes: 0.06 10*3/uL (ref 0.00–0.07)
Basophils Absolute: 0 10*3/uL (ref 0.0–0.1)
Basophils Relative: 0 %
Eosinophils Absolute: 0.1 10*3/uL (ref 0.0–0.5)
Eosinophils Relative: 1 %
HCT: 38.8 % (ref 36.0–46.0)
Hemoglobin: 12 g/dL (ref 12.0–15.0)
Immature Granulocytes: 1 %
Lymphocytes Relative: 19 %
Lymphs Abs: 2 10*3/uL (ref 0.7–4.0)
MCH: 29.8 pg (ref 26.0–34.0)
MCHC: 30.9 g/dL (ref 30.0–36.0)
MCV: 96.3 fL (ref 80.0–100.0)
Monocytes Absolute: 0.9 10*3/uL (ref 0.1–1.0)
Monocytes Relative: 9 %
Neutro Abs: 7.4 10*3/uL (ref 1.7–7.7)
Neutrophils Relative %: 70 %
Platelets: 145 10*3/uL — ABNORMAL LOW (ref 150–400)
RBC: 4.03 MIL/uL (ref 3.87–5.11)
RDW: 12.4 % (ref 11.5–15.5)
WBC: 10.4 10*3/uL (ref 4.0–10.5)
nRBC: 0 % (ref 0.0–0.2)

## 2022-08-05 LAB — PROTIME-INR
INR: 1 (ref 0.8–1.2)
Prothrombin Time: 13.4 seconds (ref 11.4–15.2)

## 2022-08-05 LAB — HEMOGLOBIN: Hemoglobin: 10.8 g/dL — ABNORMAL LOW (ref 12.0–15.0)

## 2022-08-05 MED ORDER — RAMELTEON 8 MG PO TABS
8.0000 mg | ORAL_TABLET | Freq: Every day | ORAL | Status: DC
  Administered 2022-08-05 – 2022-08-07 (×3): 8 mg via ORAL
  Filled 2022-08-05 (×4): qty 1

## 2022-08-05 MED ORDER — LACTATED RINGERS IV BOLUS
500.0000 mL | Freq: Once | INTRAVENOUS | Status: AC
  Administered 2022-08-05: 500 mL via INTRAVENOUS

## 2022-08-05 MED ORDER — LIDOCAINE 5 % EX PTCH
1.0000 | MEDICATED_PATCH | CUTANEOUS | Status: DC
  Administered 2022-08-05 – 2022-08-07 (×3): 1 via TRANSDERMAL
  Filled 2022-08-05 (×3): qty 1

## 2022-08-05 MED ORDER — ONDANSETRON HCL 4 MG/2ML IJ SOLN: 4.0000 mg | Freq: Four times a day (QID) | INTRAMUSCULAR | Status: DC | PRN

## 2022-08-05 MED ORDER — ONDANSETRON HCL 4 MG/2ML IJ SOLN
4.0000 mg | Freq: Once | INTRAMUSCULAR | Status: AC
  Administered 2022-08-05: 4 mg via INTRAVENOUS
  Filled 2022-08-05: qty 2

## 2022-08-05 MED ORDER — PANTOPRAZOLE SODIUM 40 MG IV SOLR
40.0000 mg | Freq: Once | INTRAVENOUS | Status: AC
  Administered 2022-08-05: 40 mg via INTRAVENOUS
  Filled 2022-08-05: qty 10

## 2022-08-05 MED ORDER — ONDANSETRON HCL 4 MG PO TABS
4.0000 mg | ORAL_TABLET | Freq: Four times a day (QID) | ORAL | Status: DC | PRN
  Administered 2022-08-08: 4 mg via ORAL
  Filled 2022-08-05: qty 1

## 2022-08-05 MED ORDER — SENNOSIDES-DOCUSATE SODIUM 8.6-50 MG PO TABS: 1.0000 | ORAL_TABLET | Freq: Every evening | ORAL | Status: DC | PRN

## 2022-08-05 MED ORDER — HYDROCODONE-ACETAMINOPHEN 5-325 MG PO TABS
1.0000 | ORAL_TABLET | Freq: Once | ORAL | Status: AC
  Administered 2022-08-05: 1 via ORAL
  Filled 2022-08-05: qty 1

## 2022-08-05 MED ORDER — ACETAMINOPHEN 650 MG RE SUPP: 650.0000 mg | Freq: Four times a day (QID) | RECTAL | Status: DC | PRN

## 2022-08-05 MED ORDER — PANTOPRAZOLE SODIUM 40 MG PO TBEC
40.0000 mg | DELAYED_RELEASE_TABLET | Freq: Every day | ORAL | Status: DC
  Administered 2022-08-06 – 2022-08-08 (×3): 40 mg via ORAL
  Filled 2022-08-05 (×3): qty 1

## 2022-08-05 MED ORDER — PANTOPRAZOLE SODIUM 40 MG PO TBEC: 40.0000 mg | DELAYED_RELEASE_TABLET | Freq: Every day | ORAL | Status: DC

## 2022-08-05 MED ORDER — OXYCODONE HCL 5 MG PO TABS
5.0000 mg | ORAL_TABLET | Freq: Once | ORAL | Status: AC
  Administered 2022-08-05: 5 mg via ORAL
  Filled 2022-08-05: qty 1

## 2022-08-05 MED ORDER — MELATONIN 3 MG PO TABS: 3.0000 mg | ORAL_TABLET | Freq: Every day | ORAL | Status: DC

## 2022-08-05 MED ORDER — GABAPENTIN 100 MG PO CAPS
200.0000 mg | ORAL_CAPSULE | Freq: Three times a day (TID) | ORAL | Status: DC
  Administered 2022-08-05 – 2022-08-07 (×6): 200 mg via ORAL
  Filled 2022-08-05 (×6): qty 2

## 2022-08-05 MED ORDER — TAMOXIFEN CITRATE 10 MG PO TABS
20.0000 mg | ORAL_TABLET | Freq: Every day | ORAL | Status: DC
  Administered 2022-08-05 – 2022-08-08 (×4): 20 mg via ORAL
  Filled 2022-08-05 (×4): qty 2

## 2022-08-05 MED ORDER — ACETAMINOPHEN 325 MG PO TABS
650.0000 mg | ORAL_TABLET | Freq: Four times a day (QID) | ORAL | Status: DC | PRN
  Administered 2022-08-07 – 2022-08-08 (×2): 650 mg via ORAL
  Filled 2022-08-05 (×2): qty 2

## 2022-08-05 MED ORDER — HYDROCORTISONE ACETATE 25 MG RE SUPP
25.0000 mg | Freq: Two times a day (BID) | RECTAL | Status: DC
  Administered 2022-08-05 – 2022-08-06 (×3): 25 mg via RECTAL
  Filled 2022-08-05 (×8): qty 1
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
## 2022-08-05 NOTE — ED Triage Notes (Signed)
Pt to ER with c/o rectal bleeding starting at 0130 this AM.  Pt states hx of bleeding hemorrhoid.
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
## 2022-08-05 NOTE — ED Notes (Signed)
ED TO INPATIENT HANDOFF REPORT  ED Nurse Name and Phone #: Victorino Dike [phone]  S Name/Age/Gender Elizabeth [name] 77 y.o. female Room/Bed: 028C/028C  Code Status   Code Status: DNR  Home/SNF/Other Home Patient oriented to: self, place, time, and situation Is this baseline? Yes   Triage Complete: Triage complete  Chief Complaint Lower GI bleed [K92.2]  Triage Note Pt to ER with c/o rectal bleeding starting at 0130 this AM.  Pt states hx of bleeding hemorrhoid.   Allergies Allergies  Allergen Reactions   Penicillins Hives and Other (See Comments)    Has patient had a PCN reaction causing immediate rash, facial/tongue/throat swelling, SOB or lightheadedness with hypotension:    #  #  YES  #  #  Has patient had a PCN reaction causing severe rash involving mucus membranes or skin necrosis:    #  #  YES  #  #  Has patient had a PCN reaction that required hospitalization: No Has patient had a PCN reaction occurring within the last 10 years: No   Has patient had a PCN reaction causing immediate rash, facial/tongue/throat swelling, SOB or lightheadedness with hypotension: Yes Has patient had a PCN reaction causing severe rash involving mucus membranes or skin necrosis: Yes Has patient had a PCN reaction that required hospitalization: No Has patient had a PCN reaction occurring within the last 10 years: No If all of the above answers are "NO", then may proceed with Cephalosporin use. Has patient had a PCN reaction causing immediate rash, facial/tongue/throat swelling, SOB or lightheadedness with hypotension:    #  #  YES  #  #  Has patient had a PCN reaction causing severe rash involving mucus membranes or skin necrosis:    #  #  YES  #  #  Has patient had a PCN reaction that required hospitalization: No Has patient had a PCN reaction occurring within the last 10 years: No   Atorvastatin Itching    Redness and itching all over body    Codeine Nausea And Vomiting    Patient  and daughter in law at bedside stated that patient doesn't have any problems with taking this.    Level of Care/Admitting Diagnosis ED Disposition     ED Disposition  Admit   Condition  --   Comment  Hospital Area: [facility] [[id number]]  Level of Care: Telemetry Medical [104]  May place patient in observation at [facility] or [facility] if equivalent level of care is available:: No  Covid Evaluation: Asymptomatic - no recent exposure (last 10 days) testing not required  Diagnosis: Lower GI bleed [454098]  Admitting Physician: Reymundo Poll [[id number]]  Attending Physician: Reymundo Poll [[id number]]          B Medical/Surgery History Past Medical History:  Diagnosis Date   Anxiety    extreme   Benign hypertension with CKD (chronic kidney disease), stage II    Breast cancer (HCC)    Pre-cancerous cell in Left breast   Chronic diastolic heart failure (HCC) 12/20/2018   Chronic pain of both knees 09/12/2017   Elizabeth [name] is a 77 y.o. female with low back pain, bilateral knee pain, due to multilevel multifactorial degenerative changes in the lumbar spine, and, significant multiple joint osteoarthritis.   Last Assessment & Plan:  Today I will treat with bilateral knee injections.   Elizabeth [name] will return to the clinic in 6 months.  I will asses the efficacy of this treatment  and make adjustments to her    CKD (chronic kidney disease), stage III (HCC)    Claustrophobia    Ductal carcinoma in situ (DCIS) of left breast 11/08/2017   GERD (gastroesophageal reflux disease)    History of kidney stones    Hypertension 09/25/2018   Hyperuricemia    Insomnia 09/25/2018   Lumbar radiculopathy 11/28/2017   Elizabeth [name] is a 77 y.o. female with low back pain, lower extremity pain, on the left, in the setting of previous laminotomy in April 2019.   Last Assessment & Plan:  Today I will treat with current OTC medications..   Elizabeth [name] will return to the clinic on an as needed  basis.  I will asses the efficacy of this treatment and make adjustments to her treatment plan as necessary.  Last Assessment &   Lumbar stenosis    Osteoarthrosis    S/P TAVR (transcatheter aortic valve replacement) 05/28/2019   s/p TAVR with a 29 mm Medtronic Evolut Pro + via the TF approach by Dr. Excell Seltzer and Dr. Laneta Simmers    Severe aortic stenosis    Tachycardia 09/25/2018   Vitamin D deficiency    Past Surgical History:  Procedure Laterality Date   ABDOMINAL HYSTERECTOMY     ABDOMINAL HYSTERECTOMY     APPENDECTOMY     BACK SURGERY     BREAST BIOPSY Left 10/2017   CARDIAC CATHETERIZATION  03/11/2019   CHOLECYSTECTOMY     GALLBLADDER SURGERY     INTRAOPERATIVE TRANSTHORACIC ECHOCARDIOGRAM  05/28/2019   Procedure: Intraoperative Transthoracic Echocardiogram;  Surgeon: Tonny Bollman, MD;  Location: [facility] OR;  Service: Open Heart Surgery;;   IR RADIOLOGY PERIPHERAL GUIDED IV START  05/14/2019   IR US GUIDE VASC ACCESS RIGHT  05/14/2019   LUMBAR LAMINECTOMY/DECOMPRESSION MICRODISCECTOMY N/A 06/12/2017   Procedure: LAMINECTOMY AND FORAMINOTOMY LUMBAR THREE- LUMBAR FOUR, LUMBAR FOUR- LUMBAR FIVE;  Surgeon: Tressie Stalker, MD;  Location: [facility] OR;  Service: Neurosurgery;  Laterality: N/A;   RIGHT/LEFT HEART CATH AND CORONARY ANGIOGRAPHY N/A 03/11/2019   Procedure: RIGHT/LEFT HEART CATH AND CORONARY ANGIOGRAPHY;  Surgeon: Tonny Bollman, MD;  Location: [facility] INVASIVE CV LAB;  Service: Cardiovascular;  Laterality: N/A;   TONSILLECTOMY     TRANSCATHETER AORTIC VALVE REPLACEMENT, TRANSFEMORAL N/A 05/28/2019   Procedure: TRANSCATHETER AORTIC VALVE REPLACEMENT, TRANSFEMORAL;  Surgeon: Tonny Bollman, MD;  Location: [facility] OR;  Service: Open Heart Surgery;  Laterality: N/A;     A IV Location/Drains/Wounds Patient Lines/Drains/Airways Status     Active Line/Drains/Airways     Name Placement date Placement time Site Days   Peripheral IV 08/05/22 20 G Anterior;Right Forearm 08/05/22  0808  Forearm  less  than 1            Intake/Output Last 24 hours No intake or output data in the 24 hours ending 08/05/22 1224  Labs/Imaging Results for orders placed or performed during the hospital encounter of 08/05/22 (from the past 48 hour(s))  CBC with Differential     Status: Abnormal   Collection Time: 08/05/22  8:11 AM  Result Value Ref Range   WBC 10.4 4.0 - 10.5 K/uL   RBC 4.03 3.87 - 5.11 MIL/uL   Hemoglobin 12.0 12.0 - 15.0 g/dL   HCT 19.1 47.8 - 29.5 %   MCV 96.3 80.0 - 100.0 fL   MCH 29.8 26.0 - 34.0 pg   MCHC 30.9 30.0 - 36.0 g/dL   RDW 62.1 30.8 - 65.7 %   Platelets 145 (L) 150 - 400  K/uL   nRBC 0.0 0.0 - 0.2 %   Neutrophils Relative % 70 %   Neutro Abs 7.4 1.7 - 7.7 K/uL   Lymphocytes Relative 19 %   Lymphs Abs 2.0 0.7 - 4.0 K/uL   Monocytes Relative 9 %   Monocytes Absolute 0.9 0.1 - 1.0 K/uL   Eosinophils Relative 1 %   Eosinophils Absolute 0.1 0.0 - 0.5 K/uL   Basophils Relative 0 %   Basophils Absolute 0.0 0.0 - 0.1 K/uL   Immature Granulocytes 1 %   Abs Immature Granulocytes 0.06 0.00 - 0.07 K/uL    Comment: Performed at [facility], [address], [place], [place] [address]  Comprehensive metabolic panel     Status: Abnormal   Collection Time: 08/05/22  8:11 AM  Result Value Ref Range   Sodium 141 135 - 145 mmol/L   Potassium 3.8 3.5 - 5.1 mmol/L   Chloride 108 98 - 111 mmol/L   CO2 21 (L) 22 - 32 mmol/L   Glucose, Bld 87 70 - 99 mg/dL    Comment: Glucose reference range applies only to samples taken after fasting for at least 8 hours.   BUN 27 (H) 8 - 23 mg/dL   Creatinine, Ser 6.04 (H) 0.44 - 1.00 mg/dL   Calcium 8.8 (L) 8.9 - 10.3 mg/dL   Total Protein 6.5 6.5 - 8.1 g/dL   Albumin 3.5 3.5 - 5.0 g/dL   AST 19 15 - 41 U/L   ALT 13 0 - 44 U/L   Alkaline Phosphatase 37 (L) 38 - 126 U/L   Total Bilirubin 0.7 0.3 - 1.2 mg/dL   GFR, Estimated 48 (L) >60 mL/min    Comment: (NOTE) Calculated using the CKD-EPI Creatinine Equation (2021)    Anion gap  12 5 - 15    Comment: Performed at [facility], [address] [place], [place] [address]  Protime-INR     Status: None   Collection Time: 08/05/22  8:11 AM  Result Value Ref Range   Prothrombin Time 13.4 11.4 - 15.2 seconds   INR 1.0 0.8 - 1.2    Comment: (NOTE) INR goal varies based on device and disease states. Performed at [facility], [address] [place], [place] [address]   Type and screen [facility]     Status: None   Collection Time: 08/05/22  8:11 AM  Result Value Ref Range   ABO/RH(D) O POS    Antibody Screen NEG    Sample Expiration      08/08/2022,2359 Performed at [facility], [address] [place], [place] [address]   Urinalysis, Routine w reflex microscopic -Urine, Clean Catch     Status: Abnormal   Collection Time: 08/05/22 10:07 AM  Result Value Ref Range   Color, Urine YELLOW YELLOW   APPearance CLEAR CLEAR   Specific Gravity, Urine 1.012 1.005 - 1.030   pH 6.0 5.0 - 8.0   Glucose, UA NEGATIVE NEGATIVE mg/dL   Hgb urine dipstick SMALL (A) NEGATIVE   Bilirubin Urine NEGATIVE NEGATIVE   Ketones, ur NEGATIVE NEGATIVE mg/dL   Protein, ur NEGATIVE NEGATIVE mg/dL   Nitrite NEGATIVE NEGATIVE   Leukocytes,Ua NEGATIVE NEGATIVE   RBC / HPF 0-5 0 - 5 RBC/hpf   WBC, UA 0-5 0 - 5 WBC/hpf   Bacteria, UA RARE (A) NONE SEEN   Squamous Epithelial / HPF 0-5 0 - 5 /HPF   Mucus PRESENT  Comment: Performed at [facility], [address], [place], [place] [address]   No results found.  Pending Labs Unresulted Labs (From admission, onward)     Start     Ordered   08/05/22 1800  Hemoglobin  Now then every 12 hours,   R (with URGENT occurrences)      08/05/22 1055            Vitals/Pain Today's Vitals   08/05/22 0841 08/05/22 1010 08/05/22 1200 08/05/22 1221  BP: 125/68  112/61   Pulse: (!) 111  (!) 111   Resp: 15  (!) 25   Temp: 98.2 F (36.8 C)   98.3 F (36.8 C)  TempSrc: Oral   Oral  SpO2: 98%  99%    Weight:      Height:      PainSc:  4       Isolation Precautions No active isolations  Medications Medications  hydrocortisone (ANUSOL-HC) suppository 25 mg (has no administration in time range)  acetaminophen (TYLENOL) tablet 650 mg (has no administration in time range)    Or  acetaminophen (TYLENOL) suppository 650 mg (has no administration in time range)  senna-docusate (Senokot-S) tablet 1 tablet (has no administration in time range)  ondansetron (ZOFRAN) tablet 4 mg (has no administration in time range)    Or  ondansetron (ZOFRAN) injection 4 mg (has no administration in time range)  lactated ringers bolus 500 mL (0 mLs Intravenous Stopped 08/05/22 0959)  pantoprazole (PROTONIX) injection 40 mg (40 mg Intravenous Given 08/05/22 0810)  ondansetron (ZOFRAN) injection 4 mg (4 mg Intravenous Given 08/05/22 0810)  HYDROcodone-acetaminophen (NORCO/VICODIN) 5-325 MG per tablet 1 tablet (1 tablet Oral Given 08/05/22 0915)    Mobility walks     Focused Assessments Cardiac Assessment Handoff:    No results found for: "CKTOTAL", "CKMB", "CKMBINDEX", "TROPONINI" No results found for: "DDIMER" Does the Patient currently have chest pain? No    R Recommendations: See Admitting Provider Note  Report given to:   Additional Notes:

## 2022-08-06 DIAGNOSIS — N1831 Chronic kidney disease, stage 3a: Secondary | ICD-10-CM | POA: Diagnosis present

## 2022-08-06 DIAGNOSIS — K922 Gastrointestinal hemorrhage, unspecified: Secondary | ICD-10-CM | POA: Diagnosis present

## 2022-08-06 DIAGNOSIS — I495 Sick sinus syndrome: Secondary | ICD-10-CM | POA: Diagnosis present

## 2022-08-06 DIAGNOSIS — Z880 Allergy status to penicillin: Secondary | ICD-10-CM | POA: Diagnosis not present

## 2022-08-06 DIAGNOSIS — S40021A Contusion of right upper arm, initial encounter: Secondary | ICD-10-CM | POA: Diagnosis present

## 2022-08-06 DIAGNOSIS — B0229 Other postherpetic nervous system involvement: Secondary | ICD-10-CM | POA: Diagnosis present

## 2022-08-06 DIAGNOSIS — Z79899 Other long term (current) drug therapy: Secondary | ICD-10-CM | POA: Diagnosis not present

## 2022-08-06 DIAGNOSIS — I4719 Other supraventricular tachycardia: Secondary | ICD-10-CM

## 2022-08-06 DIAGNOSIS — F40240 Claustrophobia: Secondary | ICD-10-CM | POA: Diagnosis present

## 2022-08-06 DIAGNOSIS — D696 Thrombocytopenia, unspecified: Secondary | ICD-10-CM | POA: Diagnosis present

## 2022-08-06 DIAGNOSIS — K642 Third degree hemorrhoids: Secondary | ICD-10-CM | POA: Diagnosis present

## 2022-08-06 DIAGNOSIS — K5730 Diverticulosis of large intestine without perforation or abscess without bleeding: Secondary | ICD-10-CM | POA: Diagnosis present

## 2022-08-06 DIAGNOSIS — I2510 Atherosclerotic heart disease of native coronary artery without angina pectoris: Secondary | ICD-10-CM | POA: Diagnosis present

## 2022-08-06 DIAGNOSIS — K644 Residual hemorrhoidal skin tags: Secondary | ICD-10-CM | POA: Diagnosis present

## 2022-08-06 DIAGNOSIS — Z888 Allergy status to other drugs, medicaments and biological substances status: Secondary | ICD-10-CM | POA: Diagnosis not present

## 2022-08-06 DIAGNOSIS — Z952 Presence of prosthetic heart valve: Secondary | ICD-10-CM | POA: Diagnosis not present

## 2022-08-06 DIAGNOSIS — Z634 Disappearance and death of family member: Secondary | ICD-10-CM | POA: Diagnosis not present

## 2022-08-06 DIAGNOSIS — I5032 Chronic diastolic (congestive) heart failure: Secondary | ICD-10-CM | POA: Diagnosis present

## 2022-08-06 DIAGNOSIS — K625 Hemorrhage of anus and rectum: Secondary | ICD-10-CM | POA: Diagnosis present

## 2022-08-06 DIAGNOSIS — I350 Nonrheumatic aortic (valve) stenosis: Secondary | ICD-10-CM | POA: Diagnosis present

## 2022-08-06 DIAGNOSIS — I130 Hypertensive heart and chronic kidney disease with heart failure and stage 1 through stage 4 chronic kidney disease, or unspecified chronic kidney disease: Secondary | ICD-10-CM | POA: Diagnosis present

## 2022-08-06 DIAGNOSIS — Z885 Allergy status to narcotic agent status: Secondary | ICD-10-CM | POA: Diagnosis not present

## 2022-08-06 DIAGNOSIS — Z66 Do not resuscitate: Secondary | ICD-10-CM | POA: Diagnosis present

## 2022-08-06 DIAGNOSIS — Z953 Presence of xenogenic heart valve: Secondary | ICD-10-CM | POA: Diagnosis not present

## 2022-08-06 DIAGNOSIS — Z853 Personal history of malignant neoplasm of breast: Secondary | ICD-10-CM | POA: Diagnosis not present

## 2022-08-06 DIAGNOSIS — D62 Acute posthemorrhagic anemia: Secondary | ICD-10-CM | POA: Diagnosis present

## 2022-08-06 LAB — RENAL FUNCTION PANEL
Albumin: 2.9 g/dL — ABNORMAL LOW (ref 3.5–5.0)
Anion gap: 6 (ref 5–15)
BUN: 18 mg/dL (ref 8–23)
CO2: 26 mmol/L (ref 22–32)
Calcium: 8.3 mg/dL — ABNORMAL LOW (ref 8.9–10.3)
Chloride: 108 mmol/L (ref 98–111)
Creatinine, Ser: 1.07 mg/dL — ABNORMAL HIGH (ref 0.44–1.00)
GFR, Estimated: 54 mL/min — ABNORMAL LOW (ref >60)
Glucose, Bld: 110 mg/dL — ABNORMAL HIGH (ref 70–99)
Phosphorus: 3.7 mg/dL (ref 2.5–4.6)
Potassium: 4.3 mmol/L (ref 3.5–5.1)
Sodium: 140 mmol/L (ref 135–145)

## 2022-08-06 LAB — CBC
HCT: 33.2 % — ABNORMAL LOW (ref 36.0–46.0)
Hemoglobin: 10.7 g/dL — ABNORMAL LOW (ref 12.0–15.0)
MCH: 30.7 pg (ref 26.0–34.0)
MCHC: 32.2 g/dL (ref 30.0–36.0)
MCV: 95.4 fL (ref 80.0–100.0)
Platelets: 129 10*3/uL — ABNORMAL LOW (ref 150–400)
RBC: 3.48 MIL/uL — ABNORMAL LOW (ref 3.87–5.11)
RDW: 12.6 % (ref 11.5–15.5)
WBC: 5.4 10*3/uL (ref 4.0–10.5)
nRBC: 0 % (ref 0.0–0.2)

## 2022-08-06 LAB — HEMOGLOBIN: Hemoglobin: 11 g/dL — ABNORMAL LOW (ref 12.0–15.0)

## 2022-08-06 LAB — MAGNESIUM: Magnesium: 2.2 mg/dL (ref 1.7–2.4)

## 2022-08-06 MED ORDER — ENOXAPARIN SODIUM 40 MG/0.4ML IJ SOSY
40.0000 mg | PREFILLED_SYRINGE | Freq: Every day | INTRAMUSCULAR | Status: DC
  Administered 2022-08-06 – 2022-08-07 (×2): 40 mg via SUBCUTANEOUS
  Filled 2022-08-06 (×2): qty 0.4

## 2022-08-06 MED ORDER — ACETAMINOPHEN 500 MG PO TABS
1000.0000 mg | ORAL_TABLET | Freq: Once | ORAL | Status: AC
  Administered 2022-08-06: 1000 mg via ORAL
  Filled 2022-08-06: qty 2

## 2022-08-06 MED ORDER — METOPROLOL SUCCINATE ER 25 MG PO TB24
12.5000 mg | ORAL_TABLET | Freq: Every day | ORAL | Status: DC
  Administered 2022-08-06 – 2022-08-08 (×3): 12.5 mg via ORAL
  Filled 2022-08-06 (×3): qty 1

## 2022-08-06 MED ORDER — GABAPENTIN 100 MG PO CAPS
200.0000 mg | ORAL_CAPSULE | Freq: Once | ORAL | Status: AC
  Administered 2022-08-06: 200 mg via ORAL
  Filled 2022-08-06: qty 2

## 2022-08-06 MED ORDER — FLECAINIDE ACETATE 50 MG PO TABS
50.0000 mg | ORAL_TABLET | Freq: Two times a day (BID) | ORAL | Status: DC
  Administered 2022-08-06 – 2022-08-08 (×5): 50 mg via ORAL
  Filled 2022-08-06 (×5): qty 1

## 2022-08-06 MED ORDER — CAPSAICIN 0.025 % EX CREA
TOPICAL_CREAM | Freq: Once | CUTANEOUS | Status: AC
  Filled 2022-08-06: qty 60

## 2022-08-06 NOTE — Progress Notes (Addendum)
Subjective:  No significant overnight events. Patient had a bowel movement this morning that was non-bloody. She was able to ambulate to the bathroom without difficulty. She reports the hydrocortisone suppositories are working well for her hemorrhoids. She denies chest pain, palpitations, shortness of breath, dizziness, and abdominal pain. She has been receiving notifications from her Apple Watch regarding an elevated heart rate, which makes her concerned about returning home.  Objective:  Vital signs in last 24 hours: Vitals:   08/06/22 0029 08/06/22 0434 08/06/22 0732 08/06/22 1158  BP: (!) 108/54 95/60 111/61 110/63  Pulse: (!) 104 99 96 (!) 101  Resp: 18 18 17 17   Temp: 97.6 F (36.4 C) 97.7 F (36.5 C) 97.6 F (36.4 C) 98.3 F (36.8 C)  TempSrc: Oral Oral Oral Oral  SpO2: 94% 93% 94% 94%  Weight:      Height:       Physical Exam: General: Pleasant, well-appearing elderly woman laying in bed. Appears comfortable and in no acute distress. HEENT: Normocephalic, atraumatic. CV: Normal rhythm, tachycardic rate with occasional skipped beats. Pulmonary: Normal effort on room air. Lungs clear to auscultation bilaterally. No wheezing or rales. Abdominal: Soft, non-distended, non-tender. Normal bowel sounds. Extremities: Radial and DP pulses 2+ and symmetric. Skin: Warm and dry. No obvious rash or lesions. Neuro: A&Ox3. Moves all extremities. Psych: Normal mood and affect  Assessment/Plan:  Principal Problem:   Lower GI bleed Active Problems:   Grade III hemorrhoids   Rectal bleeding  Ms. Acheampong is a 77 year-old female with a past medical history of breast cancer, anxiety, CKDIIIA, GERD, hypertension, diverticulosis, severe aortic stenosis s/p TAVR in 2021 who presented for evaluation of bright red blood per rectum and admitted on 5/24 for lower GI bleed.   # Lower GI bleed # Rectal bleeding # Hx of internal and external hemorrhoids Patient denies any BRBPR since admission.  She had a normal bowel movement this morning. Hemoglobin is stable at 10.7. Suspect bleeding was secondary to internal hemorrhoids. Patient will need to follow-up with GI outpatient for hemorrhoidal banding. Will continue hydrocortisone suppositories since working well for patient. - Hemoglobin every 12 hours, transfuse if hgb <7  - Hydrocortisone suppository BID - Advance to regular diet, encourage fiber supplementation to soften stools - Senna PRN to help  - Protonix 40 mg  # Ectopic Atrial Tachycardia with intermittent sinus bradycardia # Severe AS s/p TAVR # Chronic diastolic heart failure Patient tachycardic with occasional skipped beats on exam. Telemetry showing tachycardia in 100-110s with intermittent episodes of bradycardia in 40s. Patient denies any chest pain, shortness of breath, palpitations, dyspnea on exertion, or dizziness. TTE 1 year ago showed EF 55-60%, moderate LVH, G2DD, and severely dilated LA. Patient scheduled for outpatient cardiology appointment on 08/09/2022 to schedule repeat Echo. She is concerned about returning home given elevated heart rate notifications per Apple Watch. Consulted cardiology to evaluate tachycardia and intermittent bradycardia and for medication recommendations at discharge. Cardiology started flecainide 50 mg BID and metoprolol 12.5 mg for ectopic atrial tachycardia. - Appreciate cardiology recommendations - Start Flecainide 50 mg BID - Start Metoprolol succinate 12.5 mg - Hold Coreg 6.25 mg BID - Telemetry  # Hypertension Blood pressures low-normal with MAPs in 70s. Will continue holding home antihypertensives. - Start Metoprolol succinate 12.5 mg - Hold Coreg 6.25 mg BID - Hold lisinopril 10 mg - Hold amlodipine 5 mg  # CKD 3A  Creatinine at baseline at 1.07 with GFR 54.  # Postherpetic neuralgia  Patient  experiencing pain in dermatomal distribution on chest. She received oxycodone 5 mg x 1 overnight because of severe pain. Will continue  gabapentin and lidocaine patches. - Gabapentin 200 mg TID - Lidocaine patch  #Hx breast cancer  Will continue home tamoxifen. - Tamoxifen 20 mg  # GERD  Will continue home Protonix. - Protonix 40 mg  Diet: Regular Bowel: Senna VTE: Lovenox IVF: None Code: DNR LOS: day 1  Prior to Admission Living Arrangement: Home alone Anticipated Discharge Location: home Barriers to Discharge: continued management  Whitman Hero, Medical Student 08/06/2022, 12:19 PM On call pager: [phone]

## 2022-08-06 NOTE — Consult Note (Addendum)
Cardiology Consultation   Patient ID: Marilyn Hrabovsky MRN: [id number]; DOB: Mar 27, 1945  Admit date: 08/05/2022 Date of Consult: 08/06/2022  PCP:  Abner Greenspan, MD   [facility] Providers Cardiologist:  None        Patient Profile:   Stephy Mccoppin is a 77 y.o. female with a hx of aortic stenosis s/p TAVR in 05/2019, (HFpEF) heart failure with preserved ejection fraction, coronary artery disease (LAD 40% by cath in 2020), hypertension, chronic kidney disease, PVCs, Breast CA, hx of DVT, GERD who is being seen 08/06/2022 for the evaluation of tachy brady syndrome at the request of Dr. Mayford Knife.  History of Present Illness:   Ms. Bowes was last seen by Dr. Dulce Sellar in 06/2021. She was admitted 08/05/2022 with bright blood per rectum. She has been noted to be tachycardic with intermittent episodes of bradycardia. Of note, she has an OP f/u echocardiogram scheduled 5/28 in [place].   She is resting comfortably in bed. She has not had chest pain, shortness of breath, syncope, near syncope, orthopnea. She has noted increased leg edema prior to admission. Her edema is better now. She has not had further bleeding.   Past Medical History:  Diagnosis Date   Anxiety    extreme   Benign hypertension with CKD (chronic kidney disease), stage II    Breast cancer (HCC)    Pre-cancerous cell in Left breast   Chronic diastolic heart failure (HCC) 12/20/2018   Chronic pain of both knees 09/12/2017   Ms. Dorcie Lanser is a 77 y.o. female with low back pain, bilateral knee pain, due to multilevel multifactorial degenerative changes in the lumbar spine, and, significant multiple joint osteoarthritis.   Last Assessment & Plan:  Today I will treat with bilateral knee injections.   Ms. Stuver will return to the clinic in 6 months.  I will asses the efficacy of this treatment and make adjustments to her    CKD (chronic kidney disease), stage III (HCC)    Claustrophobia    Ductal carcinoma in situ  (DCIS) of left breast 11/08/2017   GERD (gastroesophageal reflux disease)    History of kidney stones    Hypertension 09/25/2018   Hyperuricemia    Insomnia 09/25/2018   Lumbar radiculopathy 11/28/2017   Ms. Laziya Morello is a 77 y.o. female with low back pain, lower extremity pain, on the left, in the setting of previous laminotomy in April 2019.   Last Assessment & Plan:  Today I will treat with current OTC medications..   Ms. Minelli will return to the clinic on an as needed basis.  I will asses the efficacy of this treatment and make adjustments to her treatment plan as necessary.  Last Assessment &   Lumbar stenosis    Osteoarthrosis    S/P TAVR (transcatheter aortic valve replacement) 05/28/2019   s/p TAVR with a 29 mm Medtronic Evolut Pro + via the TF approach by Dr. Excell Seltzer and Dr. Laneta Simmers    Severe aortic stenosis    Tachycardia 09/25/2018   Vitamin D deficiency     Past Surgical History:  Procedure Laterality Date   ABDOMINAL HYSTERECTOMY     ABDOMINAL HYSTERECTOMY     APPENDECTOMY     BACK SURGERY     BREAST BIOPSY Left 10/2017   CARDIAC CATHETERIZATION  03/11/2019   CHOLECYSTECTOMY     GALLBLADDER SURGERY     INTRAOPERATIVE TRANSTHORACIC ECHOCARDIOGRAM  05/28/2019   Procedure: Intraoperative Transthoracic Echocardiogram;  Surgeon: Tonny Bollman, MD;  Location: [facility] OR;  Service: Open Heart Surgery;;   IR RADIOLOGY PERIPHERAL GUIDED IV START  05/14/2019   IR US GUIDE VASC ACCESS RIGHT  05/14/2019   LUMBAR LAMINECTOMY/DECOMPRESSION MICRODISCECTOMY N/A 06/12/2017   Procedure: LAMINECTOMY AND FORAMINOTOMY LUMBAR THREE- LUMBAR FOUR, LUMBAR FOUR- LUMBAR FIVE;  Surgeon: Tressie Stalker, MD;  Location: [facility] OR;  Service: Neurosurgery;  Laterality: N/A;   RIGHT/LEFT HEART CATH AND CORONARY ANGIOGRAPHY N/A 03/11/2019   Procedure: RIGHT/LEFT HEART CATH AND CORONARY ANGIOGRAPHY;  Surgeon: Tonny Bollman, MD;  Location: [facility] INVASIVE CV LAB;  Service: Cardiovascular;  Laterality: N/A;    TONSILLECTOMY     TRANSCATHETER AORTIC VALVE REPLACEMENT, TRANSFEMORAL N/A 05/28/2019   Procedure: TRANSCATHETER AORTIC VALVE REPLACEMENT, TRANSFEMORAL;  Surgeon: Tonny Bollman, MD;  Location: [facility] OR;  Service: Open Heart Surgery;  Laterality: N/A;     Home Medications:  Prior to Admission medications   Medication Sig Start Date End Date Taking? Authorizing Provider  amLODipine (NORVASC) 5 MG tablet Take 1 tablet (5 mg total) by mouth daily. 07/12/22  Yes Baldo Daub, MD  carvedilol (COREG) 6.25 MG tablet TAKE 1 TABLET(6.25 MG) BY MOUTH TWICE DAILY WITH A MEAL Patient taking differently: Take 6.25 mg by mouth 2 (two) times daily with a meal. 07/29/22  Yes Baldo Daub, MD  furosemide (LASIX) 20 MG tablet Take 1 tablet (20 mg total) by mouth every other day. TAKE 1 TABLET(20 MG) BY MOUTH DAILY 07/29/22  Yes Baldo Daub, MD  gabapentin (NEURONTIN) 100 MG capsule Take 200 mg by mouth 3 (three) times daily. 06/22/22  Yes [provider]  lisinopril (ZESTRIL) 10 MG tablet Take 10 mg by mouth daily.   Yes [provider]  pantoprazole (PROTONIX) 40 MG tablet TAKE 1 TABLET(40 MG) BY MOUTH DAILY Patient taking differently: Take 40 mg by mouth daily. 06/14/22  Yes Baldo Daub, MD  tamoxifen (NOLVADEX) 20 MG tablet Take 1 tablet (20 mg total) by mouth daily. 10/27/21  Yes Serena Croissant, MD  acetaminophen (TYLENOL) 325 MG tablet Take 3 tablets (975 mg total) by mouth every 6 (six) hours as needed for mild pain. Patient not taking: Reported on 08/05/2022 01/11/22   Adam Phenix, PA-C  azithromycin (ZITHROMAX) 500 MG tablet Take 1 hour prior to dental procedure. Patient not taking: Reported on 08/05/2022 06/07/22   Baldo Daub, MD  lidocaine (LIDODERM) 5 % Place 1 patch onto the skin daily. Remove & Discard patch within 12 hours or as directed by MD Patient not taking: Reported on 08/05/2022 01/12/22   Adam Phenix, PA-C  methocarbamol (ROBAXIN) 500 MG tablet Take 1  tablet (500 mg total) by mouth every 8 (eight) hours as needed for muscle spasms. Patient not taking: Reported on 08/05/2022 01/11/22   Adam Phenix, PA-C  oxyCODONE (OXY IR/ROXICODONE) 5 MG immediate release tablet Take 1 tablet (5 mg total) by mouth every 6 (six) hours as needed for moderate pain or severe pain (pain not relieved by tylenol, robaxin, lidoderm). Patient not taking: Reported on 08/05/2022 01/11/22   Adam Phenix, PA-C  predniSONE (DELTASONE) 1 MG tablet Take 4 mg by mouth 2 (two) times daily.    [provider]    Inpatient Medications: Scheduled Meds:  gabapentin  200 mg Oral TID   hydrocortisone  25 mg Rectal BID   lidocaine  1 patch Transdermal Q24H   pantoprazole  40 mg Oral Daily   ramelteon  8 mg Oral QHS   tamoxifen  20  mg Oral Daily   Continuous Infusions:  PRN Meds: acetaminophen **OR** acetaminophen, ondansetron **OR** ondansetron (ZOFRAN) IV, senna-docusate  Allergies:    Allergies  Allergen Reactions   Penicillins Hives and Other (See Comments)    Has patient had a PCN reaction causing immediate rash, facial/tongue/throat swelling, SOB or lightheadedness with hypotension:    #  #  YES  #  #  Has patient had a PCN reaction causing severe rash involving mucus membranes or skin necrosis:    #  #  YES  #  #  Has patient had a PCN reaction that required hospitalization: No Has patient had a PCN reaction occurring within the last 10 years: No   Has patient had a PCN reaction causing immediate rash, facial/tongue/throat swelling, SOB or lightheadedness with hypotension: Yes Has patient had a PCN reaction causing severe rash involving mucus membranes or skin necrosis: Yes Has patient had a PCN reaction that required hospitalization: No Has patient had a PCN reaction occurring within the last 10 years: No If all of the above answers are "NO", then may proceed with Cephalosporin use. Has patient had a PCN reaction causing immediate rash,  facial/tongue/throat swelling, SOB or lightheadedness with hypotension:    #  #  YES  #  #  Has patient had a PCN reaction causing severe rash involving mucus membranes or skin necrosis:    #  #  YES  #  #  Has patient had a PCN reaction that required hospitalization: No Has patient had a PCN reaction occurring within the last 10 years: No   Atorvastatin Itching    Redness and itching all over body    Codeine Nausea And Vomiting    Patient and daughter in law at bedside stated that patient doesn't have any problems with taking this.    Social History:   Social History   Socioeconomic History   Marital status: Married    Spouse name: Not on file   Number of children: 2   Years of education: 12   Highest education level: Not on file  Occupational History   Occupation: child care  Tobacco Use   Smoking status: Never    Passive exposure: Never   Smokeless tobacco: Never  Vaping Use   Vaping Use: Never used  Substance and Sexual Activity   Alcohol use: No   Drug use: No   Sexual activity: Not on file  Other Topics Concern   Not on file  Social History Narrative   Lives with husband in a one story home.  Has 2 children.  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  Education: high school.     Social Determinants of Health   Financial Resource Strain: Not on file  Food Insecurity: Not on file  Transportation Needs: Not on file  Physical Activity: Not on file  Stress: Not on file  Social Connections: Not on file  Intimate Partner Violence: Not on file    Family History:   Family History  Problem Relation Age of Onset   Alzheimer's disease Mother    Aneurysm Father    Stomach cancer Brother      ROS:  Please see the history of present illness.  No fever, chills, cough. She has chronic pain in her back due to postherpetic neuralgia.  All other ROS reviewed and negative.     Physical Exam/Data:   Vitals:   08/06/22 0029 08/06/22 0434 08/06/22 0732 08/06/22 1158  BP: (!)  108/54 95/60 111/61  110/63  Pulse: (!) 104 99 96 (!) 101  Resp: 18 18 17 17   Temp: 97.6 F (36.4 C) 97.7 F (36.5 C) 97.6 F (36.4 C) 98.3 F (36.8 C)  TempSrc: Oral Oral Oral Oral  SpO2: 94% 93% 94% 94%  Weight:      Height:        Intake/Output Summary (Last 24 hours) at 08/06/2022 1240 Last data filed at 08/06/2022 0900 Gross per 24 hour  Intake 356 ml  Output --  Net 356 ml      08/05/2022    7:19 AM 01/08/2022    7:07 PM 10/27/2021    8:52 AM  Last 3 Weights  Weight (lbs) 215 lb 235 lb 14.3 oz 235 lb 11.2 oz  Weight (kg) 97.523 kg 107 kg 106.913 kg     Body mass index is 35.23 kg/m.  General:  Well nourished, well developed, in no acute distress  HEENT: normal Neck: no JVD Vascular:  Distal pulses 2+ bilaterally Cardiac:  rapid regular rhythm, 2/6 systolic murmur Lungs:  clear to auscultation bilaterally, no wheezing, rhonchi or rales  Abd: soft, nontender, no hepatomegaly  Ext: no edema Musculoskeletal:  No deformities Skin: warm and dry  Neuro:  CNs 2-12 intact, no focal abnormalities noted Psych:  Normal affect   EKG:  The EKG was personally reviewed and demonstrates:  Ectopic atrial tachycardia, HR 114, QTc 489 Telemetry:  Telemetry was personally reviewed and demonstrates:  ectopic atrial tachycardia with intermittent sinus brady.  Relevant CV Studies:    Laboratory Data:  High Sensitivity Troponin:  No results for input(s): "TROPONINIHS" in the last 720 hours.   Chemistry Recent Labs  Lab 08/05/22 0811 08/06/22 0632  NA 141 140  K 3.8 4.3  CL 108 108  CO2 21* 26  GLUCOSE 87 110*  BUN 27* 18  CREATININE 1.17* 1.07*  CALCIUM 8.8* 8.3*  MG  --  2.2  GFRNONAA 48* 54*  ANIONGAP 12 6    Recent Labs  Lab 08/05/22 0811 08/06/22 0632  PROT 6.5  --   ALBUMIN 3.5 2.9*  AST 19  --   ALT 13  --   ALKPHOS 37*  --   BILITOT 0.7  --    Lipids No results for input(s): "CHOL", "TRIG", "HDL", "LABVLDL", "LDLCALC", "CHOLHDL" in the last 168 hours.   Hematology Recent Labs  Lab 08/05/22 0811 08/05/22 1749 08/06/22 0632  WBC 10.4  --  5.4  RBC 4.03  --  3.48*  HGB 12.0 10.8* 10.7*  HCT 38.8  --  33.2*  MCV 96.3  --  95.4  MCH 29.8  --  30.7  MCHC 30.9  --  32.2  RDW 12.4  --  12.6  PLT 145*  --  129*   Thyroid No results for input(s): "TSH", "FREET4" in the last 168 hours.  BNPNo results for input(s): "BNP", "PROBNP" in the last 168 hours.  DDimer No results for input(s): "DDIMER" in the last 168 hours.   Radiology/Studies:  No results found.   Assessment and Plan:   Ectopic Atrial Tachycardia. Discussed with Dr. Jens Som who reviewed with Dr. Graciela Husbands. The pt did not have obstructive CAD on her cath. Will start Flecainide 50 mg twice daily to suppress atrial tachycardia. She has had Carvedilol on hold while here due to soft BP. Will start Metoprolol succinate 12.5 mg for rate control.    Risk Assessment/Risk Scores:     For questions or updates, please contact [facility]  Please consult [email or web address] for contact info under   Signed, Tereso Newcomer, PA-C  08/06/2022 12:40 PM  As above, patient seen and examined.  Briefly she is a 77 year old female with past medical history of aortic stenosis status post TAVR, nonobstructive coronary disease, hypertension, breast cancer, history of DVT previously on anticoagulation admitted with GI bleed felt secondary to hemorrhoids for evaluation of ectopic atrial tachycardia.  Note cardiac catheterization December 2020 prior to TAVR showed 40% proximal to mid LAD and otherwise luminal irregularities.  She denies dyspnea, chest pain, palpitations or syncope.  Admitted with hematochezia felt to be secondary to hemorrhoids.  On telemetry she was noted to be tachycardic and cardiology asked to evaluate.  I have personally reviewed the patient's telemetry.  She is noted to have ectopic atrial tachycardia with heart rate 110-120 with intermittent bradycardia with heart rates in the 40s.   Cardiology now asked to evaluate.  Last echocardiogram May 2023 showed ejection fraction 55 to 60%, moderate left ventricular hypertrophy, grade 2 diastolic dysfunction, severe left atrial enlargement, prior aortic valve replacement with mild aortic insufficiency.  Laboratory showed potassium 4.3, creatinine 1.07, hemoglobin 10.7.  Electrocardiogram shows ectopic atrial tachycardia at a rate of 114.  Telemetry personally reviewed and shows ectopic atrial tachycardia intermittently converting to sinus with heart rates in the 40s and 50s.  1 ectopic atrial tachycardia-I have reviewed the patient's ECG and rhythm strips with Dr. Graciela Husbands.  He suggested flecainide 50 mg twice daily to suppress arrhythmia (cardiac catheterization in 2020 as outlined above showed mild nonobstructive coronary disease).  Will continue beta-blocker but will transition to metoprolol 12.5 mg twice daily.  Follow closely for any evidence of worsening bradycardia or tachyarrhythmias.  If this continues to be problematic ablation could be considered in the future.  Note amiodarone was felt not to be a good option due to baseline bradycardia.  Note patient will need exercise treadmill 2 weeks after discharge to rule out exercise-induced arrhythmias.  2 status post TAVR-continue SBE prophylaxis.  3 GI bleed-follow-up gastroenterology for banding of hemorrhoids.  Olga Millers, MD

## 2022-08-06 NOTE — Plan of Care (Signed)

## 2022-08-06 NOTE — Hospital Course (Addendum)
Elizabeth [name] is a 77 year-old female with a past medical history of breast cancer, anxiety, CKDIIIA, GERD, hypertension, diverticulosis, severe aortic stenosis s/p TAVR in 2021 who presented for evaluation of bright red blood per rectum and admitted on 5/24 for lower GI bleed.    Lower GI bleed Rectal bleeding Hx of internal and external hemorrhoids Patient presenting with bright red blood per rectum. Hgb was at baseline during admission. Physical exam notable for internal and external hemorrhoids. GI was consulted with recommendations for close outpatient follow-up for banding and hydrocortisone suppository BID.  Ectopic Atrial Tachycardia Severe AS s/p TAVR Chronic diastolic heart failure Patient tachycardic with occasional skipped beats on exam. Telemetry showing tachycardia in 100-110s with intermittent episodes of bradycardia in 40s. Patient denies any chest pain, shortness of breath, palpitations, dyspnea on exertion, or dizziness. TTE 1 year ago showed EF 55-60%, moderate LVH, G2DD, and severely dilated LA. Patient scheduled for outpatient cardiology appointment on 08/09/2022 to schedule repeat Echo. She is concerned about returning home. Consulted cardiology to evaluate tachycardia and intermittent bradycardia and for medication recommendations at discharge. Cardiology started flecainide 50 mg BID and metoprolol 12.5 mg for ectopic atrial tachycardia. If this continues to be problematic ablation could be considered in the future.  Note amiodarone was felt not to be a good option due to baseline bradycardia.  Note patient will need exercise treadmill 2 weeks after discharge to rule out exercise-induced arrhythmias.    Hypertension Blood pressures low-normal with MAPs in 70s. Will continue holding home antihypertensives.   CKD 3A  Creatinine at baseline at 1.07 with GFR 54.   Postherpetic neuralgia  Patient experiencing pain in dermatomal distribution on back. She received oxycodone 5 mg x 1  overnight because of severe pain. Will continue gabapentin and lidocaine patches.   Hx breast cancer  Will continue home tamoxifen.   GERD  Will continue home Protonix.   5/27: Doing well this morning, wants to go home today. Denies chest pain, palpitations or skipped beats, dyspnea, basically asymptomatic. Cardiologist came by and they discussed episodes of atrial tachycardia and ongoing medicaiton adjustments.

## 2022-08-07 ENCOUNTER — Inpatient Hospital Stay (HOSPITAL_COMMUNITY): Payer: Medicare Other

## 2022-08-07 DIAGNOSIS — Z952 Presence of prosthetic heart valve: Secondary | ICD-10-CM

## 2022-08-07 DIAGNOSIS — I4719 Other supraventricular tachycardia: Secondary | ICD-10-CM | POA: Diagnosis not present

## 2022-08-07 LAB — ECHOCARDIOGRAM COMPLETE
AR max vel: 2.29 cm2
AV Area VTI: 2.2 cm2
AV Area mean vel: 2.19 cm2
AV Mean grad: 10.5 mmHg
AV Peak grad: 16.9 mmHg
Ao pk vel: 2.06 m/s
Area-P 1/2: 4.19 cm2
Height: 65.5 in
S' Lateral: 4.05 cm
Weight: 3440 oz

## 2022-08-07 LAB — CBC
HCT: 33.5 % — ABNORMAL LOW (ref 36.0–46.0)
Hemoglobin: 10.9 g/dL — ABNORMAL LOW (ref 12.0–15.0)
MCH: 30.7 pg (ref 26.0–34.0)
MCHC: 32.5 g/dL (ref 30.0–36.0)
MCV: 94.4 fL (ref 80.0–100.0)
Platelets: 113 10*3/uL — ABNORMAL LOW (ref 150–400)
RBC: 3.55 MIL/uL — ABNORMAL LOW (ref 3.87–5.11)
RDW: 12.3 % (ref 11.5–15.5)
WBC: 5.7 10*3/uL (ref 4.0–10.5)
nRBC: 0 % (ref 0.0–0.2)

## 2022-08-07 LAB — HEMOGLOBIN: Hemoglobin: 11.2 g/dL — ABNORMAL LOW (ref 12.0–15.0)

## 2022-08-07 MED ORDER — OXYCODONE HCL 5 MG PO TABS
2.5000 mg | ORAL_TABLET | Freq: Once | ORAL | Status: AC
  Administered 2022-08-07: 2.5 mg via ORAL
  Filled 2022-08-07: qty 1

## 2022-08-07 MED ORDER — OXYCODONE HCL 5 MG PO TABS
5.0000 mg | ORAL_TABLET | Freq: Every day | ORAL | Status: DC | PRN
  Administered 2022-08-07 – 2022-08-08 (×2): 5 mg via ORAL
  Filled 2022-08-07 (×2): qty 1

## 2022-08-07 NOTE — Progress Notes (Signed)
HD#1 Subjective:  Overnight Events: telemetry reviewed and showed intermittent episodes of tachycardia with rates into 120s and bradycardia with rates into 30s.  Patient assessed at bedside this morning. She had significant pain overnight and had difficulty sleeping. Tylenol, creme and gabapentin did not help. Pain was more tolerable after oxycodone dose.  Her PCP recently discontinued gabapentin as it had not been working and wanted her to follow-up with a clinic across from [place]. She has been taking oxycodone at home on occasions when pain is not tolerable. She denies chest pain, dizziness, and palpitations.  Pt is updated on the plan for today, and all questions and concerns are addressed.   Objective:  Vital signs in last 24 hours: Vitals:   08/06/22 2049 08/07/22 0023 08/07/22 0359 08/07/22 0807  BP: (!) 117/54 102/64 111/65 132/78  Pulse: 99 92 92 94  Resp: 18 18 18 18   Temp: (!) 97.5 F (36.4 C) 98.1 F (36.7 C) 97.9 F (36.6 C) 98.1 F (36.7 C)  TempSrc: Oral   Oral  SpO2: 92% 92% 97% 97%  Weight:      Height:       Supplemental O2: Room Air SpO2: 97 %   Physical Exam:  Constitutional: well-appearing, in no acute distress Cardiovascular: regular rate and rhythm, 2/6 murmur best heard in aortic post Pulmonary/Chest: normal work of breathing on room air, lungs clear to auscultation bilaterally Skin: warm and dry Psych: pleasant  Filed Weights   08/05/22 0719  Weight: 97.5 kg     Intake/Output Summary (Last 24 hours) at 08/07/2022 1015 Last data filed at 08/07/2022 0915 Gross per 24 hour  Intake 240 ml  Output --  Net 240 ml   Net IO Since Admission: 596 mL [08/07/22 1015]  Pertinent Labs:    Latest Ref Rng & Units 08/07/2022    6:29 AM 08/06/2022    5:41 PM 08/06/2022    6:32 AM  CBC  WBC 4.0 - 10.5 K/uL 5.7   5.4   Hemoglobin 12.0 - 15.0 g/dL 16.1  09.6  04.5   Hematocrit 36.0 - 46.0 % 33.5   33.2   Platelets 150 - 400 K/uL 113   129         Latest Ref Rng & Units 08/06/2022    6:32 AM 08/05/2022    8:11 AM 01/11/2022   12:43 PM  CMP  Glucose 70 - 99 mg/dL 409  87  811   BUN 8 - 23 mg/dL 18  27  15    Creatinine 0.44 - 1.00 mg/dL 9.14  7.82  9.56   Sodium 135 - 145 mmol/L 140  141  137   Potassium 3.5 - 5.1 mmol/L 4.3  3.8  4.1   Chloride 98 - 111 mmol/L 108  108  104   CO2 22 - 32 mmol/L 26  21  22    Calcium 8.9 - 10.3 mg/dL 8.3  8.8  8.6   Total Protein 6.5 - 8.1 g/dL  6.5    Total Bilirubin 0.3 - 1.2 mg/dL  0.7    Alkaline Phos 38 - 126 U/L  37    AST 15 - 41 U/L  19    ALT 0 - 44 U/L  13      Imaging: No results found.  Assessment/Plan:   Principal Problem:   Lower GI bleed Active Problems:   Grade III hemorrhoids   Rectal bleeding   Patient Summary: Elizabeth [name] is a 77 y.o.  with a pertinent PMH of aortic stenosis s/p TAVR, nonobstructive coronary disease, breast cancer who presented with GI bleed found to have hemorrhoids. No further episodes of bleeding however she was noted to have episodes of tachycardia and bradycardia on telemetry, found to have ectopic atrial tachycardia.    Ectopic atrial tachycardia Bradycardia On review of telemetry, she continues to have episodes of tachycardia despite starting medications. Appreciate cardiology assistance in this case. They recommend continuing flecainide 50 mg BID and metoprolol with tele monitoring overnight. If tachycardia does not improve, they may consider ablation.  -continue flecainide 50 mg BID -continue metoprolol 12.5 mg qd -telemonitoring  Severe AS s/p TAVR Chronic diastolic heart failure She was scheduled for repeat echo on Tuesday. Will go ahead and obtain that during this admission as she will need to remain here for close monitoring. -f/u echo  Lower GI bleed Hx of internal and external hemorrhoids No further episodes of bleeding. Hemoglobin is stable at 10.9 -trend CBC  Postherpetic neuralgia Patient experiencing pain  in dermatomal distribution on back into right chest. She has not found gabapentin to be helpful and her PCP just discontinued last week as she hasn't seen benefit after taking for 3 months. She does take oxycodone on occasion at home for this that is prescribed by PCP.  - oxycodone 5 mg daily PRN - discontinue Gabapentin 200 mg TID - Lidocaine patch   Diet: Normal VTE: Enoxaparin Code: Full PT/OT recs: None.  Dispo: Anticipated discharge to Home in 1 days pending improvement in tachycardia episodes.   Octavie Westerhold M. Kierah Goatley, D.O.  Internal Medicine Resident, PGY-2 [facility]  Pager: [phone] 10:15 AM, 08/07/2022   **Please contact the on call pager after 5 pm and on weekends at [phone].**

## 2022-08-07 NOTE — Plan of Care (Signed)

## 2022-08-07 NOTE — Progress Notes (Signed)
  Echocardiogram 2D Echocardiogram has been performed.  Elizabeth [name] Wynn Banker 08/07/2022, 12:51 PM

## 2022-08-07 NOTE — Progress Notes (Signed)
Rounding Note    Patient Name: Elizabeth [name] Date of Encounter: 08/07/2022  [facility] Cardiologist: Dr Dulce Sellar  Subjective   No CP or dyspnea; continues to complain of back pain from residual shingles.  Inpatient Medications    Scheduled Meds:  enoxaparin (LOVENOX) injection  40 mg Subcutaneous QHS   flecainide  50 mg Oral Q12H   gabapentin  200 mg Oral TID   hydrocortisone  25 mg Rectal BID   lidocaine  1 patch Transdermal Q24H   metoprolol succinate  12.5 mg Oral Daily   pantoprazole  40 mg Oral Daily   ramelteon  8 mg Oral QHS   tamoxifen  20 mg Oral Daily   Continuous Infusions:  PRN Meds: acetaminophen **OR** acetaminophen, ondansetron **OR** ondansetron (ZOFRAN) IV, senna-docusate   Vital Signs    Vitals:   08/06/22 2049 08/07/22 0023 08/07/22 0359 08/07/22 0807  BP: (!) 117/54 102/64 111/65 132/78  Pulse: 99 92 92 94  Resp: 18 18 18 18   Temp: (!) 97.5 F (36.4 C) 98.1 F (36.7 C) 97.9 F (36.6 C) 98.1 F (36.7 C)  TempSrc: Oral   Oral  SpO2: 92% 92% 97% 97%  Weight:      Height:        Intake/Output Summary (Last 24 hours) at 08/07/2022 0909 Last data filed at 08/06/2022 1410 Gross per 24 hour  Intake 120 ml  Output --  Net 120 ml      08/05/2022    7:19 AM 01/08/2022    7:07 PM 10/27/2021    8:52 AM  Last 3 Weights  Weight (lbs) 215 lb 235 lb 14.3 oz 235 lb 11.2 oz  Weight (kg) 97.523 kg 107 kg 106.913 kg      Telemetry    Intermittent sinus rhythm with atrial tachycardia; and atrial tachycardia at 110 this morning.- Personally Reviewed    GEN: No acute distress.   Neck: No JVD Cardiac: Tachycardic and regular Respiratory: Clear to auscultation bilaterally. GI: Soft, nontender, non-distended  MS: No edema Neuro:  Nonfocal  Psych: Normal affect   Labs     Chemistry Recent Labs  Lab 08/05/22 0811 08/06/22 0632  NA 141 140  K 3.8 4.3  CL 108 108  CO2 21* 26  GLUCOSE 87 110*  BUN 27* 18  CREATININE  1.17* 1.07*  CALCIUM 8.8* 8.3*  MG  --  2.2  PROT 6.5  --   ALBUMIN 3.5 2.9*  AST 19  --   ALT 13  --   ALKPHOS 37*  --   BILITOT 0.7  --   GFRNONAA 48* 54*  ANIONGAP 12 6     Hematology Recent Labs  Lab 08/05/22 0811 08/05/22 1749 08/06/22 0632 08/06/22 1741 08/07/22 0629  WBC 10.4  --  5.4  --  5.7  RBC 4.03  --  3.48*  --  3.55*  HGB 12.0   < > 10.7* 11.0* 10.9*  HCT 38.8  --  33.2*  --  33.5*  MCV 96.3  --  95.4  --  94.4  MCH 29.8  --  30.7  --  30.7  MCHC 30.9  --  32.2  --  32.5  RDW 12.4  --  12.6  --  12.3  PLT 145*  --  129*  --  113*   < > = values in this interval not displayed.     Patient Profile      77 year old female with past medical history  of aortic stenosis status post TAVR, nonobstructive coronary disease, hypertension, breast cancer, history of DVT previously on anticoagulation admitted with GI bleed felt secondary to hemorrhoids for evaluation of ectopic atrial tachycardia. Note cardiac catheterization December 2020 prior to TAVR showed 40% proximal to mid LAD and otherwise luminal irregularities. She denies dyspnea, chest pain, palpitations or syncope. Admitted with hematochezia felt to be secondary to hemorrhoids. On telemetry she was noted to be tachycardic and cardiology asked to evaluate. I have personally reviewed the patient's telemetry. She is noted to have ectopic atrial tachycardia with heart rate 110-120 with intermittent bradycardia with heart rates in the 40s. Cardiology now asked to evaluate. Last echocardiogram May 2023 showed ejection fraction 55 to 60%, moderate left ventricular hypertrophy, grade 2 diastolic dysfunction, severe left atrial enlargement, prior aortic valve replacement with mild aortic insufficiency. Laboratory showed potassium 4.3, creatinine 1.07, hemoglobin 10.7. Electrocardiogram shows ectopic atrial tachycardia at a rate of 114. Telemetry personally reviewed and shows ectopic atrial tachycardia intermittently converting  to sinus with heart rates in the 40s and 50s.   Assessment & Plan    1 ectopic atrial tachycardia-I previously reviewed the patient's electrocardiogram and rhythm strips with Dr. Graciela Husbands.  We feel this is an ectopic atrial tachycardia.  He recommended beginning flecainide for suppression.  She is presently receiving 50 mg twice daily but continues to have ectopic atrial tachycardia.  Will continue for now.  Continue metoprolol.  As outlined previously she had an elevated calcium score in the past but previous catheterization revealed nonobstructive disease (40% LAD and otherwise luminal irregularities).  If flecainide suppresses the rhythm disturbance will need exercise treadmill 1 to 2 weeks following discharge to rule out exercise-induced VT.  If arrhythmia persist despite flecainide may need to consider ablation.  Note we did not add amiodarone due to intermittent bradycardia.  Will repeat echocardiogram.     2 status post TAVR-continue SBE prophylaxis.  Repeat echocardiogram.   3 GI bleed-follow-up gastroenterology for banding of hemorrhoids.  For questions or updates, please contact [facility] Please consult [email or web address] for contact info under        Signed, Olga Millers, MD  08/07/2022, 9:09 AM

## 2022-08-08 ENCOUNTER — Other Ambulatory Visit: Payer: Self-pay | Admitting: Student

## 2022-08-08 DIAGNOSIS — I4719 Other supraventricular tachycardia: Secondary | ICD-10-CM | POA: Diagnosis not present

## 2022-08-08 DIAGNOSIS — K922 Gastrointestinal hemorrhage, unspecified: Secondary | ICD-10-CM

## 2022-08-08 LAB — CBC
HCT: 33.8 % — ABNORMAL LOW (ref 36.0–46.0)
Hemoglobin: 10.9 g/dL — ABNORMAL LOW (ref 12.0–15.0)
MCH: 31.2 pg (ref 26.0–34.0)
MCHC: 32.2 g/dL (ref 30.0–36.0)
MCV: 96.8 fL (ref 80.0–100.0)
Platelets: 127 10*3/uL — ABNORMAL LOW (ref 150–400)
RBC: 3.49 MIL/uL — ABNORMAL LOW (ref 3.87–5.11)
RDW: 12.4 % (ref 11.5–15.5)
WBC: 5.4 10*3/uL (ref 4.0–10.5)
nRBC: 0 % (ref 0.0–0.2)

## 2022-08-08 MED ORDER — SENNOSIDES-DOCUSATE SODIUM 8.6-50 MG PO TABS: 1.0000 | ORAL_TABLET | Freq: Every evening | ORAL | 0 refills | Status: AC | PRN

## 2022-08-08 MED ORDER — HYDROCORTISONE ACETATE 25 MG RE SUPP: 25.0000 mg | Freq: Two times a day (BID) | RECTAL | 0 refills | Status: DC

## 2022-08-08 MED ORDER — FLECAINIDE ACETATE 100 MG PO TABS
100.0000 mg | ORAL_TABLET | Freq: Two times a day (BID) | ORAL | Status: DC
  Filled 2022-08-08: qty 1

## 2022-08-08 MED ORDER — METOPROLOL SUCCINATE ER 25 MG PO TB24: 12.5000 mg | ORAL_TABLET | Freq: Every day | ORAL | 0 refills | Status: DC

## 2022-08-08 MED ORDER — FLECAINIDE ACETATE 100 MG PO TABS: 100.0000 mg | ORAL_TABLET | Freq: Two times a day (BID) | ORAL | 0 refills | Status: DC

## 2022-08-08 NOTE — Progress Notes (Signed)
Rounding Note    Patient Name: Elizabeth [name] Date of Encounter: 08/08/2022  [facility] Cardiologist: Dr Dulce Sellar   Patient Profile      77 year old female with past medical history of aortic stenosis status post TAVR, nonobstructive coronary disease, hypertension, breast cancer, history of DVT previously on anticoagulation admitted with GI bleed felt secondary to hemorrhoids and seen for ectopic atrial tachycardia.   Cardiac catheterization December 2020 prior to TAVR showed 40% proximal to mid LAD and otherwise luminal irregularities.  Telemetry >> tachycardic  ectopic atrial tachycardia with heart rate 110-120 with intermittent bradycardia with heart rates in the 40s.  Echocardiogram May 2023 showed ejection fraction 55 to 60%, moderate left ventricular hypertrophy, grade 2 diastolic dysfunction, severe left atrial enlargement, prior aortic valve replacement with mild aortic insufficiency. Laboratory showed potassium 4.3, creatinine 1.07, hemoglobin 10.7.  Discussed with Dr Victorio Palm >> flecainide initiated   Subjective  Unrelenting pain related to her zoster.  Largely unaware of her heartbeat  Inpatient Medications    Scheduled Meds:  enoxaparin (LOVENOX) injection  40 mg Subcutaneous QHS   flecainide  50 mg Oral Q12H   hydrocortisone  25 mg Rectal BID   lidocaine  1 patch Transdermal Q24H   metoprolol succinate  12.5 mg Oral Daily   pantoprazole  40 mg Oral Daily   ramelteon  8 mg Oral QHS   tamoxifen  20 mg Oral Daily   Continuous Infusions:  PRN Meds: acetaminophen **OR** acetaminophen, ondansetron **OR** ondansetron (ZOFRAN) IV, oxyCODONE, senna-docusate   Vital Signs    Vitals:   08/07/22 1546 08/07/22 1948 08/08/22 0434 08/08/22 0801  BP: 113/71 114/69 (!) 109/56 101/61  Pulse: 99 (!) 103 90 97  Resp: 18 18 18 18   Temp: 97.8 F (36.6 C) 98.7 F (37.1 C) 97.9 F (36.6 C) 98.8 F (37.1 C)  TempSrc: Oral Oral    SpO2: 94% 92% 100% 95%  Weight:       Height:        Intake/Output Summary (Last 24 hours) at 08/08/2022 0938 Last data filed at 08/08/2022 0905 Gross per 24 hour  Intake 120 ml  Output --  Net 120 ml       08/05/2022    7:19 AM 01/08/2022    7:07 PM 10/27/2021    8:52 AM  Last 3 Weights  Weight (lbs) 215 lb 235 lb 14.3 oz 235 lb 11.2 oz  Weight (kg) 97.523 kg 107 kg 106.913 kg      Telemetry    Well developed and nourished in no acute distress HENT normal Neck supple with JVP-  flat  Clear Rapid but regular rate and rhythm, no murmurs or gallops Abd-soft with active BS No Clubbing cyanosis edema Skin-warm and dry A & Oriented  Grossly normal sensory and motor function  Telemetry Personally reviewed incessant atrial tachycardia  Labs     Chemistry Recent Labs  Lab 08/05/22 0811 08/06/22 0632  NA 141 140  K 3.8 4.3  CL 108 108  CO2 21* 26  GLUCOSE 87 110*  BUN 27* 18  CREATININE 1.17* 1.07*  CALCIUM 8.8* 8.3*  MG  --  2.2  PROT 6.5  --   ALBUMIN 3.5 2.9*  AST 19  --   ALT 13  --   ALKPHOS 37*  --   BILITOT 0.7  --   GFRNONAA 48* 54*  ANIONGAP 12 6      Hematology Recent Labs  Lab 08/06/22 [phone] 08/06/22 1741 08/07/22  1610 08/07/22 1733 08/08/22 0701  WBC 5.4  --  5.7  --  5.4  RBC 3.48*  --  3.55*  --  3.49*  HGB 10.7*   < > 10.9* 11.2* 10.9*  HCT 33.2*  --  33.5*  --  33.8*  MCV 95.4  --  94.4  --  96.8  MCH 30.7  --  30.7  --  31.2  MCHC 32.2  --  32.5  --  32.2  RDW 12.6  --  12.3  --  12.4  PLT 129*  --  113*  --  127*   < > = values in this interval not displayed.       Assessment & Plan    Atrial tachycardia.  Long RP  Bradycardia  Aortic stenosis status post TAVR  GI bleed with anemia secondary to hemorrhoids  Incessant ongoing long RP tachycardia.  Probably atrial tachycardia P wave morphologies do not suggest PJRT  Will increase the flecainide today from 50--100 mg twice daily.  This can be managed as an outpatient.  She has wearable technology in  the form of an Apple watch.  Would need to arrange early EP follow-up within a week.  Okay from my perspective for her to go home given the issues related to pain from her zoster and the challenges of lying in the hospital bed  For questions or updates, please contact [facility] Please consult [email or web address] for contact info under        Signed, Sherryl Manges, MD  08/08/2022, 9:38 AM

## 2022-08-08 NOTE — TOC Transition Note (Signed)
Transition of Care [facility]) - CM/SW Discharge Note   Patient Details  Name: Marilena Gorelick MRN: [id number] Date of Birth: 10/02/1945  Transition of Care [employer] [facility]) CM/SW Contact:  Ronny Bacon, RN Phone Number: 08/08/2022, 12:51 PM   Clinical Narrative:   Chart reviewed no TOC needs noted. Patient as family support system in place.    Final next level of care: Home/Self Care Barriers to Discharge: No Barriers Identified   Patient Goals and CMS Choice      Discharge Placement                         Discharge Plan and Services Additional resources added to the After Visit Summary for                                       Social Determinants of Health (SDOH) Interventions SDOH Screenings   Depression (PHQ2-9): Low Risk  (07/01/2020)  Tobacco Use: Low Risk  (08/05/2022)     Readmission Risk Interventions     No data to display

## 2022-08-08 NOTE — Plan of Care (Signed)

## 2022-08-08 NOTE — Progress Notes (Signed)
Progress Note  Patient Name: Elizabeth [name] Date of Encounter: 08/08/2022  Primary Cardiologist: None   Subjective   Patient seen and examined at her bedside.   Inpatient Medications    Scheduled Meds:  enoxaparin (LOVENOX) injection  40 mg Subcutaneous QHS   flecainide  50 mg Oral Q12H   hydrocortisone  25 mg Rectal BID   lidocaine  1 patch Transdermal Q24H   metoprolol succinate  12.5 mg Oral Daily   pantoprazole  40 mg Oral Daily   ramelteon  8 mg Oral QHS   tamoxifen  20 mg Oral Daily   Continuous Infusions:  PRN Meds: acetaminophen **OR** acetaminophen, ondansetron **OR** ondansetron (ZOFRAN) IV, oxyCODONE, senna-docusate   Vital Signs    Vitals:   08/07/22 1546 08/07/22 1948 08/08/22 0434 08/08/22 0801  BP: 113/71 114/69 (!) 109/56 101/61  Pulse: 99 (!) 103 90 97  Resp: 18 18 18 18   Temp: 97.8 F (36.6 C) 98.7 F (37.1 C) 97.9 F (36.6 C) 98.8 F (37.1 C)  TempSrc: Oral Oral    SpO2: 94% 92% 100% 95%  Weight:      Height:        Intake/Output Summary (Last 24 hours) at 08/08/2022 0904 Last data filed at 08/07/2022 0915 Gross per 24 hour  Intake 120 ml  Output --  Net 120 ml   Filed Weights   08/05/22 0719  Weight: 97.5 kg    Telemetry    Atrial tachy HR low 100s - Personally Reviewed  ECG    None today  - Personally Reviewed  Physical Exam    General: Comfortable  Head: Atraumatic, normal size  Eyes: PEERLA, EOMI  Neck: Supple, normal JVD Cardiac: Normal S1, S2; RRR; no murmurs, rubs, or gallops Lungs: Clear to auscultation bilaterally Abd: Soft, nontender, no hepatomegaly  Ext: warm, no edema Musculoskeletal: No deformities, BUE and BLE strength normal and equal Skin: Warm and dry, no rashes   Neuro: Alert and oriented to person, place, time, and situation, CNII-XII grossly intact, no focal deficits  Psych: Normal mood and affect   Labs    Chemistry Recent Labs  Lab 08/05/22 0811 08/06/22 0632  NA 141 140  K 3.8  4.3  CL 108 108  CO2 21* 26  GLUCOSE 87 110*  BUN 27* 18  CREATININE 1.17* 1.07*  CALCIUM 8.8* 8.3*  PROT 6.5  --   ALBUMIN 3.5 2.9*  AST 19  --   ALT 13  --   ALKPHOS 37*  --   BILITOT 0.7  --   GFRNONAA 48* 54*  ANIONGAP 12 6     Hematology Recent Labs  Lab 08/06/22 1610 08/06/22 1741 08/07/22 0629 08/07/22 1733 08/08/22 0701  WBC 5.4  --  5.7  --  5.4  RBC 3.48*  --  3.55*  --  3.49*  HGB 10.7*   < > 10.9* 11.2* 10.9*  HCT 33.2*  --  33.5*  --  33.8*  MCV 95.4  --  94.4  --  96.8  MCH 30.7  --  30.7  --  31.2  MCHC 32.2  --  32.5  --  32.2  RDW 12.6  --  12.3  --  12.4  PLT 129*  --  113*  --  127*   < > = values in this interval not displayed.    Cardiac EnzymesNo results for input(s): "TROPONINI" in the last 168 hours. No results for input(s): "TROPIPOC" in the last 168 hours.  BNPNo results for input(s): "BNP", "PROBNP" in the last 168 hours.   DDimer No results for input(s): "DDIMER" in the last 168 hours.   Radiology    ECHOCARDIOGRAM COMPLETE  Result Date: 08/07/2022    ECHOCARDIOGRAM REPORT   Patient Name:   Elizabeth [name] Date of Exam: 08/07/2022 Medical Rec #:  [id number]            Height:       65.5 in Accession #:    [id number]           Weight:       215.0 lb Date of Birth:  Dec 03, 1945            BSA:          2.052 m Patient Age:    76 years             BP:           132/78 mmHg Patient Gender: F                    HR:           99 bpm. Exam Location:  Inpatient Procedure: 2D Echo Indications:    Aortic valve dx  History:        Patient has prior history of Echocardiogram examinations. Aortic                 Valve Disease.  Sonographer:    Lucy Antigua Referring Phys: [id number] JULIE ANNE WILLIAMS IMPRESSIONS  1. Left ventricular ejection fraction, by estimation, is 55 to 60%. The left ventricle has normal function. The left ventricle has no regional wall motion abnormalities. Left ventricular diastolic parameters are indeterminate.  2. Right  ventricular systolic function is normal. The right ventricular size is normal.  3. Left atrial size was mildly dilated.  4. The mitral valve is degenerative. No evidence of mitral valve regurgitation. No evidence of mitral stenosis. Moderate mitral annular calcification.  5. Tricuspid valve regurgitation is mild to moderate.  6. Post TAVR with 29 mm Medtronic Evolut valve stable low gradients mild PVL seen at 3:00 on PSSX views . The aortic valve has been repaired/replaced. Aortic valve regurgitation is not visualized. No aortic stenosis is present.  7. The inferior vena cava is normal in size with greater than 50% respiratory variability, suggesting right atrial pressure of 3 mmHg. FINDINGS  Left Ventricle: Left ventricular ejection fraction, by estimation, is 55 to 60%. The left ventricle has normal function. The left ventricle has no regional wall motion abnormalities. The left ventricular internal cavity size was normal in size. There is  no left ventricular hypertrophy. Left ventricular diastolic parameters are indeterminate. Right Ventricle: The right ventricular size is normal. No increase in right ventricular wall thickness. Right ventricular systolic function is normal. Left Atrium: Left atrial size was mildly dilated. Right Atrium: Right atrial size was normal in size. Pericardium: There is no evidence of pericardial effusion. Mitral Valve: The mitral valve is degenerative in appearance. There is mild thickening of the mitral valve leaflet(s). There is mild calcification of the mitral valve leaflet(s). Moderate mitral annular calcification. No evidence of mitral valve regurgitation. No evidence of mitral valve stenosis. Tricuspid Valve: The tricuspid valve is normal in structure. Tricuspid valve regurgitation is mild to moderate. No evidence of tricuspid stenosis. Aortic Valve: Post TAVR with 29 mm Medtronic Evolut valve stable low gradients mild PVL seen at 3:00 on PSSX views. The aortic valve has been  repaired/replaced. Aortic valve regurgitation is not visualized. No aortic stenosis is present. Aortic valve mean gradient measures 10.5 mmHg. Aortic valve peak gradient measures 16.9 mmHg. Aortic valve area, by VTI measures 2.20 cm. Pulmonic Valve: The pulmonic valve was normal in structure. Pulmonic valve regurgitation is not visualized. No evidence of pulmonic stenosis. Aorta: The aortic root is normal in size and structure. Venous: The inferior vena cava is normal in size with greater than 50% respiratory variability, suggesting right atrial pressure of 3 mmHg. IAS/Shunts: No atrial level shunt detected by color flow Doppler.  LEFT VENTRICLE PLAX 2D LVIDd:         5.00 cm   Diastology LVIDs:         4.05 cm   LV e' medial:    7.15 cm/s LV PW:         1.15 cm   LV E/e' medial:  15.2 LV IVS:        1.00 cm   LV e' lateral:   8.24 cm/s LVOT diam:     2.40 cm   LV E/e' lateral: 13.2 LV SV:         107 LV SV Index:   52 LVOT Area:     4.52 cm  RIGHT VENTRICLE RV S prime:     9.79 cm/s TAPSE (M-mode): 2.3 cm LEFT ATRIUM             Index        RIGHT ATRIUM           Index LA Vol (A2C):   57.0 ml 27.78 ml/m  RA Area:     12.80 cm LA Vol (A4C):   77.8 ml 37.92 ml/m  RA Volume:   26.00 ml  12.67 ml/m LA Biplane Vol: 67.5 ml 32.90 ml/m  AORTIC VALVE AV Area (Vmax):    2.29 cm AV Area (Vmean):   2.19 cm AV Area (VTI):     2.20 cm AV Vmax:           205.50 cm/s AV Vmean:          153.000 cm/s AV VTI:            0.486 m AV Peak Grad:      16.9 mmHg AV Mean Grad:      10.5 mmHg LVOT Vmax:         104.00 cm/s LVOT Vmean:        74.100 cm/s LVOT VTI:          0.236 m LVOT/AV VTI ratio: 0.49 MITRAL VALVE MV Area (PHT): 4.19 cm     SHUNTS MV Decel Time: 181 msec     Systemic VTI:  0.24 m MV E velocity: 109.00 cm/s  Systemic Diam: 2.40 cm MV A velocity: 73.10 cm/s MV E/A ratio:  1.49 Charlton Haws MD Electronically signed by Charlton Haws MD Signature Date/Time: 08/07/2022/2:40:42 PM    Final     Cardiac Studies   TTE  08/07/2022 IMPRESSIONS   1. Left ventricular ejection fraction, by estimation, is 55 to 60%. The left ventricle has normal function. The left ventricle has no regional wall motion abnormalities. Left ventricular diastolic parameters are  indeterminate.   2. Right ventricular systolic function is normal. The right ventricular size is normal.   3. Left atrial size was mildly dilated.   4. The mitral valve is degenerative. No evidence of mitral valve regurgitation. No evidence of mitral stenosis. Moderate mitral annular calcification.   5. Tricuspid valve  regurgitation is mild to moderate.   6. Post TAVR with 29 mm Medtronic Evolut valve stable low gradients mild  PVL seen at 3:00 on PSSX views . The aortic valve has been  repaired/replaced. Aortic valve regurgitation is not visualized. No aortic  stenosis is present.   7. The inferior vena cava is normal in size with greater than 50%  respiratory variability, suggesting right atrial pressure of 3 mmHg.   FINDINGS   Patient Profile    77 year old female with past medical history of aortic stenosis status post TAVR, nonobstructive coronary disease, hypertension, breast cancer, history of DVT previously on anticoagulation admitted with GI bleed felt secondary to hemorrhoids for evaluation of ectopic atrial tachycardia. Note cardiac catheterization December 2020 prior to TAVR showed 40% proximal to mid LAD and otherwise luminal irregularities. She denies dyspnea, chest pain, palpitations or syncope. Admitted with hematochezia felt to be secondary to hemorrhoids. On telemetry she was noted to be tachycardic and cardiology asked to evaluate. I have personally reviewed the patient's telemetry. She is noted to have ectopic atrial tachycardia with heart rate 110-120 with intermittent bradycardia with heart rates in the 40s. Cardiology now asked to evaluate. Last echocardiogram May 2023 showed ejection fraction 55 to 60%, moderate left ventricular hypertrophy,  grade 2 diastolic dysfunction, severe left atrial enlargement, prior aortic valve replacement with mild aortic insufficiency. Laboratory showed potassium 4.3, creatinine 1.07, hemoglobin 10.7. Electrocardiogram shows ectopic atrial tachycardia at a rate of 114. Telemetry personally reviewed and shows ectopic atrial tachycardia intermittently converting to sinus with heart rates in the 40s and 50s.   Assessment & Plan    Ectopic atrial tachycardia, noted intermittent bradycardia  Aortic stenosis status post TAVR Mild to moderate TR  Tele reviewed still with some short episode of atrial tachy, on flecainide 50 mg BID,could increase this to 75 mg BID but will defer to EP. Dr Graciela Husbands to see as well today.  Has had some bradycardia will hold off increasing the metoprolol.  If she stays on flecainide for discharge will need close follow up, for plain treadmill test.  Repeat echo TAVR valve well-seated normal gradient across the valve.  Mild to moderate TR - clinically euvolemic.     For questions or updates, please contact [facility] Please consult [email or web address] for contact info under Cardiology/STEMI.      Signed, Thomasene Ripple, DO  08/08/2022, 9:04 AM

## 2022-08-08 NOTE — Discharge Summary (Addendum)
Name: Elizabeth [name] MRN: [id number] DOB: 31-Dec-1945 77 y.o. PCP: Abner Greenspan, MD  Date of Admission: 08/05/2022  6:53 AM Date of Discharge: 08-08-2022 Attending Physician: Miguel Aschoff, MD  Discharge Diagnosis: Principal Problem:   Lower GI bleed Active Problems:   Grade III hemorrhoids   Rectal bleeding     Discharge Medications: Allergies as of 08/08/2022       Reactions   Penicillins Hives, Other (See Comments)   Has patient had a PCN reaction causing immediate rash, facial/tongue/throat swelling, SOB or lightheadedness with hypotension:    #  #  YES  #  #  Has patient had a PCN reaction causing severe rash involving mucus membranes or skin necrosis:    #  #  YES  #  #  Has patient had a PCN reaction that required hospitalization: No Has patient had a PCN reaction occurring within the last 10 years: No Has patient had a PCN reaction causing immediate rash, facial/tongue/throat swelling, SOB or lightheadedness with hypotension: Yes Has patient had a PCN reaction causing severe rash involving mucus membranes or skin necrosis: Yes Has patient had a PCN reaction that required hospitalization: No Has patient had a PCN reaction occurring within the last 10 years: No If all of the above answers are "NO", then may proceed with Cephalosporin use. Has patient had a PCN reaction causing immediate rash, facial/tongue/throat swelling, SOB or lightheadedness with hypotension:    #  #  YES  #  #  Has patient had a PCN reaction causing severe rash involving mucus membranes or skin necrosis:    #  #  YES  #  #  Has patient had a PCN reaction that required hospitalization: No Has patient had a PCN reaction occurring within the last 10 years: No   Atorvastatin Itching   Redness and itching all over body   Codeine Nausea And Vomiting   Patient and daughter in law at bedside stated that patient doesn't have any problems with taking this.        Medication List     STOP  taking these medications    amLODipine 5 MG tablet Commonly known as: NORVASC   azithromycin 500 MG tablet Commonly known as: Zithromax   carvedilol 6.25 MG tablet Commonly known as: COREG   gabapentin 100 MG capsule Commonly known as: NEURONTIN   lisinopril 10 MG tablet Commonly known as: ZESTRIL   methocarbamol 500 MG tablet Commonly known as: ROBAXIN   predniSONE 1 MG tablet Commonly known as: DELTASONE       TAKE these medications    acetaminophen 325 MG tablet Commonly known as: TYLENOL Take 3 tablets (975 mg total) by mouth every 6 (six) hours as needed for mild pain.   flecainide 100 MG tablet Commonly known as: TAMBOCOR Take 1 tablet (100 mg total) by mouth every 12 (twelve) hours.   furosemide 20 MG tablet Commonly known as: LASIX Take 1 tablet (20 mg total) by mouth every other day. TAKE 1 TABLET(20 MG) BY MOUTH DAILY   hydrocortisone 25 MG suppository Commonly known as: ANUSOL-HC Place 1 suppository (25 mg total) rectally 2 (two) times daily.   lidocaine 5 % Commonly known as: LIDODERM Place 1 patch onto the skin daily. Remove & Discard patch within 12 hours or as directed by MD   metoprolol succinate 25 MG 24 hr tablet Commonly known as: TOPROL-XL Take 0.5 tablets (12.5 mg total) by mouth daily. Start taking on: Aug 09, 2022  oxyCODONE 5 MG immediate release tablet Commonly known as: Oxy IR/ROXICODONE Take 1 tablet (5 mg total) by mouth every 6 (six) hours as needed for moderate pain or severe pain (pain not relieved by tylenol, robaxin, lidoderm).   pantoprazole 40 MG tablet Commonly known as: PROTONIX TAKE 1 TABLET(40 MG) BY MOUTH DAILY What changed: See the new instructions.   senna-docusate 8.6-50 MG tablet Commonly known as: Senokot-S Take 1 tablet by mouth at bedtime as needed for mild constipation.   tamoxifen 20 MG tablet Commonly known as: NOLVADEX Take 1 tablet (20 mg total) by mouth daily.         Disposition and  follow-up:    Elizabeth.Elizabeth [name] was discharged from [facility] in Stable condition.  At the hospital follow up visit please address:   1.  Ectopic atrial tachycardia: inquire about symptoms including palpitations and breath shortness, assess rate and rhythm, confirm adherence to flecainide and metoprolol, ensure follow up with cardiology  2.  Internal hemorrhoids: inquire about further episodes of rectal bleeding or bloody stools, perform rectal exam if indicated, confirm adherence to senna-docusate and encourage high-fiber diet, ensure follow up with gastroenterology for banding procedure   3.  Hypertension: inquire about symptoms including lightheadedness and blurry vision, check blood pressure, reconcile medication regimen of metoprolol and adjust as indicated  4.  Labs / imaging needed at time of follow-up: consider CBC, BMP, Ecg  5.  Pending labs / tests needing follow-up: none    Follow-up Appointments:  Follow-up Information     Duke Salvia, MD Follow up.   Specialty: Cardiology Why: [facility] office will call you to arrange electrophysiology follow-up within 1 week. When they call you, please make sure to clarify which location you'll be visiting. Contact information: [address] [place] [address] [phone]                Please schedule a hospital follow-up with your primary care provider Abner Greenspan MD Please schedule a follow-up visit with [facility] by calling [phone] Course by problem list:  Elizabeth [name] is a 77 year old female with a past medical history of hypertension, anxiety, breast cancer, diverticulosis, chronic kidney disease, gastroesophageal reflux disease, and severe aortic stenosis post valve replacement 2021 who presented with red blood from rectum, admitted for lower gastrointestinal bleed.       ---Ectopic atrial tachycardia with intermittent  bradycardia ---Severe aortic stenosis post valve replacement 2021 ---Chronic diastolic heart failure Patient has history of chronic diastolic heart failure and severe aortic stenosis post valve replacement 2021. Home medications include carvedilol and furosemide. Upon arrival, patient was tachycardic with regular rhythm. On hospital day two, cardiac telemetry captured alternating episodes of tachycardia and bradycardia. Cardiology evaluated and started regimen of flecainide plus metoprolol. Heart rate has stabilized to 90s since initiation of these two medications. Echocardiogram performed 5-26 without any interval changes since previous study in 07-2021 and EF remains 55-60%. Discharged with flecainide and metoprolol.      ---Acute blood loss secondary to internal hemorrhoids ---External hemorrhoids Patient has history of internal and external hemorrhoids. Presented on 5-24 with several episodes of bright red blood. Upon arrival, laboratory results demonstrated Hgb 12.0 and subsequent values have all been around 11.0. Denies any further episodes of bleeding per rectum since prior to admission. Etiology of bleed was likely internal hemorrhoids, which gastroenterology will band in the outpatient setting. Hydrocortisone suppositories were administered to minimize risk of bleeding. Senna-docusate  started to soften stools and prevent straining. Discharged with senna-docusate and recommendation to follow high-fiber diet. Encouraged to follow up with gastroenterology for hemorrhoid banding procedure.     ---Hypertension Patient has history of hypertension managed at home with lisinopril, carvedilol, amlodipine, and furosemide. Blood pressure values have remained either within normal range or slightly low throughout current hospitalization. Metoprolol started for rate control of ectopic atrial tachycardia. Home regimen discontinued given borderline-low blood pressure values and discharged with metoprolol.      ---Postherpetic neuralgia Patient has recent history of postherpetic neuralgia along dermatomal distribution involving her left chest. Managed at home with gabapentin and lidocaine patches with limited efficacy. Oxycodone added to regimen for better pain control and gabapentin discontinued given lack of benefit. Lidocaine and oxycodone continued through discharge.     ---History of breast cancer Patient has history of left breast intermediate grade ductal carcinoma in situ identified 2019. She has opted against surgery and instead takes tamoxifen. Monitored with diagnostic mammograms every six months. Most recent mammogram performed two months ago was negative for evidence of malignancy. Tamoxifen was continued throughout hospitalization and upon discharge.     Discharge Exam:    BP 101/61 (BP Location: Right Arm)   Pulse 97   Temp 98.8 F (37.1 C)   Resp 18   Ht 5' 5.5" (1.664 m)   Wt 97.5 kg   SpO2 95%   BMI 35.23 kg/m   Subjective: Patient reports feeling good this morning. Denies palpitations, chills, lightheadedness, dizziness, and chest or abdominal pain. Discussed plan to adjust heart medications for better rate control. Possible discharge later today pending clearance by cardiology.  Constitutional: well-appearing, no acute distress Cardiovascular: regular rate and rhythm, 2/6 murmur best heard in aortic post Pulmonary/Chest: normal work of breathing on room air, lungs clear to auscultation bilaterally Skin: warm and dry, multiple coalescing ecchymosis on upper right extremity  Psych: pleasant, euthymic mood with congruent affect   Pertinent Labs, Studies, and Procedures:   Labs:     Latest Ref Rng & Units 08/08/2022    7:01 AM 08/07/2022    5:33 PM 08/07/2022    6:29 AM  CBC  WBC 4.0 - 10.5 K/uL 5.4   5.7   Hemoglobin 12.0 - 15.0 g/dL 16.1  09.6  04.5   Hematocrit 36.0 - 46.0 % 33.8   33.5   Platelets 150 - 400 K/uL 127   113       Latest Ref Rng & Units  08/06/2022    6:32 AM 08/05/2022    8:11 AM 01/11/2022   12:43 PM  CMP  Glucose 70 - 99 mg/dL 409  87  811   BUN 8 - 23 mg/dL 18  27  15    Creatinine 0.44 - 1.00 mg/dL 9.14  7.82  9.56   Sodium 135 - 145 mmol/L 140  141  137   Potassium 3.5 - 5.1 mmol/L 4.3  3.8  4.1   Chloride 98 - 111 mmol/L 108  108  104   CO2 22 - 32 mmol/L 26  21  22    Calcium 8.9 - 10.3 mg/dL 8.3  8.8  8.6   Total Protein 6.5 - 8.1 g/dL  6.5    Total Bilirubin 0.3 - 1.2 mg/dL  0.7    Alkaline Phos 38 - 126 U/L  37    AST 15 - 41 U/L  19    ALT 0 - 44 U/L  13      ______________________  Imaging:  No results found.   ______________________  Procedures:  Echocardiogram performed 5-26  ______________________   Discharge Instructions:  Discharge Instructions     Diet - low sodium heart healthy   Complete by: As directed    Discharge instructions   Complete by: As directed    Elizabeth [name],  It was a pleasure taking care of you while you were in the hospital. Your rectal bleeding was caused by internal hemorrhoids. Fortunately, you never had any bleeding during your stay in the hospital. We tracked your hemoglobin, which stayed stable. Continue to insert your hydrocortisone suppositories for the next six days. Please follow up with gastroenterology for a hemorrhoid band procedure that will help prevent further bleeding episodes.    While you were here, your heart rate fluctuated between high and low readings. We started you on two new medications to control your heart rate called flecainide and metoprolol. You will continue to take these medications when you return home. Since your blood pressure has been normal, we have discontinued your home medications.   Increase activity slowly   Complete by: As directed      Elizabeth [name],  It was a pleasure taking care of you while you were in the hospital. Your rectal bleeding was caused by internal hemorrhoids. Fortunately, you never had any bleeding during your  stay in the hospital. We tracked your hemoglobin, which stayed stable. Continue to insert your hydrocortisone suppositories for the next six days. Please follow up with gastroenterology for a hemorrhoid band procedure that will help prevent further bleeding episodes.    While you were here, your heart rate fluctuated between high and low readings. We started you on two new medications to control your heart rate called flecainide and metoprolol. You will continue to take these medications when you return home. Since your blood pressure has been normal, we have discontinued your home medications.     All the best,  Signed: Lajuana Ripple, MD Internal Medicine PGY-1 Pager [phone]

## 2022-08-08 NOTE — Progress Notes (Signed)
I have sent a message to our office's EP scheduling team requesting a follow-up appointment within a week per Dr. Odessa Fleming request and our office will call the patient with this information.

## 2022-08-09 ENCOUNTER — Telehealth: Payer: Self-pay

## 2022-08-09 ENCOUNTER — Ambulatory Visit: Payer: Medicare Other

## 2022-08-09 NOTE — Telephone Encounter (Signed)
How about 6/6 at 1130?

## 2022-08-09 NOTE — Telephone Encounter (Signed)
Next opening is July 25th.  Please advise

## 2022-08-09 NOTE — Telephone Encounter (Signed)
-----   Message from Benancio Deeds, MD sent at 08/05/2022  3:42 PM EDT ----- Regarding: outpatient follow up -hemorrhoid banding Jan can you help coordinate outpatient hemorrhoid banding in the next few weeks with me for this patient. Do I have any banding slots open? Thanks. She should be discharged today or tomorrow.

## 2022-08-10 NOTE — Telephone Encounter (Signed)
Patient has not called back.  Appointment has been taken by another patient.  She has been placed in the next hem banding opening and added to wait list.

## 2022-08-10 NOTE — Telephone Encounter (Signed)
Called and left detailed message for patient to see if she can make 6-6 at 11:30am for hem banding appointment (ok to open spot per [place]).

## 2022-08-11 NOTE — Telephone Encounter (Signed)
Called and spoke to patient. She is aware of appointment on August 21st at 11:00am for hem banding.

## 2022-08-12 ENCOUNTER — Other Ambulatory Visit: Payer: Self-pay | Admitting: Cardiology

## 2022-08-13 DIAGNOSIS — I129 Hypertensive chronic kidney disease with stage 1 through stage 4 chronic kidney disease, or unspecified chronic kidney disease: Secondary | ICD-10-CM | POA: Diagnosis not present

## 2022-08-13 DIAGNOSIS — N182 Chronic kidney disease, stage 2 (mild): Secondary | ICD-10-CM | POA: Diagnosis not present

## 2022-08-13 DIAGNOSIS — E782 Mixed hyperlipidemia: Secondary | ICD-10-CM | POA: Diagnosis not present

## 2022-08-15 ENCOUNTER — Encounter: Payer: Self-pay | Admitting: Cardiovascular Disease

## 2022-08-15 ENCOUNTER — Ambulatory Visit: Payer: Medicare Other | Attending: Cardiovascular Disease | Admitting: Cardiovascular Disease

## 2022-08-16 DIAGNOSIS — L739 Follicular disorder, unspecified: Secondary | ICD-10-CM | POA: Diagnosis not present

## 2022-08-16 DIAGNOSIS — N182 Chronic kidney disease, stage 2 (mild): Secondary | ICD-10-CM | POA: Diagnosis not present

## 2022-08-16 DIAGNOSIS — I129 Hypertensive chronic kidney disease with stage 1 through stage 4 chronic kidney disease, or unspecified chronic kidney disease: Secondary | ICD-10-CM | POA: Diagnosis not present

## 2022-08-16 DIAGNOSIS — Z7689 Persons encountering health services in other specified circumstances: Secondary | ICD-10-CM | POA: Diagnosis not present

## 2022-08-16 DIAGNOSIS — I4719 Other supraventricular tachycardia: Secondary | ICD-10-CM | POA: Diagnosis not present

## 2022-08-23 DIAGNOSIS — M546 Pain in thoracic spine: Secondary | ICD-10-CM | POA: Diagnosis not present

## 2022-08-23 DIAGNOSIS — M79662 Pain in left lower leg: Secondary | ICD-10-CM | POA: Diagnosis not present

## 2022-08-23 DIAGNOSIS — R2689 Other abnormalities of gait and mobility: Secondary | ICD-10-CM | POA: Diagnosis not present

## 2022-08-23 DIAGNOSIS — M79661 Pain in right lower leg: Secondary | ICD-10-CM | POA: Diagnosis not present

## 2022-08-23 DIAGNOSIS — M62838 Other muscle spasm: Secondary | ICD-10-CM | POA: Diagnosis not present

## 2022-08-25 DIAGNOSIS — B028 Zoster with other complications: Secondary | ICD-10-CM | POA: Diagnosis not present

## 2022-08-29 DIAGNOSIS — R2689 Other abnormalities of gait and mobility: Secondary | ICD-10-CM | POA: Diagnosis not present

## 2022-08-29 DIAGNOSIS — M79662 Pain in left lower leg: Secondary | ICD-10-CM | POA: Diagnosis not present

## 2022-08-29 DIAGNOSIS — M79661 Pain in right lower leg: Secondary | ICD-10-CM | POA: Diagnosis not present

## 2022-08-29 DIAGNOSIS — M62838 Other muscle spasm: Secondary | ICD-10-CM | POA: Diagnosis not present

## 2022-08-29 DIAGNOSIS — M546 Pain in thoracic spine: Secondary | ICD-10-CM | POA: Diagnosis not present

## 2022-08-31 DIAGNOSIS — M79661 Pain in right lower leg: Secondary | ICD-10-CM | POA: Diagnosis not present

## 2022-08-31 DIAGNOSIS — R2689 Other abnormalities of gait and mobility: Secondary | ICD-10-CM | POA: Diagnosis not present

## 2022-08-31 DIAGNOSIS — M546 Pain in thoracic spine: Secondary | ICD-10-CM | POA: Diagnosis not present

## 2022-08-31 DIAGNOSIS — M62838 Other muscle spasm: Secondary | ICD-10-CM | POA: Diagnosis not present

## 2022-08-31 DIAGNOSIS — M79662 Pain in left lower leg: Secondary | ICD-10-CM | POA: Diagnosis not present

## 2022-09-05 ENCOUNTER — Other Ambulatory Visit: Payer: Self-pay | Admitting: Student

## 2022-09-05 ENCOUNTER — Other Ambulatory Visit: Payer: Self-pay | Admitting: Cardiology

## 2022-09-05 DIAGNOSIS — M5414 Radiculopathy, thoracic region: Secondary | ICD-10-CM | POA: Diagnosis not present

## 2022-09-06 ENCOUNTER — Telehealth: Payer: Self-pay

## 2022-09-06 ENCOUNTER — Other Ambulatory Visit: Payer: Self-pay | Admitting: Cardiology

## 2022-09-06 MED ORDER — METOPROLOL SUCCINATE ER 25 MG PO TB24: 12.5000 mg | ORAL_TABLET | Freq: Every day | ORAL | 0 refills | Status: DC

## 2022-09-06 NOTE — Telephone Encounter (Signed)
Received a call from the patient requesting a refill on a rx that starts with M.   Patient states she only has two pills left and her appt was moved to the middle of July and wants to know if she can get a refill to last until her appointment.   Chart reviewed and discovered the patient is taking about metoprolol.  Rx(s) sent to pharmacy electronically.  Patient agreeable and voiced understanding.

## 2022-09-06 NOTE — Telephone Encounter (Signed)
Call to patient's pharmacy [facility] in [place] informed them that refill request should be sent to patient's PCP Dr. Abner Greenspan.  Pharmacy to forward request.

## 2022-09-12 ENCOUNTER — Other Ambulatory Visit: Payer: Self-pay | Admitting: Cardiology

## 2022-09-13 DIAGNOSIS — M2560 Stiffness of unspecified joint, not elsewhere classified: Secondary | ICD-10-CM | POA: Diagnosis not present

## 2022-09-13 DIAGNOSIS — M353 Polymyalgia rheumatica: Secondary | ICD-10-CM | POA: Diagnosis not present

## 2022-09-13 DIAGNOSIS — Z79899 Other long term (current) drug therapy: Secondary | ICD-10-CM | POA: Diagnosis not present

## 2022-09-13 DIAGNOSIS — M1991 Primary osteoarthritis, unspecified site: Secondary | ICD-10-CM | POA: Diagnosis not present

## 2022-09-14 DIAGNOSIS — M79662 Pain in left lower leg: Secondary | ICD-10-CM | POA: Diagnosis not present

## 2022-09-14 DIAGNOSIS — M546 Pain in thoracic spine: Secondary | ICD-10-CM | POA: Diagnosis not present

## 2022-09-14 DIAGNOSIS — M85832 Other specified disorders of bone density and structure, left forearm: Secondary | ICD-10-CM | POA: Diagnosis not present

## 2022-09-14 DIAGNOSIS — M62838 Other muscle spasm: Secondary | ICD-10-CM | POA: Diagnosis not present

## 2022-09-14 DIAGNOSIS — M79661 Pain in right lower leg: Secondary | ICD-10-CM | POA: Diagnosis not present

## 2022-09-14 DIAGNOSIS — R2689 Other abnormalities of gait and mobility: Secondary | ICD-10-CM | POA: Diagnosis not present

## 2022-09-16 ENCOUNTER — Ambulatory Visit: Payer: Medicare Other | Admitting: Internal Medicine

## 2022-09-19 DIAGNOSIS — M79662 Pain in left lower leg: Secondary | ICD-10-CM | POA: Diagnosis not present

## 2022-09-19 DIAGNOSIS — M546 Pain in thoracic spine: Secondary | ICD-10-CM | POA: Diagnosis not present

## 2022-09-19 DIAGNOSIS — M62838 Other muscle spasm: Secondary | ICD-10-CM | POA: Diagnosis not present

## 2022-09-19 DIAGNOSIS — R2689 Other abnormalities of gait and mobility: Secondary | ICD-10-CM | POA: Diagnosis not present

## 2022-09-19 DIAGNOSIS — M79661 Pain in right lower leg: Secondary | ICD-10-CM | POA: Diagnosis not present

## 2022-09-21 DIAGNOSIS — M79662 Pain in left lower leg: Secondary | ICD-10-CM | POA: Diagnosis not present

## 2022-09-21 DIAGNOSIS — M79661 Pain in right lower leg: Secondary | ICD-10-CM | POA: Diagnosis not present

## 2022-09-21 DIAGNOSIS — R2689 Other abnormalities of gait and mobility: Secondary | ICD-10-CM | POA: Diagnosis not present

## 2022-09-21 DIAGNOSIS — M546 Pain in thoracic spine: Secondary | ICD-10-CM | POA: Diagnosis not present

## 2022-09-21 DIAGNOSIS — M62838 Other muscle spasm: Secondary | ICD-10-CM | POA: Diagnosis not present

## 2022-09-26 DIAGNOSIS — M79661 Pain in right lower leg: Secondary | ICD-10-CM | POA: Diagnosis not present

## 2022-09-26 DIAGNOSIS — M546 Pain in thoracic spine: Secondary | ICD-10-CM | POA: Diagnosis not present

## 2022-09-26 DIAGNOSIS — M62838 Other muscle spasm: Secondary | ICD-10-CM | POA: Diagnosis not present

## 2022-09-26 DIAGNOSIS — R2689 Other abnormalities of gait and mobility: Secondary | ICD-10-CM | POA: Diagnosis not present

## 2022-09-26 DIAGNOSIS — M79662 Pain in left lower leg: Secondary | ICD-10-CM | POA: Diagnosis not present

## 2022-09-28 DIAGNOSIS — M546 Pain in thoracic spine: Secondary | ICD-10-CM | POA: Diagnosis not present

## 2022-09-28 DIAGNOSIS — R2689 Other abnormalities of gait and mobility: Secondary | ICD-10-CM | POA: Diagnosis not present

## 2022-09-28 DIAGNOSIS — M62838 Other muscle spasm: Secondary | ICD-10-CM | POA: Diagnosis not present

## 2022-09-28 DIAGNOSIS — M79661 Pain in right lower leg: Secondary | ICD-10-CM | POA: Diagnosis not present

## 2022-09-28 DIAGNOSIS — M79662 Pain in left lower leg: Secondary | ICD-10-CM | POA: Diagnosis not present

## 2022-09-29 ENCOUNTER — Other Ambulatory Visit: Payer: Self-pay

## 2022-09-29 DIAGNOSIS — G8929 Other chronic pain: Secondary | ICD-10-CM

## 2022-09-29 DIAGNOSIS — B028 Zoster with other complications: Secondary | ICD-10-CM | POA: Insufficient documentation

## 2022-09-29 HISTORY — DX: Zoster with other complications: B02.8

## 2022-09-29 HISTORY — DX: Other chronic pain: G89.29

## 2022-10-02 NOTE — Progress Notes (Unsigned)
Cardiology Office Note:    Date:  10/03/2022   ID:  Elizabeth [name], Elizabeth [name] 15-Sep-1945, MRN [id number]  PCP:  Abner Greenspan, MD  Cardiologist:  Norman Herrlich, MD    Referring MD: Abner Greenspan, MD    ASSESSMENT:    1. PAT (paroxysmal atrial tachycardia)   2. High risk medication use   3. History of transcatheter aortic valve replacement (TAVR)   4. Hypertensive heart and renal disease with renal failure, stage 1 through stage 4 or unspecified chronic kidney disease, with heart failure (HCC)   5. Mild CAD    PLAN:    In order of problems listed above:  Stable I like to use a low-dose flecainide I like the dose she is taken we will check a level today and continue her current flecainide Stable after TAVR she has a small paravalvular leak chronic Well-controlled blood pressure targetNo edema continue current loop diuretic beta-blocker Stable CAD continue medical treatment including beta-blocker   Next appointment: 6 months   Medication Adjustments/Labs and Tests Ordered: Current medicines are reviewed at length with the patient today.  Concerns regarding medicines are outlined above.  Orders Placed This Encounter  Procedures   EKG 12-Lead   No orders of the defined types were placed in this encounter.    History of Present Illness:    Elizabeth [name] is a 77 y.o. female with a hx of severe symptomatic aortic stenosis with TAVR 05/28/2019 hypertensive heart disease with heart failure and stage II CKD mild nonobstructive CAD hyper lipidemia and palpitation with PVCs 5.5% burden on event monitor.  At that visit she was anticoagulated for DVT.  She was last seen 06/19/2022.  She was admitted to [facility] 08/05/2022 with a lower GI bleed secondary to hemorrhoids she had ectopic atrial tachycardia and was initiated on flecainide.  She was seen by electrophysiology during that admission.  On that visit she had an echocardiogram 08/04/2021 for TAVR surveillance.   There is small paravalvular leak and no evidence of valve stenosis.  Her ejection fraction remains normal at 55 to 60% moderate LVH and elevated filling pressures left atrium is severely enlarged.  A similar degree of paravalvular regurgitation was seen in the year prior.  Compliance with diet, lifestyle and medications: yes  Since discharge she has taken flecainide 50 mg twice daily as an apple smart watch and has had no rapid heart rate alerts or detection of atrial fibrillation She is not having edema shortness of breath chest pain palpitation or syncope She is planned to have banding of her hemorrhoids Past Medical History:  Diagnosis Date   Anxiety    extreme   B12 deficiency 01/03/2022   Benign hypertension with CKD (chronic kidney disease), stage II    Bilateral primary osteoarthritis of knee 12/11/2019   Breast cancer (HCC)    Pre-cancerous cell in Left breast   Chronic diastolic heart failure (HCC) 12/20/2018   Chronic pain of both knees 09/12/2017   [name] is a 77 y.o. female with low back pain, bilateral knee pain, due to multilevel multifactorial degenerative changes in the lumbar spine, and, significant multiple joint osteoarthritis.   Last Assessment & Plan:  Today I will treat with bilateral knee injections.   Ms. Bamba will return to the clinic in 6 months.  I will asses the efficacy of this treatment and make adjustments to her    CKD (chronic kidney disease), stage III (HCC)    Claustrophobia    Ductal  carcinoma in situ (DCIS) of left breast 11/08/2017   Gastroesophageal reflux disease without esophagitis 01/02/2022   GERD (gastroesophageal reflux disease)    Grade III hemorrhoids 08/05/2022   Herpes zoster with complication 09/29/2022   History of DVT of lower extremity 01/02/2022   History of kidney stones    Hypertension 09/25/2018   Hyperuricemia    Immobility 01/09/2022   Insomnia 09/25/2018   Iron deficiency anemia 01/03/2022   Lower GI bleed 08/05/2022    Lumbar radiculopathy 11/28/2017   [name] is a 77 y.o. female with low back pain, lower extremity pain, on the left, in the setting of previous laminotomy in April 2019.   Last Assessment & Plan:  Today I will treat with current OTC medications..   Elizabeth [name] will return to the clinic on an as needed basis.  I will asses the efficacy of this treatment and make adjustments to her treatment plan as necessary.  Last Assessment &   Lumbar stenosis    Osteoarthrosis    Other chronic pain 09/29/2022   Polymyalgia rheumatica (HCC) 01/02/2022   Popliteal pain 05/27/2020   Primary hypertension 09/25/2018   Rectal bleeding 08/05/2022   S/P TAVR (transcatheter aortic valve replacement) 05/28/2019   s/p TAVR with a 29 mm Medtronic Evolut Pro + via the TF approach by Dr. Excell Seltzer and Dr. Laneta Simmers    Severe aortic stenosis    Tachycardia 09/25/2018   Tarsal tunnel syndrome of right side 08/21/2019   Vitamin D deficiency     Current Medications: Current Meds  Medication Sig   acetaminophen (TYLENOL) 325 MG tablet Take 3 tablets (975 mg total) by mouth every 6 (six) hours as needed for mild pain.   flecainide (TAMBOCOR) 100 MG tablet Take 100 mg by mouth every 12 (twelve) hours.   furosemide (LASIX) 20 MG tablet Take 1 tablet (20 mg total) by mouth every other day. TAKE 1 TABLET(20 MG) BY MOUTH DAILY   hydrocortisone (ANUSOL-HC) 25 MG suppository Place 1 suppository (25 mg total) rectally 2 (two) times daily.   lidocaine (LIDODERM) 5 % Place 1 patch onto the skin daily. Remove & Discard patch within 12 hours or as directed by MD   metoprolol succinate (TOPROL-XL) 25 MG 24 hr tablet TAKE 1/2 TABLET(12.5 MG) BY MOUTH DAILY   oxyCODONE (OXY IR/ROXICODONE) 5 MG immediate release tablet Take 1 tablet (5 mg total) by mouth every 6 (six) hours as needed for moderate pain or severe pain (pain not relieved by tylenol, robaxin, lidoderm).   pantoprazole (PROTONIX) 40 MG tablet TAKE 1 TABLET(40 MG) BY MOUTH  DAILY   predniSONE (DELTASONE) 5 MG tablet Take by mouth daily with breakfast.   tamoxifen (NOLVADEX) 20 MG tablet Take 1 tablet (20 mg total) by mouth daily.   [DISCONTINUED] predniSONE (DELTASONE) 1 MG tablet Take 4 mg by mouth daily.      EKGs/Labs/Other Studies Reviewed:    The following studies were reviewed today:  Cardiac Studies & Procedures   CARDIAC CATHETERIZATION  CARDIAC CATHETERIZATION 03/11/2019  Narrative 1. Severe aortic stenosis with bulky calcification of the aortic valve leaflets, restricted mobility, and a mean transvalvular gradient of 43 mmHg. 2. Normal right heart pressures 3. Nonobstructive CAD, left-dominant, appropriate for medical therapy of coronary disease  Recommend: multidisciplinary heart team evaluation for aortic valve replacement  Findings Coronary Findings Diagnostic  Dominance: Left  Left Anterior Descending Prox LAD to Mid LAD lesion is 40% stenosed. There is diffuse nonobstructive mid LAD stenosis  Left Circumflex  The vessel exhibits minimal luminal irregularities. Large, dominant circumflex with minimal irregularity, no significant stenosis.  Right Coronary Artery Not selectively injected.  Appears to be diminutive.  Intervention  No interventions have been documented.     ECHOCARDIOGRAM  ECHOCARDIOGRAM COMPLETE 08/07/2022  Narrative ECHOCARDIOGRAM REPORT    Patient Name:   DNIYA NEUHAUS Daniely Date of Exam: 08/07/2022 Medical Rec #:  [id number]            Height:       65.5 in Accession #:    [id number]           Weight:       215.0 lb Date of Birth:  02-23-1946            BSA:          2.052 m Patient Age:    76 years             BP:           132/78 mmHg Patient Gender: F                    HR:           99 bpm. Exam Location:  Inpatient  Procedure: 2D Echo  Indications:    Aortic valve dx  History:        Patient has prior history of Echocardiogram examinations. Aortic Valve Disease.  Sonographer:    Lucy Antigua Referring Phys: [id number] JULIE ANNE WILLIAMS  IMPRESSIONS   1. Left ventricular ejection fraction, by estimation, is 55 to 60%. The left ventricle has normal function. The left ventricle has no regional wall motion abnormalities. Left ventricular diastolic parameters are indeterminate. 2. Right ventricular systolic function is normal. The right ventricular size is normal. 3. Left atrial size was mildly dilated. 4. The mitral valve is degenerative. No evidence of mitral valve regurgitation. No evidence of mitral stenosis. Moderate mitral annular calcification. 5. Tricuspid valve regurgitation is mild to moderate. 6. Post TAVR with 29 mm Medtronic Evolut valve stable low gradients mild PVL seen at 3:00 on PSSX views . The aortic valve has been repaired/replaced. Aortic valve regurgitation is not visualized. No aortic stenosis is present. 7. The inferior vena cava is normal in size with greater than 50% respiratory variability, suggesting right atrial pressure of 3 mmHg.  FINDINGS Left Ventricle: Left ventricular ejection fraction, by estimation, is 55 to 60%. The left ventricle has normal function. The left ventricle has no regional wall motion abnormalities. The left ventricular internal cavity size was normal in size. There is no left ventricular hypertrophy. Left ventricular diastolic parameters are indeterminate.  Right Ventricle: The right ventricular size is normal. No increase in right ventricular wall thickness. Right ventricular systolic function is normal.  Left Atrium: Left atrial size was mildly dilated.  Right Atrium: Right atrial size was normal in size.  Pericardium: There is no evidence of pericardial effusion.  Mitral Valve: The mitral valve is degenerative in appearance. There is mild thickening of the mitral valve leaflet(s). There is mild calcification of the mitral valve leaflet(s). Moderate mitral annular calcification. No evidence of mitral valve regurgitation. No  evidence of mitral valve stenosis.  Tricuspid Valve: The tricuspid valve is normal in structure. Tricuspid valve regurgitation is mild to moderate. No evidence of tricuspid stenosis.  Aortic Valve: Post TAVR with 29 mm Medtronic Evolut valve stable low gradients mild PVL seen at 3:00 on PSSX views. The aortic valve has been repaired/replaced. Aortic valve regurgitation  is not visualized. No aortic stenosis is present. Aortic valve mean gradient measures 10.5 mmHg. Aortic valve peak gradient measures 16.9 mmHg. Aortic valve area, by VTI measures 2.20 cm.  Pulmonic Valve: The pulmonic valve was normal in structure. Pulmonic valve regurgitation is not visualized. No evidence of pulmonic stenosis.  Aorta: The aortic root is normal in size and structure.  Venous: The inferior vena cava is normal in size with greater than 50% respiratory variability, suggesting right atrial pressure of 3 mmHg.  IAS/Shunts: No atrial level shunt detected by color flow Doppler.   LEFT VENTRICLE PLAX 2D LVIDd:         5.00 cm   Diastology LVIDs:         4.05 cm   LV e' medial:    7.15 cm/s LV PW:         1.15 cm   LV E/e' medial:  15.2 LV IVS:        1.00 cm   LV e' lateral:   8.24 cm/s LVOT diam:     2.40 cm   LV E/e' lateral: 13.2 LV SV:         107 LV SV Index:   52 LVOT Area:     4.52 cm   RIGHT VENTRICLE RV S prime:     9.79 cm/s TAPSE (M-mode): 2.3 cm  LEFT ATRIUM             Index        RIGHT ATRIUM           Index LA Vol (A2C):   57.0 ml 27.78 ml/m  RA Area:     12.80 cm LA Vol (A4C):   77.8 ml 37.92 ml/m  RA Volume:   26.00 ml  12.67 ml/m LA Biplane Vol: 67.5 ml 32.90 ml/m AORTIC VALVE AV Area (Vmax):    2.29 cm AV Area (Vmean):   2.19 cm AV Area (VTI):     2.20 cm AV Vmax:           205.50 cm/s AV Vmean:          153.000 cm/s AV VTI:            0.486 m AV Peak Grad:      16.9 mmHg AV Mean Grad:      10.5 mmHg LVOT Vmax:         104.00 cm/s LVOT Vmean:        74.100 cm/s LVOT  VTI:          0.236 m LVOT/AV VTI ratio: 0.49  MITRAL VALVE MV Area (PHT): 4.19 cm     SHUNTS MV Decel Time: 181 msec     Systemic VTI:  0.24 m MV E velocity: 109.00 cm/s  Systemic Diam: 2.40 cm MV A velocity: 73.10 cm/s MV E/A ratio:  1.49  Charlton Haws MD Electronically signed by Charlton Haws MD Signature Date/Time: 08/07/2022/2:40:42 PM    Final    MONITORS  LONG TERM MONITOR (3-14 DAYS) 05/19/2020  Narrative Patch Wear Time:  6 days and 17 hours (2022-02-15T11:45:51-0500 to 2022-02-22T05:42:06-498)  Patient had a min HR of 42 bpm, max HR of 171 bpm, and avg HR of 67 bpm. Predominant underlying rhythm was Sinus Rhythm. 19 Supraventricular Tachycardia runs occurred, the run with the fastest interval lasting 6 beats with a max rate of 171 bpm, the longest lasting 16 beats with an avg rate of 149 bpm. Idioventricular Rhythm was present. Isolated SVEs were occasional (3.5%, 22694),  SVE Couplets were rare (<1.0%, 475), and SVE Triplets were rare (<1.0%, 28). Isolated VEs were frequent (5.5%, [id number]), VE Couplets were rare (<1.0%, 141), and VE Triplets were rare (<1.0%, 3). Ventricular Bigeminy and Trigeminy were present.  An event monitor was performed for 7 days to assess palpitation. Cardiac rhythm was sinus with average, minimum maximal heart rates of 67, 42 and 121 bpm. There were no pauses of 3 seconds or greater no episodes of second or third-degree AV node block or sinus node exit block. Ventricular ectopy was frequent with PVCs 141 couplets and 3 triplets.  There was one 6 beat run of PVCs accelerated idioventricular rhythm at a rate of 95 to 100 bpm. Supraventricular ectopy was occasional.  There were no episodes of atrial fibrillation or flutter.  There were 19 brief runs of APCs the longest 16 complexes at a rate of 150 bpm which appeared to be an organized SVT with a long RP interval and inverted P wave.  Conclusion occasional PVCs with couplets triplets and burden of 5.5%  and occasional APCs with brief runs of APCs.   CT SCANS  CT CORONARY MORPH W/CTA COR W/SCORE 05/14/2019  Addendum 05/14/2019  5:21 PM ADDENDUM REPORT: 05/14/2019 17:18  CLINICAL DATA:  73 -year-old female with severe aortic stenosis being evaluated for a TAVR procedure.  EXAM: Cardiac TAVR CT  TECHNIQUE: The patient was scanned on a [employer]. A 120 kV retrospective scan was triggered in the descending thoracic aorta at 111 HU's. Gantry rotation speed was 250 msecs and collimation was .6 mm. No beta blockade or nitro were given. The 3D data set was reconstructed in 5% intervals of the R-R cycle. Systolic and diastolic phases were analyzed on a dedicated work station using MPR, MIP and VRT modes. The patient received 80 cc of contrast.  FINDINGS: Aortic Root:  Aortic valve: Tricuspid  Aortic valve calcium score: 2715  Aortic annulus:  Diameter: 25mm x 22mm  Perimeter: 74mm  Area: 423 mm^2  Calcifications: No calcifications  Coronary height: Min Left -19mm , Max Left -25mm ; Min Right - 13mm  Sinotubular height: Left cusp -26mm ; Right cusp - 18mm; Noncoronary cusp - 20mm  LVOT (as measured 3 mm below the annulus):  Diameter: 26mm x 21mm  Area: 342mm^2  Calcifications: Moderate LVOT calcification located 5mm inferior to annulus, beneath noncoronary cusp  Aortic sinus width: Left cusp - 35mm; Right cusp - 33mm; Noncoronary cusp -34mm;  Sinotubular junction width: 34mm x 32mm  Optimum Fluoroscopic Angle for Delivery: LAO 3 CRA 21  Cardiac:  Right atrium: Normal size  Right ventricle: Mildly dilated  Pulmonary arteries: Mildly dilated, measures 31mm in main PA  Pulmonary veins: Normal configuration  Left atrium: Moderately dilated  Left ventricle: Moderately dilated  Pericardium: Normal thickness  Coronary arteries: Calcium score 494 (88th percentile)  IMPRESSION: 1.  Tricuspid aortic valve, severely calcified (calcium score  2715)  2. Aortic annulus measures 25mm x 22mm in diameter with area 423 mm^2 and perimeter 74mm. No annular calcifications. Annular measurements suitable for delivery of 23mm Edwards-Sapien 3 valve  3. Moderate LVOT calcification located 5mm inferior to annulus, beneath noncoronary cusp  4. Sufficient coronary to annulus distance, measures 19mm from left main to annulus, and 13mm from RCA to annulus  5. Optimum Fluoroscopic Angle for Delivery (centered on right coronary cusp): LAO 3 CRA 21  6.  Coronary calcium score 494 (88th percentile)   Electronically Signed By: Epifanio Lesches MD On: 05/14/2019 17:18  Narrative EXAM: OVER-READ INTERPRETATION  CT CHEST  The following report is an over-read performed by radiologist Dr. Trudie Reed of [facility] on 05/14/2019. This over-read does not include interpretation of cardiac or coronary anatomy or pathology. The coronary calcium score/coronary CTA interpretation by the cardiologist is attached.  COMPARISON:  None.  FINDINGS: Extracardiac findings will be described separately under dictation for contemporaneously obtained CTA chest, abdomen and pelvis.  IMPRESSION: Please see separate dictation for contemporaneously obtained CTA chest, abdomen and pelvis 05/14/2019 for full description of relevant extracardiac findings.  Electronically Signed: By: Trudie Reed M.D. On: 05/14/2019 10:56        EKG Interpretation Date/Time:  Monday October 03 2022 14:28:32 EDT Ventricular Rate:  67 PR Interval:  172 QRS Duration:  92 QT Interval:  420 QTC Calculation: 443 R Axis:   94  Text Interpretation: Normal sinus rhythm Rightward axis Poor R wave progression Abnormal ECG When compared with ECG of 05-Aug-2022 07:44, PREVIOUS ECG IS PRESENT Confirmed by Norman Herrlich ([id number]) on 10/03/2022 2:42:02 PM EKG Interpretation Date/Time:  Monday October 03 2022 14:28:32 EDT Ventricular Rate:  67 PR Interval:  172 QRS  Duration:  92 QT Interval:  420 QTC Calculation: 443 R Axis:   94  Text Interpretation: Normal sinus rhythm Rightward axis Poor R wave progression Abnormal ECG When compared with ECG of 05-Aug-2022 07:44, PREVIOUS ECG IS PRESENT Confirmed by Norman Herrlich ([id number]) on 10/03/2022 2:42:02 PM   Recent Labs: 08/05/2022: ALT 13 08/06/2022: BUN 18; Creatinine, Ser 1.07; Magnesium 2.2; Potassium 4.3; Sodium 140 08/08/2022: Hemoglobin 10.9; Platelets 127  Recent Lipid Panel    Component Value Date/Time   CHOL 198 04/28/2020 1149   TRIG 162 (H) 04/28/2020 1149   HDL 55 04/28/2020 1149   CHOLHDL 3.6 04/28/2020 1149   LDLCALC 115 (H) 04/28/2020 1149    Physical Exam:    VS:  BP 130/77   Pulse 67   Ht 5' 5.6" (1.666 m)   Wt 229 lb 3.2 oz (104 kg)   SpO2 92%   BMI 37.45 kg/m     Wt Readings from Last 3 Encounters:  10/03/22 229 lb 3.2 oz (104 kg)  08/05/22 215 lb (97.5 kg)  01/08/22 235 lb 14.3 oz (107 kg)     GEN:  Well nourished, well developed in no acute distress HEENT: Normal NECK: No JVD; No carotid bruits LYMPHATICS: No lymphadenopathy CARDIAC: RRR, no murmurs, rubs, gallops RESPIRATORY:  Clear to auscultation without rales, wheezing or rhonchi  ABDOMEN: Soft, non-tender, non-distended MUSCULOSKELETAL:  No edema; No deformity  SKIN: Warm and dry NEUROLOGIC:  Alert and oriented x 3 PSYCHIATRIC:  Normal affect    Signed, Norman Herrlich, MD  10/03/2022 2:52 PM    [facility]

## 2022-10-03 ENCOUNTER — Ambulatory Visit: Payer: Medicare Other | Attending: Cardiology | Admitting: Cardiology

## 2022-10-03 ENCOUNTER — Encounter: Payer: Self-pay | Admitting: Cardiology

## 2022-10-03 VITALS — BP 130/77 | HR 67 | Ht 65.6 in | Wt 229.2 lb

## 2022-10-03 DIAGNOSIS — Z79899 Other long term (current) drug therapy: Secondary | ICD-10-CM | POA: Diagnosis not present

## 2022-10-03 DIAGNOSIS — I2510 Atherosclerotic heart disease of native coronary artery without angina pectoris: Secondary | ICD-10-CM

## 2022-10-03 DIAGNOSIS — I130 Hypertensive heart and chronic kidney disease with heart failure and stage 1 through stage 4 chronic kidney disease, or unspecified chronic kidney disease: Secondary | ICD-10-CM

## 2022-10-03 DIAGNOSIS — I4719 Other supraventricular tachycardia: Secondary | ICD-10-CM | POA: Diagnosis not present

## 2022-10-03 DIAGNOSIS — Z952 Presence of prosthetic heart valve: Secondary | ICD-10-CM

## 2022-10-03 MED ORDER — FLECAINIDE ACETATE 50 MG PO TABS: 50.0000 mg | ORAL_TABLET | Freq: Every day | ORAL | 3 refills | Status: DC

## 2022-10-03 NOTE — Patient Instructions (Signed)
Medication Instructions:  Your physician has recommended you make the following change in your medication:   START: Tambacor 50 mg daily  *If you need a refill on your cardiac medications before your next appointment, please call your pharmacy*   Lab Work: Your physician recommends that you return for lab work in:   Labs today: Flecanide level  If you have labs (blood work) drawn today and your tests are completely normal, you will receive your results only by: MyChart Message (if you have MyChart) OR A paper copy in the mail If you have any lab test that is abnormal or we need to change your treatment, we will call you to review the results.   Testing/Procedures: None   Follow-Up: At [facility], you and your health needs are our priority.  As part of our continuing mission to provide you with exceptional heart care, we have created designated Provider Care Teams.  These Care Teams include your primary Cardiologist (physician) and Advanced Practice Providers (APPs -  Physician Assistants and Nurse Practitioners) who all work together to provide you with the care you need, when you need it.  We recommend signing up for the patient portal called "MyChart".  Sign up information is provided on this After Visit Summary.  MyChart is used to connect with patients for Virtual Visits (Telemedicine).  Patients are able to view lab/test results, encounter notes, upcoming appointments, etc.  Non-urgent messages can be sent to your provider as well.   To learn more about what you can do with MyChart, go to [email or web address].    Your next appointment:   6 month(s)  Provider:   Norman Herrlich, MD    Other Instructions None

## 2022-10-03 NOTE — Addendum Note (Signed)
Addended by: Roxanne Mins I on: 10/03/2022 03:03 PM   Modules accepted: Orders

## 2022-10-05 DIAGNOSIS — R2689 Other abnormalities of gait and mobility: Secondary | ICD-10-CM | POA: Diagnosis not present

## 2022-10-05 DIAGNOSIS — M62838 Other muscle spasm: Secondary | ICD-10-CM | POA: Diagnosis not present

## 2022-10-05 DIAGNOSIS — M79662 Pain in left lower leg: Secondary | ICD-10-CM | POA: Diagnosis not present

## 2022-10-05 DIAGNOSIS — M546 Pain in thoracic spine: Secondary | ICD-10-CM | POA: Diagnosis not present

## 2022-10-05 DIAGNOSIS — M79661 Pain in right lower leg: Secondary | ICD-10-CM | POA: Diagnosis not present

## 2022-10-13 ENCOUNTER — Encounter: Payer: Medicare Other | Admitting: Gastroenterology

## 2022-10-13 DIAGNOSIS — E782 Mixed hyperlipidemia: Secondary | ICD-10-CM | POA: Diagnosis not present

## 2022-10-13 DIAGNOSIS — I129 Hypertensive chronic kidney disease with stage 1 through stage 4 chronic kidney disease, or unspecified chronic kidney disease: Secondary | ICD-10-CM | POA: Diagnosis not present

## 2022-10-13 DIAGNOSIS — N182 Chronic kidney disease, stage 2 (mild): Secondary | ICD-10-CM | POA: Diagnosis not present

## 2022-10-18 ENCOUNTER — Other Ambulatory Visit: Payer: Self-pay | Admitting: Cardiology

## 2022-10-26 DIAGNOSIS — M170 Bilateral primary osteoarthritis of knee: Secondary | ICD-10-CM | POA: Diagnosis not present

## 2022-10-27 DIAGNOSIS — E782 Mixed hyperlipidemia: Secondary | ICD-10-CM | POA: Diagnosis not present

## 2022-10-27 DIAGNOSIS — I129 Hypertensive chronic kidney disease with stage 1 through stage 4 chronic kidney disease, or unspecified chronic kidney disease: Secondary | ICD-10-CM | POA: Diagnosis not present

## 2022-10-27 DIAGNOSIS — B028 Zoster with other complications: Secondary | ICD-10-CM | POA: Diagnosis not present

## 2022-11-01 DIAGNOSIS — H524 Presbyopia: Secondary | ICD-10-CM | POA: Diagnosis not present

## 2022-11-01 DIAGNOSIS — H52223 Regular astigmatism, bilateral: Secondary | ICD-10-CM | POA: Diagnosis not present

## 2022-11-01 DIAGNOSIS — H35363 Drusen (degenerative) of macula, bilateral: Secondary | ICD-10-CM | POA: Diagnosis not present

## 2022-11-02 ENCOUNTER — Inpatient Hospital Stay: Payer: Medicare Other | Attending: Hematology and Oncology | Admitting: Hematology and Oncology

## 2022-11-02 ENCOUNTER — Encounter: Payer: Medicare Other | Admitting: Gastroenterology

## 2022-11-02 NOTE — Assessment & Plan Note (Deleted)
10/23/2017: Screening detected 5 mm indeterminate mass left breast upper outer quadrant biopsy revealed intermediate grade DCIS ER 100%, PR 100%, Tis N0 stage 0   Treatment plan: Active surveillance with tamoxifen and mammograms every 6 months.  Patient decided not to undergo surgery. Current treatment: Tamoxifen 20 mg daily started 10/31/2017 We might stop tamoxifen after 2022/11/12.  It is unclear whether she will need to stay on it for longer than that.   Tamoxifen toxicities: Denies any hot flashes or myalgias.   Breast cancer surveillance: 1.  Left breast mammogram 06/07/2022: Benign breast density category B, next mammogram to be done in September 2024.   2.  Breast exam 11/02/2022: Benign   Her husband passed away in Nov 11, 2020. Return to clinic in 1 year for follow-up

## 2022-11-03 DIAGNOSIS — N182 Chronic kidney disease, stage 2 (mild): Secondary | ICD-10-CM | POA: Diagnosis not present

## 2022-11-03 DIAGNOSIS — E782 Mixed hyperlipidemia: Secondary | ICD-10-CM | POA: Diagnosis not present

## 2022-11-03 DIAGNOSIS — I129 Hypertensive chronic kidney disease with stage 1 through stage 4 chronic kidney disease, or unspecified chronic kidney disease: Secondary | ICD-10-CM | POA: Diagnosis not present

## 2022-11-03 DIAGNOSIS — I5030 Unspecified diastolic (congestive) heart failure: Secondary | ICD-10-CM | POA: Diagnosis not present

## 2022-11-11 ENCOUNTER — Other Ambulatory Visit: Payer: Self-pay | Admitting: Cardiology

## 2022-11-11 NOTE — Telephone Encounter (Signed)
Rx refill sent to pharmacy. 

## 2022-11-22 DIAGNOSIS — I4719 Other supraventricular tachycardia: Secondary | ICD-10-CM | POA: Insufficient documentation

## 2022-11-22 HISTORY — DX: Other supraventricular tachycardia: I47.19

## 2022-11-23 ENCOUNTER — Ambulatory Visit: Payer: Medicare Other | Attending: Internal Medicine | Admitting: Internal Medicine

## 2022-11-23 DIAGNOSIS — I4719 Other supraventricular tachycardia: Secondary | ICD-10-CM

## 2022-11-24 ENCOUNTER — Encounter: Payer: Self-pay | Admitting: Internal Medicine

## 2022-12-12 DIAGNOSIS — G580 Intercostal neuropathy: Secondary | ICD-10-CM | POA: Diagnosis not present

## 2022-12-19 ENCOUNTER — Telehealth: Payer: Self-pay | Admitting: Cardiology

## 2022-12-19 ENCOUNTER — Other Ambulatory Visit: Payer: Self-pay

## 2022-12-19 MED ORDER — METOPROLOL SUCCINATE ER 25 MG PO TB24: 12.5000 mg | ORAL_TABLET | Freq: Every day | ORAL | 3 refills | Status: DC

## 2022-12-19 NOTE — Telephone Encounter (Signed)
Called patient and informed her that her Metoprolol medication had been refilled. Instructed patient to take the Metoprolol as prescribed instead of taking a whole tablet. Patient verbalized understanding and had no further questions at this time.

## 2022-12-19 NOTE — Telephone Encounter (Signed)
Pt c/o medication issue:  1. Name of Medication:   metoprolol succinate (TOPROL-XL) 25 MG 24 hr tablet    2. How are you currently taking this medication (dosage and times per day)? Take 0.5 tablets (12.5 mg total) by mouth daily.   3. Are you having a reaction (difficulty breathing--STAT)? No  4. What is your medication issue? Patient is calling because she was taking half a tablet and is now taking a whole tablet. Patient was wanting a refill, but it is too soon. Please advise.

## 2022-12-25 DIAGNOSIS — M7989 Other specified soft tissue disorders: Secondary | ICD-10-CM | POA: Diagnosis not present

## 2022-12-25 DIAGNOSIS — M2012 Hallux valgus (acquired), left foot: Secondary | ICD-10-CM | POA: Diagnosis not present

## 2022-12-25 DIAGNOSIS — X500XXA Overexertion from strenuous movement or load, initial encounter: Secondary | ICD-10-CM | POA: Diagnosis not present

## 2022-12-25 DIAGNOSIS — M7732 Calcaneal spur, left foot: Secondary | ICD-10-CM | POA: Diagnosis not present

## 2022-12-25 DIAGNOSIS — R600 Localized edema: Secondary | ICD-10-CM | POA: Diagnosis not present

## 2022-12-25 DIAGNOSIS — S9002XA Contusion of left ankle, initial encounter: Secondary | ICD-10-CM | POA: Diagnosis not present

## 2022-12-25 DIAGNOSIS — M7122 Synovial cyst of popliteal space [Baker], left knee: Secondary | ICD-10-CM | POA: Diagnosis not present

## 2022-12-25 DIAGNOSIS — M19072 Primary osteoarthritis, left ankle and foot: Secondary | ICD-10-CM | POA: Diagnosis not present

## 2022-12-28 ENCOUNTER — Encounter: Payer: Medicare Other | Admitting: Gastroenterology

## 2023-01-02 DIAGNOSIS — Z79891 Long term (current) use of opiate analgesic: Secondary | ICD-10-CM | POA: Diagnosis not present

## 2023-01-02 DIAGNOSIS — M47814 Spondylosis without myelopathy or radiculopathy, thoracic region: Secondary | ICD-10-CM | POA: Diagnosis not present

## 2023-01-02 DIAGNOSIS — M170 Bilateral primary osteoarthritis of knee: Secondary | ICD-10-CM | POA: Diagnosis not present

## 2023-01-05 DIAGNOSIS — Z0000 Encounter for general adult medical examination without abnormal findings: Secondary | ICD-10-CM | POA: Diagnosis not present

## 2023-01-05 DIAGNOSIS — Z136 Encounter for screening for cardiovascular disorders: Secondary | ICD-10-CM | POA: Diagnosis not present

## 2023-01-05 DIAGNOSIS — Z139 Encounter for screening, unspecified: Secondary | ICD-10-CM | POA: Diagnosis not present

## 2023-01-05 DIAGNOSIS — M7122 Synovial cyst of popliteal space [Baker], left knee: Secondary | ICD-10-CM | POA: Diagnosis not present

## 2023-01-09 DIAGNOSIS — M170 Bilateral primary osteoarthritis of knee: Secondary | ICD-10-CM | POA: Diagnosis not present

## 2023-01-10 ENCOUNTER — Encounter: Payer: Self-pay | Admitting: Gastroenterology

## 2023-01-10 ENCOUNTER — Ambulatory Visit: Payer: Medicare Other | Admitting: Gastroenterology

## 2023-01-10 VITALS — BP 130/80 | HR 68 | Ht 65.0 in | Wt 238.0 lb

## 2023-01-10 DIAGNOSIS — K649 Unspecified hemorrhoids: Secondary | ICD-10-CM | POA: Diagnosis not present

## 2023-01-10 DIAGNOSIS — K625 Hemorrhage of anus and rectum: Secondary | ICD-10-CM | POA: Diagnosis not present

## 2023-01-10 NOTE — Progress Notes (Signed)
HPI :  77 year old female here for a follow-up visit from hospitalization in May for rectal bleeding.  Recall she has a history of CHF, breast cancer, CKD, GERD, aortic stenosis status post TAVR.  She was hospitalized in May for hematochezia, few episodes of bright red blood per rectum.  Hemoglobin remained normal at the time on admission.  On exam she had grade 3 hemorrhoids which were thought to be the source of her symptoms.  Recall she had an EGD and colonoscopy in October 2023 with grade 3 hemorrhoids noted at the time and no other concerning pathology.  She was discharged after an overnight stay and we had discussed follow-up hemorrhoid banding for this if it persisted or recurred.  She states she has been feeling well since hospitalization in regards to this issue.  She has a bowel movement every day when she takes her stool softener and denies any problems with her bowels.  She is not having any further constipation.  She reports having labs with Dr. Yetta Flock her primary care and she is not having any anemia or problems in that regard.  She denies any problems with hemorrhoidal related bleeding since the hospitalization.  She denies any symptoms the hemorrhoids otherwise that bother her in regards to prolapse itching inflammation etc.  She was previously given Anusol at her hospitalization but states she never used it.  She is rather anxious about having an intervention done on the hemorrhoids currently.   GI history: 01/04/2022 Colonoscopy with diverticulosis of moderate severity in the sigmoid colon and 3 medium, protruding hemorrhoids; EGD with stomach and duodenum normal, stricture in the upper third of the esophagus and lower third of the esophagus which was dilated     Past Medical History:  Diagnosis Date   Anxiety    extreme   B12 deficiency 01/03/2022   Benign hypertension with CKD (chronic kidney disease), stage II    Bilateral primary osteoarthritis of knee 12/11/2019    Breast cancer (HCC)    Pre-cancerous cell in Left breast   Chronic diastolic heart failure (HCC) 12/20/2018   Chronic pain of both knees 09/12/2017   Elizabeth [name] is a 77 y.o. female with low back pain, bilateral knee pain, due to multilevel multifactorial degenerative changes in the lumbar spine, and, significant multiple joint osteoarthritis.   Last Assessment & Plan:  Today I will treat with bilateral knee injections.   Elizabeth [name] will return to the clinic in 6 months.  I will asses the efficacy of this treatment and make adjustments to her    CKD (chronic kidney disease), stage III (HCC)    Claustrophobia    Ductal carcinoma in situ (DCIS) of left breast 11/08/2017   Gastroesophageal reflux disease without esophagitis 01/02/2022   GERD (gastroesophageal reflux disease)    Grade III hemorrhoids 08/05/2022   Herpes zoster with complication 09/29/2022   History of DVT of lower extremity 01/02/2022   History of kidney stones    Hypertension 09/25/2018   Hyperuricemia    Immobility 01/09/2022   Insomnia 09/25/2018   Iron deficiency anemia 01/03/2022   Lower GI bleed 08/05/2022   Lumbar radiculopathy 11/28/2017   Elizabeth [name] is a 77 y.o. female with low back pain, lower extremity pain, on the left, in the setting of previous laminotomy in April 2019.   Last Assessment & Plan:  Today I will treat with current OTC medications..   Elizabeth [name] will return to the clinic on an as needed basis.  I will asses the efficacy of this treatment and make adjustments to her treatment plan as necessary.  Last Assessment &   Lumbar stenosis    Osteoarthrosis    Other chronic pain 09/29/2022   Polymyalgia rheumatica (HCC) 01/02/2022   Popliteal pain 05/27/2020   Primary hypertension 09/25/2018   Rectal bleeding 08/05/2022   S/P TAVR (transcatheter aortic valve replacement) 05/28/2019   s/p TAVR with a 29 mm Medtronic Evolut Pro + via the TF approach by Dr. Excell Seltzer and Dr. Laneta Simmers    Severe aortic stenosis     Tachycardia 09/25/2018   Tarsal tunnel syndrome of right side 08/21/2019   Vitamin D deficiency      Past Surgical History:  Procedure Laterality Date   ABDOMINAL HYSTERECTOMY     ABDOMINAL HYSTERECTOMY     APPENDECTOMY     BACK SURGERY     BREAST BIOPSY Left 10/2017   CARDIAC CATHETERIZATION  03/11/2019   CHOLECYSTECTOMY     GALLBLADDER SURGERY     INTRAOPERATIVE TRANSTHORACIC ECHOCARDIOGRAM  05/28/2019   Procedure: Intraoperative Transthoracic Echocardiogram;  Surgeon: Tonny Bollman, MD;  Location: [facility] OR;  Service: Open Heart Surgery;;   IR RADIOLOGY PERIPHERAL GUIDED IV START  05/14/2019   IR US GUIDE VASC ACCESS RIGHT  05/14/2019   LUMBAR LAMINECTOMY/DECOMPRESSION MICRODISCECTOMY N/A 06/12/2017   Procedure: LAMINECTOMY AND FORAMINOTOMY LUMBAR THREE- LUMBAR FOUR, LUMBAR FOUR- LUMBAR FIVE;  Surgeon: Tressie Stalker, MD;  Location: [facility] OR;  Service: Neurosurgery;  Laterality: N/A;   RIGHT/LEFT HEART CATH AND CORONARY ANGIOGRAPHY N/A 03/11/2019   Procedure: RIGHT/LEFT HEART CATH AND CORONARY ANGIOGRAPHY;  Surgeon: Tonny Bollman, MD;  Location: [facility] INVASIVE CV LAB;  Service: Cardiovascular;  Laterality: N/A;   TONSILLECTOMY     TRANSCATHETER AORTIC VALVE REPLACEMENT, TRANSFEMORAL N/A 05/28/2019   Procedure: TRANSCATHETER AORTIC VALVE REPLACEMENT, TRANSFEMORAL;  Surgeon: Tonny Bollman, MD;  Location: [facility] OR;  Service: Open Heart Surgery;  Laterality: N/A;   Family History  Problem Relation Age of Onset   Alzheimer's disease Mother    Aneurysm Father    Stomach cancer Brother    Social History   Tobacco Use   Smoking status: Never    Passive exposure: Never   Smokeless tobacco: Never  Vaping Use   Vaping status: Never Used  Substance Use Topics   Alcohol use: No   Drug use: No   Current Outpatient Medications  Medication Sig Dispense Refill   acetaminophen (TYLENOL) 325 MG tablet Take 3 tablets (975 mg total) by mouth every 6 (six) hours as needed for mild pain.      flecainide (TAMBOCOR) 50 MG tablet Take 1 tablet (50 mg total) by mouth daily. 90 tablet 3   furosemide (LASIX) 20 MG tablet TAKE 1 TABLET BY MOUTH DAILY 90 tablet 3   lidocaine (LIDODERM) 5 % Place 1 patch onto the skin daily. Remove & Discard patch within 12 hours or as directed by MD 14 patch 0   metoprolol succinate (TOPROL-XL) 25 MG 24 hr tablet Take 0.5 tablets (12.5 mg total) by mouth daily. 45 tablet 3   oxyCODONE (OXY IR/ROXICODONE) 5 MG immediate release tablet Take 1 tablet (5 mg total) by mouth every 6 (six) hours as needed for moderate pain or severe pain (pain not relieved by tylenol, robaxin, lidoderm). 15 tablet 0   pantoprazole (PROTONIX) 40 MG tablet TAKE 1 TABLET(40 MG) BY MOUTH DAILY 90 tablet 2   predniSONE (DELTASONE) 5 MG tablet Take by mouth daily with breakfast.  tamoxifen (NOLVADEX) 20 MG tablet Take 1 tablet (20 mg total) by mouth daily. 90 tablet 3   hydrocortisone (ANUSOL-HC) 25 MG suppository Place 1 suppository (25 mg total) rectally 2 (two) times daily. (Patient not taking: Reported on 01/10/2023) 12 suppository 0   No current facility-administered medications for this visit.   Allergies  Allergen Reactions   Penicillins Hives and Other (See Comments)    Has patient had a PCN reaction causing immediate rash, facial/tongue/throat swelling, SOB or lightheadedness with hypotension:    #  #  YES  #  #  Has patient had a PCN reaction causing severe rash involving mucus membranes or skin necrosis:    #  #  YES  #  #  Has patient had a PCN reaction that required hospitalization: No Has patient had a PCN reaction occurring within the last 10 years: No   Has patient had a PCN reaction causing immediate rash, facial/tongue/throat swelling, SOB or lightheadedness with hypotension: Yes Has patient had a PCN reaction causing severe rash involving mucus membranes or skin necrosis: Yes Has patient had a PCN reaction that required hospitalization: No Has patient had a  PCN reaction occurring within the last 10 years: No If all of the above answers are "NO", then may proceed with Cephalosporin use. Has patient had a PCN reaction causing immediate rash, facial/tongue/throat swelling, SOB or lightheadedness with hypotension:    #  #  YES  #  #  Has patient had a PCN reaction causing severe rash involving mucus membranes or skin necrosis:    #  #  YES  #  #  Has patient had a PCN reaction that required hospitalization: No Has patient had a PCN reaction occurring within the last 10 years: No   Atorvastatin Itching    Redness and itching all over body    Codeine Nausea And Vomiting    Patient and daughter in law at bedside stated that patient doesn't have any problems with taking this.     Review of Systems: All systems reviewed and negative except where noted in HPI.      Physical Exam: BP 130/80   Pulse 68   Ht 5\' 5"  (1.651 m)   Wt 238 lb (108 kg)   BMI 39.61 kg/m  Constitutional: Pleasant,well-developed, female in no acute distress. Neurological: Alert and oriented to person place and time. Psychiatric: Normal mood and affect. Behavior is normal.   ASSESSMENT: 77 y.o. female here for assessment of the following  1. Hemorrhoids, unspecified hemorrhoid type   2. Rectal bleeding    Previously had rectal bleeding due to grade 3 hemorrhoids which led to brief hospitalization in May.  No significant anemia from this.  Recommended stool softeners, Anusol and consideration for banding.  She has not had any further rectal bleeding she endorses since the hospitalization and states the hemorrhoids have not bothered her in any way since she has been moving her bowels without straining etc.  She never use the Anusol.  In this light I do not think she needs hemorrhoid banding today.  She is extremely anxious about doing that to begin with and if she has more frequent or persistent symptoms moving forward we can consider it, however she has been doing well  over the last 6 months and can hold off on banding for now.  She will contact me in the interim for reassessment if symptoms recur.  PLAN: - continue stool softener - f/u PRN for recurrent bleeding -  declines banding for now   Harlin Rain, MD [facility]

## 2023-01-10 NOTE — Patient Instructions (Signed)
_______________________________________________________  If your blood pressure at your visit was 140/90 or greater, please contact your primary care physician to follow up on this.  _______________________________________________________  If you are age 77 or older, your body mass index should be between 23-30. Your Body mass index is 39.61 kg/m. If this is out of the aforementioned range listed, please consider follow up with your Primary Care Provider.  If you are age 18 or younger, your body mass index should be between 19-25. Your Body mass index is 39.61 kg/m. If this is out of the aformentioned range listed, please consider follow up with your Primary Care Provider.   ________________________________________________________  The [facility] providers would like to encourage you to use [facility] to communicate with providers for non-urgent requests or questions.  Due to long hold times on the telephone, sending your provider a message by [facility] may be a faster and more efficient way to get a response.  Please allow 48 business hours for a response.  Please remember that this is for non-urgent requests.  _______________________________________________________  Thank you for entrusting me with your care and for choosing [facility], Dr. Ileene Patrick

## 2023-01-13 ENCOUNTER — Other Ambulatory Visit: Payer: Self-pay | Admitting: Hematology and Oncology

## 2023-01-13 DIAGNOSIS — N182 Chronic kidney disease, stage 2 (mild): Secondary | ICD-10-CM | POA: Diagnosis not present

## 2023-01-13 DIAGNOSIS — I129 Hypertensive chronic kidney disease with stage 1 through stage 4 chronic kidney disease, or unspecified chronic kidney disease: Secondary | ICD-10-CM | POA: Diagnosis not present

## 2023-01-13 DIAGNOSIS — E782 Mixed hyperlipidemia: Secondary | ICD-10-CM | POA: Diagnosis not present

## 2023-01-19 DIAGNOSIS — M47814 Spondylosis without myelopathy or radiculopathy, thoracic region: Secondary | ICD-10-CM | POA: Diagnosis not present

## 2023-01-19 DIAGNOSIS — M170 Bilateral primary osteoarthritis of knee: Secondary | ICD-10-CM | POA: Diagnosis not present

## 2023-01-31 DIAGNOSIS — J019 Acute sinusitis, unspecified: Secondary | ICD-10-CM | POA: Diagnosis not present

## 2023-01-31 DIAGNOSIS — N182 Chronic kidney disease, stage 2 (mild): Secondary | ICD-10-CM | POA: Diagnosis not present

## 2023-01-31 DIAGNOSIS — I129 Hypertensive chronic kidney disease with stage 1 through stage 4 chronic kidney disease, or unspecified chronic kidney disease: Secondary | ICD-10-CM | POA: Diagnosis not present

## 2023-01-31 DIAGNOSIS — Z23 Encounter for immunization: Secondary | ICD-10-CM | POA: Diagnosis not present

## 2023-02-15 NOTE — Assessment & Plan Note (Signed)
10/23/2017: Screening detected 5 mm indeterminate mass left breast upper outer quadrant biopsy revealed intermediate grade DCIS ER 100%, PR 100%, Tis N0 stage 0   Treatment plan: Active surveillance with tamoxifen and mammograms every 6 months.  Patient decided not to undergo surgery. Current treatment: Tamoxifen 20 mg daily started 10/31/2017 We might stop tamoxifen after 03/09/2023.  It is unclear whether she will need to stay on it for longer than that.   Tamoxifen toxicities: Denies any hot flashes or myalgias.   Breast cancer surveillance: 1.  Left breast mammogram 06/07/2022: Benign breast density category B  2.  Breast exam 02/17/2023: Benign   Her husband passed away in March 08, 2021. Return to clinic in 1 year for follow-up

## 2023-02-17 ENCOUNTER — Ambulatory Visit: Payer: Medicare Other | Admitting: Hematology and Oncology

## 2023-02-17 DIAGNOSIS — D0512 Intraductal carcinoma in situ of left breast: Secondary | ICD-10-CM

## 2023-03-06 DIAGNOSIS — H6121 Impacted cerumen, right ear: Secondary | ICD-10-CM | POA: Diagnosis not present

## 2023-03-06 DIAGNOSIS — H6992 Unspecified Eustachian tube disorder, left ear: Secondary | ICD-10-CM | POA: Diagnosis not present

## 2023-03-10 ENCOUNTER — Inpatient Hospital Stay: Payer: Medicare Other | Attending: Hematology and Oncology | Admitting: Hematology and Oncology

## 2023-03-10 ENCOUNTER — Other Ambulatory Visit: Payer: Self-pay

## 2023-03-10 VITALS — BP 168/86 | HR 120 | Temp 97.9°F | Resp 17 | Wt 236.3 lb

## 2023-03-10 DIAGNOSIS — Z79810 Long term (current) use of selective estrogen receptor modulators (SERMs): Secondary | ICD-10-CM | POA: Diagnosis not present

## 2023-03-10 DIAGNOSIS — K922 Gastrointestinal hemorrhage, unspecified: Secondary | ICD-10-CM | POA: Insufficient documentation

## 2023-03-10 DIAGNOSIS — Z1721 Progesterone receptor positive status: Secondary | ICD-10-CM | POA: Diagnosis not present

## 2023-03-10 DIAGNOSIS — Z170 Estrogen receptor positive status [ER+]: Secondary | ICD-10-CM | POA: Diagnosis not present

## 2023-03-10 DIAGNOSIS — D0512 Intraductal carcinoma in situ of left breast: Secondary | ICD-10-CM | POA: Insufficient documentation

## 2023-03-10 DIAGNOSIS — Z8719 Personal history of other diseases of the digestive system: Secondary | ICD-10-CM | POA: Insufficient documentation

## 2023-03-10 DIAGNOSIS — G8929 Other chronic pain: Secondary | ICD-10-CM | POA: Diagnosis not present

## 2023-03-10 MED ORDER — TAMOXIFEN CITRATE 20 MG PO TABS: 20.0000 mg | ORAL_TABLET | Freq: Every day | ORAL | 3 refills | Status: DC

## 2023-03-10 NOTE — Assessment & Plan Note (Signed)
10/23/2017: Screening detected 5 mm indeterminate mass left breast upper outer quadrant biopsy revealed intermediate grade DCIS ER 100%, PR 100%, Tis N0 stage 0   Treatment plan: Active surveillance with tamoxifen and mammograms every 6 months.  Patient decided not to undergo surgery. Current treatment: Tamoxifen 20 mg daily started 10/31/2017 We might stop tamoxifen after 04-04-22.  It is unclear whether she will need to stay on it for longer than that.   Tamoxifen toxicities: Denies any hot flashes or myalgias.   Breast cancer surveillance: 1.  Left breast mammogram 06/07/2022: Benign breast density category B 2.  Breast exam 03/10/2023: Benign Annual mammograms should have been done in Sep 22, 2024Her husband passed away in 04/04/20.  Return to clinic in 1 year for follow-up
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
## 2023-03-13 DIAGNOSIS — H6992 Unspecified Eustachian tube disorder, left ear: Secondary | ICD-10-CM | POA: Diagnosis not present

## 2023-03-13 DIAGNOSIS — H6121 Impacted cerumen, right ear: Secondary | ICD-10-CM | POA: Diagnosis not present

## 2023-03-13 DIAGNOSIS — I4719 Other supraventricular tachycardia: Secondary | ICD-10-CM | POA: Diagnosis not present

## 2023-03-14 DIAGNOSIS — H6121 Impacted cerumen, right ear: Secondary | ICD-10-CM | POA: Diagnosis not present

## 2023-03-15 DIAGNOSIS — E782 Mixed hyperlipidemia: Secondary | ICD-10-CM | POA: Diagnosis not present

## 2023-03-15 DIAGNOSIS — I129 Hypertensive chronic kidney disease with stage 1 through stage 4 chronic kidney disease, or unspecified chronic kidney disease: Secondary | ICD-10-CM | POA: Diagnosis not present

## 2023-03-15 DIAGNOSIS — N182 Chronic kidney disease, stage 2 (mild): Secondary | ICD-10-CM | POA: Diagnosis not present

## 2023-04-08 ENCOUNTER — Other Ambulatory Visit: Payer: Self-pay | Admitting: Cardiology

## 2023-04-28 DIAGNOSIS — E782 Mixed hyperlipidemia: Secondary | ICD-10-CM | POA: Diagnosis not present

## 2023-04-28 DIAGNOSIS — I129 Hypertensive chronic kidney disease with stage 1 through stage 4 chronic kidney disease, or unspecified chronic kidney disease: Secondary | ICD-10-CM | POA: Diagnosis not present

## 2023-05-03 ENCOUNTER — Telehealth: Payer: Self-pay | Admitting: Cardiology

## 2023-05-03 MED ORDER — METOPROLOL SUCCINATE ER 25 MG PO TB24: 25.0000 mg | ORAL_TABLET | Freq: Every day | ORAL | 3 refills | Status: DC

## 2023-05-03 NOTE — Telephone Encounter (Signed)
Pt c/o medication issue:  1. Name of Medication:   metoprolol succinate (TOPROL-XL) 25 MG 24 hr tablet    2. How are you currently taking this medication (dosage and times per day)? Take 0.5 tablets (12.5 mg total) by mouth daily.   3. Are you having a reaction (difficulty breathing--STAT)? No  4. What is your medication issue? Patient was taking half a tablet by mouth daily, but the medication was not working properly. Patient stated that the dosage amount was changed to 1 full tablet by mouth daily. Patient is needing the updated prescription sent to [id number] - [place], [place] - [address] AT [facility] [address]

## 2023-05-03 NOTE — Telephone Encounter (Signed)
 RX sent

## 2023-06-02 DIAGNOSIS — S301XXA Contusion of abdominal wall, initial encounter: Secondary | ICD-10-CM | POA: Diagnosis not present

## 2023-06-02 DIAGNOSIS — W19XXXA Unspecified fall, initial encounter: Secondary | ICD-10-CM | POA: Diagnosis not present

## 2023-06-02 DIAGNOSIS — S299XXA Unspecified injury of thorax, initial encounter: Secondary | ICD-10-CM | POA: Diagnosis not present

## 2023-06-03 DIAGNOSIS — S299XXA Unspecified injury of thorax, initial encounter: Secondary | ICD-10-CM | POA: Diagnosis not present

## 2023-06-03 DIAGNOSIS — S301XXA Contusion of abdominal wall, initial encounter: Secondary | ICD-10-CM | POA: Diagnosis not present

## 2023-06-13 DIAGNOSIS — E782 Mixed hyperlipidemia: Secondary | ICD-10-CM | POA: Diagnosis not present

## 2023-06-13 DIAGNOSIS — G4700 Insomnia, unspecified: Secondary | ICD-10-CM | POA: Diagnosis not present

## 2023-07-04 DIAGNOSIS — J329 Chronic sinusitis, unspecified: Secondary | ICD-10-CM | POA: Diagnosis not present

## 2023-07-04 DIAGNOSIS — R059 Cough, unspecified: Secondary | ICD-10-CM | POA: Diagnosis not present

## 2023-07-12 ENCOUNTER — Other Ambulatory Visit (HOSPITAL_BASED_OUTPATIENT_CLINIC_OR_DEPARTMENT_OTHER): Payer: Self-pay

## 2023-07-12 DIAGNOSIS — R0789 Other chest pain: Secondary | ICD-10-CM | POA: Diagnosis not present

## 2023-07-12 DIAGNOSIS — I499 Cardiac arrhythmia, unspecified: Secondary | ICD-10-CM | POA: Diagnosis not present

## 2023-07-12 DIAGNOSIS — M109 Gout, unspecified: Secondary | ICD-10-CM | POA: Diagnosis not present

## 2023-07-12 DIAGNOSIS — R000 Tachycardia, unspecified: Secondary | ICD-10-CM | POA: Diagnosis not present

## 2023-07-12 DIAGNOSIS — R9431 Abnormal electrocardiogram [ECG] [EKG]: Secondary | ICD-10-CM | POA: Diagnosis not present

## 2023-07-13 DIAGNOSIS — E782 Mixed hyperlipidemia: Secondary | ICD-10-CM | POA: Diagnosis not present

## 2023-07-13 DIAGNOSIS — G4700 Insomnia, unspecified: Secondary | ICD-10-CM | POA: Diagnosis not present

## 2023-08-04 ENCOUNTER — Ambulatory Visit

## 2023-08-04 ENCOUNTER — Telehealth: Payer: Self-pay | Admitting: Cardiology

## 2023-08-04 ENCOUNTER — Emergency Department (HOSPITAL_COMMUNITY)

## 2023-08-04 ENCOUNTER — Encounter (HOSPITAL_COMMUNITY): Payer: Self-pay

## 2023-08-04 ENCOUNTER — Other Ambulatory Visit: Payer: Self-pay

## 2023-08-04 ENCOUNTER — Inpatient Hospital Stay (HOSPITAL_COMMUNITY)
Admission: EM | Admit: 2023-08-04 | Discharge: 2023-08-07 | DRG: 308 | Disposition: A | Discharge status: Home Health | Source: Ambulatory Visit | Attending: Internal Medicine | Admitting: Internal Medicine

## 2023-08-04 VITALS — BP 140/100 | HR 131 | Ht 65.0 in | Wt 235.0 lb

## 2023-08-04 DIAGNOSIS — K219 Gastro-esophageal reflux disease without esophagitis: Secondary | ICD-10-CM | POA: Diagnosis not present

## 2023-08-04 DIAGNOSIS — Z79810 Long term (current) use of selective estrogen receptor modulators (SERMs): Secondary | ICD-10-CM

## 2023-08-04 DIAGNOSIS — G8929 Other chronic pain: Secondary | ICD-10-CM | POA: Diagnosis not present

## 2023-08-04 DIAGNOSIS — R000 Tachycardia, unspecified: Secondary | ICD-10-CM | POA: Diagnosis not present

## 2023-08-04 DIAGNOSIS — R0989 Other specified symptoms and signs involving the circulatory and respiratory systems: Secondary | ICD-10-CM | POA: Diagnosis not present

## 2023-08-04 DIAGNOSIS — N1831 Chronic kidney disease, stage 3a: Secondary | ICD-10-CM | POA: Diagnosis not present

## 2023-08-04 DIAGNOSIS — I10 Essential (primary) hypertension: Secondary | ICD-10-CM | POA: Insufficient documentation

## 2023-08-04 DIAGNOSIS — I4719 Other supraventricular tachycardia: Secondary | ICD-10-CM

## 2023-08-04 DIAGNOSIS — I2510 Atherosclerotic heart disease of native coronary artery without angina pectoris: Secondary | ICD-10-CM | POA: Diagnosis present

## 2023-08-04 DIAGNOSIS — I498 Other specified cardiac arrhythmias: Principal | ICD-10-CM

## 2023-08-04 DIAGNOSIS — Z952 Presence of prosthetic heart valve: Secondary | ICD-10-CM | POA: Diagnosis not present

## 2023-08-04 DIAGNOSIS — Z7982 Long term (current) use of aspirin: Secondary | ICD-10-CM

## 2023-08-04 DIAGNOSIS — Z79899 Other long term (current) drug therapy: Secondary | ICD-10-CM | POA: Diagnosis not present

## 2023-08-04 DIAGNOSIS — I493 Ventricular premature depolarization: Secondary | ICD-10-CM | POA: Diagnosis present

## 2023-08-04 DIAGNOSIS — I5033 Acute on chronic diastolic (congestive) heart failure: Secondary | ICD-10-CM | POA: Diagnosis not present

## 2023-08-04 DIAGNOSIS — Z953 Presence of xenogenic heart valve: Secondary | ICD-10-CM | POA: Diagnosis not present

## 2023-08-04 DIAGNOSIS — Z66 Do not resuscitate: Secondary | ICD-10-CM | POA: Diagnosis present

## 2023-08-04 DIAGNOSIS — I700 Atherosclerosis of aorta: Secondary | ICD-10-CM | POA: Diagnosis not present

## 2023-08-04 DIAGNOSIS — Z90710 Acquired absence of both cervix and uterus: Secondary | ICD-10-CM

## 2023-08-04 DIAGNOSIS — I5032 Chronic diastolic (congestive) heart failure: Secondary | ICD-10-CM | POA: Diagnosis not present

## 2023-08-04 DIAGNOSIS — I071 Rheumatic tricuspid insufficiency: Secondary | ICD-10-CM | POA: Diagnosis not present

## 2023-08-04 DIAGNOSIS — N183 Chronic kidney disease, stage 3 unspecified: Secondary | ICD-10-CM | POA: Diagnosis present

## 2023-08-04 DIAGNOSIS — M353 Polymyalgia rheumatica: Secondary | ICD-10-CM | POA: Diagnosis not present

## 2023-08-04 DIAGNOSIS — Z86718 Personal history of other venous thrombosis and embolism: Secondary | ICD-10-CM | POA: Diagnosis not present

## 2023-08-04 DIAGNOSIS — E785 Hyperlipidemia, unspecified: Secondary | ICD-10-CM | POA: Diagnosis present

## 2023-08-04 DIAGNOSIS — R079 Chest pain, unspecified: Secondary | ICD-10-CM | POA: Diagnosis not present

## 2023-08-04 DIAGNOSIS — R918 Other nonspecific abnormal finding of lung field: Secondary | ICD-10-CM | POA: Diagnosis not present

## 2023-08-04 DIAGNOSIS — Z86000 Personal history of in-situ neoplasm of breast: Secondary | ICD-10-CM

## 2023-08-04 DIAGNOSIS — I130 Hypertensive heart and chronic kidney disease with heart failure and stage 1 through stage 4 chronic kidney disease, or unspecified chronic kidney disease: Secondary | ICD-10-CM | POA: Diagnosis present

## 2023-08-04 DIAGNOSIS — I4892 Unspecified atrial flutter: Secondary | ICD-10-CM | POA: Diagnosis not present

## 2023-08-04 DIAGNOSIS — I499 Cardiac arrhythmia, unspecified: Secondary | ICD-10-CM | POA: Diagnosis not present

## 2023-08-04 DIAGNOSIS — I517 Cardiomegaly: Secondary | ICD-10-CM | POA: Diagnosis not present

## 2023-08-04 DIAGNOSIS — G629 Polyneuropathy, unspecified: Secondary | ICD-10-CM | POA: Diagnosis not present

## 2023-08-04 HISTORY — DX: Other supraventricular tachycardia: I47.19

## 2023-08-04 LAB — CBC WITH DIFFERENTIAL/PLATELET
Abs Immature Granulocytes: 0.07 10*3/uL (ref 0.00–0.07)
Basophils Absolute: 0 10*3/uL (ref 0.0–0.1)
Basophils Relative: 0 %
Eosinophils Absolute: 0.2 10*3/uL (ref 0.0–0.5)
Eosinophils Relative: 2 %
HCT: 34.4 % — ABNORMAL LOW (ref 36.0–46.0)
Hemoglobin: 11 g/dL — ABNORMAL LOW (ref 12.0–15.0)
Immature Granulocytes: 1 %
Lymphocytes Relative: 26 %
Lymphs Abs: 2.1 10*3/uL (ref 0.7–4.0)
MCH: 30.3 pg (ref 26.0–34.0)
MCHC: 32 g/dL (ref 30.0–36.0)
MCV: 94.8 fL (ref 80.0–100.0)
Monocytes Absolute: 0.6 10*3/uL (ref 0.1–1.0)
Monocytes Relative: 8 %
Neutro Abs: 5.1 10*3/uL (ref 1.7–7.7)
Neutrophils Relative %: 63 %
Platelets: 178 10*3/uL (ref 150–400)
RBC: 3.63 MIL/uL — ABNORMAL LOW (ref 3.87–5.11)
RDW: 13.2 % (ref 11.5–15.5)
WBC: 8.1 10*3/uL (ref 4.0–10.5)
nRBC: 0 % (ref 0.0–0.2)

## 2023-08-04 LAB — BASIC METABOLIC PANEL WITH GFR
Anion gap: 10 (ref 5–15)
BUN: 18 mg/dL (ref 8–23)
CO2: 25 mmol/L (ref 22–32)
Calcium: 9.3 mg/dL (ref 8.9–10.3)
Chloride: 109 mmol/L (ref 98–111)
Creatinine, Ser: 1.44 mg/dL — ABNORMAL HIGH (ref 0.44–1.00)
GFR, Estimated: 37 mL/min — ABNORMAL LOW (ref >60)
Glucose, Bld: 103 mg/dL — ABNORMAL HIGH (ref 70–99)
Potassium: 3.8 mmol/L (ref 3.5–5.1)
Sodium: 144 mmol/L (ref 135–145)

## 2023-08-04 LAB — TROPONIN I (HIGH SENSITIVITY)
Troponin I (High Sensitivity): 8 ng/L (ref <18)
Troponin I (High Sensitivity): 9 ng/L (ref <18)

## 2023-08-04 LAB — MAGNESIUM: Magnesium: 2 mg/dL (ref 1.7–2.4)

## 2023-08-04 LAB — BRAIN NATRIURETIC PEPTIDE: B Natriuretic Peptide: 37.4 pg/mL (ref 0.0–100.0)

## 2023-08-04 LAB — I-STAT CG4 LACTIC ACID, ED: Lactic Acid, Venous: 1.6 mmol/L (ref 0.5–1.9)

## 2023-08-04 MED ORDER — DILTIAZEM HCL 100 MG IV SOLR: 10.0000 mg | Freq: Once | INTRAVENOUS | Status: DC

## 2023-08-04 MED ORDER — DILTIAZEM HCL 25 MG/5ML IV SOLN
10.0000 mg | Freq: Once | INTRAVENOUS | Status: AC
  Administered 2023-08-04: 10 mg via INTRAVENOUS
  Filled 2023-08-04: qty 5

## 2023-08-04 MED ORDER — IOHEXOL 350 MG/ML SOLN
75.0000 mL | Freq: Once | INTRAVENOUS | Status: AC | PRN
  Administered 2023-08-04: 75 mL via INTRAVENOUS

## 2023-08-04 NOTE — Telephone Encounter (Signed)
 STAT if HR is under 50 or over 120 (normal HR is 60-100 beats per minute)  What is your heart rate? It has been running from 111 to 130- at this tine it is 151  Do you have a log of your heart rate readings (document readings)?   Do you have any other symptoms? Feet swelling

## 2023-08-04 NOTE — ED Notes (Signed)
 Pt to CT scan.

## 2023-08-04 NOTE — H&P (Signed)
 History and Physical    Elizabeth [name] ZOX:[id number] DOB: [birth year] DOA: 08/04/2023  PCP: Lonie Roa, MD  Patient coming from: Home  Chief Complaint: Elevated heart rate  HPI: Elizabeth [name] is a 78 y.o. female with medical history significant of ectopic atrial tachycardia with intermittent bradycardia, severe symptomatic aortic stenosis status post TAVR in March 2021, mild nonobstructive CAD, chronic HFpEF, hypertension, hyperlipidemia, CKD stage IIIa, rectal bleeding due to grade 3 hemorrhoids, history of GI bleed, history of breast cancer, anxiety, polymyalgia rheumatica sent to the ED by cardiology for evaluation of tachycardia, chest pain, shortness of breath, and bilateral lower extremity edema.  Heart rate in the 130s on arrival and EKG showing ectopic atrial tachycardia with occasional PVCs.  Labs showing no leukocytosis, hemoglobin 11.0 (stable), potassium 3.8, magnesium  2.0, creatinine 1.4 (baseline 1.0-1.2), troponin negative x 2, BNP normal, lactic acid normal.  CTA chest negative for large or central PE and showing no acute airspace disease.  Cardiology consulted and [facility] called to admit.  Patient is reporting 3-day history of dizziness/lightheadedness, tachycardia, and bilateral leg swelling.  She doubled up the dose of her home metoprolol  but it did not help.  She normally takes Lasix  once a day but took 2 tablets once but her bilateral lower extremity edema did not improve.  She is reporting heart rate between 110-150s at home.  Due to these symptoms, she went to see her cardiologist yesterday and at the office she was noted to be tachycardic so sent to the ED for further evaluation.  She is not aware of having previous history of A-fib or flutter and is not on anticoagulation.  Does report history of previous rectal bleeding related to hemorrhoids requiring multiple hospitalizations including [facility] and in [place].  Denies active bleeding or abdominal pain at  this time.  No other complaints.  Denies fevers, chills, cough, shortness of breath, chest pain, nausea, vomiting, or any urinary symptoms.  Review of Systems:  Review of Systems  All other systems reviewed and are negative.   Past Medical History:  Diagnosis Date   Anxiety    extreme   B12 deficiency 01/03/2022   Benign hypertension with CKD (chronic kidney disease), stage II    Bilateral primary osteoarthritis of knee 12/11/2019   Breast cancer (HCC)    Pre-cancerous cell in Left breast   Chronic diastolic heart failure (HCC) 12/20/2018   Chronic pain of both knees 09/12/2017   Elizabeth [name] is a 78 y.o. female with low back pain, bilateral knee pain, due to multilevel multifactorial degenerative changes in the lumbar spine, and, significant multiple joint osteoarthritis.   Last Assessment & Plan:  Today I will treat with bilateral knee injections.   Elizabeth [name] will return to the clinic in 6 months.  I will asses the efficacy of this treatment and make adjustments to her    CKD (chronic kidney disease), stage III (HCC)    Claustrophobia    Ductal carcinoma in situ (DCIS) of left breast 11/08/2017   Gastroesophageal reflux disease without esophagitis 01/02/2022   GERD (gastroesophageal reflux disease)    Grade III hemorrhoids 08/05/2022   Herpes zoster with complication 09/29/2022   History of DVT of lower extremity 01/02/2022   History of kidney stones    Hypertension 09/25/2018   Hyperuricemia    Immobility 01/09/2022   Insomnia 09/25/2018   Iron deficiency anemia 01/03/2022   Lower GI bleed 08/05/2022   Lumbar radiculopathy 11/28/2017   Elizabeth [name]  is a 78 y.o. female with low back pain, lower extremity pain, on the left, in the setting of previous laminotomy in April 2019.   Last Assessment & Plan:  Today I will treat with current OTC medications..   Elizabeth [name] will return to the clinic on an as needed basis.  I will asses the efficacy of this treatment and make adjustments to  her treatment plan as necessary.  Last Assessment &   Lumbar stenosis    Osteoarthrosis    Other chronic pain 09/29/2022   Polymyalgia rheumatica (HCC) 01/02/2022   Popliteal pain 05/27/2020   Primary hypertension 09/25/2018   Rectal bleeding 08/05/2022   S/P TAVR (transcatheter aortic valve replacement) 05/28/2019   s/p TAVR with a 29 mm Medtronic Evolut Pro + via the TF approach by Dr. Arlester Ladd and Dr. Sherene Dilling    Severe aortic stenosis    Tachycardia 09/25/2018   Tarsal tunnel syndrome of right side 08/21/2019   Vitamin D  deficiency     Past Surgical History:  Procedure Laterality Date   ABDOMINAL HYSTERECTOMY     ABDOMINAL HYSTERECTOMY     APPENDECTOMY     BACK SURGERY     BREAST BIOPSY Left 10/2017   CARDIAC CATHETERIZATION  03/11/2019   CHOLECYSTECTOMY     GALLBLADDER SURGERY     INTRAOPERATIVE TRANSTHORACIC ECHOCARDIOGRAM  05/28/2019   Procedure: Intraoperative Transthoracic Echocardiogram;  Surgeon: Arnoldo Lapping, MD;  Location: [facility] OR;  Service: Open Heart Surgery;;   IR RADIOLOGY PERIPHERAL GUIDED IV START  05/14/2019   IR US  GUIDE VASC ACCESS RIGHT  05/14/2019   LUMBAR LAMINECTOMY/DECOMPRESSION MICRODISCECTOMY N/A 06/12/2017   Procedure: LAMINECTOMY AND FORAMINOTOMY LUMBAR THREE- LUMBAR FOUR, LUMBAR FOUR- LUMBAR FIVE;  Surgeon: Garry Kansas, MD;  Location[facility] OR;  Service: Neurosurgery;  Laterality: N/A;   RIGHT/LEFT HEART CATH AND CORONARY ANGIOGRAPHY N/A 03/11/2019   Procedure: RIGHT/LEFT HEART CATH AND CORONARY ANGIOGRAPHY;  Surgeon: Arnoldo Lapping, MD;  Location: [facility] INVASIVE CV LAB;  Service: Cardiovascular;  Laterality: N/A;   TONSILLECTOMY     TRANSCATHETER AORTIC VALVE REPLACEMENT, TRANSFEMORAL N/A 05/28/2019   Procedure: TRANSCATHETER AORTIC VALVE REPLACEMENT, TRANSFEMORAL;  Surgeon: Arnoldo Lapping, MD;  Location: [facility] OR;  Service: Open Heart Surgery;  Laterality: N/A;     reports that she has never smoked. She has never been exposed to tobacco smoke. She  has never used smokeless tobacco. She reports that she does not drink alcohol and does not use drugs.  Allergies  Allergen Reactions   Penicillins Hives and Other (See Comments)    Has patient had a PCN reaction causing immediate rash, facial/tongue/throat swelling, SOB or lightheadedness with hypotension:    #  #  YES  #  #  Has patient had a PCN reaction causing severe rash involving mucus membranes or skin necrosis:    #  #  YES  #  #  Has patient had a PCN reaction that required hospitalization: No Has patient had a PCN reaction occurring within the last 10 years: No   Has patient had a PCN reaction causing immediate rash, facial/tongue/throat swelling, SOB or lightheadedness with hypotension: Yes Has patient had a PCN reaction causing severe rash involving mucus membranes or skin necrosis: Yes Has patient had a PCN reaction that required hospitalization: No Has patient had a PCN reaction occurring within the last 10 years: No If all of the above answers are "NO", then may proceed with Cephalosporin use. Has patient had a PCN reaction causing immediate rash, facial/tongue/throat swelling,  SOB or lightheadedness with hypotension:    #  #  YES  #  #  Has patient had a PCN reaction causing severe rash involving mucus membranes or skin necrosis:    #  #  YES  #  #  Has patient had a PCN reaction that required hospitalization: No Has patient had a PCN reaction occurring within the last 10 years: No   Atorvastatin Itching    Redness and itching all over body    Codeine Nausea And Vomiting    Patient and daughter in law at bedside stated that patient doesn't have any problems with taking this.    Family History  Problem Relation Age of Onset   Alzheimer's disease Mother    Aneurysm Father    Stomach cancer Brother     Prior to Admission medications   Medication Sig Start Date End Date Taking? Authorizing Provider  aspirin  EC 81 MG tablet Take 81 mg by mouth daily. Swallow whole.     [provider]  Calcium-Magnesium -Vitamin D  (CALCIUM 1200+D3 PO) Take 2 tablets by mouth daily.    [provider]  docusate sodium  (COLACE) 100 MG capsule Take 100 mg by mouth every other day.    [provider]  furosemide  (LASIX ) 20 MG tablet TAKE 1 TABLET BY MOUTH DAILY 11/11/22   Hassan Links, MD  gabapentin  (NEURONTIN ) 100 MG capsule Take 200 mg by mouth in the morning.    [provider]  lisinopril  (ZESTRIL ) 10 MG tablet Take 10 mg by mouth daily. 06/12/23   [provider]  metoprolol  succinate (TOPROL -XL) 25 MG 24 hr tablet Take 1 tablet (25 mg total) by mouth daily. 05/03/23   Hassan Links, MD  tamoxifen  (NOLVADEX ) 20 MG tablet Take 1 tablet (20 mg total) by mouth daily. 03/10/23   Cameron Cea, MD    Physical Exam: Vitals:   08/04/23 1810 08/04/23 1812 08/04/23 1857 08/04/23 1945  BP:   123/85 126/66  Pulse:  (!) 131 (!) 125 (!) 111  Resp:   18 18  Temp:      SpO2:   98% 100%  Weight: 106.6 kg     Height: 5\' 5"  (1.651 m)       Physical Exam Vitals reviewed.  Constitutional:      General: She is not in acute distress. HENT:     Head: Normocephalic and atraumatic.  Eyes:     Extraocular Movements: Extraocular movements intact.  Cardiovascular:     Rate and Rhythm: Regular rhythm. Tachycardia present.     Pulses: Normal pulses.  Pulmonary:     Effort: Pulmonary effort is normal. No respiratory distress.     Breath sounds: Normal breath sounds. No wheezing or rales.  Abdominal:     General: Bowel sounds are normal. There is no distension.     Palpations: Abdomen is soft.     Tenderness: There is no abdominal tenderness. There is no guarding.  Musculoskeletal:     Cervical back: Normal range of motion.     Right lower leg: No edema.     Left lower leg: No edema.  Skin:    General: Skin is warm and dry.  Neurological:     General: No focal deficit present.     Mental Status: She is alert and oriented to person,  place, and time.     Labs on Admission: I have personally reviewed following labs and imaging studies  CBC: Recent Labs  Lab 08/04/23  1819  WBC 8.1  NEUTROABS 5.1  HGB 11.0*  HCT 34.4*  MCV 94.8  PLT 178   Basic Metabolic Panel: Recent Labs  Lab 08/04/23 1819  NA 144  K 3.8  CL 109  CO2 25  GLUCOSE 103*  BUN 18  CREATININE 1.44*  CALCIUM 9.3   GFR: Estimated Creatinine Clearance: 39.7 mL/min (A) (by C-G formula based on SCr of 1.44 mg/dL (H)). Liver Function Tests: No results for input(s): "AST", "ALT", "ALKPHOS", "BILITOT", "PROT", "ALBUMIN" in the last 168 hours. No results for input(s): "LIPASE", "AMYLASE" in the last 168 hours. No results for input(s): "AMMONIA" in the last 168 hours. Coagulation Profile: No results for input(s): "INR", "PROTIME" in the last 168 hours. Cardiac Enzymes: No results for input(s): "CKTOTAL", "CKMB", "CKMBINDEX", "TROPONINI" in the last 168 hours. BNP (last 3 results) No results for input(s): "PROBNP" in the last 8760 hours. HbA1C: No results for input(s): "HGBA1C" in the last 72 hours. CBG: No results for input(s): "GLUCAP" in the last 168 hours. Lipid Profile: No results for input(s): "CHOL", "HDL", "LDLCALC", "TRIG", "CHOLHDL", "LDLDIRECT" in the last 72 hours. Thyroid  Function Tests: No results for input(s): "TSH", "T4TOTAL", "FREET4", "T3FREE", "THYROIDAB" in the last 72 hours. Anemia Panel: No results for input(s): "VITAMINB12", "FOLATE", "FERRITIN", "TIBC", "IRON", "RETICCTPCT" in the last 72 hours. Urine analysis:    Component Value Date/Time   COLORURINE YELLOW 08/05/2022 1007   APPEARANCEUR CLEAR 08/05/2022 1007   LABSPEC 1.012 08/05/2022 1007   PHURINE 6.0 08/05/2022 1007   GLUCOSEU NEGATIVE 08/05/2022 1007   HGBUR SMALL (A) 08/05/2022 1007   BILIRUBINUR NEGATIVE 08/05/2022 1007   KETONESUR NEGATIVE 08/05/2022 1007   PROTEINUR NEGATIVE 08/05/2022 1007   NITRITE NEGATIVE 08/05/2022 1007   LEUKOCYTESUR NEGATIVE  08/05/2022 1007    Radiological Exams on Admission: CT Angio Chest PE W/Cm &/Or Wo Cm Result Date: 08/04/2023 CLINICAL DATA:  Tachycardia chest pain EXAM: CT ANGIOGRAPHY CHEST WITH CONTRAST TECHNIQUE: Multidetector CT imaging of the chest was performed using the standard protocol during bolus administration of intravenous contrast. Multiplanar CT image reconstructions and MIPs were obtained to evaluate the vascular anatomy. RADIATION DOSE REDUCTION: This exam was performed according to the departmental dose-optimization program which includes automated exposure control, adjustment of the mA and/or kV according to patient size and/or use of iterative reconstruction technique. CONTRAST:  75mL OMNIPAQUE  IOHEXOL  350 MG/ML SOLN COMPARISON:  Chest x-ray 08/04/2023, CT 01/08/2022, 06/03/2023 FINDINGS: Cardiovascular: Satisfactory opacification of the pulmonary arteries to the segmental level. No evidence of pulmonary embolism. Limited assessment for distal PE in the lower lobes secondary to motion. Status post TAVR. Aortic atherosclerosis. Coronary vascular calcification. Mild cardiomegaly. No pericardial effusion Mediastinum/Nodes: Patent trachea. No suspicious thyroid  mass. No suspicious lymph nodes. Esophagus within normal limits Lungs/Pleura: No acute airspace disease, pleural effusion or pneumothorax. Stable scattered small pulmonary nodules, largest is in the lateral left lower lobe and measures 5 mm on series 6, image 94, no specific imaging follow-up is recommended Upper Abdomen: No acute finding. Right kidney stone measuring 9 mm incompletely visualized. Musculoskeletal: No acute or suspicious osseous abnormality. Review of the MIP images confirms the above findings. IMPRESSION: 1. Negative for acute pulmonary embolus. Limited assessment for distal PE in the lower lobes secondary to motion. 2. Mild cardiomegaly. Status post TAVR. 3. Aortic atherosclerosis. Aortic Atherosclerosis (ICD10-I70.0). Electronically  Signed   By: Esmeralda Hedge M.D.   On: 08/04/2023 20:45   DG Chest 1 View Result Date: 08/04/2023 CLINICAL DATA:  Tachycardia.  Chest pain.  EXAM: CHEST  1 VIEW COMPARISON:  07/12/2023 FINDINGS: The patient is rotated. Borderline cardiomegaly is stable. TAVR. Unchanged mediastinal contours allowing for rotation. No focal airspace disease, pulmonary edema, pleural effusion or pneumothorax. On limited assessment, no acute osseous findings. IMPRESSION: Low lung volumes without acute findings. Electronically Signed   By: Chadwick Colonel M.D.   On: 08/04/2023 19:11    Assessment and Plan  Atrial tachycardia CTA chest negative for large or central PE and showing no acute airspace disease.  Potassium and magnesium  within normal range.  Cardiology saw the patient and felt that this could be atrial flutter. Flecainide  was stopped 4 months ago and this may be the trigger for the recurrent tachycardia.  Cardiology felt that in the setting of prior TAVR, flecainide  is no longer appropriate.  EF preserved on echo a year ago.  Patient was given IV diltiazem 10 mg and heart rate currently in the 110s at rest.  Rhythm does not appear to be atrial flutter on the monitor at this time.  Cardiology has started oral Cardizem 180 mg daily.  Cardiology had suggested starting chronic full dose anticoagulation for atrial flutter but patient is concerned about history of rectal bleeding secondary to hemorrhoids requiring multiple hospitalizations in the past.  Not having active bleeding at this time.  Will defer decision regarding starting chronic anticoagulation to cardiology team.  Will check TSH level.  Chronic HFpEF Last echo done in May 2024 showing EF 55 to 60%, mild to moderate tricuspid valve regurgitation.  Patient has bilateral lower extremity edema, however, BNP normal and CT chest without evidence of pulmonary edema.  Cardiology consulting.  Patient had complained of pain in her right lower leg earlier in the ED and  right lower extremity Doppler ultrasound was ordered to rule out DVT which is currently pending.  CKD stage IIIa Creatinine currently 1.4 and baseline appears to be 1.0-1.2.  Hold home Lasix  and lisinopril  at this time and repeat labs in the morning.  Hypertension Blood pressure currently stable.  Continue Cardizem.  GERD Continue Protonix .  Chronic pain/neuropathy Continue gabapentin .  DVT prophylaxis: Lovenox  Code Status: DNR-prearrest interventions desired (discussed with the patient) Family Communication: No family available at this time. Consults called: Cardiology Level of care: Progressive Care Unit Admission status: It is my clinical opinion that referral for OBSERVATION is reasonable and necessary in this patient based on the above information provided. The aforementioned taken together are felt to place the patient at high risk for further clinical deterioration. However, it is anticipated that the patient may be medically stable for discharge from the hospital within 24 to 48 hours.  Juliette Oh MD [facility]  If 7PM-7AM, please contact night-coverage [email or web address]  08/04/2023, 9:00 PM

## 2023-08-04 NOTE — ED Triage Notes (Signed)
 Pt reports her doctor told her to come to the ER for tachycardia, HR 143 there. She denies CP or SOB. HR 130 in triage.

## 2023-08-04 NOTE — ED Provider Notes (Signed)
 [place] EMERGENCY DEPARTMENT AT [facility] Provider Note  History  Chief Complaint:  Tachycardia  The history is provided by the patient.     Elizabeth [name] is a 78 y.o. female with a history of CKD, hypertension and atrial dysrhythmias who presents the emergency department for tachycardia.  She reports that she has been having tachycardia for approximately 1 week.  She is also noticed that her lower legs have been swelling worse.  She states she has not noticed any fever.  She has noticed some shortness of breath.  She states that she has not noticed any palpitations.  She checks her heart rate intermittently and because she has been on metoprolol .  She states has been running in the 120s to 130s.  This morning it was in the 120s so she called her doctor.  EKG was performed there which was concerning for atrial dysrhythmia with tachycardia.  Therefore they sent the patient to our facility for further evaluation and management.  Past Medical History:  Diagnosis Date   Anxiety    extreme   B12 deficiency 01/03/2022   Benign hypertension with CKD (chronic kidney disease), stage II    Bilateral primary osteoarthritis of knee 12/11/2019   Breast cancer (HCC)    Pre-cancerous cell in Left breast   Chronic diastolic heart failure (HCC) 12/20/2018   Chronic pain of both knees 09/12/2017   Elizabeth [name] is a 78 y.o. female with low back pain, bilateral knee pain, due to multilevel multifactorial degenerative changes in the lumbar spine, and, significant multiple joint osteoarthritis.   Last Assessment & Plan:  Today I will treat with bilateral knee injections.   Elizabeth [name] will return to the clinic in 6 months.  I will asses the efficacy of this treatment and make adjustments to her    CKD (chronic kidney disease), stage III (HCC)    Claustrophobia    Ductal carcinoma in situ (DCIS) of left breast 11/08/2017   Gastroesophageal reflux disease without esophagitis 01/02/2022    GERD (gastroesophageal reflux disease)    Grade III hemorrhoids 08/05/2022   Herpes zoster with complication 09/29/2022   History of DVT of lower extremity 01/02/2022   History of kidney stones    Hypertension 09/25/2018   Hyperuricemia    Immobility 01/09/2022   Insomnia 09/25/2018   Iron deficiency anemia 01/03/2022   Lower GI bleed 08/05/2022   Lumbar radiculopathy 11/28/2017   Elizabeth [name] is a 77 y.o. female with low back pain, lower extremity pain, on the left, in the setting of previous laminotomy in April 2019.   Last Assessment & Plan:  Today I will treat with current OTC medications..   Elizabeth [name] will return to the clinic on an as needed basis.  I will asses the efficacy of this treatment and make adjustments to her treatment plan as necessary.  Last Assessment &   Lumbar stenosis    Osteoarthrosis    Other chronic pain 09/29/2022   Polymyalgia rheumatica (HCC) 01/02/2022   Popliteal pain 05/27/2020   Primary hypertension 09/25/2018   Rectal bleeding 08/05/2022   S/P TAVR (transcatheter aortic valve replacement) 05/28/2019   s/p TAVR with a 29 mm Medtronic Evolut Pro + via the TF approach by Dr. Arlester Ladd and Dr. Sherene Dilling    Severe aortic stenosis    Tachycardia 09/25/2018   Tarsal tunnel syndrome of right side 08/21/2019   Vitamin D  deficiency     Past Surgical History:  Procedure Laterality Date  ABDOMINAL HYSTERECTOMY     ABDOMINAL HYSTERECTOMY     APPENDECTOMY     BACK SURGERY     BREAST BIOPSY Left 10/2017   CARDIAC CATHETERIZATION  03/11/2019   CHOLECYSTECTOMY     GALLBLADDER SURGERY     INTRAOPERATIVE TRANSTHORACIC ECHOCARDIOGRAM  05/28/2019   Procedure: Intraoperative Transthoracic Echocardiogram;  Surgeon: Arnoldo Lapping, MD;  Location: [facility] OR;  Service: Open Heart Surgery;;   IR RADIOLOGY PERIPHERAL GUIDED IV START  05/14/2019   IR US  GUIDE VASC ACCESS RIGHT  05/14/2019   LUMBAR LAMINECTOMY/DECOMPRESSION MICRODISCECTOMY N/A 06/12/2017   Procedure:  LAMINECTOMY AND FORAMINOTOMY LUMBAR THREE- LUMBAR FOUR, LUMBAR FOUR- LUMBAR FIVE;  Surgeon: Garry Kansas, MD;  Location: [facility] OR;  Service: Neurosurgery;  Laterality: N/A;   RIGHT/LEFT HEART CATH AND CORONARY ANGIOGRAPHY N/A 03/11/2019   Procedure: RIGHT/LEFT HEART CATH AND CORONARY ANGIOGRAPHY;  Surgeon: Arnoldo Lapping, MD;  Location: [facility] INVASIVE CV LAB;  Service: Cardiovascular;  Laterality: N/A;   TONSILLECTOMY     TRANSCATHETER AORTIC VALVE REPLACEMENT, TRANSFEMORAL N/A 05/28/2019   Procedure: TRANSCATHETER AORTIC VALVE REPLACEMENT, TRANSFEMORAL;  Surgeon: Arnoldo Lapping, MD;  Location: [facility] OR;  Service: Open Heart Surgery;  Laterality: N/A;    Family History  Problem Relation Age of Onset   Alzheimer's disease Mother    Aneurysm Father    Stomach cancer Brother     Social History   Tobacco Use   Smoking status: Never    Passive exposure: Never   Smokeless tobacco: Never  Vaping Use   Vaping status: Never Used  Substance Use Topics   Alcohol use: No   Drug use: No    Review of Systems  Review of Systems   Reviewed and documented in HPI if pertinent.   Physical Exam   ED Triage Vitals  Encounter Vitals Group     BP 08/04/23 1801 (!) 153/110     Systolic BP Percentile --      Diastolic BP Percentile --      Pulse Rate 08/04/23 1801 60     Resp 08/04/23 1801 16     Temp 08/04/23 1801 98.6 F (37 C)     Temp src --      SpO2 08/04/23 1801 98 %     Weight 08/04/23 1810 235 lb (106.6 kg)     Height 08/04/23 1810 5\' 5"  (1.651 m)     Head Circumference --      Peak Flow --      Pain Score 08/04/23 1810 0     Pain Loc --      Pain Education --      Exclude from Growth Chart --      Physical Exam Vitals and nursing note reviewed.  Constitutional:      General: She is not in acute distress.    Appearance: She is well-developed.  HENT:     Head: Normocephalic and atraumatic.  Eyes:     Conjunctiva/sclera: Conjunctivae normal.  Cardiovascular:     Rate and  Rhythm: Regular rhythm. Tachycardia present.     Heart sounds: No murmur heard. Pulmonary:     Effort: Pulmonary effort is normal. No respiratory distress.     Breath sounds: Normal breath sounds.  Abdominal:     Palpations: Abdomen is soft.     Tenderness: There is no abdominal tenderness.  Musculoskeletal:        General: No swelling.     Cervical back: Neck supple.     Right lower leg: 3+  Edema present.     Left lower leg: 3+ Edema present.  Skin:    General: Skin is warm and dry.     Capillary Refill: Capillary refill takes less than 2 seconds.  Neurological:     Mental Status: She is alert.  Psychiatric:        Mood and Affect: Mood normal.      Procedures   Procedures  ED Course - Medical Decision Making  Brief Overview Elizabeth [name] is a 78 y.o. female who presents as per above.  I have reviewed the nursing documentation for past medical history, family history, and social history and agree.  I have reviewed the patient's vital signs. Tachycardia.  Initial Differential Diagnoses: I am primarily concerned for atrial arrhythmia, ACS, volume overload, electrolyte abnormalities.  Therapies: These medications and interventions were provided for the patient while in the ED.  Medications  diltiazem (CARDIZEM CD) 24 hr capsule 180 mg (has no administration in time range)  iohexol  (OMNIPAQUE ) 350 MG/ML injection 75 mL (75 mLs Intravenous Contrast Given 08/04/23 2003)  diltiazem (CARDIZEM) injection 10 mg (10 mg Intravenous Given 08/04/23 2138)    Testing Results: On my interpretation labs are significant for : Magnesium  2.0 Creatinine 1.44  On my interpretation imaging is significant for: CTA reveals no evidence of PE Chest x-ray without opacity  EKG Interpretation Date/Time:  Friday Aug 04 2023 18:03:37 EDT Ventricular Rate:  131 PR Interval:  178 QRS Duration:  94 QT Interval:  272 QTC Calculation: 401 R Axis:   56  Text Interpretation: Unusual P  axis, possible ectopic atrial tachycardia with occasional Premature ventricular complexes Abnormal QRS-T angle, consider primary T wave abnormality Abnormal ECG When compared with ECG of 04-Aug-2023 14:55, PREVIOUS ECG IS PRESENT whehn compared to prior,  faster rate No STEMI Confirmed by Wynell Heath ([id number]) on 08/04/2023 7:03:11 PM   See the EMR for full details regarding lab and imaging results.   Medical Decision Making 78 year old female who presents the emergency department for shortness of breath, tachycardia and increased leg swelling.  On exam patient does have 3+ pitting edema bilaterally.  She does have a irregular rhythm and tachycardia.  Lungs are clear to auscultation bilaterally.  There is no asymmetry to her swelling in her legs.  Initial concern was for PE, atrial arrhythmia, pneumonia.  There is no evidence of PE on CT imaging.  There is no evidence of pneumonia.  Patient does have bilateral lower extremity edema however does not have pulmonary edema.  She has no increased work of breathing or hypoxia.  I did consult cardiology regarding patient.  They reviewed the EKG and her concern for atrial flutter.  They did recommend a dose of IV diltiazem which they ordered.  It did result in conversion to normal sinus rhythm on telemetry.  We did not capture EKG.  Given that she has been in and out of an atrial arrhythmia I do feel the patient requires admission to the hospital given that she is having lower extremity swelling.  Even though her BNP is not elevated I do think there is a component of volume overload.  I did discuss this with the hospital medicine team and they will admit the patient to their service.  Problems Addressed: Atrial arrhythmia: acute illness or injury that poses a threat to life or bodily functions  Amount and/or Complexity of Data Reviewed Labs: ordered.  Risk Prescription drug management. Decision regarding hospitalization.     ### All radiography  studies, electrocardiograms, and laboratory data were personally reviewed by me and incorporated into my medical decision making. Impression   1. Atrial arrhythmia      Note: Dragon medical dictation software was used in the creation of this note.     Arminda Landmark, MD 08/05/23 0018    Tegeler, Marine Sia, MD 08/07/23 [phone]

## 2023-08-04 NOTE — Consult Note (Signed)
 Cardiology Consultation   Patient ID: Elizabeth [name] MRN: [id number]; DOB: 10-Jan-1946  Admit date: 08/04/2023 Date of Consult: 08/04/2023  PCP:  Lonie Roa, MD   [facility] Providers Cardiologist:  None        Patient Profile:   Elizabeth [name] is a 78 y.o. female with a hx of prior TAVR and diastolic HF who is being seen 06/20/8117 for the evaluation of tachycardia at the request of the ED.  History of Present Illness:   Elizabeth [name] has been noticing in the last 2 weeks that her apple watch is calling out high heart rates, she has a h/o atrial tachycardia and was previously on flecainide .  This was discontinued about 4 months ago and she's been on metoprolol  XL alone, usually at 12.5 mg, but has doubled the dose herself over the last few days.  She's also noticed increased LE swelling and has inc'd her lasix  to 40 daily with some improvement in her swelling.  Rarely, she feels some fluttering in her chest, but for the most part her tachycardia is asymptomatic.  She has had a h/o prior TAVR.  Chest CTA without e/o PE. While I'm in the room w/ her, she is largely asymptomatic. She is intermittently in SR with HR down to the 70s, but then goes back into an SVT with heart rates in the 110s, often triggered by a PAC or PVC.  Not feeling the tachycardia.  Her apple watch is not connected to her iphone, so unfortunately one cannot see the tracking history beyond the 24 hours that's on watch.  What you can see if that her resting HR averages in the 80s range, and when she's up and about, her heart rate can average 140s, suggesting that a lot of her tachycardia seems exertion related.    Past Medical History:  Diagnosis Date   Anxiety    extreme   B12 deficiency 01/03/2022   Benign hypertension with CKD (chronic kidney disease), stage II    Bilateral primary osteoarthritis of knee 12/11/2019   Breast cancer (HCC)    Pre-cancerous cell in Left breast   Chronic  diastolic heart failure (HCC) 12/20/2018   Chronic pain of both knees 09/12/2017   Elizabeth [name] is a 78 y.o. female with low back pain, bilateral knee pain, due to multilevel multifactorial degenerative changes in the lumbar spine, and, significant multiple joint osteoarthritis.   Last Assessment & Plan:  Today I will treat with bilateral knee injections.   Elizabeth [name] will return to the clinic in 6 months.  I will asses the efficacy of this treatment and make adjustments to her    CKD (chronic kidney disease), stage III (HCC)    Claustrophobia    Ductal carcinoma in situ (DCIS) of left breast 11/08/2017   Gastroesophageal reflux disease without esophagitis 01/02/2022   GERD (gastroesophageal reflux disease)    Grade III hemorrhoids 08/05/2022   Herpes zoster with complication 09/29/2022   History of DVT of lower extremity 01/02/2022   History of kidney stones    Hypertension 09/25/2018   Hyperuricemia    Immobility 01/09/2022   Insomnia 09/25/2018   Iron deficiency anemia 01/03/2022   Lower GI bleed 08/05/2022   Lumbar radiculopathy 11/28/2017   Elizabeth [name] is a 78 y.o. female with low back pain, lower extremity pain, on the left, in the setting of previous laminotomy in April 2019.   Last Assessment & Plan:  Today I will treat with current  OTC medications..   Elizabeth [name] will return to the clinic on an as needed basis.  I will asses the efficacy of this treatment and make adjustments to her treatment plan as necessary.  Last Assessment &   Lumbar stenosis    Osteoarthrosis    Other chronic pain 09/29/2022   Polymyalgia rheumatica (HCC) 01/02/2022   Popliteal pain 05/27/2020   Primary hypertension 09/25/2018   Rectal bleeding 08/05/2022   S/P TAVR (transcatheter aortic valve replacement) 05/28/2019   s/p TAVR with a 29 mm Medtronic Evolut Pro + via the TF approach by Dr. Arlester Ladd and Dr. Sherene Dilling    Severe aortic stenosis    Tachycardia 09/25/2018   Tarsal tunnel syndrome of right side  08/21/2019   Vitamin D  deficiency     Past Surgical History:  Procedure Laterality Date   ABDOMINAL HYSTERECTOMY     ABDOMINAL HYSTERECTOMY     APPENDECTOMY     BACK SURGERY     BREAST BIOPSY Left 10/2017   CARDIAC CATHETERIZATION  03/11/2019   CHOLECYSTECTOMY     GALLBLADDER SURGERY     INTRAOPERATIVE TRANSTHORACIC ECHOCARDIOGRAM  05/28/2019   Procedure: Intraoperative Transthoracic Echocardiogram;  Surgeon: Arnoldo Lapping, MD;  Location[facility] OR;  Service: Open Heart Surgery;;   IR RADIOLOGY PERIPHERAL GUIDED IV START  05/14/2019   IR US  GUIDE VASC ACCESS RIGHT  05/14/2019   LUMBAR LAMINECTOMY/DECOMPRESSION MICRODISCECTOMY N/A 06/12/2017   Procedure: LAMINECTOMY AND FORAMINOTOMY LUMBAR THREE- LUMBAR FOUR, LUMBAR FOUR- LUMBAR FIVE;  Surgeon: Garry Kansas, MD;  Location: [facility] OR;  Service: Neurosurgery;  Laterality: N/A;   RIGHT/LEFT HEART CATH AND CORONARY ANGIOGRAPHY N/A 03/11/2019   Procedure: RIGHT/LEFT HEART CATH AND CORONARY ANGIOGRAPHY;  Surgeon: Arnoldo Lapping, MD;  Location: [facility] INVASIVE CV LAB;  Service: Cardiovascular;  Laterality: N/A;   TONSILLECTOMY     TRANSCATHETER AORTIC VALVE REPLACEMENT, TRANSFEMORAL N/A 05/28/2019   Procedure: TRANSCATHETER AORTIC VALVE REPLACEMENT, TRANSFEMORAL;  Surgeon: Arnoldo Lapping, MD;  Location: [facility] OR;  Service: Open Heart Surgery;  Laterality: N/A;       Inpatient Medications: Scheduled Meds:  diltiazem  10 mg Intravenous Once   Continuous Infusions:  PRN Meds:   Allergies:    Allergies  Allergen Reactions   Penicillins Hives and Other (See Comments)    Has patient had a PCN reaction causing immediate rash, facial/tongue/throat swelling, SOB or lightheadedness with hypotension:    #  #  YES  #  #  Has patient had a PCN reaction causing severe rash involving mucus membranes or skin necrosis:    #  #  YES  #  #  Has patient had a PCN reaction that required hospitalization: No Has patient had a PCN reaction occurring within the  last 10 years: No   Has patient had a PCN reaction causing immediate rash, facial/tongue/throat swelling, SOB or lightheadedness with hypotension: Yes Has patient had a PCN reaction causing severe rash involving mucus membranes or skin necrosis: Yes Has patient had a PCN reaction that required hospitalization: No Has patient had a PCN reaction occurring within the last 10 years: No If all of the above answers are "NO", then may proceed with Cephalosporin use. Has patient had a PCN reaction causing immediate rash, facial/tongue/throat swelling, SOB or lightheadedness with hypotension:    #  #  YES  #  #  Has patient had a PCN reaction causing severe rash involving mucus membranes or skin necrosis:    #  #  YES  #  #  Has patient had a PCN reaction that required hospitalization: No Has patient had a PCN reaction occurring within the last 10 years: No   Atorvastatin Itching    Redness and itching all over body    Codeine Nausea And Vomiting    Patient and daughter in law at bedside stated that patient doesn't have any problems with taking this.    Social History:   Social History   Socioeconomic History   Marital status: Married    Spouse name: Not on file   Number of children: 2   Years of education: 12   Highest education level: Not on file  Occupational History   Occupation: child care  Tobacco Use   Smoking status: Never    Passive exposure: Never   Smokeless tobacco: Never  Vaping Use   Vaping status: Never Used  Substance and Sexual Activity   Alcohol use: No   Drug use: No   Sexual activity: Not on file  Other Topics Concern   Not on file  Social History Narrative   Lives with husband in a one story home.  Has 2 children.  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  Education: high school.     Social Drivers of Corporate investment banker Strain: Not on file  Food Insecurity: Not on file  Transportation Needs: Not on file  Physical Activity: Not on file  Stress: Not on  file  Social Connections: Not on file  Intimate Partner Violence: Not on file    Family History:    Family History  Problem Relation Age of Onset   Alzheimer's disease Mother    Aneurysm Father    Stomach cancer Brother      ROS:  Please see the history of present illness.   All other ROS reviewed and negative.     Physical Exam/Data:   Vitals:   08/04/23 1812 08/04/23 1857 08/04/23 1945 08/04/23 2100  BP:  123/85 126/66 (!) 126/53  Pulse: (!) 131 (!) 125 (!) 111 (!) 112  Resp:  18 18 20   Temp:      SpO2:  98% 100% 97%  Weight:      Height:       No intake or output data in the 24 hours ending 08/04/23 2135    08/04/2023    6:10 PM 08/04/2023    2:36 PM 03/10/2023    8:42 AM  Last 3 Weights  Weight (lbs) 235 lb 235 lb 236 lb 4.8 oz  Weight (kg) 106.595 kg 106.595 kg 107.185 kg     Body mass index is 39.11 kg/m.  General:  Well nourished, well developed, in no acute distress HEENT: normal Neck: no JVD Vascular: No carotid bruits; Distal pulses 2+ bilaterally Cardiac:  normal S1, S2; RRR; soft systolic murmur Lungs:  clear to auscultation bilaterally, no wheezing, rhonchi or rales  Abd: soft, nontender, no hepatomegaly  Ext: warm, w 2+ non pitting edema Musculoskeletal:  No deformities, BUE and BLE strength normal and equal Skin: warm and dry  Neuro:  CNs 2-12 intact, no focal abnormalities noted Psych:  Normal affect   Prior echo with preserved EF  Laboratory Data:  High Sensitivity Troponin:   Recent Labs  Lab 08/04/23 1819 08/04/23 2011  TROPONINIHS 8 9     Chemistry Recent Labs  Lab 08/04/23 1819 08/04/23 2011  NA 144  --   K 3.8  --   CL 109  --   CO2 25  --  GLUCOSE 103*  --   BUN 18  --   CREATININE 1.44*  --   CALCIUM 9.3  --   MG  --  2.0  GFRNONAA 37*  --   ANIONGAP 10  --     No results for input(s): "PROT", "ALBUMIN", "AST", "ALT", "ALKPHOS", "BILITOT" in the last 168 hours. Lipids No results for input(s): "CHOL", "TRIG",  "HDL", "LABVLDL", "LDLCALC", "CHOLHDL" in the last 168 hours.  Hematology Recent Labs  Lab 08/04/23 1819  WBC 8.1  RBC 3.63*  HGB 11.0*  HCT 34.4*  MCV 94.8  MCH 30.3  MCHC 32.0  RDW 13.2  PLT 178   Thyroid  No results for input(s): "TSH", "FREET4" in the last 168 hours.  BNP Recent Labs  Lab 08/04/23 1819  BNP 37.4    DDimer No results for input(s): "DDIMER" in the last 168 hours.   Radiology/Studies:  CT Angio Chest PE W/Cm &/Or Wo Cm Result Date: 08/04/2023 CLINICAL DATA:  Tachycardia chest pain EXAM: CT ANGIOGRAPHY CHEST WITH CONTRAST TECHNIQUE: Multidetector CT imaging of the chest was performed using the standard protocol during bolus administration of intravenous contrast. Multiplanar CT image reconstructions and MIPs were obtained to evaluate the vascular anatomy. RADIATION DOSE REDUCTION: This exam was performed according to the departmental dose-optimization program which includes automated exposure control, adjustment of the mA and/or kV according to patient size and/or use of iterative reconstruction technique. CONTRAST:  75mL OMNIPAQUE  IOHEXOL  350 MG/ML SOLN COMPARISON:  Chest x-ray 08/04/2023, CT 01/08/2022, 06/03/2023 FINDINGS: Cardiovascular: Satisfactory opacification of the pulmonary arteries to the segmental level. No evidence of pulmonary embolism. Limited assessment for distal PE in the lower lobes secondary to motion. Status post TAVR. Aortic atherosclerosis. Coronary vascular calcification. Mild cardiomegaly. No pericardial effusion Mediastinum/Nodes: Patent trachea. No suspicious thyroid  mass. No suspicious lymph nodes. Esophagus within normal limits Lungs/Pleura: No acute airspace disease, pleural effusion or pneumothorax. Stable scattered small pulmonary nodules, largest is in the lateral left lower lobe and measures 5 mm on series 6, image 94, no specific imaging follow-up is recommended Upper Abdomen: No acute finding. Right kidney stone measuring 9 mm  incompletely visualized. Musculoskeletal: No acute or suspicious osseous abnormality. Review of the MIP images confirms the above findings. IMPRESSION: 1. Negative for acute pulmonary embolus. Limited assessment for distal PE in the lower lobes secondary to motion. 2. Mild cardiomegaly. Status post TAVR. 3. Aortic atherosclerosis. Aortic Atherosclerosis (ICD10-I70.0). Electronically Signed   By: Esmeralda Hedge M.D.   On: 08/04/2023 20:45   DG Chest 1 View Result Date: 08/04/2023 CLINICAL DATA:  Tachycardia.  Chest pain. EXAM: CHEST  1 VIEW COMPARISON:  07/12/2023 FINDINGS: The patient is rotated. Borderline cardiomegaly is stable. TAVR. Unchanged mediastinal contours allowing for rotation. No focal airspace disease, pulmonary edema, pleural effusion or pneumothorax. On limited assessment, no acute osseous findings. IMPRESSION: Low lung volumes without acute findings. Electronically Signed   By: Chadwick Colonel M.D.   On: 08/04/2023 19:11     Assessment and Plan:  Intermittent atrial tachycardia, likely atrial flutter, with tachycardia to the 120s, then up to 140s w/ exertion, despite low dose home metoprolol .  Flecainide  was stopped 4 months ago and this may be the trigger for the recurrent tachycardia.  In the setting of her prior TAVR, flecainide  is no longer appropriate. EF preserved.  I ordered 10 mg of IV diltiazem to see her response.  If this is able to keep her in SR for longer periods of time, then I would suggest switching  her metoprolol  to diltiazem.  She would benefit from anticoagulation for atrial flutter (apixaban or rivaroxaban).  She's not keen on being hospitalized.  Would benefit from outpt cardiology follow-up to see if ablation might be a possible downstream treatment option. But for now, trying to rate control with diltiazem would be the way to go.  She will also get her granddaughter to set up her apple watch w/ her iphone so that more longitudinal tracking of her arrhythmia can  be achieved, alternatively she can be set up with a Zio monitor.   For questions or updates, please contact [facility] Please consult [email or web address] for contact info under    Signed, Philmore Bream, MD  08/04/2023 9:35 PM

## 2023-08-04 NOTE — Progress Notes (Signed)
   Nurse Visit   Date of Encounter: 08/04/2023 ID: Kareemah, Grounds 1946-01-28, MRN [id number]  PCP:  Lonie Roa, MD   [facility] Providers Cardiologist:  [facility]   Visit Details   VS:  BP (!) 140/100   Pulse (!) 131   Ht 5\' 5"  (1.651 m)   Wt 235 lb (106.6 kg)   SpO2 98%   BMI 39.11 kg/m  , BMI Body mass index is 39.11 kg/m.  Wt Readings from Last 3 Encounters:  08/04/23 235 lb (106.6 kg)  03/10/23 236 lb 4.8 oz (107.2 kg)  01/10/23 238 lb (108 kg)     Reason for visit: Nurse visit for rapid heart rate Performed today: Vitals, EKG, Provider consulted:Madireddy, and Education Changes (medications, testing, etc.) : Pt sent to the ED at [facility] as recommended per Dr. Ronell Coe. Pt is agreeable. Pt's daughter will drive her. Length of Visit: 20 minutes    Medications Adjustments/Labs and Tests Ordered: Orders Placed This Encounter  Procedures   EKG 12-Lead   No orders of the defined types were placed in this encounter.    Signed, Einar Grave, RN  08/04/2023 3:02 PM

## 2023-08-04 NOTE — ED Provider Triage Note (Signed)
 Emergency Medicine Provider Triage Evaluation Note  Elizabeth [name] , a 78 y.o. female  was evaluated in triage.  Pt complains of tachycardia that began this morning when she woke up.  Patient contacted her cardiologist who told her to come here to be evaluated.  Patient states she has been history of blood clots and does have some chest pain and some shortness of breath and right calf pain.  Patient not currently anticoagulated.  Chest pain is substernal and does not radiate.  Review of Systems  Positive:  Negative:   Physical Exam  BP (!) 153/110 (BP Location: Right Arm)   Pulse (!) 131   Temp 98.6 F (37 C)   Resp 16   Ht 5\' 5"  (1.651 m)   Wt 106.6 kg   SpO2 98%   BMI 39.11 kg/m  Gen:   Awake, no distress   Resp:  Normal effort  MSK:   Moves extremities without difficulty  Other:  Right calf tenderness, lungs clear to auscultation bilaterally  Medical Decision Making  Medically screening exam initiated at 6:12 PM.  Appropriate orders placed.  Elizabeth [name] was informed that the remainder of the evaluation will be completed by another provider, this initial triage assessment does not replace that evaluation, and the importance of remaining in the ED until their evaluation is complete.  Workup initiated, patient's heart rate was 131 here in triage and patient is endorsing chest pain along with right calf pain.  Will get ultrasound along with CTA of the chest and PE workup.   Denese Finn, PA-C 08/04/23 [phone]

## 2023-08-04 NOTE — Telephone Encounter (Signed)
 Nurse visit today for EKG due to 1 week rapid heart rate. Denies chest pain or shortness of breath.

## 2023-08-05 ENCOUNTER — Observation Stay (HOSPITAL_COMMUNITY)

## 2023-08-05 DIAGNOSIS — M79661 Pain in right lower leg: Secondary | ICD-10-CM | POA: Diagnosis not present

## 2023-08-05 DIAGNOSIS — Z952 Presence of prosthetic heart valve: Secondary | ICD-10-CM | POA: Diagnosis not present

## 2023-08-05 DIAGNOSIS — I071 Rheumatic tricuspid insufficiency: Secondary | ICD-10-CM | POA: Diagnosis present

## 2023-08-05 DIAGNOSIS — I498 Other specified cardiac arrhythmias: Secondary | ICD-10-CM | POA: Diagnosis present

## 2023-08-05 DIAGNOSIS — Z90710 Acquired absence of both cervix and uterus: Secondary | ICD-10-CM | POA: Diagnosis not present

## 2023-08-05 DIAGNOSIS — Z86000 Personal history of in-situ neoplasm of breast: Secondary | ICD-10-CM | POA: Diagnosis not present

## 2023-08-05 DIAGNOSIS — I5033 Acute on chronic diastolic (congestive) heart failure: Secondary | ICD-10-CM | POA: Diagnosis not present

## 2023-08-05 DIAGNOSIS — G8929 Other chronic pain: Secondary | ICD-10-CM | POA: Diagnosis not present

## 2023-08-05 DIAGNOSIS — I493 Ventricular premature depolarization: Secondary | ICD-10-CM | POA: Diagnosis present

## 2023-08-05 DIAGNOSIS — I5032 Chronic diastolic (congestive) heart failure: Secondary | ICD-10-CM | POA: Diagnosis not present

## 2023-08-05 DIAGNOSIS — K219 Gastro-esophageal reflux disease without esophagitis: Secondary | ICD-10-CM | POA: Diagnosis not present

## 2023-08-05 DIAGNOSIS — I4892 Unspecified atrial flutter: Secondary | ICD-10-CM | POA: Diagnosis not present

## 2023-08-05 DIAGNOSIS — Z953 Presence of xenogenic heart valve: Secondary | ICD-10-CM | POA: Diagnosis not present

## 2023-08-05 DIAGNOSIS — Z79899 Other long term (current) drug therapy: Secondary | ICD-10-CM | POA: Diagnosis not present

## 2023-08-05 DIAGNOSIS — I2510 Atherosclerotic heart disease of native coronary artery without angina pectoris: Secondary | ICD-10-CM | POA: Diagnosis present

## 2023-08-05 DIAGNOSIS — N1831 Chronic kidney disease, stage 3a: Secondary | ICD-10-CM | POA: Diagnosis not present

## 2023-08-05 DIAGNOSIS — I4719 Other supraventricular tachycardia: Secondary | ICD-10-CM | POA: Diagnosis not present

## 2023-08-05 DIAGNOSIS — M353 Polymyalgia rheumatica: Secondary | ICD-10-CM | POA: Diagnosis not present

## 2023-08-05 DIAGNOSIS — E785 Hyperlipidemia, unspecified: Secondary | ICD-10-CM | POA: Diagnosis not present

## 2023-08-05 DIAGNOSIS — I130 Hypertensive heart and chronic kidney disease with heart failure and stage 1 through stage 4 chronic kidney disease, or unspecified chronic kidney disease: Secondary | ICD-10-CM | POA: Diagnosis not present

## 2023-08-05 DIAGNOSIS — Z86718 Personal history of other venous thrombosis and embolism: Secondary | ICD-10-CM | POA: Diagnosis not present

## 2023-08-05 DIAGNOSIS — Z79810 Long term (current) use of selective estrogen receptor modulators (SERMs): Secondary | ICD-10-CM | POA: Diagnosis not present

## 2023-08-05 DIAGNOSIS — Z7982 Long term (current) use of aspirin: Secondary | ICD-10-CM | POA: Diagnosis not present

## 2023-08-05 DIAGNOSIS — G629 Polyneuropathy, unspecified: Secondary | ICD-10-CM | POA: Diagnosis not present

## 2023-08-05 DIAGNOSIS — Z66 Do not resuscitate: Secondary | ICD-10-CM | POA: Diagnosis not present

## 2023-08-05 LAB — COMPREHENSIVE METABOLIC PANEL WITH GFR
ALT: 11 U/L (ref 0–44)
AST: 18 U/L (ref 15–41)
Albumin: 3.3 g/dL — ABNORMAL LOW (ref 3.5–5.0)
Alkaline Phosphatase: 51 U/L (ref 38–126)
Anion gap: 11 (ref 5–15)
BUN: 19 mg/dL (ref 8–23)
CO2: 23 mmol/L (ref 22–32)
Calcium: 9.1 mg/dL (ref 8.9–10.3)
Chloride: 111 mmol/L (ref 98–111)
Creatinine, Ser: 1.34 mg/dL — ABNORMAL HIGH (ref 0.44–1.00)
GFR, Estimated: 41 mL/min — ABNORMAL LOW (ref >60)
Glucose, Bld: 96 mg/dL (ref 70–99)
Potassium: 3.6 mmol/L (ref 3.5–5.1)
Sodium: 145 mmol/L (ref 135–145)
Total Bilirubin: 0.7 mg/dL (ref 0.0–1.2)
Total Protein: 5.9 g/dL — ABNORMAL LOW (ref 6.5–8.1)

## 2023-08-05 LAB — ECHOCARDIOGRAM COMPLETE
AR max vel: 1.72 cm2
AV Area VTI: 1.98 cm2
AV Area mean vel: 1.71 cm2
AV Mean grad: 9 mmHg
AV Peak grad: 13.7 mmHg
Ao pk vel: 1.85 m/s
Area-P 1/2: 4.52 cm2
Height: 65 in
MV VTI: 2.38 cm2
S' Lateral: 4.1 cm
Weight: 3767.22 [oz_av]

## 2023-08-05 LAB — TSH: TSH: 3.684 u[IU]/mL (ref 0.350–4.500)

## 2023-08-05 MED ORDER — FUROSEMIDE 20 MG PO TABS
20.0000 mg | ORAL_TABLET | Freq: Every day | ORAL | Status: DC
  Administered 2023-08-05 – 2023-08-06 (×2): 20 mg via ORAL
  Filled 2023-08-05 (×2): qty 1

## 2023-08-05 MED ORDER — OXYCODONE HCL 5 MG PO TABS
5.0000 mg | ORAL_TABLET | ORAL | Status: DC | PRN
  Administered 2023-08-05 – 2023-08-06 (×4): 10 mg via ORAL
  Filled 2023-08-05 (×4): qty 2

## 2023-08-05 MED ORDER — ASPIRIN 81 MG PO TBEC
81.0000 mg | DELAYED_RELEASE_TABLET | Freq: Every day | ORAL | Status: DC
  Administered 2023-08-05 – 2023-08-07 (×3): 81 mg via ORAL
  Filled 2023-08-05 (×3): qty 1

## 2023-08-05 MED ORDER — DOCUSATE SODIUM 100 MG PO CAPS: 100.0000 mg | ORAL_CAPSULE | Freq: Every day | ORAL | Status: DC | PRN

## 2023-08-05 MED ORDER — GABAPENTIN 100 MG PO CAPS
200.0000 mg | ORAL_CAPSULE | Freq: Every day | ORAL | Status: DC
  Administered 2023-08-05 – 2023-08-07 (×3): 200 mg via ORAL
  Filled 2023-08-05 (×3): qty 2

## 2023-08-05 MED ORDER — POLYETHYLENE GLYCOL 3350 17 G PO PACK
17.0000 g | PACK | Freq: Two times a day (BID) | ORAL | Status: AC
  Filled 2023-08-05 (×2): qty 1

## 2023-08-05 MED ORDER — ENOXAPARIN SODIUM 40 MG/0.4ML IJ SOSY
40.0000 mg | PREFILLED_SYRINGE | INTRAMUSCULAR | Status: DC
  Administered 2023-08-05 – 2023-08-07 (×3): 40 mg via SUBCUTANEOUS
  Filled 2023-08-05 (×3): qty 0.4

## 2023-08-05 MED ORDER — LORAZEPAM 1 MG PO TABS: 1.0000 mg | ORAL_TABLET | ORAL | Status: DC

## 2023-08-05 MED ORDER — ACETAMINOPHEN 650 MG RE SUPP: 650.0000 mg | Freq: Four times a day (QID) | RECTAL | Status: DC | PRN

## 2023-08-05 MED ORDER — ACETAMINOPHEN 325 MG PO TABS
650.0000 mg | ORAL_TABLET | Freq: Four times a day (QID) | ORAL | Status: DC | PRN
  Administered 2023-08-07: 650 mg via ORAL
  Filled 2023-08-05: qty 2

## 2023-08-05 MED ORDER — METOPROLOL TARTRATE 25 MG PO TABS
25.0000 mg | ORAL_TABLET | Freq: Two times a day (BID) | ORAL | Status: DC
  Administered 2023-08-05 – 2023-08-07 (×5): 25 mg via ORAL
  Filled 2023-08-05 (×5): qty 1

## 2023-08-05 MED ORDER — TAMOXIFEN CITRATE 10 MG PO TABS: 20.0000 mg | ORAL_TABLET | Freq: Every day | ORAL | Status: DC

## 2023-08-05 MED ORDER — DILTIAZEM HCL ER COATED BEADS 180 MG PO CP24: 180.0000 mg | ORAL_CAPSULE | Freq: Every day | ORAL | Status: DC

## 2023-08-05 MED ORDER — PANTOPRAZOLE SODIUM 40 MG PO TBEC
40.0000 mg | DELAYED_RELEASE_TABLET | Freq: Every day | ORAL | Status: DC
  Filled 2023-08-05: qty 1

## 2023-08-05 MED ORDER — METOPROLOL TARTRATE 5 MG/5ML IV SOLN
5.0000 mg | Freq: Four times a day (QID) | INTRAVENOUS | Status: DC | PRN
  Administered 2023-08-05: 5 mg via INTRAVENOUS
  Filled 2023-08-05: qty 5

## 2023-08-05 MED ORDER — DILTIAZEM HCL ER COATED BEADS 180 MG PO CP24
180.0000 mg | ORAL_CAPSULE | Freq: Every day | ORAL | Status: DC
  Administered 2023-08-05: 180 mg via ORAL
  Filled 2023-08-05: qty 1

## 2023-08-05 MED ORDER — FLECAINIDE ACETATE 50 MG PO TABS
50.0000 mg | ORAL_TABLET | Freq: Two times a day (BID) | ORAL | Status: DC
  Administered 2023-08-05 – 2023-08-07 (×5): 50 mg via ORAL
  Filled 2023-08-05 (×6): qty 1

## 2023-08-05 MED ORDER — ONDANSETRON HCL 4 MG/2ML IJ SOLN
4.0000 mg | Freq: Four times a day (QID) | INTRAMUSCULAR | Status: DC | PRN
  Administered 2023-08-05 – 2023-08-06 (×4): 4 mg via INTRAVENOUS
  Filled 2023-08-05 (×4): qty 2

## 2023-08-05 NOTE — Progress Notes (Signed)
 VASCULAR LAB    Right lower extremity venous duplex has been performed.  See CV proc for preliminary results.   Lanique Gonzalo, RVT 08/05/2023, 11:48 AM

## 2023-08-05 NOTE — Progress Notes (Signed)
 [facility] PROGRESS NOTE    Progress Note  Elizabeth [name]  AOZ:[id number] DOB: 1945-10-29 DOA: 08/04/2023 PCP: Elizabeth Roa, MD     Brief Narrative:   Elizabeth [name] is an 78 y.o. female past medical history significant for ectopic atrial tachycardia with intermittent bradycardia, severe symptomatic aortic stenosis status post TAVR's in March 2021, nonobstructive coronary artery disease, chronic HFpEF, essential hypertension, chronic kidney disease stage IIIA,  history of GI bleed seen at the cardiologist office for evaluation of chest pain and tachycardia, shortness of breath and bilateral lower extremity edema.  In the ED was found to have a heart rate of 130 twelve-lead EKG showed atrial tachycardia with multiple PVCs hemoglobin of 11 potassium of 3.8 mag of 2.0 creatinine at baseline, cardiac biomarkers are negative, CT angio of the chest was negative for PE   Assessment/Plan:   Chest pain shortness of breath likely due to Atrial tachycardia (HCC) Likely due to flecainide  which was stopped a month ago.  Has a setting of prior TAVR's flecainide  was no longer needed, 2D echo showed preserved EF. On IV diltiazem, now transition to oral diltiazem. It was recommended anticoagulation but the patient is hesitant to do so. Further management per cardiology. His metoprolol  IV as needed for heart rate greater than 100.  Chronic HFpEF: Last 2D echo in 2024 showed an EF of 55%, CT angio showed no PE. Lower extremity Dopplers were sent which is pending. Restarted home dose of Lasix .  Chronic kidney disease stage IIIa: Appears to be at baseline, restart her home dose of Lasix . Hold ACE inhibitor. Blood pressure stable  Essential hypertension: Resume Lasix , currently on diltiazem, hold Lasix .  GERD: Continue PPI.  Chronic pain/peripheral neuropathy: Continue Neurontin .   DVT prophylaxis: lovenox  Family Communication:none Status is: Observation The patient remains  OBS appropriate and will d/c before 2 midnights.    Code Status:     Code Status Orders  (From admission, onward)           Start     Ordered   08/05/23 0032  Do not attempt resuscitation (DNR) Pre-Arrest Interventions Desired  Continuous       Question Answer Comment  If pulseless and not breathing No CPR or chest compressions.   In Pre-Arrest Conditions (Patient Has Pulse and Is Breathing) May intubate, use advanced airway interventions and cardioversion/ACLS medications if appropriate or indicated. May transfer to ICU.   Consent: Discussion documented in EHR or advanced directives reviewed      08/05/23 0033           Code Status History     Date Active Date Inactive Code Status Order ID Comments User Context   08/05/2022 1222 08/08/2022 1805 DNR [id number]  Elizabeth Grip, MD ED   01/09/2022 0256 01/12/2022 1838 Full Code [id number]  [name], Elizabeth Aschoff, MD ED   05/28/2019 1955 05/29/2019 2029 Full Code [id number]  Elizabeth Kraft, PA-C Inpatient   03/11/2019 0951 03/11/2019 1800 Full Code [id number]  Elizabeth Lapping, MD Inpatient   06/12/2017 1057 06/13/2017 1300 Full Code [id number]  Elizabeth Kansas, MD Inpatient         IV Access:   Peripheral IV   Procedures and diagnostic studies:   CT Angio Chest PE W/Cm &/Or Wo Cm Result Date: 08/04/2023 CLINICAL DATA:  Tachycardia chest pain EXAM: CT ANGIOGRAPHY CHEST WITH CONTRAST TECHNIQUE: Multidetector CT imaging of the chest was performed using the standard protocol during bolus administration of intravenous contrast. Multiplanar CT image reconstructions and  MIPs were obtained to evaluate the vascular anatomy. RADIATION DOSE REDUCTION: This exam was performed according to the departmental dose-optimization program which includes automated exposure control, adjustment of the mA and/or kV according to patient size and/or use of iterative reconstruction technique. CONTRAST:  75mL OMNIPAQUE  IOHEXOL  350 MG/ML SOLN  COMPARISON:  Chest x-ray 08/04/2023, CT 01/08/2022, 06/03/2023 FINDINGS: Cardiovascular: Satisfactory opacification of the pulmonary arteries to the segmental level. No evidence of pulmonary embolism. Limited assessment for distal PE in the lower lobes secondary to motion. Status post TAVR. Aortic atherosclerosis. Coronary vascular calcification. Mild cardiomegaly. No pericardial effusion Mediastinum/Nodes: Patent trachea. No suspicious thyroid  mass. No suspicious lymph nodes. Esophagus within normal limits Lungs/Pleura: No acute airspace disease, pleural effusion or pneumothorax. Stable scattered small pulmonary nodules, largest is in the lateral left lower lobe and measures 5 mm on series 6, image 94, no specific imaging follow-up is recommended Upper Abdomen: No acute finding. Right kidney stone measuring 9 mm incompletely visualized. Musculoskeletal: No acute or suspicious osseous abnormality. Review of the MIP images confirms the above findings. IMPRESSION: 1. Negative for acute pulmonary embolus. Limited assessment for distal PE in the lower lobes secondary to motion. 2. Mild cardiomegaly. Status post TAVR. 3. Aortic atherosclerosis. Aortic Atherosclerosis (ICD10-I70.0). Electronically Signed   By: Esmeralda Hedge M.D.   On: 08/04/2023 20:45   DG Chest 1 View Result Date: 08/04/2023 CLINICAL DATA:  Tachycardia.  Chest pain. EXAM: CHEST  1 VIEW COMPARISON:  07/12/2023 FINDINGS: The patient is rotated. Borderline cardiomegaly is stable. TAVR. Unchanged mediastinal contours allowing for rotation. No focal airspace disease, pulmonary edema, pleural effusion or pneumothorax. On limited assessment, no acute osseous findings. IMPRESSION: Low lung volumes without acute findings. Electronically Signed   By: Chadwick Colonel M.D.   On: 08/04/2023 19:11     Medical Consultants:   None.   Subjective:    Elizabeth [name] relates she feels slightly better but her legs are bothering her.  Objective:     Vitals:   08/04/23 2300 08/05/23 0021 08/05/23 0200 08/05/23 0444  BP: 109/67 136/65  111/67  Pulse: (!) 115 (!) 117 (!) 113 (!) 115  Resp: 18 19 19 20   Temp:  97.8 F (36.6 C)  98 F (36.7 C)  TempSrc:  Oral  Oral  SpO2: 97% 96% 96% 99%  Weight:    106.8 kg  Height:       SpO2: 99 %   Intake/Output Summary (Last 24 hours) at 08/05/2023 0633 Last data filed at 08/05/2023 0445 Gross per 24 hour  Intake --  Output 100 ml  Net -100 ml   Filed Weights   08/04/23 1810 08/05/23 0444  Weight: 106.6 kg 106.8 kg    Exam: General exam: In no acute distress. Respiratory system: Good air movement and clear to auscultation. Cardiovascular system: S1 & S2 heard, RRR. No JVD.  Gastrointestinal system: Abdomen is nondistended, soft and nontender.  Extremities: No pedal edema. Skin: No rashes, lesions or ulcers Psychiatry: Judgement and insight appear normal. Mood & affect appropriate.    Data Reviewed:    Labs: Basic Metabolic Panel: Recent Labs  Lab 08/04/23 1819 08/04/23 2011 08/05/23 0057  NA 144  --  145  K 3.8  --  3.6  CL 109  --  111  CO2 25  --  23  GLUCOSE 103*  --  96  BUN 18  --  19  CREATININE 1.44*  --  1.34*  CALCIUM 9.3  --  9.1  MG  --  2.0  --    GFR Estimated Creatinine Clearance: 42.7 mL/min (A) (by C-G formula based on SCr of 1.34 mg/dL (H)). Liver Function Tests: Recent Labs  Lab 08/05/23 0057  AST 18  ALT 11  ALKPHOS 51  BILITOT 0.7  PROT 5.9*  ALBUMIN 3.3*   No results for input(s): "LIPASE", "AMYLASE" in the last 168 hours. No results for input(s): "AMMONIA" in the last 168 hours. Coagulation profile No results for input(s): "INR", "PROTIME" in the last 168 hours. COVID-19 Labs  No results for input(s): "DDIMER", "FERRITIN", "LDH", "CRP" in the last 72 hours.  Lab Results  Component Value Date   SARSCOV2NAA NEGATIVE 05/24/2019   SARSCOV2NAA Not Detected 05/15/2019   SARSCOV2NAA NEGATIVE 03/09/2019    CBC: Recent Labs   Lab 08/04/23 1819  WBC 8.1  NEUTROABS 5.1  HGB 11.0*  HCT 34.4*  MCV 94.8  PLT 178   Cardiac Enzymes: No results for input(s): "CKTOTAL", "CKMB", "CKMBINDEX", "TROPONINI" in the last 168 hours. BNP (last 3 results) No results for input(s): "PROBNP" in the last 8760 hours. CBG: No results for input(s): "GLUCAP" in the last 168 hours. D-Dimer: No results for input(s): "DDIMER" in the last 72 hours. Hgb A1c: No results for input(s): "HGBA1C" in the last 72 hours. Lipid Profile: No results for input(s): "CHOL", "HDL", "LDLCALC", "TRIG", "CHOLHDL", "LDLDIRECT" in the last 72 hours. Thyroid  function studies: Recent Labs    08/05/23 0057  TSH 3.684   Anemia work up: No results for input(s): "VITAMINB12", "FOLATE", "FERRITIN", "TIBC", "IRON", "RETICCTPCT" in the last 72 hours. Sepsis Labs: Recent Labs  Lab 08/04/23 1819 08/04/23 1824  WBC 8.1  --   LATICACIDVEN  --  1.6   Microbiology No results found for this or any previous visit (from the past 240 hours).   Medications:    diltiazem  180 mg Oral Daily   enoxaparin  (LOVENOX ) injection  40 mg Subcutaneous Q24H   gabapentin   200 mg Oral Daily   pantoprazole   40 mg Oral QAC breakfast   Continuous Infusions:    LOS: 0 days   Macdonald Savoy  [facility]  08/05/2023, 6:33 AM

## 2023-08-05 NOTE — Evaluation (Signed)
 Physical Therapy Evaluation Patient Details Name: Elizabeth [name] MRN: [id number] DOB: [birth year] Today's Date: 08/05/2023  History of Present Illness  Patient is 78 yo female admitted on 08/04/23 for tachycardia. PMH includes anxiety, HTN, CKD II, HF, lumbar radiculopathy s/p laminectomy L3-5 2019, TAVR, PE on anticoag.  Clinical Impression  At baseline, patient lives alone in one level home with 3 STE. Patient ambulates independently without use of an assistive device. Reports only 1 fall within the past year. Patient presents today with generalized weakness in B LE, B LE edema, and pain that limits patient's participation in functional mobility. Completes bed mobility and sit to stand transfers with contact guard assist. Able to ambulate 75 feet with 2WW and CGA but limited in distance and gait efficiency by progressive B LE pain. She does report that swelling has improved since admission. Patient will continue to benefit from skilled acute PT services while in house. Recommend use of 2WW and HHPT services upon discharge home.         If plan is discharge home, recommend the following: A little help with walking and/or transfers;A little help with bathing/dressing/bathroom   Can travel by private vehicle    yes    Equipment Recommendations None recommended by PT;Other (comment) (Patient has 2WW for use upon discharge.)  Recommendations for Other Services       Functional Status Assessment Patient has had a recent decline in their functional status and demonstrates the ability to make significant improvements in function in a reasonable and predictable amount of time.     Precautions / Restrictions Precautions Precautions: Fall Restrictions Weight Bearing Restrictions Per Provider Order: No      Mobility  Bed Mobility Overal bed mobility: Needs Assistance Bed Mobility: Supine to Sit, Sit to Supine     Supine to sit: Contact guard Sit to supine: Contact guard assist    General bed mobility comments: cues for safe sequencing; limited by LE pain with minimal difficulty bringing onto EOB    Transfers Overall transfer level: Needs assistance Equipment used: Rolling walker (2 wheels) Transfers: Sit to/from Stand Sit to Stand: Contact guard assist           General transfer comment: cues for safe sequencing to 2WW    Ambulation/Gait Ambulation/Gait assistance: Contact guard assist Gait Distance (Feet): 75 Feet Assistive device: Rolling walker (2 wheels) Gait Pattern/deviations: Decreased step length - right, Decreased step length - left, Wide base of support   Gait velocity interpretation: <1.31 ft/sec, indicative of household ambulator   General Gait Details: requires increased lateral trunk translation for advancing B LE  Stairs            Wheelchair Mobility     Tilt Bed    Modified Rankin (Stroke Patients Only)       Balance Overall balance assessment: Needs assistance Sitting-balance support: No upper extremity supported, Feet supported Sitting balance-Leahy Scale: Fair Sitting balance - Comments: initial dizziness with seated transfer   Standing balance support: Reliant on assistive device for balance Standing balance-Leahy Scale: Poor Standing balance comment: requires UE support on walker secondary to B foot pain                             Pertinent Vitals/Pain Pain Assessment Pain Assessment: 0-10 Pain Score: 8  Pain Location: Bilateral feet Pain Descriptors / Indicators: Aching Pain Intervention(s): Monitored during session, Limited activity within patient's tolerance    Home Living Family/patient  expects to be discharged to:: Private residence Living Arrangements: Alone Available Help at Discharge: Family;Friend(s);Neighbor;Available PRN/intermittently Type of Home: House Home Access: Stairs to enter Entrance Stairs-Rails: Right;Left;Can reach both Entrance Stairs-Number of Steps: 3   Home  Layout: One level Home Equipment: Cane - single Librarian, academic (2 wheels);Rollator (4 wheels);BSC/3in1      Prior Function Prior Level of Function : Independent/Modified Independent             Mobility Comments: Patient reports independence with ambulation and ADLs prior to admission. 1 fall within the past year when she tripped on a rug.       Extremity/Trunk Assessment        Lower Extremity Assessment Lower Extremity Assessment: Generalized weakness    Cervical / Trunk Assessment Cervical / Trunk Assessment: Kyphotic  Communication   Communication Communication: No apparent difficulties    Cognition Arousal: Alert Behavior During Therapy: WFL for tasks assessed/performed   PT - Cognitive impairments: No apparent impairments                         Following commands: Intact       Cueing Cueing Techniques: Verbal cues     General Comments      Exercises     Assessment/Plan    PT Assessment Patient needs continued PT services  PT Problem List Decreased strength;Decreased balance;Pain;Cardiopulmonary status limiting activity;Decreased knowledge of use of DME;Decreased mobility;Decreased activity tolerance       PT Treatment Interventions DME instruction;Functional mobility training;Gait training;Stair training;Therapeutic activities;Therapeutic exercise;Balance training;Patient/family education    PT Goals (Current goals can be found in the Care Plan section)  Acute Rehab PT Goals Patient Stated Goal: Patient's goal is to relieve LE pain to be able to walk independently without a device PT Goal Formulation: With patient Time For Goal Achievement: 08/19/23 Potential to Achieve Goals: Good    Frequency Min 2X/week     Co-evaluation               AM-PAC PT "6 Clicks" Mobility  Outcome Measure Help needed turning from your back to your side while in a flat bed without using bedrails?: A Little Help needed moving from lying  on your back to sitting on the side of a flat bed without using bedrails?: A Little Help needed moving to and from a bed to a chair (including a wheelchair)?: A Little Help needed standing up from a chair using your arms (e.g., wheelchair or bedside chair)?: A Little Help needed to walk in hospital room?: A Little Help needed climbing 3-5 steps with a railing? : A Lot 6 Click Score: 17    End of Session Equipment Utilized During Treatment: Gait belt Activity Tolerance: Patient tolerated treatment well;Other (comment) (B LE pain increased with gait, limiting distance) Patient left: in bed;with call bell/phone within reach;Other (comment) (Staff present to set up for ECHO) Nurse Communication: Mobility status PT Visit Diagnosis: Unsteadiness on feet (R26.81);Other abnormalities of gait and mobility (R26.89);Muscle weakness (generalized) (M62.81)    Time: 7829-5621 PT Time Calculation (min) (ACUTE ONLY): 23 min   Charges:   PT Evaluation $PT Eval Low Complexity: 1 Low PT Treatments $Therapeutic Activity: 8-22 mins PT General Charges $$ ACUTE PT VISIT: 1 Visit         Mariano Shiver, PT, DPT [facility] Acute Rehabilitation Office: [phone]   Davey Erp 08/05/2023, 12:48 PM

## 2023-08-05 NOTE — Evaluation (Signed)
 Occupational Therapy Evaluation and Discharge Patient Details Name: Elizabeth [name] MRN: [id number] DOB: [birth year] Today's Date: 08/05/2023   History of Present Illness   Patient is 78 yo female admitted on 08/04/23 for tachycardia. PMH includes anxiety, HTN, CKD II, HF, lumbar radiculopathy s/p laminectomy L3-5 2019, TAVR, PE on anticoag.     Clinical Impressions Pt up walking to the bathroom with RW upon OT's arrival modified independently. Reports she tried to walk reaching for the sink and door frame, but finds using the RW less painful. Pt reports completing bathing and dressing with chair at sink this morning. Mobility primarily limited by B foot pain and edema. HR 104. Recommend ADLs with set up of nursing staff. Educated pt in energy conservation given her generalized weakness and tachycardia. Pt receptive to education and verbalized understanding. No further OT needs.      If plan is discharge home, recommend the following:   Assistance with cooking/housework;Assist for transportation     Functional Status Assessment   Patient has had a recent decline in their functional status and demonstrates the ability to make significant improvements in function in a reasonable and predictable amount of time.     Equipment Recommendations   None recommended by OT     Recommendations for Other Services         Precautions/Restrictions   Precautions Precautions: Fall Recall of Precautions/Restrictions: Intact Restrictions Weight Bearing Restrictions Per Provider Order: No     Mobility Bed Mobility Overal bed mobility: Modified Independent             General bed mobility comments: increased time    Transfers Overall transfer level: Modified independent Equipment used: Rolling walker (2 wheels)                      Balance Overall balance assessment: Needs assistance   Sitting balance-Leahy Scale: Fair       Standing balance-Leahy Scale:  Poor Standing balance comment: reliant on RW due pain in B feet, reports they feel like she is walking on marshmallows                           ADL either performed or assessed with clinical judgement   ADL Overall ADL's : Modified independent                                       General ADL Comments: pt completed sponge bathing and dressing, sit<>stand at sink, routinely toileting using RW for mobility     Vision Baseline Vision/History: 1 Wears glasses Ability to See in Adequate Light: 0 Adequate Patient Visual Report: No change from baseline       Perception         Praxis         Pertinent Vitals/Pain Pain Assessment Pain Assessment: Faces Faces Pain Scale: Hurts even more Pain Location: Bilateral feet, chronic pain R flank from prior shingles Pain Descriptors / Indicators: Grimacing, Guarding, Discomfort Pain Intervention(s): Monitored during session, Repositioned, Premedicated before session     Extremity/Trunk Assessment Upper Extremity Assessment Upper Extremity Assessment: Overall WFL for tasks assessed   Lower Extremity Assessment Lower Extremity Assessment: Defer to PT evaluation   Cervical / Trunk Assessment Cervical / Trunk Assessment: Kyphotic   Communication Communication Communication: No apparent difficulties   Cognition Arousal: Alert Behavior During Therapy: [facility]  for tasks assessed/performed Cognition: No apparent impairments                               Following commands: Intact       Cueing  General Comments   Cueing Techniques: Verbal cues      Exercises     Shoulder Instructions      Home Living Family/patient expects to be discharged to:: Private residence Living Arrangements: Alone Available Help at Discharge: Family;Friend(s);Neighbor;Available PRN/intermittently Type of Home: House Home Access: Stairs to enter [employer] of Steps: 3 Entrance Stairs-Rails:  Right;Left;Can reach both Home Layout: One level     Bathroom Shower/Tub: Producer, television/film/video: Standard     Home Equipment: Cane - single Librarian, academic (2 wheels);Rollator (4 wheels);BSC/3in1          Prior Functioning/Environment Prior Level of Function : Independent/Modified Independent;Driving             Mobility Comments: Patient reports independence with ambulation and ADLs prior to admission. 1 fall within the past year when she tripped on a rug. ADLs Comments: has help for heavy cleaning monthly    OT Problem List: Impaired balance (sitting and/or standing);Pain   OT Treatment/Interventions:        OT Goals(Current goals can be found in the care plan section)       OT Frequency:       Co-evaluation              AM-PAC OT "6 Clicks" Daily Activity     Outcome Measure Help from another person eating meals?: None Help from another person taking care of personal grooming?: None Help from another person toileting, which includes using toliet, bedpan, or urinal?: None Help from another person bathing (including washing, rinsing, drying)?: None Help from another person to put on and taking off regular upper body clothing?: None Help from another person to put on and taking off regular lower body clothing?: None 6 Click Score: 24   End of Session Equipment Utilized During Treatment: Rolling walker (2 wheels);Gait belt Nurse Communication: Mobility status  Activity Tolerance: Patient limited by pain Patient left: in bed;with call bell/phone within reach  OT Visit Diagnosis: Unsteadiness on feet (R26.81);Other abnormalities of gait and mobility (R26.89);Pain                Time: 1443-1531 OT Time Calculation (min): 48 min Charges:  OT General Charges $OT Visit: 1 Visit OT Evaluation $OT Eval Low Complexity: 1 Low OT Treatments $Self Care/Home Management : 23-37 mins  Avanell Leigh, OTR/L Acute Rehabilitation Services Office:  [phone]   Jonette Nestle 08/05/2023, 3:52 PM

## 2023-08-05 NOTE — Progress Notes (Signed)
 Rounding Note    Patient Name: Elizabeth [name] Date of Encounter: 08/05/2023  [facility] Cardiologist: Dr Sandee Crook  Subjective   No CP or dyspnea  Inpatient Medications    Scheduled Meds:  aspirin  EC  81 mg Oral Daily   diltiazem  180 mg Oral Daily   enoxaparin  (LOVENOX ) injection  40 mg Subcutaneous Q24H   furosemide   20 mg Oral Daily   gabapentin   200 mg Oral Daily   LORazepam   1 mg Oral See admin instructions   polyethylene glycol  17 g Oral BID   Continuous Infusions:  PRN Meds: acetaminophen  **OR** acetaminophen , docusate sodium , metoprolol  tartrate, ondansetron  (ZOFRAN ) IV, oxyCODONE    Vital Signs    Vitals:   08/04/23 2300 08/05/23 0021 08/05/23 0200 08/05/23 0444  BP: 109/67 136/65  111/67  Pulse: (!) 115 (!) 117 (!) 113 (!) 115  Resp: 18 19 19 20   Temp:  97.8 F (36.6 C)  98 F (36.7 C)  TempSrc:  Oral  Oral  SpO2: 97% 96% 96% 99%  Weight:    106.8 kg  Height:        Intake/Output Summary (Last 24 hours) at 08/05/2023 0727 Last data filed at 08/05/2023 0445 Gross per 24 hour  Intake --  Output 100 ml  Net -100 ml      08/05/2023    4:44 AM 08/04/2023    6:10 PM 08/04/2023    2:36 PM  Last 3 Weights  Weight (lbs) 235 lb 7.2 oz 235 lb 235 lb  Weight (kg) 106.8 kg 106.595 kg 106.595 kg      Telemetry    Sinus with episodes of atrial tachycardia.- Personally Reviewed  Physical Exam   GEN: No acute distress.   Neck: No JVD Cardiac: Tachycardic and regular Respiratory: Clear to auscultation bilaterally. GI: Soft, nontender, non-distended  MS: No edema Neuro:  Nonfocal  Psych: Normal affect   Labs    High Sensitivity Troponin:   Recent Labs  Lab 08/04/23 1819 08/04/23 2011  TROPONINIHS 8 9     Chemistry Recent Labs  Lab 08/04/23 1819 08/04/23 2011 08/05/23 0057  NA 144  --  145  K 3.8  --  3.6  CL 109  --  111  CO2 25  --  23  GLUCOSE 103*  --  96  BUN 18  --  19  CREATININE 1.44*  --  1.34*  CALCIUM 9.3   --  9.1  MG  --  2.0  --   PROT  --   --  5.9*  ALBUMIN  --   --  3.3*  AST  --   --  18  ALT  --   --  11  ALKPHOS  --   --  51  BILITOT  --   --  0.7  GFRNONAA 37*  --  41*  ANIONGAP 10  --  11     Hematology Recent Labs  Lab 08/04/23 1819  WBC 8.1  RBC 3.63*  HGB 11.0*  HCT 34.4*  MCV 94.8  MCH 30.3  MCHC 32.0  RDW 13.2  PLT 178   Thyroid   Recent Labs  Lab 08/05/23 0057  TSH 3.684    BNP Recent Labs  Lab 08/04/23 1819  BNP 37.4      Radiology    CT Angio Chest PE W/Cm &/Or Wo Cm Result Date: 08/04/2023 CLINICAL DATA:  Tachycardia chest pain EXAM: CT ANGIOGRAPHY CHEST WITH CONTRAST TECHNIQUE: Multidetector CT imaging  of the chest was performed using the standard protocol during bolus administration of intravenous contrast. Multiplanar CT image reconstructions and MIPs were obtained to evaluate the vascular anatomy. RADIATION DOSE REDUCTION: This exam was performed according to the departmental dose-optimization program which includes automated exposure control, adjustment of the mA and/or kV according to patient size and/or use of iterative reconstruction technique. CONTRAST:  75mL OMNIPAQUE  IOHEXOL  350 MG/ML SOLN COMPARISON:  Chest x-ray 08/04/2023, CT 01/08/2022, 06/03/2023 FINDINGS: Cardiovascular: Satisfactory opacification of the pulmonary arteries to the segmental level. No evidence of pulmonary embolism. Limited assessment for distal PE in the lower lobes secondary to motion. Status post TAVR. Aortic atherosclerosis. Coronary vascular calcification. Mild cardiomegaly. No pericardial effusion Mediastinum/Nodes: Patent trachea. No suspicious thyroid  mass. No suspicious lymph nodes. Esophagus within normal limits Lungs/Pleura: No acute airspace disease, pleural effusion or pneumothorax. Stable scattered small pulmonary nodules, largest is in the lateral left lower lobe and measures 5 mm on series 6, image 94, no specific imaging follow-up is recommended Upper Abdomen:  No acute finding. Right kidney stone measuring 9 mm incompletely visualized. Musculoskeletal: No acute or suspicious osseous abnormality. Review of the MIP images confirms the above findings. IMPRESSION: 1. Negative for acute pulmonary embolus. Limited assessment for distal PE in the lower lobes secondary to motion. 2. Mild cardiomegaly. Status post TAVR. 3. Aortic atherosclerosis. Aortic Atherosclerosis (ICD10-I70.0). Electronically Signed   By: Esmeralda Hedge M.D.   On: 08/04/2023 20:45   DG Chest 1 View Result Date: 08/04/2023 CLINICAL DATA:  Tachycardia.  Chest pain. EXAM: CHEST  1 VIEW COMPARISON:  07/12/2023 FINDINGS: The patient is rotated. Borderline cardiomegaly is stable. TAVR. Unchanged mediastinal contours allowing for rotation. No focal airspace disease, pulmonary edema, pleural effusion or pneumothorax. On limited assessment, no acute osseous findings. IMPRESSION: Low lung volumes without acute findings. Electronically Signed   By: Chadwick Colonel M.D.   On: 08/04/2023 19:11    Patient Profile     78 y.o. female with past medical history of TAVR, chronic diastolic congestive heart failure being evaluated for tachycardia and acute on chronic diastolic congestive heart failure.  Last echocardiogram May 2024 showed normal LV function, mild left atrial enlargement, mild to moderate tricuspid regurgitation, status post TAVR with mild perivalvular leak.  Assessment & Plan    1 atrial tachycardia-I have reviewed the patient with Dr. Carolynne Citron.  He has reviewed the ECG as well.  This appears to be atrial tachycardia and not flutter.  We will therefore not anticoagulate.  Given that she has a history of normal LV function and minimal coronary artery disease Dr. Carolynne Citron felt we could resume flecainide .  Will discontinue Cardizem and treat with metoprolol  25 mg twice daily.  Resume flecainide  50 mg twice daily.  Hopefully she will convert and maintain sinus with this approach.  If she tolerates flecainide   will arrange outpatient exercise treadmill following discharge to rule out exercise induced ventricular tachycardia.  However she did tolerate flecainide  in the past.  2 acute on chronic diastolic congestive heart failure-patient is mildly volume overloaded.  Continue present dose of Lasix  and follow renal function.  3 hypertension-follow blood pressure and adjust medicines as needed.  4 status post TAVR  For questions or updates, please contact [facility] Please consult [email or web address] for contact info under        Signed, Alexandria Angel, MD  08/05/2023, 7:27 AM

## 2023-08-06 DIAGNOSIS — I5032 Chronic diastolic (congestive) heart failure: Secondary | ICD-10-CM

## 2023-08-06 DIAGNOSIS — I5033 Acute on chronic diastolic (congestive) heart failure: Secondary | ICD-10-CM

## 2023-08-06 DIAGNOSIS — I4719 Other supraventricular tachycardia: Secondary | ICD-10-CM

## 2023-08-06 LAB — BASIC METABOLIC PANEL WITH GFR
Anion gap: 9 (ref 5–15)
BUN: 18 mg/dL (ref 8–23)
CO2: 23 mmol/L (ref 22–32)
Calcium: 8.4 mg/dL — ABNORMAL LOW (ref 8.9–10.3)
Chloride: 107 mmol/L (ref 98–111)
Creatinine, Ser: 1.73 mg/dL — ABNORMAL HIGH (ref 0.44–1.00)
GFR, Estimated: 30 mL/min — ABNORMAL LOW (ref >60)
Glucose, Bld: 104 mg/dL — ABNORMAL HIGH (ref 70–99)
Potassium: 4.4 mmol/L (ref 3.5–5.1)
Sodium: 139 mmol/L (ref 135–145)

## 2023-08-06 MED ORDER — FUROSEMIDE 40 MG PO TABS
40.0000 mg | ORAL_TABLET | Freq: Every day | ORAL | Status: DC
  Administered 2023-08-07: 40 mg via ORAL
  Filled 2023-08-06: qty 1

## 2023-08-06 MED ORDER — FUROSEMIDE 10 MG/ML IJ SOLN
20.0000 mg | Freq: Once | INTRAMUSCULAR | Status: AC
  Administered 2023-08-06: 20 mg via INTRAVENOUS
  Filled 2023-08-06: qty 2

## 2023-08-06 NOTE — Progress Notes (Signed)
 Rounding Note    Patient Name: Elizabeth [name] Date of Encounter: 08/06/2023  [facility] Cardiologist: Dr Sandee Crook  Subjective   Pt denies CP or dyspnea (complains of bilateral lower extremity pain/swelling)  Inpatient Medications    Scheduled Meds:  aspirin  EC  81 mg Oral Daily   enoxaparin  (LOVENOX ) injection  40 mg Subcutaneous Q24H   flecainide   50 mg Oral BID   furosemide   20 mg Oral Daily   gabapentin   200 mg Oral Daily   metoprolol  tartrate  25 mg Oral BID   polyethylene glycol  17 g Oral BID   Continuous Infusions:  PRN Meds: acetaminophen  **OR** acetaminophen , docusate sodium , metoprolol  tartrate, ondansetron  (ZOFRAN ) IV, oxyCODONE    Vital Signs    Vitals:   08/05/23 1950 08/06/23 0107 08/06/23 0532 08/06/23 0722  BP: (!) 105/54 122/61 (!) 104/57 (!) 118/53  Pulse: 97 (!) 103 (!) 51 96  Resp: (!) 21 20 20  (!) 21  Temp: 98.4 F (36.9 C) 98.5 F (36.9 C) 99.3 F (37.4 C) 98.6 F (37 C)  TempSrc: Oral Oral Oral Oral  SpO2: 91% 97% 91% 94%  Weight:   107.3 kg   Height:        Intake/Output Summary (Last 24 hours) at 08/06/2023 0941 Last data filed at 08/06/2023 0539 Gross per 24 hour  Intake 240 ml  Output 900 ml  Net -660 ml      08/06/2023    5:32 AM 08/05/2023    4:44 AM 08/04/2023    6:10 PM  Last 3 Weights  Weight (lbs) 236 lb 8 oz 235 lb 7.2 oz 235 lb  Weight (kg) 107.276 kg 106.8 kg 106.595 kg      Telemetry    Sinus with episodes of atrial tachycardia much less frequent- Personally Reviewed  Physical Exam   GEN: NAD Neck: supple Cardiac: RRR Respiratory: CTA GI: Soft, NT/ND MS: trace edema Neuro:  Grossly intact Psych: Normal affect   Labs    High Sensitivity Troponin:   Recent Labs  Lab 08/04/23 1819 08/04/23 2011  TROPONINIHS 8 9     Chemistry Recent Labs  Lab 08/04/23 1819 08/04/23 2011 08/05/23 0057  NA 144  --  145  K 3.8  --  3.6  CL 109  --  111  CO2 25  --  23  GLUCOSE 103*  --  96  BUN  18  --  19  CREATININE 1.44*  --  1.34*  CALCIUM 9.3  --  9.1  MG  --  2.0  --   PROT  --   --  5.9*  ALBUMIN  --   --  3.3*  AST  --   --  18  ALT  --   --  11  ALKPHOS  --   --  51  BILITOT  --   --  0.7  GFRNONAA 37*  --  41*  ANIONGAP 10  --  11     Hematology Recent Labs  Lab 08/04/23 1819  WBC 8.1  RBC 3.63*  HGB 11.0*  HCT 34.4*  MCV 94.8  MCH 30.3  MCHC 32.0  RDW 13.2  PLT 178   Thyroid   Recent Labs  Lab 08/05/23 0057  TSH 3.684    BNP Recent Labs  Lab 08/04/23 1819  BNP 37.4      Radiology    VAS US  LOWER EXTREMITY VENOUS (DVT) (7a-7p) Result Date: 08/05/2023  Lower Venous DVT Study Patient Name:  Taylor Favia Peary  Date of Exam:   08/05/2023 Medical Rec #: [id number]             Accession #:    [id number] Date of Birth: 11-Oct-1945             Patient Gender: F Patient Age:   20 years Exam Location:  [facility] Procedure:      VAS US  LOWER EXTREMITY VENOUS (DVT) Referring Phys: Corky Diener RATHORE --------------------------------------------------------------------------------  Indications: Pain.  Limitations: Poor ultrasound/tissue interface. Comparison Study: Prior negative bilateral LEV done 01/10/22 Performing Technologist: Carleene Chase RVS  Examination Guidelines: A complete evaluation includes B-mode imaging, spectral Doppler, color Doppler, and power Doppler as needed of all accessible portions of each vessel. Bilateral testing is considered an integral part of a complete examination. Limited examinations for reoccurring indications may be performed as noted. The reflux portion of the exam is performed with the patient in reverse Trendelenburg.  +---------+---------------+---------+-----------+----------+-------------------+ RIGHT    CompressibilityPhasicitySpontaneityPropertiesThrombus Aging      +---------+---------------+---------+-----------+----------+-------------------+ CFV      Full           Yes      Yes                                       +---------+---------------+---------+-----------+----------+-------------------+ SFJ      Full                                                             +---------+---------------+---------+-----------+----------+-------------------+ FV Prox  Full           Yes      Yes                                      +---------+---------------+---------+-----------+----------+-------------------+ FV Mid   Full                                                             +---------+---------------+---------+-----------+----------+-------------------+ FV DistalFull           Yes      Yes                                      +---------+---------------+---------+-----------+----------+-------------------+ PFV      Full                                                             +---------+---------------+---------+-----------+----------+-------------------+ POP      Full           Yes      Yes                                      +---------+---------------+---------+-----------+----------+-------------------+  PTV      Full                                                             +---------+---------------+---------+-----------+----------+-------------------+ PERO                                                  Not well visualized +---------+---------------+---------+-----------+----------+-------------------+ Gastroc  Full                                                             +---------+---------------+---------+-----------+----------+-------------------+   +----+---------------+---------+-----------+----------+--------------+ LEFTCompressibilityPhasicitySpontaneityPropertiesThrombus Aging +----+---------------+---------+-----------+----------+--------------+ CFV Full           Yes      Yes                                 +----+---------------+---------+-----------+----------+--------------+ SFJ Full                                                         +----+---------------+---------+-----------+----------+--------------+    Summary: RIGHT: - There is no evidence of deep vein thrombosis in the lower extremity.  - A cystic structure is found in the popliteal fossa measuring 2.1 x 2.78 cm.  LEFT: - No evidence of common femoral vein obstruction.   *See table(s) above for measurements and observations.    Preliminary    ECHOCARDIOGRAM COMPLETE Result Date: 08/05/2023    ECHOCARDIOGRAM REPORT   Patient Name:   KATALINA MAGRI Wass Date of Exam: 08/05/2023 Medical Rec #:  [id number]            Height:       65.0 in Accession #:    [id number]           Weight:       235.4 lb Date of Birth:  06/26/1945            BSA:          2.120 m Patient Age:    77 years             BP:           146/66 mmHg Patient Gender: F                    HR:           96 bpm. Exam Location:  Inpatient Procedure: 2D Echo, Cardiac Doppler and Color Doppler (Both Spectral and Color            Flow Doppler were utilized during procedure). Indications:    Post TAVR evaluation Z95.2  History:        Patient has prior history of Echocardiogram examinations, most  recent 08/07/2022. Aortic Valve Disease; Risk                 Factors:Hypertension.                 Aortic Valve: 29 mm Medtronic Evolut valve is present in the                 aortic position. Procedure Date: 05/28/19.  Sonographer:    Astrid Blamer Referring Phys: 62 Jamayia Croker S Ashritha Desrosiers IMPRESSIONS  1. Left ventricular ejection fraction, by estimation, is 55 to 60%. The left ventricle has normal function. The left ventricle has no regional wall motion abnormalities. The left ventricular internal cavity size was mildly dilated. Left ventricular diastolic parameters are indeterminate.  2. Right ventricular systolic function is normal. The right ventricular size is normal.  3. Left atrial size was moderately dilated.  4. The mitral valve is normal in structure. Trivial mitral valve regurgitation.  Mild mitral stenosis. Moderate mitral annular calcification.  5. The aortic valve has been repaired/replaced. Aortic valve regurgitation is trivial. No aortic stenosis is present. There is a 29 mm Medtronic Evolut valve present in the aortic position. Procedure Date: 05/28/19.  6. The inferior vena cava is normal in size with greater than 50% respiratory variability, suggesting right atrial pressure of 3 mmHg. FINDINGS  Left Ventricle: Left ventricular ejection fraction, by estimation, is 55 to 60%. The left ventricle has normal function. The left ventricle has no regional wall motion abnormalities. The left ventricular internal cavity size was mildly dilated. There is  no left ventricular hypertrophy. Left ventricular diastolic parameters are indeterminate. Right Ventricle: The right ventricular size is normal. Right ventricular systolic function is normal. Left Atrium: Left atrial size was moderately dilated. Right Atrium: Right atrial size was normal in size. Pericardium: There is no evidence of pericardial effusion. Mitral Valve: The mitral valve is normal in structure. Moderate mitral annular calcification. Trivial mitral valve regurgitation. Mild mitral valve stenosis. MV peak gradient, 10.3 mmHg. The mean mitral valve gradient is 5.0 mmHg. Tricuspid Valve: The tricuspid valve is normal in structure. Tricuspid valve regurgitation is not demonstrated. No evidence of tricuspid stenosis. Aortic Valve: The aortic valve has been repaired/replaced. Aortic valve regurgitation is trivial. No aortic stenosis is present. Aortic valve mean gradient measures 9.0 mmHg. Aortic valve peak gradient measures 13.7 mmHg. Aortic valve area, by VTI measures 1.98 cm. There is a 29 mm Medtronic Evolut valve present in the aortic position. Procedure Date: 05/28/19. Pulmonic Valve: The pulmonic valve was normal in structure. Pulmonic valve regurgitation is not visualized. No evidence of pulmonic stenosis. Aorta: The aortic root is  normal in size and structure. Venous: The inferior vena cava is normal in size with greater than 50% respiratory variability, suggesting right atrial pressure of 3 mmHg. IAS/Shunts: No atrial level shunt detected by color flow Doppler.  LEFT VENTRICLE PLAX 2D LVIDd:         5.40 cm LVIDs:         4.10 cm LV PW:         0.90 cm LV IVS:        1.00 cm LVOT diam:     1.80 cm LV SV:         70 LV SV Index:   33 LVOT Area:     2.54 cm  RIGHT VENTRICLE RV S prime:     12.20 cm/s TAPSE (M-mode): 2.2 cm LEFT ATRIUM  Index        RIGHT ATRIUM           Index LA Vol (A2C):   112.0 ml 52.83 ml/m  RA Area:     18.10 cm LA Vol (A4C):   68.7 ml  32.40 ml/m  RA Volume:   49.00 ml  23.11 ml/m LA Biplane Vol: 88.6 ml  41.79 ml/m  AORTIC VALVE AV Area (Vmax):    1.72 cm AV Area (Vmean):   1.71 cm AV Area (VTI):     1.98 cm AV Vmax:           185.00 cm/s AV Vmean:          142.000 cm/s AV VTI:            0.354 m AV Peak Grad:      13.7 mmHg AV Mean Grad:      9.0 mmHg LVOT Vmax:         125.00 cm/s LVOT Vmean:        95.700 cm/s LVOT VTI:          0.275 m LVOT/AV VTI ratio: 0.78 MITRAL VALVE MV Area (PHT): 4.52 cm     SHUNTS MV Area VTI:   2.38 cm     Systemic VTI:  0.28 m MV Peak grad:  10.3 mmHg    Systemic Diam: 1.80 cm MV Mean grad:  5.0 mmHg MV Vmax:       1.60 m/s MV Vmean:      102.5 cm/s MV Decel Time: 168 msec MV E velocity: 134.00 cm/s Alexandria Angel MD Electronically signed by Alexandria Angel MD Signature Date/Time: 08/05/2023/1:18:50 PM    Final    CT Angio Chest PE W/Cm &/Or Wo Cm Result Date: 08/04/2023 CLINICAL DATA:  Tachycardia chest pain EXAM: CT ANGIOGRAPHY CHEST WITH CONTRAST TECHNIQUE: Multidetector CT imaging of the chest was performed using the standard protocol during bolus administration of intravenous contrast. Multiplanar CT image reconstructions and MIPs were obtained to evaluate the vascular anatomy. RADIATION DOSE REDUCTION: This exam was performed according to the departmental  dose-optimization program which includes automated exposure control, adjustment of the mA and/or kV according to patient size and/or use of iterative reconstruction technique. CONTRAST:  75mL OMNIPAQUE  IOHEXOL  350 MG/ML SOLN COMPARISON:  Chest x-ray 08/04/2023, CT 01/08/2022, 06/03/2023 FINDINGS: Cardiovascular: Satisfactory opacification of the pulmonary arteries to the segmental level. No evidence of pulmonary embolism. Limited assessment for distal PE in the lower lobes secondary to motion. Status post TAVR. Aortic atherosclerosis. Coronary vascular calcification. Mild cardiomegaly. No pericardial effusion Mediastinum/Nodes: Patent trachea. No suspicious thyroid  mass. No suspicious lymph nodes. Esophagus within normal limits Lungs/Pleura: No acute airspace disease, pleural effusion or pneumothorax. Stable scattered small pulmonary nodules, largest is in the lateral left lower lobe and measures 5 mm on series 6, image 94, no specific imaging follow-up is recommended Upper Abdomen: No acute finding. Right kidney stone measuring 9 mm incompletely visualized. Musculoskeletal: No acute or suspicious osseous abnormality. Review of the MIP images confirms the above findings. IMPRESSION: 1. Negative for acute pulmonary embolus. Limited assessment for distal PE in the lower lobes secondary to motion. 2. Mild cardiomegaly. Status post TAVR. 3. Aortic atherosclerosis. Aortic Atherosclerosis (ICD10-I70.0). Electronically Signed   By: Esmeralda Hedge M.D.   On: 08/04/2023 20:45   DG Chest 1 View Result Date: 08/04/2023 CLINICAL DATA:  Tachycardia.  Chest pain. EXAM: CHEST  1 VIEW COMPARISON:  07/12/2023 FINDINGS: The patient is rotated. Borderline cardiomegaly is stable. TAVR.  Unchanged mediastinal contours allowing for rotation. No focal airspace disease, pulmonary edema, pleural effusion or pneumothorax. On limited assessment, no acute osseous findings. IMPRESSION: Low lung volumes without acute findings. Electronically  Signed   By: Chadwick Colonel M.D.   On: 08/04/2023 19:11    Patient Profile     78 y.o. female with past medical history of TAVR, chronic diastolic congestive heart failure being evaluated for tachycardia and acute on chronic diastolic congestive heart failure.  Follow-up echocardiogram this admission shows normal LV function, mild left ventricular enlargement, moderate left atrial enlargement, mild mitral stenosis, status post TAVR with trace aortic insufficiency.  Assessment & Plan    1 atrial tachycardia-As outlined previously; I reviewed the patient with Dr. Carolynne Citron.  He has reviewed the ECG as well.  Patient appears to have atrial tachycardia and not atrial flutter.  We have therefore not anticoagulated.  She has a history of normal LV function and minimal coronary artery disease and therefore Dr. Carolynne Citron recommended resuming flecainide  (despite history of TAVR) which has improved her atrial tachycardia.  Continue Toprol .  Note follow-up echocardiogram again shows preserved LV function.  Will arrange outpatient exercise treadmill in 2 to 4 weeks to exclude exercise-induced ventricular tachycardia.    2 acute on chronic diastolic congestive heart failure-patient remains mildly volume overloaded today.  Will give Lasix  40 mg x 1.  Recheck potassium and renal function tomorrow morning.  3 hypertension-follow blood pressure and adjust medicines as needed.  4 status post TAVR  Can likely be discharged tomorrow morning.  For questions or updates, please contact [facility] Please consult [email or web address] for contact info under        Signed, Alexandria Angel, MD  08/06/2023, 9:41 AM

## 2023-08-06 NOTE — Progress Notes (Signed)
 [facility] PROGRESS NOTE    Progress Note  Elizabeth [name]  WUJ:[id number] DOB: 16-May-1945 DOA: 08/04/2023 PCP: Lonie Roa, MD     Brief Narrative:   Elizabeth [name] is an 78 y.o. female past medical history significant for ectopic atrial tachycardia with intermittent bradycardia, severe symptomatic aortic stenosis status post TAVR's in March 2021, nonobstructive coronary artery disease, chronic HFpEF, essential hypertension, chronic kidney disease stage IIIA,  history of GI bleed seen at the cardiologist office for evaluation of chest pain and tachycardia, shortness of breath and bilateral lower extremity edema.  In the ED was found to have a heart rate of 130 twelve-lead EKG showed atrial tachycardia with multiple PVCs hemoglobin of 11 potassium of 3.8 mag of 2.0 creatinine at baseline, cardiac biomarkers are negative, CT angio of the chest was negative for PE   Assessment/Plan:   Chest pain shortness of breath likely due to Atrial tachycardia (HCC) Likely due to flecainide  which was stopped a month ago.  Has a setting of prior TAVR's flecainide  was no longer needed, 2D echo showed preserved EF. Now on oral diltiazem and flecainide  appreciate cardiology assistance. Further management per cardiology. She has weaned down to room air.  Chronic HFpEF: Last 2D echo in 2024 showed an EF of 55%, CT angio showed no PE. Lower extremity Dopplers showed no evidence of DVT. Restarted home dose of Lasix .  Chronic kidney disease stage IIIa: Appears to be at baseline, restart her home dose of Lasix . Hold ACE inhibitor. Pressures controlled.  Essential hypertension: Resume Lasix , currently on diltiazem.  GERD: Continue PPI.  Chronic pain/peripheral neuropathy: Continue Neurontin .   DVT prophylaxis: lovenox  Family Communication:none Status is: Observation The patient remains OBS appropriate and will d/c before 2 midnights.    Code Status:     Code Status Orders   (From admission, onward)           Start     Ordered   08/05/23 0032  Do not attempt resuscitation (DNR) Pre-Arrest Interventions Desired  Continuous       Question Answer Comment  If pulseless and not breathing No CPR or chest compressions.   In Pre-Arrest Conditions (Patient Has Pulse and Is Breathing) May intubate, use advanced airway interventions and cardioversion/ACLS medications if appropriate or indicated. May transfer to ICU.   Consent: Discussion documented in EHR or advanced directives reviewed      08/05/23 0033           Code Status History     Date Active Date Inactive Code Status Order ID Comments User Context   08/05/2022 1222 08/08/2022 1805 DNR [id number]  Vita Grip, MD ED   01/09/2022 0256 01/12/2022 1838 Full Code [id number]  Kinsinger, Alphonso Aschoff, MD ED   05/28/2019 1955 05/29/2019 2029 Full Code [id number]  Ardia Kraft, PA-C Inpatient   03/11/2019 0951 03/11/2019 1800 Full Code [id number]  Arnoldo Lapping, MD Inpatient   06/12/2017 1057 06/13/2017 1300 Full Code [id number]  Garry Kansas, MD Inpatient         IV Access:   Peripheral IV   Procedures and diagnostic studies:   VAS US  LOWER EXTREMITY VENOUS (DVT) (7a-7p) Result Date: 08/05/2023  Lower Venous DVT Study Patient Name:  Elizabeth [name]  Date of Exam:   08/05/2023 Medical Rec #: [id number]             Accession #:    [id number] Date of Birth: 1945/09/08  Patient Gender: F Patient Age:   76 years Exam Location:  [facility] Procedure:      VAS US  LOWER EXTREMITY VENOUS (DVT) Referring Phys: Corky Diener RATHORE --------------------------------------------------------------------------------  Indications: Pain.  Limitations: Poor ultrasound/tissue interface. Comparison Study: Prior negative bilateral LEV done 01/10/22 Performing Technologist: Carleene Chase RVS  Examination Guidelines: A complete evaluation includes B-mode imaging, spectral Doppler, color Doppler, and  power Doppler as needed of all accessible portions of each vessel. Bilateral testing is considered an integral part of a complete examination. Limited examinations for reoccurring indications may be performed as noted. The reflux portion of the exam is performed with the patient in reverse Trendelenburg.  +---------+---------------+---------+-----------+----------+-------------------+ RIGHT    CompressibilityPhasicitySpontaneityPropertiesThrombus Aging      +---------+---------------+---------+-----------+----------+-------------------+ CFV      Full           Yes      Yes                                      +---------+---------------+---------+-----------+----------+-------------------+ SFJ      Full                                                             +---------+---------------+---------+-----------+----------+-------------------+ FV Prox  Full           Yes      Yes                                      +---------+---------------+---------+-----------+----------+-------------------+ FV Mid   Full                                                             +---------+---------------+---------+-----------+----------+-------------------+ FV DistalFull           Yes      Yes                                      +---------+---------------+---------+-----------+----------+-------------------+ PFV      Full                                                             +---------+---------------+---------+-----------+----------+-------------------+ POP      Full           Yes      Yes                                      +---------+---------------+---------+-----------+----------+-------------------+ PTV      Full                                                             +---------+---------------+---------+-----------+----------+-------------------+  PERO                                                  Not well visualized  +---------+---------------+---------+-----------+----------+-------------------+ Gastroc  Full                                                             +---------+---------------+---------+-----------+----------+-------------------+   +----+---------------+---------+-----------+----------+--------------+ LEFTCompressibilityPhasicitySpontaneityPropertiesThrombus Aging +----+---------------+---------+-----------+----------+--------------+ CFV Full           Yes      Yes                                 +----+---------------+---------+-----------+----------+--------------+ SFJ Full                                                        +----+---------------+---------+-----------+----------+--------------+    Summary: RIGHT: - There is no evidence of deep vein thrombosis in the lower extremity.  - A cystic structure is found in the popliteal fossa measuring 2.1 x 2.78 cm.  LEFT: - No evidence of common femoral vein obstruction.   *See table(s) above for measurements and observations.    Preliminary    ECHOCARDIOGRAM COMPLETE Result Date: 08/05/2023    ECHOCARDIOGRAM REPORT   Patient Name:   Elizabeth [name] Goodnough Date of Exam: 08/05/2023 Medical Rec #:  [id number]            Height:       65.0 in Accession #:    [id number]           Weight:       235.4 lb Date of Birth:  [birth year]            BSA:          2.120 m Patient Age:    77 years             BP:           146/66 mmHg Patient Gender: F                    HR:           96 bpm. Exam Location:  Inpatient Procedure: 2D Echo, Cardiac Doppler and Color Doppler (Both Spectral and Color            Flow Doppler were utilized during procedure). Indications:    Post TAVR evaluation Z95.2  History:        Patient has prior history of Echocardiogram examinations, most                 recent 08/07/2022. Aortic Valve Disease; Risk                 Factors:Hypertension.                 Aortic Valve: 29 mm Medtronic Evolut valve is present in the  aortic position. Procedure Date: 05/28/19.  Sonographer:    Astrid Blamer Referring Phys: [id number] BRIAN S CRENSHAW IMPRESSIONS  1. Left ventricular ejection fraction, by estimation, is 55 to 60%. The left ventricle has normal function. The left ventricle has no regional wall motion abnormalities. The left ventricular internal cavity size was mildly dilated. Left ventricular diastolic parameters are indeterminate.  2. Right ventricular systolic function is normal. The right ventricular size is normal.  3. Left atrial size was moderately dilated.  4. The mitral valve is normal in structure. Trivial mitral valve regurgitation. Mild mitral stenosis. Moderate mitral annular calcification.  5. The aortic valve has been repaired/replaced. Aortic valve regurgitation is trivial. No aortic stenosis is present. There is a 29 mm Medtronic Evolut valve present in the aortic position. Procedure Date: 05/28/19.  6. The inferior vena cava is normal in size with greater than 50% respiratory variability, suggesting right atrial pressure of 3 mmHg. FINDINGS  Left Ventricle: Left ventricular ejection fraction, by estimation, is 55 to 60%. The left ventricle has normal function. The left ventricle has no regional wall motion abnormalities. The left ventricular internal cavity size was mildly dilated. There is  no left ventricular hypertrophy. Left ventricular diastolic parameters are indeterminate. Right Ventricle: The right ventricular size is normal. Right ventricular systolic function is normal. Left Atrium: Left atrial size was moderately dilated. Right Atrium: Right atrial size was normal in size. Pericardium: There is no evidence of pericardial effusion. Mitral Valve: The mitral valve is normal in structure. Moderate mitral annular calcification. Trivial mitral valve regurgitation. Mild mitral valve stenosis. MV peak gradient, 10.3 mmHg. The mean mitral valve gradient is 5.0 mmHg. Tricuspid Valve: The tricuspid valve is normal  in structure. Tricuspid valve regurgitation is not demonstrated. No evidence of tricuspid stenosis. Aortic Valve: The aortic valve has been repaired/replaced. Aortic valve regurgitation is trivial. No aortic stenosis is present. Aortic valve mean gradient measures 9.0 mmHg. Aortic valve peak gradient measures 13.7 mmHg. Aortic valve area, by VTI measures 1.98 cm. There is a 29 mm Medtronic Evolut valve present in the aortic position. Procedure Date: 05/28/19. Pulmonic Valve: The pulmonic valve was normal in structure. Pulmonic valve regurgitation is not visualized. No evidence of pulmonic stenosis. Aorta: The aortic root is normal in size and structure. Venous: The inferior vena cava is normal in size with greater than 50% respiratory variability, suggesting right atrial pressure of 3 mmHg. IAS/Shunts: No atrial level shunt detected by color flow Doppler.  LEFT VENTRICLE PLAX 2D LVIDd:         5.40 cm LVIDs:         4.10 cm LV PW:         0.90 cm LV IVS:        1.00 cm LVOT diam:     1.80 cm LV SV:         70 LV SV Index:   33 LVOT Area:     2.54 cm  RIGHT VENTRICLE RV S prime:     12.20 cm/s TAPSE (M-mode): 2.2 cm LEFT ATRIUM              Index        RIGHT ATRIUM           Index LA Vol (A2C):   112.0 ml 52.83 ml/m  RA Area:     18.10 cm LA Vol (A4C):   68.7 ml  32.40 ml/m  RA Volume:   49.00 ml  23.11 ml/m LA Biplane Vol: 88.6  ml  41.79 ml/m  AORTIC VALVE AV Area (Vmax):    1.72 cm AV Area (Vmean):   1.71 cm AV Area (VTI):     1.98 cm AV Vmax:           185.00 cm/s AV Vmean:          142.000 cm/s AV VTI:            0.354 m AV Peak Grad:      13.7 mmHg AV Mean Grad:      9.0 mmHg LVOT Vmax:         125.00 cm/s LVOT Vmean:        95.700 cm/s LVOT VTI:          0.275 m LVOT/AV VTI ratio: 0.78 MITRAL VALVE MV Area (PHT): 4.52 cm     SHUNTS MV Area VTI:   2.38 cm     Systemic VTI:  0.28 m MV Peak grad:  10.3 mmHg    Systemic Diam: 1.80 cm MV Mean grad:  5.0 mmHg MV Vmax:       1.60 m/s MV Vmean:      102.5  cm/s MV Decel Time: 168 msec MV E velocity: 134.00 cm/s Alexandria Angel MD Electronically signed by Alexandria Angel MD Signature Date/Time: 08/05/2023/1:18:50 PM    Final    CT Angio Chest PE W/Cm &/Or Wo Cm Result Date: 08/04/2023 CLINICAL DATA:  Tachycardia chest pain EXAM: CT ANGIOGRAPHY CHEST WITH CONTRAST TECHNIQUE: Multidetector CT imaging of the chest was performed using the standard protocol during bolus administration of intravenous contrast. Multiplanar CT image reconstructions and MIPs were obtained to evaluate the vascular anatomy. RADIATION DOSE REDUCTION: This exam was performed according to the departmental dose-optimization program which includes automated exposure control, adjustment of the mA and/or kV according to patient size and/or use of iterative reconstruction technique. CONTRAST:  75mL OMNIPAQUE  IOHEXOL  350 MG/ML SOLN COMPARISON:  Chest x-ray 08/04/2023, CT 01/08/2022, 06/03/2023 FINDINGS: Cardiovascular: Satisfactory opacification of the pulmonary arteries to the segmental level. No evidence of pulmonary embolism. Limited assessment for distal PE in the lower lobes secondary to motion. Status post TAVR. Aortic atherosclerosis. Coronary vascular calcification. Mild cardiomegaly. No pericardial effusion Mediastinum/Nodes: Patent trachea. No suspicious thyroid  mass. No suspicious lymph nodes. Esophagus within normal limits Lungs/Pleura: No acute airspace disease, pleural effusion or pneumothorax. Stable scattered small pulmonary nodules, largest is in the lateral left lower lobe and measures 5 mm on series 6, image 94, no specific imaging follow-up is recommended Upper Abdomen: No acute finding. Right kidney stone measuring 9 mm incompletely visualized. Musculoskeletal: No acute or suspicious osseous abnormality. Review of the MIP images confirms the above findings. IMPRESSION: 1. Negative for acute pulmonary embolus. Limited assessment for distal PE in the lower lobes secondary to motion. 2.  Mild cardiomegaly. Status post TAVR. 3. Aortic atherosclerosis. Aortic Atherosclerosis (ICD10-I70.0). Electronically Signed   By: Esmeralda Hedge M.D.   On: 08/04/2023 20:45   DG Chest 1 View Result Date: 08/04/2023 CLINICAL DATA:  Tachycardia.  Chest pain. EXAM: CHEST  1 VIEW COMPARISON:  07/12/2023 FINDINGS: The patient is rotated. Borderline cardiomegaly is stable. TAVR. Unchanged mediastinal contours allowing for rotation. No focal airspace disease, pulmonary edema, pleural effusion or pneumothorax. On limited assessment, no acute osseous findings. IMPRESSION: Low lung volumes without acute findings. Electronically Signed   By: Chadwick Colonel M.D.   On: 08/04/2023 19:11     Medical Consultants:   None.   Subjective:    [name]  related her breathing is better she is off the oxygen  Objective:    Vitals:   08/05/23 1950 08/06/23 0107 08/06/23 0532 08/06/23 0722  BP: (!) 105/54 122/61 (!) 104/57 (!) 118/53  Pulse: 97 (!) 103 (!) 51 96  Resp: (!) 21 20 20  (!) 21  Temp: 98.4 F (36.9 C) 98.5 F (36.9 C) 99.3 F (37.4 C) 98.6 F (37 C)  TempSrc: Oral Oral Oral Oral  SpO2: 91% 97% 91% 94%  Weight:   107.3 kg   Height:       SpO2: 94 %   Intake/Output Summary (Last 24 hours) at 08/06/2023 0921 Last data filed at 08/06/2023 0539 Gross per 24 hour  Intake 240 ml  Output 900 ml  Net -660 ml   Filed Weights   08/04/23 1810 08/05/23 0444 08/06/23 0532  Weight: 106.6 kg 106.8 kg 107.3 kg    Exam: General exam: In no acute distress. Respiratory system: Good air movement and clear to auscultation. Cardiovascular system: S1 & S2 heard, RRR. No JVD. Gastrointestinal system: Abdomen is nondistended, soft and nontender.  Extremities: Trace lower extremity edema Skin: No rashes, lesions or ulcers Psychiatry: Judgement and insight appear normal. Mood & affect appropriate.  Data Reviewed:    Labs: Basic Metabolic Panel: Recent Labs  Lab 08/04/23 1819  08/04/23 2011 08/05/23 0057  NA 144  --  145  K 3.8  --  3.6  CL 109  --  111  CO2 25  --  23  GLUCOSE 103*  --  96  BUN 18  --  19  CREATININE 1.44*  --  1.34*  CALCIUM 9.3  --  9.1  MG  --  2.0  --    GFR Estimated Creatinine Clearance: 42.8 mL/min (A) (by C-G formula based on SCr of 1.34 mg/dL (H)). Liver Function Tests: Recent Labs  Lab 08/05/23 0057  AST 18  ALT 11  ALKPHOS 51  BILITOT 0.7  PROT 5.9*  ALBUMIN 3.3*   No results for input(s): "LIPASE", "AMYLASE" in the last 168 hours. No results for input(s): "AMMONIA" in the last 168 hours. Coagulation profile No results for input(s): "INR", "PROTIME" in the last 168 hours. COVID-19 Labs  No results for input(s): "DDIMER", "FERRITIN", "LDH", "CRP" in the last 72 hours.  Lab Results  Component Value Date   SARSCOV2NAA NEGATIVE 05/24/2019   SARSCOV2NAA Not Detected 05/15/2019   SARSCOV2NAA NEGATIVE 03/09/2019    CBC: Recent Labs  Lab 08/04/23 1819  WBC 8.1  NEUTROABS 5.1  HGB 11.0*  HCT 34.4*  MCV 94.8  PLT 178   Cardiac Enzymes: No results for input(s): "CKTOTAL", "CKMB", "CKMBINDEX", "TROPONINI" in the last 168 hours. BNP (last 3 results) No results for input(s): "PROBNP" in the last 8760 hours. CBG: No results for input(s): "GLUCAP" in the last 168 hours. D-Dimer: No results for input(s): "DDIMER" in the last 72 hours. Hgb A1c: No results for input(s): "HGBA1C" in the last 72 hours. Lipid Profile: No results for input(s): "CHOL", "HDL", "LDLCALC", "TRIG", "CHOLHDL", "LDLDIRECT" in the last 72 hours. Thyroid  function studies: Recent Labs    08/05/23 0057  TSH 3.684   Anemia work up: No results for input(s): "VITAMINB12", "FOLATE", "FERRITIN", "TIBC", "IRON", "RETICCTPCT" in the last 72 hours. Sepsis Labs: Recent Labs  Lab 08/04/23 1819 08/04/23 1824  WBC 8.1  --   LATICACIDVEN  --  1.6   Microbiology No results found for this or any previous visit (from the past 240  hours).  Medications:    aspirin  EC  81 mg Oral Daily   enoxaparin  (LOVENOX ) injection  40 mg Subcutaneous Q24H   flecainide   50 mg Oral BID   furosemide   20 mg Oral Daily   gabapentin   200 mg Oral Daily   metoprolol  tartrate  25 mg Oral BID   polyethylene glycol  17 g Oral BID   Continuous Infusions:    LOS: 1 day   Macdonald Savoy  [facility]  08/06/2023, 9:21 AM

## 2023-08-06 NOTE — Progress Notes (Signed)
 Patient is complaining about worsening bilateral ankle swelling.  Per chart patient has been admitted for atrial tachycardia and acute on chronic diastolic heart failure exacerbation.  She has been given IV Lasix  40 mg yesterday.  - in last 24 hours.  Given blood pressure is borderline soft unable to give high dose of diuretics and giving IV Lasix  20 mg tonight.  Will continue oral Lexis 40 mg from tomorrow as per cardiology recommendation. Repeat BMP to monitor for potassium level.   Chalonda Schlatter, MD [facility] 08/06/2023, 8:08 PM

## 2023-08-07 DIAGNOSIS — I5033 Acute on chronic diastolic (congestive) heart failure: Secondary | ICD-10-CM

## 2023-08-07 DIAGNOSIS — I5032 Chronic diastolic (congestive) heart failure: Secondary | ICD-10-CM | POA: Diagnosis not present

## 2023-08-07 DIAGNOSIS — I4719 Other supraventricular tachycardia: Secondary | ICD-10-CM | POA: Diagnosis not present

## 2023-08-07 LAB — BASIC METABOLIC PANEL WITH GFR
Anion gap: 4 — ABNORMAL LOW (ref 5–15)
BUN: 18 mg/dL (ref 8–23)
CO2: 29 mmol/L (ref 22–32)
Calcium: 8.4 mg/dL — ABNORMAL LOW (ref 8.9–10.3)
Chloride: 106 mmol/L (ref 98–111)
Creatinine, Ser: 1.85 mg/dL — ABNORMAL HIGH (ref 0.44–1.00)
GFR, Estimated: 28 mL/min — ABNORMAL LOW (ref >60)
Glucose, Bld: 102 mg/dL — ABNORMAL HIGH (ref 70–99)
Potassium: 4.7 mmol/L (ref 3.5–5.1)
Sodium: 139 mmol/L (ref 135–145)

## 2023-08-07 MED ORDER — LISINOPRIL 10 MG PO TABS: 10.0000 mg | ORAL_TABLET | Freq: Every day | ORAL | Status: DC

## 2023-08-07 MED ORDER — METOPROLOL SUCCINATE ER 25 MG PO TB24
12.5000 mg | ORAL_TABLET | Freq: Every day | ORAL | 11 refills | Status: AC
Start: 2023-08-07 — End: 2024-08-06

## 2023-08-07 MED ORDER — FLECAINIDE ACETATE 50 MG PO TABS: 50.0000 mg | ORAL_TABLET | Freq: Two times a day (BID) | ORAL | 1 refills | Status: DC

## 2023-08-07 NOTE — TOC Transition Note (Addendum)
 Transition of Care [facility]) - Discharge Note   Patient Details  Name: Elizabeth [name] MRN: [id number] Date of Birth: January 03, 1946  [facility] Care [facility]) CM/SW Contact:  Jennett Model, RN Phone Number: 08/07/2023, 8:54 AM   Clinical Narrative:    For dc today. She has transport home.  Awaiting to hear from [facility] for HHPT.  NCM made referral to [facility] with [facility], he is able to take referral.  Soc will begin 24 to 48 hrs post dc.           Patient Goals and CMS Choice            Discharge Placement                       Discharge Plan and Services Additional resources added to the After Visit Summary for                                       Social Drivers of Health (SDOH) Interventions SDOH Screenings   Food Insecurity: No Food Insecurity (08/05/2023)  Housing: Low Risk  (08/05/2023)  Transportation Needs: No Transportation Needs (08/05/2023)  Utilities: Not At Risk (08/05/2023)  Depression (PHQ2-9): Low Risk  (07/01/2020)  Social Connections: Unknown (08/05/2023)  Tobacco Use: Low Risk  (08/04/2023)     Readmission Risk Interventions    08/07/2023    8:51 AM  Readmission Risk Prevention Plan  Transportation Screening Complete  PCP or Specialist Appt within 5-7 Days Complete  Home Care Screening Complete  Medication Review (RN CM) Complete

## 2023-08-07 NOTE — Discharge Summary (Addendum)
 Physician Discharge Summary  Elizabeth [name] NFA:[id number] DOB: Oct 08, 1945 DOA: 08/04/2023  PCP: Lonie Roa, MD  Admit date: 08/04/2023 Discharge date: 08/07/2023  Admitted From: Home Disposition:  Home  Recommendations for Outpatient Follow-up:  Follow up with Cardiology in 1-2 weeks Please obtain BMP/CBC in one week   Home Health:Yes Equipment/Devices:None  Discharge Condition:Stable CODE STATUS:Full Diet recommendation: Heart Healthy  Brief/Interim Summary: 78 y.o. female past medical history significant for ectopic atrial tachycardia with intermittent bradycardia, severe symptomatic aortic stenosis status post TAVR's in March 2021, nonobstructive coronary artery disease, chronic HFpEF, essential hypertension, chronic kidney disease stage IIIA,  history of GI bleed seen at the cardiologist office for evaluation of chest pain and tachycardia, shortness of breath and bilateral lower extremity edema.  In the ED was found to have a heart rate of 130 twelve-lead EKG showed atrial tachycardia with multiple PVCs hemoglobin of 11 potassium of 3.8 mag of 2.0 creatinine at baseline, cardiac biomarkers are negative, CT angio of the chest was negative for PE   Discharge Diagnoses:  Principal Problem:   Atrial tachycardia (HCC) Active Problems:   HTN (hypertension)   Chronic heart failure with preserved ejection fraction (HFpEF) (HCC)   CKD (chronic kidney disease), stage III (HCC)   GERD (gastroesophageal reflux disease)   Acute on chronic diastolic congestive heart failure (HCC)  Chest pain and shortness of breath likely due to atrial tachycardia: She was taken off flecainide  several months ago in the setting of her recent tablets. CT angio of the chest was negative for PE 2D echo showed preserved EF. Cardiology was consulted she was started on metoprolol  and it was discussed with EP she was started back on her flecainide . We were able to wean him to room air. She continue  flecainide  and metoprolol  at home.  Chronic HFpEF: Lower extremity Doppler was negative for DVT She was seen on metoprolol  her Lasix  was held due to borderline blood pressure she will resume it as an outpatient when she sees cardiology.  Essential hypertension: Continue metoprolol  and Lasix .  GERD: Acute PPI.  Chronic pain/peripheral neuropathy:  Continue Neurontin .    Discharge Instructions  Discharge Instructions     Diet - low sodium heart healthy   Complete by: As directed    Increase activity slowly   Complete by: As directed       Allergies as of 08/07/2023       Reactions   Other Other (See Comments)   Strong pain meds = MUST HAVE AN ANTI-EMETIC ON BOARD TO TOLERATE THESE   Penicillins Hives, Other (See Comments)   Has patient had a PCN reaction causing immediate rash, facial/tongue/throat swelling, SOB or lightheadedness with hypotension:    #  #  YES  #  #  Has patient had a PCN reaction causing severe rash involving mucus membranes or skin necrosis:    #  #  YES  #  #    Atorvastatin Itching, Other (See Comments)   Redness and itching all over body   Codeine Nausea And Vomiting   MUST HAVE AN ANTI-EMETIC ON BOARD TO TOLERATE THIS.        Medication List     STOP taking these medications    clindamycin  300 MG capsule Commonly known as: CLEOCIN        TAKE these medications    aspirin  EC 81 MG tablet Take 81 mg by mouth daily. Swallow whole.   CALCIUM 1200+D3 PO Take 2 tablets by mouth daily.   docusate  sodium 100 MG capsule Commonly known as: COLACE Take 100 mg by mouth See admin instructions. Take 100 mg by mouth every other day as needed for constipation   flecainide  50 MG tablet Commonly known as: TAMBOCOR  Take 1 tablet (50 mg total) by mouth 2 (two) times daily.   furosemide  20 MG tablet Commonly known as: LASIX  TAKE 1 TABLET BY MOUTH DAILY   gabapentin  100 MG capsule Commonly known as: NEURONTIN  Take 200 mg by mouth in the  morning.   lisinopril  10 MG tablet Commonly known as: ZESTRIL  Take 1 tablet (10 mg total) by mouth daily. Start taking on: August 14, 2023 What changed: These instructions start on August 14, 2023. If you are unsure what to do until then, ask your doctor or other care provider.   LORazepam  1 MG tablet Commonly known as: ATIVAN  Take 1 mg by mouth See admin instructions. Take 1 mg by mouth prior to a dental visit   metoprolol  succinate 25 MG 24 hr tablet Commonly known as: Toprol  XL Take 0.5 tablets (12.5 mg total) by mouth daily. What changed: how much to take   oxyCODONE  5 MG immediate release tablet Commonly known as: Oxy IR/ROXICODONE  Take 5-10 mg by mouth every 4 (four) hours as needed (for pain).   pantoprazole  40 MG tablet Commonly known as: PROTONIX  Take 40 mg by mouth daily before breakfast.   tamoxifen  20 MG tablet Commonly known as: NOLVADEX  Take 1 tablet (20 mg total) by mouth daily.        Follow-up Information     Lonie Roa, MD. Call in 1 week(s).   Specialty: Family Medicine Why: please call to make hospital follow up Contact information: [address], [address] [place] [address] [phone]         [facility], [facility] Follow up.   Specialty: Home Health Services Why: Agency will call you to set up apt times Contact information: [address] [place] [address] [phone]                Allergies  Allergen Reactions   Other Other (See Comments)    Strong pain meds = MUST HAVE AN ANTI-EMETIC ON BOARD TO TOLERATE THESE   Penicillins Hives and Other (See Comments)    Has patient had a PCN reaction causing immediate rash, facial/tongue/throat swelling, SOB or lightheadedness with hypotension:    #  #  YES  #  #  Has patient had a PCN reaction causing severe rash involving mucus membranes or skin necrosis:    #  #  YES  #  #    Atorvastatin Itching and Other (See Comments)    Redness and itching all over body     Codeine Nausea And Vomiting    MUST HAVE AN ANTI-EMETIC ON BOARD TO TOLERATE THIS.    Consultations: Neurology   Procedures/Studies: VAS US  LOWER EXTREMITY VENOUS (DVT) (7a-7p) Result Date: 08/06/2023  Lower Venous DVT Study Patient Name:  Elizabeth [name]  Date of Exam:   08/05/2023 Medical Rec #: [id number]             Accession #:    [id number] Date of Birth: [birth year]             Patient Gender: F Patient Age:   69 years Exam Location:  [facility] Procedure:      VAS US  LOWER EXTREMITY VENOUS (DVT) Referring Phys: Corky Diener RATHORE --------------------------------------------------------------------------------  Indications: Pain.  Limitations: Poor ultrasound/tissue  interface. Comparison Study: Prior negative bilateral LEV done 01/10/22 Performing Technologist: Carleene Chase RVS  Examination Guidelines: A complete evaluation includes B-mode imaging, spectral Doppler, color Doppler, and power Doppler as needed of all accessible portions of each vessel. Bilateral testing is considered an integral part of a complete examination. Limited examinations for reoccurring indications may be performed as noted. The reflux portion of the exam is performed with the patient in reverse Trendelenburg.  +---------+---------------+---------+-----------+----------+-------------------+ RIGHT    CompressibilityPhasicitySpontaneityPropertiesThrombus Aging      +---------+---------------+---------+-----------+----------+-------------------+ CFV      Full           Yes      Yes                                      +---------+---------------+---------+-----------+----------+-------------------+ SFJ      Full                                                             +---------+---------------+---------+-----------+----------+-------------------+ FV Prox  Full           Yes      Yes                                       +---------+---------------+---------+-----------+----------+-------------------+ FV Mid   Full                                                             +---------+---------------+---------+-----------+----------+-------------------+ FV DistalFull           Yes      Yes                                      +---------+---------------+---------+-----------+----------+-------------------+ PFV      Full                                                             +---------+---------------+---------+-----------+----------+-------------------+ POP      Full           Yes      Yes                                      +---------+---------------+---------+-----------+----------+-------------------+ PTV      Full                                                             +---------+---------------+---------+-----------+----------+-------------------+ PERO  Not well visualized +---------+---------------+---------+-----------+----------+-------------------+ Gastroc  Full                                                             +---------+---------------+---------+-----------+----------+-------------------+   +----+---------------+---------+-----------+----------+--------------+ LEFTCompressibilityPhasicitySpontaneityPropertiesThrombus Aging +----+---------------+---------+-----------+----------+--------------+ CFV Full           Yes      Yes                                 +----+---------------+---------+-----------+----------+--------------+ SFJ Full                                                        +----+---------------+---------+-----------+----------+--------------+     Summary: RIGHT: - There is no evidence of deep vein thrombosis in the lower extremity.  - A cystic structure is found in the popliteal fossa measuring 2.1 x 2.78 cm.  LEFT: - No evidence of common femoral vein obstruction.   *See  table(s) above for measurements and observations. Electronically signed by Irvin Mantel on 08/06/2023 at 7:44:09 PM.    Final    ECHOCARDIOGRAM COMPLETE Result Date: 08/05/2023    ECHOCARDIOGRAM REPORT   Patient Name:   Elizabeth [name] Brearley Date of Exam: 08/05/2023 Medical Rec #:  [id number]            Height:       65.0 in Accession #:    [id number]           Weight:       235.4 lb Date of Birth:  1945-03-19            BSA:          2.120 m Patient Age:    77 years             BP:           146/66 mmHg Patient Gender: F                    HR:           96 bpm. Exam Location:  Inpatient Procedure: 2D Echo, Cardiac Doppler and Color Doppler (Both Spectral and Color            Flow Doppler were utilized during procedure). Indications:    Post TAVR evaluation Z95.2  History:        Patient has prior history of Echocardiogram examinations, most                 recent 08/07/2022. Aortic Valve Disease; Risk                 Factors:Hypertension.                 Aortic Valve: 29 mm Medtronic Evolut valve is present in the                 aortic position. Procedure Date: 05/28/19.  Sonographer:    Astrid Blamer Referring Phys: 2 BRIAN S CRENSHAW IMPRESSIONS  1. Left ventricular ejection fraction, by estimation, is 55 to 60%. The left ventricle has normal  function. The left ventricle has no regional wall motion abnormalities. The left ventricular internal cavity size was mildly dilated. Left ventricular diastolic parameters are indeterminate.  2. Right ventricular systolic function is normal. The right ventricular size is normal.  3. Left atrial size was moderately dilated.  4. The mitral valve is normal in structure. Trivial mitral valve regurgitation. Mild mitral stenosis. Moderate mitral annular calcification.  5. The aortic valve has been repaired/replaced. Aortic valve regurgitation is trivial. No aortic stenosis is present. There is a 29 mm Medtronic Evolut valve present in the aortic position. Procedure Date: 05/28/19.   6. The inferior vena cava is normal in size with greater than 50% respiratory variability, suggesting right atrial pressure of 3 mmHg. FINDINGS  Left Ventricle: Left ventricular ejection fraction, by estimation, is 55 to 60%. The left ventricle has normal function. The left ventricle has no regional wall motion abnormalities. The left ventricular internal cavity size was mildly dilated. There is  no left ventricular hypertrophy. Left ventricular diastolic parameters are indeterminate. Right Ventricle: The right ventricular size is normal. Right ventricular systolic function is normal. Left Atrium: Left atrial size was moderately dilated. Right Atrium: Right atrial size was normal in size. Pericardium: There is no evidence of pericardial effusion. Mitral Valve: The mitral valve is normal in structure. Moderate mitral annular calcification. Trivial mitral valve regurgitation. Mild mitral valve stenosis. MV peak gradient, 10.3 mmHg. The mean mitral valve gradient is 5.0 mmHg. Tricuspid Valve: The tricuspid valve is normal in structure. Tricuspid valve regurgitation is not demonstrated. No evidence of tricuspid stenosis. Aortic Valve: The aortic valve has been repaired/replaced. Aortic valve regurgitation is trivial. No aortic stenosis is present. Aortic valve mean gradient measures 9.0 mmHg. Aortic valve peak gradient measures 13.7 mmHg. Aortic valve area, by VTI measures 1.98 cm. There is a 29 mm Medtronic Evolut valve present in the aortic position. Procedure Date: 05/28/19. Pulmonic Valve: The pulmonic valve was normal in structure. Pulmonic valve regurgitation is not visualized. No evidence of pulmonic stenosis. Aorta: The aortic root is normal in size and structure. Venous: The inferior vena cava is normal in size with greater than 50% respiratory variability, suggesting right atrial pressure of 3 mmHg. IAS/Shunts: No atrial level shunt detected by color flow Doppler.  LEFT VENTRICLE PLAX 2D LVIDd:         5.40  cm LVIDs:         4.10 cm LV PW:         0.90 cm LV IVS:        1.00 cm LVOT diam:     1.80 cm LV SV:         70 LV SV Index:   33 LVOT Area:     2.54 cm  RIGHT VENTRICLE RV S prime:     12.20 cm/s TAPSE (M-mode): 2.2 cm LEFT ATRIUM              Index        RIGHT ATRIUM           Index LA Vol (A2C):   112.0 ml 52.83 ml/m  RA Area:     18.10 cm LA Vol (A4C):   68.7 ml  32.40 ml/m  RA Volume:   49.00 ml  23.11 ml/m LA Biplane Vol: 88.6 ml  41.79 ml/m  AORTIC VALVE AV Area (Vmax):    1.72 cm AV Area (Vmean):   1.71 cm AV Area (VTI):     1.98 cm AV Vmax:  185.00 cm/s AV Vmean:          142.000 cm/s AV VTI:            0.354 m AV Peak Grad:      13.7 mmHg AV Mean Grad:      9.0 mmHg LVOT Vmax:         125.00 cm/s LVOT Vmean:        95.700 cm/s LVOT VTI:          0.275 m LVOT/AV VTI ratio: 0.78 MITRAL VALVE MV Area (PHT): 4.52 cm     SHUNTS MV Area VTI:   2.38 cm     Systemic VTI:  0.28 m MV Peak grad:  10.3 mmHg    Systemic Diam: 1.80 cm MV Mean grad:  5.0 mmHg MV Vmax:       1.60 m/s MV Vmean:      102.5 cm/s MV Decel Time: 168 msec MV E velocity: 134.00 cm/s Alexandria Angel MD Electronically signed by Alexandria Angel MD Signature Date/Time: 08/05/2023/1:18:50 PM    Final    CT Angio Chest PE W/Cm &/Or Wo Cm Result Date: 08/04/2023 CLINICAL DATA:  Tachycardia chest pain EXAM: CT ANGIOGRAPHY CHEST WITH CONTRAST TECHNIQUE: Multidetector CT imaging of the chest was performed using the standard protocol during bolus administration of intravenous contrast. Multiplanar CT image reconstructions and MIPs were obtained to evaluate the vascular anatomy. RADIATION DOSE REDUCTION: This exam was performed according to the departmental dose-optimization program which includes automated exposure control, adjustment of the mA and/or kV according to patient size and/or use of iterative reconstruction technique. CONTRAST:  75mL OMNIPAQUE  IOHEXOL  350 MG/ML SOLN COMPARISON:  Chest x-ray 08/04/2023, CT 01/08/2022,  06/03/2023 FINDINGS: Cardiovascular: Satisfactory opacification of the pulmonary arteries to the segmental level. No evidence of pulmonary embolism. Limited assessment for distal PE in the lower lobes secondary to motion. Status post TAVR. Aortic atherosclerosis. Coronary vascular calcification. Mild cardiomegaly. No pericardial effusion Mediastinum/Nodes: Patent trachea. No suspicious thyroid  mass. No suspicious lymph nodes. Esophagus within normal limits Lungs/Pleura: No acute airspace disease, pleural effusion or pneumothorax. Stable scattered small pulmonary nodules, largest is in the lateral left lower lobe and measures 5 mm on series 6, image 94, no specific imaging follow-up is recommended Upper Abdomen: No acute finding. Right kidney stone measuring 9 mm incompletely visualized. Musculoskeletal: No acute or suspicious osseous abnormality. Review of the MIP images confirms the above findings. IMPRESSION: 1. Negative for acute pulmonary embolus. Limited assessment for distal PE in the lower lobes secondary to motion. 2. Mild cardiomegaly. Status post TAVR. 3. Aortic atherosclerosis. Aortic Atherosclerosis (ICD10-I70.0). Electronically Signed   By: Esmeralda Hedge M.D.   On: 08/04/2023 20:45   DG Chest 1 View Result Date: 08/04/2023 CLINICAL DATA:  Tachycardia.  Chest pain. EXAM: CHEST  1 VIEW COMPARISON:  07/12/2023 FINDINGS: The patient is rotated. Borderline cardiomegaly is stable. TAVR. Unchanged mediastinal contours allowing for rotation. No focal airspace disease, pulmonary edema, pleural effusion or pneumothorax. On limited assessment, no acute osseous findings. IMPRESSION: Low lung volumes without acute findings. Electronically Signed   By: Chadwick Colonel M.D.   On: 08/04/2023 19:11     Subjective: No complaints  Discharge Exam: Vitals:   08/07/23 0646 08/07/23 0720  BP: (!) 112/96 116/70  Pulse: (!) 57 (!) 58  Resp: 15 20  Temp:  98.9 F (37.2 C)  SpO2: 92% 91%   Vitals:   08/07/23  0118 08/07/23 0452 08/07/23 0646 08/07/23 0720  BP: 99/65 (!) 97/22 Aaron Aas)  112/96 116/70  Pulse: 99 98 (!) 57 (!) 58  Resp: 18 18 15 20   Temp: 98.4 F (36.9 C) 99 F (37.2 C)  98.9 F (37.2 C)  TempSrc: Oral Oral  Oral  SpO2: 92% 96% 92% 91%  Weight:  107.8 kg    Height:        General: Pt is alert, awake, not in acute distress Cardiovascular: RRR, S1/S2 +, no rubs, no gallops Respiratory: CTA bilaterally, no wheezing, no rhonchi Abdominal: Soft, NT, ND, bowel sounds + Extremities: no edema, no cyanosis    The results of significant diagnostics from this hospitalization (including imaging, microbiology, ancillary and laboratory) are listed below for reference.     Microbiology: No results found for this or any previous visit (from the past 240 hours).   Labs: BNP (last 3 results) Recent Labs    08/04/23 1819  BNP 37.4   Basic Metabolic Panel: Recent Labs  Lab 08/04/23 1819 08/04/23 2011 08/05/23 0057 08/06/23 2321 08/07/23 0249  NA 144  --  145 139 139  K 3.8  --  3.6 4.4 4.7  CL 109  --  111 107 106  CO2 25  --  23 23 29   GLUCOSE 103*  --  96 104* 102*  BUN 18  --  19 18 18   CREATININE 1.44*  --  1.34* 1.73* 1.85*  CALCIUM 9.3  --  9.1 8.4* 8.4*  MG  --  2.0  --   --   --    Liver Function Tests: Recent Labs  Lab 08/05/23 0057  AST 18  ALT 11  ALKPHOS 51  BILITOT 0.7  PROT 5.9*  ALBUMIN 3.3*   No results for input(s): "LIPASE", "AMYLASE" in the last 168 hours. No results for input(s): "AMMONIA" in the last 168 hours. CBC: Recent Labs  Lab 08/04/23 1819  WBC 8.1  NEUTROABS 5.1  HGB 11.0*  HCT 34.4*  MCV 94.8  PLT 178   Cardiac Enzymes: No results for input(s): "CKTOTAL", "CKMB", "CKMBINDEX", "TROPONINI" in the last 168 hours. BNP: Invalid input(s): "POCBNP" CBG: No results for input(s): "GLUCAP" in the last 168 hours. D-Dimer No results for input(s): "DDIMER" in the last 72 hours. Hgb A1c No results for input(s): "HGBA1C" in the last  72 hours. Lipid Profile No results for input(s): "CHOL", "HDL", "LDLCALC", "TRIG", "CHOLHDL", "LDLDIRECT" in the last 72 hours. Thyroid  function studies Recent Labs    08/05/23 0057  TSH 3.684   Anemia work up No results for input(s): "VITAMINB12", "FOLATE", "FERRITIN", "TIBC", "IRON", "RETICCTPCT" in the last 72 hours. Urinalysis    Component Value Date/Time   COLORURINE YELLOW 08/05/2022 1007   APPEARANCEUR CLEAR 08/05/2022 1007   LABSPEC 1.012 08/05/2022 1007   PHURINE 6.0 08/05/2022 1007   GLUCOSEU NEGATIVE 08/05/2022 1007   HGBUR SMALL (A) 08/05/2022 1007   BILIRUBINUR NEGATIVE 08/05/2022 1007   KETONESUR NEGATIVE 08/05/2022 1007   PROTEINUR NEGATIVE 08/05/2022 1007   NITRITE NEGATIVE 08/05/2022 1007   LEUKOCYTESUR NEGATIVE 08/05/2022 1007   Sepsis Labs Recent Labs  Lab 08/04/23 1819  WBC 8.1   Microbiology No results found for this or any previous visit (from the past 240 hours).   Time coordinating discharge: Over 35 minutes  SIGNED:   Macdonald Savoy, MD  [facility] 08/07/2023, 9:43 AM Pager   If 7PM-7AM, please contact night-coverage [email or web address] Password [email or web address]

## 2023-08-07 NOTE — Progress Notes (Signed)
 Progress Note  Patient Name: Elizabeth [name] Date of Encounter: 08/07/2023  Primary Cardiologist: None   Subjective   Patient seen examined by her bedside.  She was awake watching television when I arrived.  No complaints at this time  Inpatient Medications    Scheduled Meds:  aspirin  EC  81 mg Oral Daily   enoxaparin  (LOVENOX ) injection  40 mg Subcutaneous Q24H   flecainide   50 mg Oral BID   furosemide   40 mg Oral Daily   gabapentin   200 mg Oral Daily   metoprolol  tartrate  25 mg Oral BID   polyethylene glycol  17 g Oral BID   Continuous Infusions:  PRN Meds: acetaminophen  **OR** acetaminophen , docusate sodium , metoprolol  tartrate, ondansetron  (ZOFRAN ) IV, oxyCODONE    Vital Signs    Vitals:   08/07/23 0118 08/07/23 0452 08/07/23 0646 08/07/23 0720  BP: 99/65 (!) 97/22 (!) 112/96 116/70  Pulse: 99 98 (!) 57 (!) 58  Resp: 18 18 15 20   Temp: 98.4 F (36.9 C) 99 F (37.2 C)  98.9 F (37.2 C)  TempSrc: Oral Oral  Oral  SpO2: 92% 96% 92% 91%  Weight:  107.8 kg    Height:        Intake/Output Summary (Last 24 hours) at 08/07/2023 0754 Last data filed at 08/07/2023 0513 Gross per 24 hour  Intake 834 ml  Output 600 ml  Net 234 ml   Filed Weights   08/05/23 0444 08/06/23 0532 08/07/23 0452  Weight: 106.8 kg 107.3 kg 107.8 kg    Telemetry    Sinus bradycardia- Personally Reviewed  ECG     - Personally Reviewed  Physical Exam    General: Comfortable Head: Atraumatic, normal size  Eyes: PEERLA, EOMI  Neck: Supple, normal JVD Cardiac: Normal S1, S2; RRR; no murmurs, rubs, or gallops Lungs: Clear to auscultation bilaterally Abd: Soft, nontender, no hepatomegaly  Ext: warm, no edema Musculoskeletal: No deformities, BUE and BLE strength normal and equal Skin: Warm and dry, no rashes   Neuro: Alert and oriented to person, place, time, and situation, CNII-XII grossly intact, no focal deficits  Psych: Normal mood and affect   Labs     Chemistry Recent Labs  Lab 08/05/23 0057 08/06/23 2321 08/07/23 0249  NA 145 139 139  K 3.6 4.4 4.7  CL 111 107 106  CO2 23 23 29   GLUCOSE 96 104* 102*  BUN 19 18 18   CREATININE 1.34* 1.73* 1.85*  CALCIUM 9.1 8.4* 8.4*  PROT 5.9*  --   --   ALBUMIN 3.3*  --   --   AST 18  --   --   ALT 11  --   --   ALKPHOS 51  --   --   BILITOT 0.7  --   --   GFRNONAA 41* 30* 28*  ANIONGAP 11 9 4*     Hematology Recent Labs  Lab 08/04/23 1819  WBC 8.1  RBC 3.63*  HGB 11.0*  HCT 34.4*  MCV 94.8  MCH 30.3  MCHC 32.0  RDW 13.2  PLT 178    Cardiac EnzymesNo results for input(s): "TROPONINI" in the last 168 hours. No results for input(s): "TROPIPOC" in the last 168 hours.   BNP Recent Labs  Lab 08/04/23 1819  BNP 37.4     DDimer No results for input(s): "DDIMER" in the last 168 hours.   Radiology    VAS US  LOWER EXTREMITY VENOUS (DVT) (7a-7p) Result Date: 08/06/2023  Lower Venous DVT Study  Patient Name:  Elizabeth [name]  Date of Exam:   08/05/2023 Medical Rec #: [id number]             Accession #:    [id number] Date of Birth: [birth year]             Patient Gender: F Patient Age:   41 years Exam Location:  [facility] Procedure:      VAS US  LOWER EXTREMITY VENOUS (DVT) Referring Phys: Corky Diener RATHORE --------------------------------------------------------------------------------  Indications: Pain.  Limitations: Poor ultrasound/tissue interface. Comparison Study: Prior negative bilateral LEV done 01/10/22 Performing Technologist: Carleene Chase RVS  Examination Guidelines: A complete evaluation includes B-mode imaging, spectral Doppler, color Doppler, and power Doppler as needed of all accessible portions of each vessel. Bilateral testing is considered an integral part of a complete examination. Limited examinations for reoccurring indications may be performed as noted. The reflux portion of the exam is performed with the patient in reverse Trendelenburg.   +---------+---------------+---------+-----------+----------+-------------------+ RIGHT    CompressibilityPhasicitySpontaneityPropertiesThrombus Aging      +---------+---------------+---------+-----------+----------+-------------------+ CFV      Full           Yes      Yes                                      +---------+---------------+---------+-----------+----------+-------------------+ SFJ      Full                                                             +---------+---------------+---------+-----------+----------+-------------------+ FV Prox  Full           Yes      Yes                                      +---------+---------------+---------+-----------+----------+-------------------+ FV Mid   Full                                                             +---------+---------------+---------+-----------+----------+-------------------+ FV DistalFull           Yes      Yes                                      +---------+---------------+---------+-----------+----------+-------------------+ PFV      Full                                                             +---------+---------------+---------+-----------+----------+-------------------+ POP      Full           Yes      Yes                                      +---------+---------------+---------+-----------+----------+-------------------+  PTV      Full                                                             +---------+---------------+---------+-----------+----------+-------------------+ PERO                                                  Not well visualized +---------+---------------+---------+-----------+----------+-------------------+ Gastroc  Full                                                             +---------+---------------+---------+-----------+----------+-------------------+   +----+---------------+---------+-----------+----------+--------------+  LEFTCompressibilityPhasicitySpontaneityPropertiesThrombus Aging +----+---------------+---------+-----------+----------+--------------+ CFV Full           Yes      Yes                                 +----+---------------+---------+-----------+----------+--------------+ SFJ Full                                                        +----+---------------+---------+-----------+----------+--------------+     Summary: RIGHT: - There is no evidence of deep vein thrombosis in the lower extremity.  - A cystic structure is found in the popliteal fossa measuring 2.1 x 2.78 cm.  LEFT: - No evidence of common femoral vein obstruction.   *See table(s) above for measurements and observations. Electronically signed by Irvin Mantel on 08/06/2023 at 7:44:09 PM.    Final    ECHOCARDIOGRAM COMPLETE Result Date: 08/05/2023    ECHOCARDIOGRAM REPORT   Patient Name:   ALINA GILKEY Lindner Date of Exam: 08/05/2023 Medical Rec #:  [id number]            Height:       65.0 in Accession #:    [id number]           Weight:       235.4 lb Date of Birth:  1946-01-05            BSA:          2.120 m Patient Age:    77 years             BP:           146/66 mmHg Patient Gender: F                    HR:           96 bpm. Exam Location:  Inpatient Procedure: 2D Echo, Cardiac Doppler and Color Doppler (Both Spectral and Color            Flow Doppler were utilized during procedure). Indications:    Post TAVR evaluation Z95.2  History:        Patient has prior history of Echocardiogram examinations, most  recent 08/07/2022. Aortic Valve Disease; Risk                 Factors:Hypertension.                 Aortic Valve: 29 mm Medtronic Evolut valve is present in the                 aortic position. Procedure Date: 05/28/19.  Sonographer:    Astrid Blamer Referring Phys: [id number] BRIAN S CRENSHAW IMPRESSIONS  1. Left ventricular ejection fraction, by estimation, is 55 to 60%. The left ventricle has normal function. The left  ventricle has no regional wall motion abnormalities. The left ventricular internal cavity size was mildly dilated. Left ventricular diastolic parameters are indeterminate.  2. Right ventricular systolic function is normal. The right ventricular size is normal.  3. Left atrial size was moderately dilated.  4. The mitral valve is normal in structure. Trivial mitral valve regurgitation. Mild mitral stenosis. Moderate mitral annular calcification.  5. The aortic valve has been repaired/replaced. Aortic valve regurgitation is trivial. No aortic stenosis is present. There is a 29 mm Medtronic Evolut valve present in the aortic position. Procedure Date: 05/28/19.  6. The inferior vena cava is normal in size with greater than 50% respiratory variability, suggesting right atrial pressure of 3 mmHg. FINDINGS  Left Ventricle: Left ventricular ejection fraction, by estimation, is 55 to 60%. The left ventricle has normal function. The left ventricle has no regional wall motion abnormalities. The left ventricular internal cavity size was mildly dilated. There is  no left ventricular hypertrophy. Left ventricular diastolic parameters are indeterminate. Right Ventricle: The right ventricular size is normal. Right ventricular systolic function is normal. Left Atrium: Left atrial size was moderately dilated. Right Atrium: Right atrial size was normal in size. Pericardium: There is no evidence of pericardial effusion. Mitral Valve: The mitral valve is normal in structure. Moderate mitral annular calcification. Trivial mitral valve regurgitation. Mild mitral valve stenosis. MV peak gradient, 10.3 mmHg. The mean mitral valve gradient is 5.0 mmHg. Tricuspid Valve: The tricuspid valve is normal in structure. Tricuspid valve regurgitation is not demonstrated. No evidence of tricuspid stenosis. Aortic Valve: The aortic valve has been repaired/replaced. Aortic valve regurgitation is trivial. No aortic stenosis is present. Aortic valve mean  gradient measures 9.0 mmHg. Aortic valve peak gradient measures 13.7 mmHg. Aortic valve area, by VTI measures 1.98 cm. There is a 29 mm Medtronic Evolut valve present in the aortic position. Procedure Date: 05/28/19. Pulmonic Valve: The pulmonic valve was normal in structure. Pulmonic valve regurgitation is not visualized. No evidence of pulmonic stenosis. Aorta: The aortic root is normal in size and structure. Venous: The inferior vena cava is normal in size with greater than 50% respiratory variability, suggesting right atrial pressure of 3 mmHg. IAS/Shunts: No atrial level shunt detected by color flow Doppler.  LEFT VENTRICLE PLAX 2D LVIDd:         5.40 cm LVIDs:         4.10 cm LV PW:         0.90 cm LV IVS:        1.00 cm LVOT diam:     1.80 cm LV SV:         70 LV SV Index:   33 LVOT Area:     2.54 cm  RIGHT VENTRICLE RV S prime:     12.20 cm/s TAPSE (M-mode): 2.2 cm LEFT ATRIUM  Index        RIGHT ATRIUM           Index LA Vol (A2C):   112.0 ml 52.83 ml/m  RA Area:     18.10 cm LA Vol (A4C):   68.7 ml  32.40 ml/m  RA Volume:   49.00 ml  23.11 ml/m LA Biplane Vol: 88.6 ml  41.79 ml/m  AORTIC VALVE AV Area (Vmax):    1.72 cm AV Area (Vmean):   1.71 cm AV Area (VTI):     1.98 cm AV Vmax:           185.00 cm/s AV Vmean:          142.000 cm/s AV VTI:            0.354 m AV Peak Grad:      13.7 mmHg AV Mean Grad:      9.0 mmHg LVOT Vmax:         125.00 cm/s LVOT Vmean:        95.700 cm/s LVOT VTI:          0.275 m LVOT/AV VTI ratio: 0.78 MITRAL VALVE MV Area (PHT): 4.52 cm     SHUNTS MV Area VTI:   2.38 cm     Systemic VTI:  0.28 m MV Peak grad:  10.3 mmHg    Systemic Diam: 1.80 cm MV Mean grad:  5.0 mmHg MV Vmax:       1.60 m/s MV Vmean:      102.5 cm/s MV Decel Time: 168 msec MV E velocity: 134.00 cm/s Alexandria Angel MD Electronically signed by Alexandria Angel MD Signature Date/Time: 08/05/2023/1:18:50 PM    Final     Cardiac Studies     Patient Profile     78 y.o. female    Assessment & Plan    Paroxysmal atrial tachycardia  Acute on Chronic Diastolic congestive heart failure  Hypertension  Status post TAVR    Rhythm confirmed with our EP partners prior noted paroxysmal atrial tachycardia.  She was restarted on flecainide  she is in sinus rhythm today.  She is on metoprolol  tartrate 25 mg twice daily please transition her to Toprol -XL with her lower heart rate please cut that down to 12.5 mg daily (therefore she will be discharged with flecainide  50 mg twice daily and Toprol  XL 12.5 mg daily)  Her blood pressure is also on the lower side today medication adjusted as above for her beta-blocker to help with improving this as well.  Status post TAVR stable.  Acute on chronic congestive heart failure she has been diuresed remains mildly volume overloaded but I think discharging the patient on Lasix  40 mg every other day with potassium supplement will help in this regard.  She follows in our [place] office and has an appointment for June 6 already scheduled.   For questions or updates, please contact [facility] Please consult [email or web address] for contact info under Cardiology/STEMI.      Signed, Eunie Lawn, DO  08/07/2023, 7:54 AM

## 2023-08-07 NOTE — TOC CM/SW Note (Addendum)
 Transition of Care [facility]) - Inpatient Brief Assessment   Patient Details  Name: Elizabeth [name] MRN: [id number] Date of Birth: Feb 10, 1946  Transition of Care [facility]) CM/SW Contact:    Jennett Model, RN Phone Number: 08/07/2023, 8:52 AM   Clinical Narrative: From home alone, has PCP and insurance on file, states has no HH services in place at this time, has walker and rollator at home.  States family member will transport them home at [employer] and family is support system, states gets medications from [facility] on [address]  Pta self ambulatory with walker. Per pt eval rec HHPT, NCM offered choice, she has no preference.  NCM made referral to [facility].  Awaiting call back.   Transition of Care Asessment: Insurance and Status: Insurance coverage has been reviewed Patient has primary care physician: Yes Home environment has been reviewed: lives alone Prior level of function:: indep Prior/Current Home Services: Current home services (walker, rollator) Social Drivers of Health Review: SDOH reviewed no interventions necessary Readmission risk has been reviewed: Yes Transition of care needs: transition of care needs identified, TOC will continue to follow

## 2023-08-09 ENCOUNTER — Telehealth: Payer: Self-pay | Admitting: Cardiology

## 2023-08-09 NOTE — Telephone Encounter (Signed)
 Calling to get Dr Sandee Crook okay to the patient plan of care. Please advise

## 2023-08-09 NOTE — Telephone Encounter (Signed)
 Elizabeth [name] is calling to see if a provider will sign for the pt to get PT and OT after recent dc from [facility] for heart failure. Please advise

## 2023-08-10 ENCOUNTER — Ambulatory Visit: Payer: Self-pay

## 2023-08-10 NOTE — Progress Notes (Signed)
 Reviewed at nurse visit on 08-04-2023 at the office and was directed to the ER. EKG with SVT consistent with atrial tachycardia/flutter rapid ventricular rate

## 2023-08-13 DIAGNOSIS — E782 Mixed hyperlipidemia: Secondary | ICD-10-CM | POA: Diagnosis not present

## 2023-08-13 DIAGNOSIS — G4700 Insomnia, unspecified: Secondary | ICD-10-CM | POA: Diagnosis not present

## 2023-08-15 DIAGNOSIS — I4719 Other supraventricular tachycardia: Secondary | ICD-10-CM | POA: Diagnosis not present

## 2023-08-15 DIAGNOSIS — Z7189 Other specified counseling: Secondary | ICD-10-CM | POA: Diagnosis not present

## 2023-08-15 DIAGNOSIS — R2689 Other abnormalities of gait and mobility: Secondary | ICD-10-CM | POA: Diagnosis not present

## 2023-08-15 DIAGNOSIS — Z789 Other specified health status: Secondary | ICD-10-CM | POA: Diagnosis not present

## 2023-08-16 NOTE — Telephone Encounter (Signed)
 Elizabeth [name] states that Dr. Arlena Lacrosse has signed the orders.

## 2023-08-17 DIAGNOSIS — G8929 Other chronic pain: Secondary | ICD-10-CM | POA: Diagnosis not present

## 2023-08-17 DIAGNOSIS — I2510 Atherosclerotic heart disease of native coronary artery without angina pectoris: Secondary | ICD-10-CM | POA: Diagnosis not present

## 2023-08-17 DIAGNOSIS — Z7982 Long term (current) use of aspirin: Secondary | ICD-10-CM | POA: Diagnosis not present

## 2023-08-17 DIAGNOSIS — M353 Polymyalgia rheumatica: Secondary | ICD-10-CM | POA: Diagnosis not present

## 2023-08-17 DIAGNOSIS — G5751 Tarsal tunnel syndrome, right lower limb: Secondary | ICD-10-CM | POA: Diagnosis not present

## 2023-08-17 DIAGNOSIS — K642 Third degree hemorrhoids: Secondary | ICD-10-CM | POA: Diagnosis not present

## 2023-08-17 DIAGNOSIS — I130 Hypertensive heart and chronic kidney disease with heart failure and stage 1 through stage 4 chronic kidney disease, or unspecified chronic kidney disease: Secondary | ICD-10-CM | POA: Diagnosis not present

## 2023-08-17 DIAGNOSIS — M48061 Spinal stenosis, lumbar region without neurogenic claudication: Secondary | ICD-10-CM | POA: Diagnosis not present

## 2023-08-17 DIAGNOSIS — Z952 Presence of prosthetic heart valve: Secondary | ICD-10-CM | POA: Diagnosis not present

## 2023-08-17 DIAGNOSIS — I5033 Acute on chronic diastolic (congestive) heart failure: Secondary | ICD-10-CM | POA: Diagnosis not present

## 2023-08-17 DIAGNOSIS — G4700 Insomnia, unspecified: Secondary | ICD-10-CM | POA: Diagnosis not present

## 2023-08-17 DIAGNOSIS — E785 Hyperlipidemia, unspecified: Secondary | ICD-10-CM | POA: Diagnosis not present

## 2023-08-17 DIAGNOSIS — Z86718 Personal history of other venous thrombosis and embolism: Secondary | ICD-10-CM | POA: Diagnosis not present

## 2023-08-17 DIAGNOSIS — N1831 Chronic kidney disease, stage 3a: Secondary | ICD-10-CM | POA: Diagnosis not present

## 2023-08-17 DIAGNOSIS — Z87442 Personal history of urinary calculi: Secondary | ICD-10-CM | POA: Diagnosis not present

## 2023-08-17 DIAGNOSIS — G629 Polyneuropathy, unspecified: Secondary | ICD-10-CM | POA: Diagnosis not present

## 2023-08-17 DIAGNOSIS — D509 Iron deficiency anemia, unspecified: Secondary | ICD-10-CM | POA: Diagnosis not present

## 2023-08-17 DIAGNOSIS — Z9181 History of falling: Secondary | ICD-10-CM | POA: Diagnosis not present

## 2023-08-17 DIAGNOSIS — M5416 Radiculopathy, lumbar region: Secondary | ICD-10-CM | POA: Diagnosis not present

## 2023-08-17 DIAGNOSIS — Z853 Personal history of malignant neoplasm of breast: Secondary | ICD-10-CM | POA: Diagnosis not present

## 2023-08-17 DIAGNOSIS — E559 Vitamin D deficiency, unspecified: Secondary | ICD-10-CM | POA: Diagnosis not present

## 2023-08-17 DIAGNOSIS — M170 Bilateral primary osteoarthritis of knee: Secondary | ICD-10-CM | POA: Diagnosis not present

## 2023-08-17 DIAGNOSIS — K219 Gastro-esophageal reflux disease without esophagitis: Secondary | ICD-10-CM | POA: Diagnosis not present

## 2023-08-17 NOTE — Progress Notes (Signed)
 Cardiology Office Note   Date:  08/20/2023  ID:  Elizabeth, [name] 1945/07/17, MRN [id number] PCP: Elizabeth Roa, MD  [facility] Providers Cardiologist:  Elizabeth Hinds, MD     History of Present Illness Elizabeth [name] is a 78 y.o. female with a past medical history of nonobstructive CAD, HFpEF, mild bilateral carotid artery stenosis, hypertension, severe aortic stenosis s/p TAVR 2021, GERD, CKD stage III, history of breast cancer, history of DVT, history of polymyalgia rheumatica.  08/05/2023 echo EF 55 to 60%, LA moderately dilated, trivial MR, mild mitral stenosis, aortic valve was present in the appropriate position 05/18/2019 TAVR Evolut 29 mm 05/13/2019 coronary CT calcium score 494, 88th percentile 05/13/2019 carotid duplex mild bilateral carotid artery stenosis 03/11/2019 cardiac cath severe aortic stenosis, nonobstructive CAD  [facility] with [facility] in 2020 for newly diagnosed heart failure as well as newly diagnosed severe aortic stenosis.  She underwent a heart cath for preparation for TAVR revealing severe aortic stenosis also nonobstructive CAD, ultimately underwent TAVR on 05/18/2019.  She was most recently evaluated by Dr. Sandee [name] on 10/03/2022, she had been previously in the hospital with a lower GI bleed, also had episode of ectopic atrial tachycardia with started on flecainide , no changes were made to her plan of care, she was advised follow-up in 6 months.  She was admitted [facility] 08/04/2023 to 08/07/2023, noted to be in paroxysmal atrial tachycardia, she was evaluated by EP--confirmed to be atrial tachycardia and not flutter, therefore she was not anticoagulated, flecainide  was restarted and ultimately discharged on flecainide  50 mg twice daily and Toprol  XL 12.5 mg daily.  She presents today for follow-up after recent hospitalization as outlined above.  She is feeling well, she does not offer any formal complaints from a tachycardia perspective.   We discussed an ETT he following the resumption of her flecainide , she relates that she already had this completed while she was hospitalized-- there are not records to indicate this happened but she is resolute this occurred during her hospitalization a week ago. She denies chest pain, palpitations, dyspnea, pnd, orthopnea, n, v, dizziness, syncope, edema, weight gain, or early satiety.   ROS: ROS   Studies Reviewed EKG Interpretation Date/Time:  Friday August 18 2023 14:53:31 EDT Ventricular Rate:  60 PR Interval:  168 QRS Duration:  94 QT Interval:  434 QTC Calculation: 434 R Axis:   14  Text Interpretation: Normal sinus rhythm Normal ECG When compared with ECG of 05-Aug-2023 00:40, Vent. rate has decreased BY  55 BPM Confirmed by Elizabeth [name] [phone]) on 08/18/2023 2:54:06 PM    Cardiac Studies & Procedures   ______________________________________________________________________________________________ CARDIAC CATHETERIZATION  CARDIAC CATHETERIZATION 03/11/2019  Conclusion 1. Severe aortic stenosis with bulky calcification of the aortic valve leaflets, restricted mobility, and a mean transvalvular gradient of 43 mmHg. 2. Normal right heart pressures 3. Nonobstructive CAD, left-dominant, appropriate for medical therapy of coronary disease  Recommend: multidisciplinary heart team evaluation for aortic valve replacement  Findings Coronary Findings Diagnostic  Dominance: Left  Left Anterior Descending Prox LAD to Mid LAD lesion is 40% stenosed. There is diffuse nonobstructive mid LAD stenosis  Left Circumflex The vessel exhibits minimal luminal irregularities. Large, dominant circumflex with minimal irregularity, no significant stenosis.  Right Coronary Artery Not selectively injected.  Appears to be diminutive.  Intervention  No interventions have been documented.     ECHOCARDIOGRAM  ECHOCARDIOGRAM COMPLETE 08/05/2023  Narrative ECHOCARDIOGRAM REPORT    Patient  Name:   Elizabeth SAIDI  [name] Date of Exam: 08/05/2023 Medical Rec #:  [id number]            Height:       65.0 in Accession #:    [id number]           Weight:       235.4 lb Date of Birth:  1945-10-12            BSA:          2.120 m Patient Age:    77 years             BP:           146/66 mmHg Patient Gender: F                    HR:           96 bpm. Exam Location:  Inpatient  Procedure: 2D Echo, Cardiac Doppler and Color Doppler (Both Spectral and Color Flow Doppler were utilized during procedure).  Indications:    Post TAVR evaluation Z95.2  History:        Patient has prior history of Echocardiogram examinations, most recent 08/07/2022. Aortic Valve Disease; Risk Factors:Hypertension. Aortic Valve: 29 mm Medtronic Evolut valve is present in the aortic position. Procedure Date: 05/28/19.  Sonographer:    Elizabeth [name] Referring Phys: 55 Elizabeth [name]  IMPRESSIONS   1. Left ventricular ejection fraction, by estimation, is 55 to 60%. The left ventricle has normal function. The left ventricle has no regional wall motion abnormalities. The left ventricular internal cavity size was mildly dilated. Left ventricular diastolic parameters are indeterminate. 2. Right ventricular systolic function is normal. The right ventricular size is normal. 3. Left atrial size was moderately dilated. 4. The mitral valve is normal in structure. Trivial mitral valve regurgitation. Mild mitral stenosis. Moderate mitral annular calcification. 5. The aortic valve has been repaired/replaced. Aortic valve regurgitation is trivial. No aortic stenosis is present. There is a 29 mm Medtronic Evolut valve present in the aortic position. Procedure Date: 05/28/19. 6. The inferior vena cava is normal in size with greater than 50% respiratory variability, suggesting right atrial pressure of 3 mmHg.  FINDINGS Left Ventricle: Left ventricular ejection fraction, by estimation, is 55 to 60%. The left ventricle has normal  function. The left ventricle has no regional wall motion abnormalities. The left ventricular internal cavity size was mildly dilated. There is no left ventricular hypertrophy. Left ventricular diastolic parameters are indeterminate.  Right Ventricle: The right ventricular size is normal. Right ventricular systolic function is normal.  Left Atrium: Left atrial size was moderately dilated.  Right Atrium: Right atrial size was normal in size.  Pericardium: There is no evidence of pericardial effusion.  Mitral Valve: The mitral valve is normal in structure. Moderate mitral annular calcification. Trivial mitral valve regurgitation. Mild mitral valve stenosis. MV peak gradient, 10.3 mmHg. The mean mitral valve gradient is 5.0 mmHg.  Tricuspid Valve: The tricuspid valve is normal in structure. Tricuspid valve regurgitation is not demonstrated. No evidence of tricuspid stenosis.  Aortic Valve: The aortic valve has been repaired/replaced. Aortic valve regurgitation is trivial. No aortic stenosis is present. Aortic valve mean gradient measures 9.0 mmHg. Aortic valve peak gradient measures 13.7 mmHg. Aortic valve area, by VTI measures 1.98 cm. There is a 29 mm Medtronic Evolut valve present in the aortic position. Procedure Date: 05/28/19.  Pulmonic Valve: The pulmonic valve was normal in structure. Pulmonic valve regurgitation is not visualized. No evidence of  pulmonic stenosis.  Aorta: The aortic root is normal in size and structure.  Venous: The inferior vena cava is normal in size with greater than 50% respiratory variability, suggesting right atrial pressure of 3 mmHg.  IAS/Shunts: No atrial level shunt detected by color flow Doppler.   LEFT VENTRICLE PLAX 2D LVIDd:         5.40 cm LVIDs:         4.10 cm LV PW:         0.90 cm LV IVS:        1.00 cm LVOT diam:     1.80 cm LV SV:         70 LV SV Index:   33 LVOT Area:     2.54 cm   RIGHT VENTRICLE RV S prime:     12.20 cm/s TAPSE  (M-mode): 2.2 cm  LEFT ATRIUM              Index        RIGHT ATRIUM           Index LA Vol (A2C):   112.0 ml 52.83 ml/m  RA Area:     18.10 cm LA Vol (A4C):   68.7 ml  32.40 ml/m  RA Volume:   49.00 ml  23.11 ml/m LA Biplane Vol: 88.6 ml  41.79 ml/m AORTIC VALVE AV Area (Vmax):    1.72 cm AV Area (Vmean):   1.71 cm AV Area (VTI):     1.98 cm AV Vmax:           185.00 cm/s AV Vmean:          142.000 cm/s AV VTI:            0.354 m AV Peak Grad:      13.7 mmHg AV Mean Grad:      9.0 mmHg LVOT Vmax:         125.00 cm/s LVOT Vmean:        95.700 cm/s LVOT VTI:          0.275 m LVOT/AV VTI ratio: 0.78  MITRAL VALVE MV Area (PHT): 4.52 cm     SHUNTS MV Area VTI:   2.38 cm     Systemic VTI:  0.28 m MV Peak grad:  10.3 mmHg    Systemic Diam: 1.80 cm MV Mean grad:  5.0 mmHg MV Vmax:       1.60 m/s MV Vmean:      102.5 cm/s MV Decel Time: 168 msec MV E velocity: 134.00 cm/s  Alexandria Angel MD Electronically signed by Alexandria Angel MD Signature Date/Time: 08/05/2023/1:18:50 PM    Final    MONITORS  LONG TERM MONITOR (3-14 DAYS) 05/19/2020  Narrative Patch Wear Time:  6 days and 17 hours (2022-02-15T11:45:51-0500 to 2022-02-22T05:42:06-498)  Patient had a min HR of 42 bpm, max HR of 171 bpm, and avg HR of 67 bpm. Predominant underlying rhythm was Sinus Rhythm. 19 Supraventricular Tachycardia runs occurred, the run with the fastest interval lasting 6 beats with a max rate of 171 bpm, the longest lasting 16 beats with an avg rate of 149 bpm. Idioventricular Rhythm was present. Isolated SVEs were occasional (3.5%, 22694), SVE Couplets were rare (<1.0%, 475), and SVE Triplets were rare (<1.0%, 28). Isolated VEs were frequent (5.5%, [id number]), VE Couplets were rare (<1.0%, 141), and VE Triplets were rare (<1.0%, 3). Ventricular Bigeminy and Trigeminy were present.  An event monitor was performed for 7 days to assess palpitation. Cardiac  rhythm was sinus with average, minimum  maximal heart rates of 67, 42 and 121 bpm. There were no pauses of 3 seconds or greater no episodes of second or third-degree AV node block or sinus node exit block. Ventricular ectopy was frequent with PVCs 141 couplets and 3 triplets.  There was one 6 beat run of PVCs accelerated idioventricular rhythm at a rate of 95 to 100 bpm. Supraventricular ectopy was occasional.  There were no episodes of atrial fibrillation or flutter.  There were 19 brief runs of APCs the longest 16 complexes at a rate of 150 bpm which appeared to be an organized SVT with a long RP interval and inverted P wave.  Conclusion occasional PVCs with couplets triplets and burden of 5.5% and occasional APCs with brief runs of APCs.   CT SCANS  CT CORONARY MORPH W/CTA COR W/SCORE 05/14/2019  Addendum 05/14/2019  5:21 PM ADDENDUM REPORT: 05/14/2019 17:18  CLINICAL DATA:  21 -year-old female with severe aortic stenosis being evaluated for a TAVR procedure.  EXAM: Cardiac TAVR CT  TECHNIQUE: The patient was scanned on a [employer]. A 120 kV retrospective scan was triggered in the descending thoracic aorta at 111 HU's. Gantry rotation speed was 250 msecs and collimation was .6 mm. No beta blockade or nitro were given. The 3D data set was reconstructed in 5% intervals of the R-R cycle. Systolic and diastolic phases were analyzed on a dedicated work station using MPR, MIP and VRT modes. The patient received 80 cc of contrast.  FINDINGS: Aortic Root:  Aortic valve: Tricuspid  Aortic valve calcium score: 2715  Aortic annulus:  Diameter: 25mm x 22mm  Perimeter: 74mm  Area: 423 mm^2  Calcifications: No calcifications  Coronary height: Min Left -19mm , Max Left -25mm ; Min Right - 13mm  Sinotubular height: Left cusp -26mm ; Right cusp - 18mm; Noncoronary cusp - 20mm  LVOT (as measured 3 mm below the annulus):  Diameter: 26mm x 21mm  Area: 358mm^2  Calcifications: Moderate LVOT calcification  located 5mm inferior to annulus, beneath noncoronary cusp  Aortic sinus width: Left cusp - 35mm; Right cusp - 33mm; Noncoronary cusp -34mm;  Sinotubular junction width: 34mm x 32mm  Optimum Fluoroscopic Angle for Delivery: LAO 3 CRA 21  Cardiac:  Right atrium: Normal size  Right ventricle: Mildly dilated  Pulmonary arteries: Mildly dilated, measures 31mm in main PA  Pulmonary veins: Normal configuration  Left atrium: Moderately dilated  Left ventricle: Moderately dilated  Pericardium: Normal thickness  Coronary arteries: Calcium score 494 (88th percentile)  IMPRESSION: 1.  Tricuspid aortic valve, severely calcified (calcium score 2715)  2. Aortic annulus measures 25mm x 22mm in diameter with area 423 mm^2 and perimeter 74mm. No annular calcifications. Annular measurements suitable for delivery of 23mm Edwards-Sapien 3 valve  3. Moderate LVOT calcification located 5mm inferior to annulus, beneath noncoronary cusp  4. Sufficient coronary to annulus distance, measures 19mm from left main to annulus, and 13mm from RCA to annulus  5. Optimum Fluoroscopic Angle for Delivery (centered on right coronary cusp): LAO 3 CRA 21  6.  Coronary calcium score 494 (88th percentile)   Electronically Signed By: Carson Clara MD On: 05/14/2019 17:18  Narrative EXAM: OVER-READ INTERPRETATION  CT CHEST  The following report is an over-read performed by radiologist Dr. Alexandria Angel of [facility] on 05/14/2019. This over-read does not include interpretation of cardiac or coronary anatomy or pathology. The coronary calcium score/coronary CTA interpretation by the cardiologist is attached.  COMPARISON:  None.  FINDINGS: Extracardiac findings will be described separately under dictation for contemporaneously obtained CTA chest, abdomen and pelvis.  IMPRESSION: Please see separate dictation for contemporaneously obtained CTA chest, abdomen and pelvis  05/14/2019 for full description of relevant extracardiac findings.  Electronically Signed: By: Alexandria Angel M.D. On: 05/14/2019 10:56     ______________________________________________________________________________________________      Risk Assessment/Calculations        Physical Exam VS:  BP (!) 155/85   Pulse 60   Ht 5\' 5"  (1.651 m)   Wt 237 lb (107.5 kg)   SpO2 94%   BMI 39.44 kg/m    Wt Readings from Last 3 Encounters:  08/18/23 237 lb (107.5 kg)  08/07/23 237 lb 11.2 oz (107.8 kg)  08/04/23 235 lb (106.6 kg)    GEN: Well nourished, well developed in no acute distress NECK: No JVD; No carotid bruits CARDIAC: RRR, no murmurs, rubs, gallops RESPIRATORY:  Clear to auscultation without rales, wheezing or rhonchi  ABDOMEN: Soft, non-tender, non-distended EXTREMITIES:  No edema; No deformity   ASSESSMENT AND PLAN Paroxysmal atrial tachycardia - currently on flecainide  50mg  twice daily, QRS remains narrow, states she has a ETT last week but I do not see evidence of this, however she does not think she could repeat d/t mobility issues and currently walking with a cane. Continue Toprol  succinate 12.5 mg daily.   Hypertension-blood pressure is elevated in the office today 155/85, continue lisinopril  10 mg daily, will continue to monitor, for blood pressure remains elevated we can increase her lisinopril  to 20 mg daily.  CAD - non-obstructive per [facility] in 2020.  Continue aspirin  81 mg daily, continue metoprolol  succinate 12.5 mg daily, stable with no anginal symptoms. No indication for ischemic evaluation.    Severe aortic stenosis s/p TAVR-echo during most recent hospitalization revealed valve functioning appropriately.  Azithromycin  for SBE prophylaxis.       Dispo:   Signed, Terrance Ferretti, NP

## 2023-08-18 ENCOUNTER — Ambulatory Visit: Attending: Cardiology | Admitting: Cardiology

## 2023-08-18 ENCOUNTER — Encounter: Payer: Self-pay | Admitting: Cardiology

## 2023-08-18 VITALS — BP 155/85 | HR 60 | Ht 65.0 in | Wt 237.0 lb

## 2023-08-18 DIAGNOSIS — Z952 Presence of prosthetic heart valve: Secondary | ICD-10-CM | POA: Diagnosis not present

## 2023-08-18 DIAGNOSIS — I2510 Atherosclerotic heart disease of native coronary artery without angina pectoris: Secondary | ICD-10-CM | POA: Diagnosis not present

## 2023-08-18 DIAGNOSIS — I10 Essential (primary) hypertension: Secondary | ICD-10-CM

## 2023-08-18 DIAGNOSIS — I350 Nonrheumatic aortic (valve) stenosis: Secondary | ICD-10-CM | POA: Diagnosis not present

## 2023-08-18 DIAGNOSIS — Z79899 Other long term (current) drug therapy: Secondary | ICD-10-CM

## 2023-08-18 DIAGNOSIS — I4719 Other supraventricular tachycardia: Secondary | ICD-10-CM | POA: Diagnosis not present

## 2023-08-18 NOTE — Patient Instructions (Signed)
 Medication Instructions:  Your physician recommends that you continue on your current medications as directed. Please refer to the Current Medication list given to you today.  *If you need a refill on your cardiac medications before your next appointment, please call your pharmacy*  Lab Work: NONE If you have labs (blood work) drawn today and your tests are completely normal, you will receive your results only by: MyChart Message (if you have MyChart) OR A paper copy in the mail If you have any lab test that is abnormal or we need to change your treatment, we will call you to review the results.  Testing/Procedures: NONE  Follow-Up: At [facility], you and your health needs are our priority.  As part of our continuing mission to provide you with exceptional heart care, our providers are all part of one team.  This team includes your primary Cardiologist (physician) and Advanced Practice Providers or APPs (Physician Assistants and Nurse Practitioners) who all work together to provide you with the care you need, when you need it.  Your next appointment:   3 month(s)  Provider:   Zoe Hinds, MD    We recommend signing up for the patient portal called "MyChart".  Sign up information is provided on this After Visit Summary.  MyChart is used to connect with patients for Virtual Visits (Telemedicine).  Patients are able to view lab/test results, encounter notes, upcoming appointments, etc.  Non-urgent messages can be sent to your provider as well.   To learn more about what you can do with MyChart, go to [email or web address].   Other Instructions May take Melatonin as needed for sleep  ** you may take OTC melatonin 1 mg each night to help with sleep

## 2023-08-23 DIAGNOSIS — M170 Bilateral primary osteoarthritis of knee: Secondary | ICD-10-CM | POA: Diagnosis not present

## 2023-08-23 DIAGNOSIS — M5416 Radiculopathy, lumbar region: Secondary | ICD-10-CM | POA: Diagnosis not present

## 2023-08-23 DIAGNOSIS — G4700 Insomnia, unspecified: Secondary | ICD-10-CM | POA: Diagnosis not present

## 2023-08-23 DIAGNOSIS — M353 Polymyalgia rheumatica: Secondary | ICD-10-CM | POA: Diagnosis not present

## 2023-08-23 DIAGNOSIS — N1831 Chronic kidney disease, stage 3a: Secondary | ICD-10-CM | POA: Diagnosis not present

## 2023-08-23 DIAGNOSIS — K219 Gastro-esophageal reflux disease without esophagitis: Secondary | ICD-10-CM | POA: Diagnosis not present

## 2023-08-23 DIAGNOSIS — Z952 Presence of prosthetic heart valve: Secondary | ICD-10-CM | POA: Diagnosis not present

## 2023-08-23 DIAGNOSIS — G5751 Tarsal tunnel syndrome, right lower limb: Secondary | ICD-10-CM | POA: Diagnosis not present

## 2023-08-23 DIAGNOSIS — D509 Iron deficiency anemia, unspecified: Secondary | ICD-10-CM | POA: Diagnosis not present

## 2023-08-23 DIAGNOSIS — Z87442 Personal history of urinary calculi: Secondary | ICD-10-CM | POA: Diagnosis not present

## 2023-08-23 DIAGNOSIS — E559 Vitamin D deficiency, unspecified: Secondary | ICD-10-CM | POA: Diagnosis not present

## 2023-08-23 DIAGNOSIS — Z7982 Long term (current) use of aspirin: Secondary | ICD-10-CM | POA: Diagnosis not present

## 2023-08-23 DIAGNOSIS — Z86718 Personal history of other venous thrombosis and embolism: Secondary | ICD-10-CM | POA: Diagnosis not present

## 2023-08-23 DIAGNOSIS — M48061 Spinal stenosis, lumbar region without neurogenic claudication: Secondary | ICD-10-CM | POA: Diagnosis not present

## 2023-08-23 DIAGNOSIS — I5033 Acute on chronic diastolic (congestive) heart failure: Secondary | ICD-10-CM | POA: Diagnosis not present

## 2023-08-23 DIAGNOSIS — G8929 Other chronic pain: Secondary | ICD-10-CM | POA: Diagnosis not present

## 2023-08-23 DIAGNOSIS — I130 Hypertensive heart and chronic kidney disease with heart failure and stage 1 through stage 4 chronic kidney disease, or unspecified chronic kidney disease: Secondary | ICD-10-CM | POA: Diagnosis not present

## 2023-08-23 DIAGNOSIS — Z853 Personal history of malignant neoplasm of breast: Secondary | ICD-10-CM | POA: Diagnosis not present

## 2023-08-23 DIAGNOSIS — K642 Third degree hemorrhoids: Secondary | ICD-10-CM | POA: Diagnosis not present

## 2023-08-23 DIAGNOSIS — I2510 Atherosclerotic heart disease of native coronary artery without angina pectoris: Secondary | ICD-10-CM | POA: Diagnosis not present

## 2023-08-23 DIAGNOSIS — Z9181 History of falling: Secondary | ICD-10-CM | POA: Diagnosis not present

## 2023-08-23 DIAGNOSIS — E785 Hyperlipidemia, unspecified: Secondary | ICD-10-CM | POA: Diagnosis not present

## 2023-08-23 DIAGNOSIS — G629 Polyneuropathy, unspecified: Secondary | ICD-10-CM | POA: Diagnosis not present

## 2023-08-31 DIAGNOSIS — M109 Gout, unspecified: Secondary | ICD-10-CM | POA: Diagnosis not present

## 2023-09-05 DIAGNOSIS — M5416 Radiculopathy, lumbar region: Secondary | ICD-10-CM | POA: Diagnosis not present

## 2023-09-05 DIAGNOSIS — E559 Vitamin D deficiency, unspecified: Secondary | ICD-10-CM | POA: Diagnosis not present

## 2023-09-05 DIAGNOSIS — E785 Hyperlipidemia, unspecified: Secondary | ICD-10-CM | POA: Diagnosis not present

## 2023-09-05 DIAGNOSIS — I5033 Acute on chronic diastolic (congestive) heart failure: Secondary | ICD-10-CM | POA: Diagnosis not present

## 2023-09-05 DIAGNOSIS — D509 Iron deficiency anemia, unspecified: Secondary | ICD-10-CM | POA: Diagnosis not present

## 2023-09-05 DIAGNOSIS — Z7982 Long term (current) use of aspirin: Secondary | ICD-10-CM | POA: Diagnosis not present

## 2023-09-05 DIAGNOSIS — Z9181 History of falling: Secondary | ICD-10-CM | POA: Diagnosis not present

## 2023-09-05 DIAGNOSIS — G4700 Insomnia, unspecified: Secondary | ICD-10-CM | POA: Diagnosis not present

## 2023-09-05 DIAGNOSIS — I130 Hypertensive heart and chronic kidney disease with heart failure and stage 1 through stage 4 chronic kidney disease, or unspecified chronic kidney disease: Secondary | ICD-10-CM | POA: Diagnosis not present

## 2023-09-05 DIAGNOSIS — Z853 Personal history of malignant neoplasm of breast: Secondary | ICD-10-CM | POA: Diagnosis not present

## 2023-09-05 DIAGNOSIS — G5751 Tarsal tunnel syndrome, right lower limb: Secondary | ICD-10-CM | POA: Diagnosis not present

## 2023-09-05 DIAGNOSIS — Z87442 Personal history of urinary calculi: Secondary | ICD-10-CM | POA: Diagnosis not present

## 2023-09-05 DIAGNOSIS — M48061 Spinal stenosis, lumbar region without neurogenic claudication: Secondary | ICD-10-CM | POA: Diagnosis not present

## 2023-09-05 DIAGNOSIS — N1831 Chronic kidney disease, stage 3a: Secondary | ICD-10-CM | POA: Diagnosis not present

## 2023-09-05 DIAGNOSIS — K219 Gastro-esophageal reflux disease without esophagitis: Secondary | ICD-10-CM | POA: Diagnosis not present

## 2023-09-05 DIAGNOSIS — K642 Third degree hemorrhoids: Secondary | ICD-10-CM | POA: Diagnosis not present

## 2023-09-05 DIAGNOSIS — Z952 Presence of prosthetic heart valve: Secondary | ICD-10-CM | POA: Diagnosis not present

## 2023-09-05 DIAGNOSIS — I2510 Atherosclerotic heart disease of native coronary artery without angina pectoris: Secondary | ICD-10-CM | POA: Diagnosis not present

## 2023-09-05 DIAGNOSIS — G8929 Other chronic pain: Secondary | ICD-10-CM | POA: Diagnosis not present

## 2023-09-05 DIAGNOSIS — M170 Bilateral primary osteoarthritis of knee: Secondary | ICD-10-CM | POA: Diagnosis not present

## 2023-09-05 DIAGNOSIS — M353 Polymyalgia rheumatica: Secondary | ICD-10-CM | POA: Diagnosis not present

## 2023-09-05 DIAGNOSIS — Z86718 Personal history of other venous thrombosis and embolism: Secondary | ICD-10-CM | POA: Diagnosis not present

## 2023-09-05 DIAGNOSIS — G629 Polyneuropathy, unspecified: Secondary | ICD-10-CM | POA: Diagnosis not present

## 2023-09-07 DIAGNOSIS — M353 Polymyalgia rheumatica: Secondary | ICD-10-CM | POA: Diagnosis not present

## 2023-09-07 DIAGNOSIS — Z952 Presence of prosthetic heart valve: Secondary | ICD-10-CM | POA: Diagnosis not present

## 2023-09-07 DIAGNOSIS — G5751 Tarsal tunnel syndrome, right lower limb: Secondary | ICD-10-CM | POA: Diagnosis not present

## 2023-09-07 DIAGNOSIS — Z86718 Personal history of other venous thrombosis and embolism: Secondary | ICD-10-CM | POA: Diagnosis not present

## 2023-09-07 DIAGNOSIS — E785 Hyperlipidemia, unspecified: Secondary | ICD-10-CM | POA: Diagnosis not present

## 2023-09-07 DIAGNOSIS — G8929 Other chronic pain: Secondary | ICD-10-CM | POA: Diagnosis not present

## 2023-09-07 DIAGNOSIS — Z7982 Long term (current) use of aspirin: Secondary | ICD-10-CM | POA: Diagnosis not present

## 2023-09-07 DIAGNOSIS — G4700 Insomnia, unspecified: Secondary | ICD-10-CM | POA: Diagnosis not present

## 2023-09-07 DIAGNOSIS — M170 Bilateral primary osteoarthritis of knee: Secondary | ICD-10-CM | POA: Diagnosis not present

## 2023-09-07 DIAGNOSIS — G629 Polyneuropathy, unspecified: Secondary | ICD-10-CM | POA: Diagnosis not present

## 2023-09-07 DIAGNOSIS — N1831 Chronic kidney disease, stage 3a: Secondary | ICD-10-CM | POA: Diagnosis not present

## 2023-09-07 DIAGNOSIS — K642 Third degree hemorrhoids: Secondary | ICD-10-CM | POA: Diagnosis not present

## 2023-09-07 DIAGNOSIS — E559 Vitamin D deficiency, unspecified: Secondary | ICD-10-CM | POA: Diagnosis not present

## 2023-09-07 DIAGNOSIS — Z9181 History of falling: Secondary | ICD-10-CM | POA: Diagnosis not present

## 2023-09-07 DIAGNOSIS — Z87442 Personal history of urinary calculi: Secondary | ICD-10-CM | POA: Diagnosis not present

## 2023-09-07 DIAGNOSIS — D509 Iron deficiency anemia, unspecified: Secondary | ICD-10-CM | POA: Diagnosis not present

## 2023-09-07 DIAGNOSIS — I130 Hypertensive heart and chronic kidney disease with heart failure and stage 1 through stage 4 chronic kidney disease, or unspecified chronic kidney disease: Secondary | ICD-10-CM | POA: Diagnosis not present

## 2023-09-07 DIAGNOSIS — I5033 Acute on chronic diastolic (congestive) heart failure: Secondary | ICD-10-CM | POA: Diagnosis not present

## 2023-09-07 DIAGNOSIS — K219 Gastro-esophageal reflux disease without esophagitis: Secondary | ICD-10-CM | POA: Diagnosis not present

## 2023-09-07 DIAGNOSIS — Z853 Personal history of malignant neoplasm of breast: Secondary | ICD-10-CM | POA: Diagnosis not present

## 2023-09-07 DIAGNOSIS — I2510 Atherosclerotic heart disease of native coronary artery without angina pectoris: Secondary | ICD-10-CM | POA: Diagnosis not present

## 2023-09-07 DIAGNOSIS — M5416 Radiculopathy, lumbar region: Secondary | ICD-10-CM | POA: Diagnosis not present

## 2023-09-07 DIAGNOSIS — M48061 Spinal stenosis, lumbar region without neurogenic claudication: Secondary | ICD-10-CM | POA: Diagnosis not present

## 2023-09-11 DIAGNOSIS — M109 Gout, unspecified: Secondary | ICD-10-CM | POA: Diagnosis not present

## 2023-09-11 DIAGNOSIS — M170 Bilateral primary osteoarthritis of knee: Secondary | ICD-10-CM | POA: Diagnosis not present

## 2023-09-12 DIAGNOSIS — E782 Mixed hyperlipidemia: Secondary | ICD-10-CM | POA: Diagnosis not present

## 2023-09-12 DIAGNOSIS — G4700 Insomnia, unspecified: Secondary | ICD-10-CM | POA: Diagnosis not present

## 2023-09-14 DIAGNOSIS — Z9181 History of falling: Secondary | ICD-10-CM | POA: Diagnosis not present

## 2023-09-14 DIAGNOSIS — Z86718 Personal history of other venous thrombosis and embolism: Secondary | ICD-10-CM | POA: Diagnosis not present

## 2023-09-14 DIAGNOSIS — Z87442 Personal history of urinary calculi: Secondary | ICD-10-CM | POA: Diagnosis not present

## 2023-09-14 DIAGNOSIS — K219 Gastro-esophageal reflux disease without esophagitis: Secondary | ICD-10-CM | POA: Diagnosis not present

## 2023-09-14 DIAGNOSIS — E559 Vitamin D deficiency, unspecified: Secondary | ICD-10-CM | POA: Diagnosis not present

## 2023-09-14 DIAGNOSIS — I2510 Atherosclerotic heart disease of native coronary artery without angina pectoris: Secondary | ICD-10-CM | POA: Diagnosis not present

## 2023-09-14 DIAGNOSIS — Z952 Presence of prosthetic heart valve: Secondary | ICD-10-CM | POA: Diagnosis not present

## 2023-09-14 DIAGNOSIS — N1831 Chronic kidney disease, stage 3a: Secondary | ICD-10-CM | POA: Diagnosis not present

## 2023-09-14 DIAGNOSIS — M5416 Radiculopathy, lumbar region: Secondary | ICD-10-CM | POA: Diagnosis not present

## 2023-09-14 DIAGNOSIS — E785 Hyperlipidemia, unspecified: Secondary | ICD-10-CM | POA: Diagnosis not present

## 2023-09-14 DIAGNOSIS — G4700 Insomnia, unspecified: Secondary | ICD-10-CM | POA: Diagnosis not present

## 2023-09-14 DIAGNOSIS — Z853 Personal history of malignant neoplasm of breast: Secondary | ICD-10-CM | POA: Diagnosis not present

## 2023-09-14 DIAGNOSIS — G629 Polyneuropathy, unspecified: Secondary | ICD-10-CM | POA: Diagnosis not present

## 2023-09-14 DIAGNOSIS — K642 Third degree hemorrhoids: Secondary | ICD-10-CM | POA: Diagnosis not present

## 2023-09-14 DIAGNOSIS — G8929 Other chronic pain: Secondary | ICD-10-CM | POA: Diagnosis not present

## 2023-09-14 DIAGNOSIS — G5751 Tarsal tunnel syndrome, right lower limb: Secondary | ICD-10-CM | POA: Diagnosis not present

## 2023-09-14 DIAGNOSIS — D509 Iron deficiency anemia, unspecified: Secondary | ICD-10-CM | POA: Diagnosis not present

## 2023-09-14 DIAGNOSIS — M48061 Spinal stenosis, lumbar region without neurogenic claudication: Secondary | ICD-10-CM | POA: Diagnosis not present

## 2023-09-14 DIAGNOSIS — Z7982 Long term (current) use of aspirin: Secondary | ICD-10-CM | POA: Diagnosis not present

## 2023-09-14 DIAGNOSIS — I5033 Acute on chronic diastolic (congestive) heart failure: Secondary | ICD-10-CM | POA: Diagnosis not present

## 2023-09-14 DIAGNOSIS — I130 Hypertensive heart and chronic kidney disease with heart failure and stage 1 through stage 4 chronic kidney disease, or unspecified chronic kidney disease: Secondary | ICD-10-CM | POA: Diagnosis not present

## 2023-09-14 DIAGNOSIS — M170 Bilateral primary osteoarthritis of knee: Secondary | ICD-10-CM | POA: Diagnosis not present

## 2023-09-14 DIAGNOSIS — M353 Polymyalgia rheumatica: Secondary | ICD-10-CM | POA: Diagnosis not present

## 2023-09-19 DIAGNOSIS — M1A3710 Chronic gout due to renal impairment, right ankle and foot, without tophus (tophi): Secondary | ICD-10-CM | POA: Diagnosis not present

## 2023-09-19 DIAGNOSIS — H8112 Benign paroxysmal vertigo, left ear: Secondary | ICD-10-CM | POA: Diagnosis not present

## 2023-09-19 DIAGNOSIS — N1831 Chronic kidney disease, stage 3a: Secondary | ICD-10-CM | POA: Diagnosis not present

## 2023-10-09 DIAGNOSIS — H524 Presbyopia: Secondary | ICD-10-CM | POA: Diagnosis not present

## 2023-10-09 DIAGNOSIS — H353131 Nonexudative age-related macular degeneration, bilateral, early dry stage: Secondary | ICD-10-CM | POA: Diagnosis not present

## 2023-10-13 DIAGNOSIS — I5030 Unspecified diastolic (congestive) heart failure: Secondary | ICD-10-CM | POA: Diagnosis not present

## 2023-10-13 DIAGNOSIS — E782 Mixed hyperlipidemia: Secondary | ICD-10-CM | POA: Diagnosis not present

## 2023-10-13 DIAGNOSIS — R220 Localized swelling, mass and lump, head: Secondary | ICD-10-CM | POA: Diagnosis not present

## 2023-10-27 DIAGNOSIS — R220 Localized swelling, mass and lump, head: Secondary | ICD-10-CM | POA: Diagnosis not present

## 2023-10-27 DIAGNOSIS — I4719 Other supraventricular tachycardia: Secondary | ICD-10-CM | POA: Diagnosis not present

## 2023-10-27 DIAGNOSIS — K146 Glossodynia: Secondary | ICD-10-CM | POA: Diagnosis not present

## 2023-11-02 ENCOUNTER — Telehealth: Payer: Self-pay | Admitting: Cardiology

## 2023-11-02 NOTE — Telephone Encounter (Signed)
 Pt c/o medication issue:  1. Name of Medication:  Metoprolol  12.5 MG tablet  2. How are you currently taking this medication (dosage and times per day)?   3. Are you having a reaction (difficulty breathing--STAT)?   4. What is your medication issue?   Patient says Friday Dr. Melonie increased Metoprolol  from 1/2 tablet to a whole tablet and she would like to confirm whether Dr. Monetta agrees. Please advise.

## 2023-11-03 NOTE — Telephone Encounter (Signed)
 Called the patient and she reported that her heart rate has been going up and down. Her heart rate has been 160 to 70's to 80's. Patient states that she walks from the living room to the kitchen and she is tired and needs to sit down and rest. She also states that she doesn't feel anything when her heart rate goes up and down. Patient has a history of atrial tachycardia. Her recent blood pressures are:  8/19 - 138/81 8/20 - 130/78 HR 76 8/21 - 126/76 HR 129 8/22 - 138/76 HR 164  She recently saw Dr. Melonie and she increased the patient's metoprolol  from 1/2 tab to one full tablet. Please advise.

## 2023-11-03 NOTE — Telephone Encounter (Signed)
Patient is calling back to follow up.

## 2023-11-04 ENCOUNTER — Encounter: Payer: Self-pay | Admitting: Physician Assistant

## 2023-11-04 DIAGNOSIS — R002 Palpitations: Secondary | ICD-10-CM | POA: Diagnosis not present

## 2023-11-04 DIAGNOSIS — R000 Tachycardia, unspecified: Secondary | ICD-10-CM | POA: Diagnosis not present

## 2023-11-04 DIAGNOSIS — I10 Essential (primary) hypertension: Secondary | ICD-10-CM | POA: Diagnosis not present

## 2023-11-04 DIAGNOSIS — R9431 Abnormal electrocardiogram [ECG] [EKG]: Secondary | ICD-10-CM | POA: Diagnosis not present

## 2023-11-04 DIAGNOSIS — I4711 Inappropriate sinus tachycardia, so stated: Secondary | ICD-10-CM | POA: Diagnosis not present

## 2023-11-04 NOTE — Telephone Encounter (Signed)
 This encounter was created in error - please disregard.

## 2023-11-04 NOTE — Telephone Encounter (Signed)
 Pt w hx of nonobstructive CAD, (HFpEF) heart failure with preserved ejection fraction, AS s/p TAVR and parox atrial tachycardia. She called the office earlier this week for palpitations and elevated HR.   She is on Flecainide . She is also on Metoprolol  succinate 12.5 >> increased to 25 mg once daily by PCP.  She called today with symptoms of significant exhaustion and elevated HRs. Her HR increases to 160 at rest. She has not had chest pain, syncope, shortness of breath. Minimal exertion causes significant fatigue/exhaustion. She feels worse since this started 1-2 weeks ago. It woke her up at 4 am and she could not go back to sleep.   PLAN: I advised her to go to [facility] ED today for evaluation. She agrees. Glendia Ferrier, PA-C    11/04/2023 10:16 AM

## 2023-11-06 NOTE — Telephone Encounter (Signed)
 Pt is calling back to report that she did go to the ER as directed and doctor was unsure of what was happening. He spoke with Dr. Monetta on Saturday and Dr. Monetta advised pt start taking an extra metoprolol  at night and to c/b on Monday to report how she is doing.  HR is still erratic but not nearly as erratic as before. At one point yesterday HR was 67, and 8 minutes later it was 119.  Pt has only taken two extra metoprolol , one Saturday night and one Sunday night.  Pt is only aware on fluctuation due to smart watch; pt is not feeling any pain or discomfort but does report feeling much more tired than usual.

## 2023-11-07 ENCOUNTER — Ambulatory Visit: Admitting: Cardiology

## 2023-11-08 ENCOUNTER — Other Ambulatory Visit: Payer: Self-pay | Admitting: Cardiology

## 2023-11-08 NOTE — Progress Notes (Deleted)
 Cardiology Office Note:    Date:  11/08/2023   ID:  Elizabeth, [name] [birth year], MRN [id number]  PCP:  Trinidad Hun, MD  Cardiologist:  Redell Leiter, MD    Referring MD: Trinidad Hun, MD    ASSESSMENT:    No diagnosis found. PLAN:    In order of problems listed above:  ***   Next appointment: ***   Medication Adjustments/Labs and Tests Ordered: Current medicines are reviewed at length with the patient today.  Concerns regarding medicines are outlined above.  No orders of the defined types were placed in this encounter.  No orders of the defined types were placed in this encounter.    History of Present Illness:    Elizabeth [name] is a 78 y.o. female with a hx of nonobstructive CAD heart failure preserved ejection fraction bilateral carotid stenosis hypertension aortic stenosis with TAVR 2021 CKD stage III.  She was I really like this idea sending patient notes it is much more effective makes my life easier last seen 08/18/2023.  She was seen at [facility] ED on Saturday with palpitation and was found to have intermittent atrial tachycardia she was given extra IV Lopressor  I advised to increase her dose of beta-blocker and from my discussion with the ED staff she was not taken flecainide  and I directed them if she was not to have her restart her flecainide . Compliance with diet, lifestyle and medications: *** Past Medical History:  Diagnosis Date   Anxiety    extreme   B12 deficiency 01/03/2022   Benign hypertension with CKD (chronic kidney disease), stage II    Bilateral primary osteoarthritis of knee 12/11/2019   Breast cancer (HCC)    Pre-cancerous cell in Left breast   Chronic diastolic heart failure (HCC) 12/20/2018   Chronic pain of both knees 09/12/2017   [name] is a 78 y.o. female with low back pain, bilateral knee pain, due to multilevel multifactorial degenerative changes in the lumbar spine, and, significant multiple joint  osteoarthritis.   Last Assessment & Plan:  Today I will treat with bilateral knee injections.   Ms. Saline will return to the clinic in 6 months.  I will asses the efficacy of this treatment and make adjustments to her    CKD (chronic kidney disease), stage III (HCC)    Claustrophobia    Ductal carcinoma in situ (DCIS) of left breast 11/08/2017   Gastroesophageal reflux disease without esophagitis 01/02/2022   GERD (gastroesophageal reflux disease)    Grade III hemorrhoids 08/05/2022   Herpes zoster with complication 09/29/2022   History of DVT of lower extremity 01/02/2022   History of kidney stones    Hypertension 09/25/2018   Hyperuricemia    Immobility 01/09/2022   Insomnia 09/25/2018   Iron deficiency anemia 01/03/2022   Lower GI bleed 08/05/2022   Lumbar radiculopathy 11/28/2017   [name] is a 78 y.o. female with low back pain, lower extremity pain, on the left, in the setting of previous laminotomy in April 2019.   Last Assessment & Plan:  Today I will treat with current OTC medications..   [name] will return to the clinic on an as needed basis.  I will asses the efficacy of this treatment and make adjustments to her treatment plan as necessary.  Last Assessment &   Lumbar stenosis    Osteoarthrosis    Other chronic pain 09/29/2022   Polymyalgia rheumatica (HCC) 01/02/2022   Popliteal pain 05/27/2020   Primary  hypertension 09/25/2018   Rectal bleeding 08/05/2022   S/P TAVR (transcatheter aortic valve replacement) 05/28/2019   s/p TAVR with a 29 mm Medtronic Evolut Pro + via the TF approach by Dr. Wonda and Dr. Lucas    Severe aortic stenosis    Tachycardia 09/25/2018   Tarsal tunnel syndrome of right side 08/21/2019   Vitamin D  deficiency     Current Medications: No outpatient medications have been marked as taking for the 11/09/23 encounter (Appointment) with Monetta Redell PARAS, MD.      EKGs/Labs/Other Studies Reviewed:    The following studies were reviewed  today:  Cardiac Studies & Procedures   ______________________________________________________________________________________________ CARDIAC CATHETERIZATION  CARDIAC CATHETERIZATION 03/11/2019  Conclusion 1. Severe aortic stenosis with bulky calcification of the aortic valve leaflets, restricted mobility, and a mean transvalvular gradient of 43 mmHg. 2. Normal right heart pressures 3. Nonobstructive CAD, left-dominant, appropriate for medical therapy of coronary disease  Recommend: multidisciplinary heart team evaluation for aortic valve replacement  Findings Coronary Findings Diagnostic  Dominance: Left  Left Anterior Descending Prox LAD to Mid LAD lesion is 40% stenosed. There is diffuse nonobstructive mid LAD stenosis  Left Circumflex The vessel exhibits minimal luminal irregularities. Large, dominant circumflex with minimal irregularity, no significant stenosis.  Right Coronary Artery Not selectively injected.  Appears to be diminutive.  Intervention  No interventions have been documented.     ECHOCARDIOGRAM  ECHOCARDIOGRAM COMPLETE 08/05/2023  Narrative ECHOCARDIOGRAM REPORT    Patient Name:   Elizabeth [name] Date of Exam: 08/05/2023 Medical Rec #:  [id number]            Height:       65.0 in Accession #:    [id number]           Weight:       235.4 lb Date of Birth:  1945-12-24            BSA:          2.120 m Patient Age:    77 years             BP:           146/66 mmHg Patient Gender: F                    HR:           96 bpm. Exam Location:  Inpatient  Procedure: 2D Echo, Cardiac Doppler and Color Doppler (Both Spectral and Color Flow Doppler were utilized during procedure).  Indications:    Post TAVR evaluation Z95.2  History:        Patient has prior history of Echocardiogram examinations, most recent 08/07/2022. Aortic Valve Disease; Risk Factors:Hypertension. Aortic Valve: 29 mm Medtronic Evolut valve is present in the aortic position.  Procedure Date: 05/28/19.  Sonographer:    Jayson Gaskins Referring Phys: 46 Malli Falotico S CRENSHAW  IMPRESSIONS   1. Left ventricular ejection fraction, by estimation, is 55 to 60%. The left ventricle has normal function. The left ventricle has no regional wall motion abnormalities. The left ventricular internal cavity size was mildly dilated. Left ventricular diastolic parameters are indeterminate. 2. Right ventricular systolic function is normal. The right ventricular size is normal. 3. Left atrial size was moderately dilated. 4. The mitral valve is normal in structure. Trivial mitral valve regurgitation. Mild mitral stenosis. Moderate mitral annular calcification. 5. The aortic valve has been repaired/replaced. Aortic valve regurgitation is trivial. No aortic stenosis is present. There is a 29 mm Medtronic  Evolut valve present in the aortic position. Procedure Date: 05/28/19. 6. The inferior vena cava is normal in size with greater than 50% respiratory variability, suggesting right atrial pressure of 3 mmHg.  FINDINGS Left Ventricle: Left ventricular ejection fraction, by estimation, is 55 to 60%. The left ventricle has normal function. The left ventricle has no regional wall motion abnormalities. The left ventricular internal cavity size was mildly dilated. There is no left ventricular hypertrophy. Left ventricular diastolic parameters are indeterminate.  Right Ventricle: The right ventricular size is normal. Right ventricular systolic function is normal.  Left Atrium: Left atrial size was moderately dilated.  Right Atrium: Right atrial size was normal in size.  Pericardium: There is no evidence of pericardial effusion.  Mitral Valve: The mitral valve is normal in structure. Moderate mitral annular calcification. Trivial mitral valve regurgitation. Mild mitral valve stenosis. MV peak gradient, 10.3 mmHg. The mean mitral valve gradient is 5.0 mmHg.  Tricuspid Valve: The tricuspid valve is  normal in structure. Tricuspid valve regurgitation is not demonstrated. No evidence of tricuspid stenosis.  Aortic Valve: The aortic valve has been repaired/replaced. Aortic valve regurgitation is trivial. No aortic stenosis is present. Aortic valve mean gradient measures 9.0 mmHg. Aortic valve peak gradient measures 13.7 mmHg. Aortic valve area, by VTI measures 1.98 cm. There is a 29 mm Medtronic Evolut valve present in the aortic position. Procedure Date: 05/28/19.  Pulmonic Valve: The pulmonic valve was normal in structure. Pulmonic valve regurgitation is not visualized. No evidence of pulmonic stenosis.  Aorta: The aortic root is normal in size and structure.  Venous: The inferior vena cava is normal in size with greater than 50% respiratory variability, suggesting right atrial pressure of 3 mmHg.  IAS/Shunts: No atrial level shunt detected by color flow Doppler.   LEFT VENTRICLE PLAX 2D LVIDd:         5.40 cm LVIDs:         4.10 cm LV PW:         0.90 cm LV IVS:        1.00 cm LVOT diam:     1.80 cm LV SV:         70 LV SV Index:   33 LVOT Area:     2.54 cm   RIGHT VENTRICLE RV S prime:     12.20 cm/s TAPSE (M-mode): 2.2 cm  LEFT ATRIUM              Index        RIGHT ATRIUM           Index LA Vol (A2C):   112.0 ml 52.83 ml/m  RA Area:     18.10 cm LA Vol (A4C):   68.7 ml  32.40 ml/m  RA Volume:   49.00 ml  23.11 ml/m LA Biplane Vol: 88.6 ml  41.79 ml/m AORTIC VALVE AV Area (Vmax):    1.72 cm AV Area (Vmean):   1.71 cm AV Area (VTI):     1.98 cm AV Vmax:           185.00 cm/s AV Vmean:          142.000 cm/s AV VTI:            0.354 m AV Peak Grad:      13.7 mmHg AV Mean Grad:      9.0 mmHg LVOT Vmax:         125.00 cm/s LVOT Vmean:        95.700 cm/s  LVOT VTI:          0.275 m LVOT/AV VTI ratio: 0.78  MITRAL VALVE MV Area (PHT): 4.52 cm     SHUNTS MV Area VTI:   2.38 cm     Systemic VTI:  0.28 m MV Peak grad:  10.3 mmHg    Systemic Diam: 1.80 cm MV  Mean grad:  5.0 mmHg MV Vmax:       1.60 m/s MV Vmean:      102.5 cm/s MV Decel Time: 168 msec MV E velocity: 134.00 cm/s  Redell Shallow MD Electronically signed by Redell Shallow MD Signature Date/Time: 08/05/2023/1:18:50 PM    Final    MONITORS  LONG TERM MONITOR (3-14 DAYS) 05/19/2020  Narrative Patch Wear Time:  6 days and 17 hours (2022-02-15T11:45:51-0500 to 2022-02-22T05:42:06-498)  Patient had a min HR of 42 bpm, max HR of 171 bpm, and avg HR of 67 bpm. Predominant underlying rhythm was Sinus Rhythm. 19 Supraventricular Tachycardia runs occurred, the run with the fastest interval lasting 6 beats with a max rate of 171 bpm, the longest lasting 16 beats with an avg rate of 149 bpm. Idioventricular Rhythm was present. Isolated SVEs were occasional (3.5%, 22694), SVE Couplets were rare (<1.0%, 475), and SVE Triplets were rare (<1.0%, 28). Isolated VEs were frequent (5.5%, [id number]), VE Couplets were rare (<1.0%, 141), and VE Triplets were rare (<1.0%, 3). Ventricular Bigeminy and Trigeminy were present.  An event monitor was performed for 7 days to assess palpitation. Cardiac rhythm was sinus with average, minimum maximal heart rates of 67, 42 and 121 bpm. There were no pauses of 3 seconds or greater no episodes of second or third-degree AV node block or sinus node exit block. Ventricular ectopy was frequent with PVCs 141 couplets and 3 triplets.  There was one 6 beat run of PVCs accelerated idioventricular rhythm at a rate of 95 to 100 bpm. Supraventricular ectopy was occasional.  There were no episodes of atrial fibrillation or flutter.  There were 19 brief runs of APCs the longest 16 complexes at a rate of 150 bpm which appeared to be an organized SVT with a long RP interval and inverted P wave.  Conclusion occasional PVCs with couplets triplets and burden of 5.5% and occasional APCs with brief runs of APCs.   CT SCANS  CT CORONARY MORPH W/CTA COR W/SCORE 05/14/2019  Addendum  05/14/2019  5:21 PM ADDENDUM REPORT: 05/14/2019 17:18  CLINICAL DATA:  70 -year-old female with severe aortic stenosis being evaluated for a TAVR procedure.  EXAM: Cardiac TAVR CT  TECHNIQUE: The patient was scanned on a [employer]. A 120 kV retrospective scan was triggered in the descending thoracic aorta at 111 HU's. Gantry rotation speed was 250 msecs and collimation was .6 mm. No beta blockade or nitro were given. The 3D data set was reconstructed in 5% intervals of the R-R cycle. Systolic and diastolic phases were analyzed on a dedicated work station using MPR, MIP and VRT modes. The patient received 80 cc of contrast.  FINDINGS: Aortic Root:  Aortic valve: Tricuspid  Aortic valve calcium score: 2715  Aortic annulus:  Diameter: 25mm x 22mm  Perimeter: 74mm  Area: 423 mm^2  Calcifications: No calcifications  Coronary height: Min Left -19mm , Max Left -25mm ; Min Right - 13mm  Sinotubular height: Left cusp -26mm ; Right cusp - 18mm; Noncoronary cusp - 20mm  LVOT (as measured 3 mm below the annulus):  Diameter: 26mm x 21mm  Area: 373mm^2  Calcifications: Moderate LVOT calcification located 5mm inferior to annulus, beneath noncoronary cusp  Aortic sinus width: Left cusp - 35mm; Right cusp - 33mm; Noncoronary cusp -34mm;  Sinotubular junction width: 34mm x 32mm  Optimum Fluoroscopic Angle for Delivery: LAO 3 CRA 21  Cardiac:  Right atrium: Normal size  Right ventricle: Mildly dilated  Pulmonary arteries: Mildly dilated, measures 31mm in main PA  Pulmonary veins: Normal configuration  Left atrium: Moderately dilated  Left ventricle: Moderately dilated  Pericardium: Normal thickness  Coronary arteries: Calcium score 494 (88th percentile)  IMPRESSION: 1.  Tricuspid aortic valve, severely calcified (calcium score 2715)  2. Aortic annulus measures 25mm x 22mm in diameter with area 423 mm^2 and perimeter 74mm. No annular  calcifications. Annular measurements suitable for delivery of 23mm Edwards-Sapien 3 valve  3. Moderate LVOT calcification located 5mm inferior to annulus, beneath noncoronary cusp  4. Sufficient coronary to annulus distance, measures 19mm from left main to annulus, and 13mm from RCA to annulus  5. Optimum Fluoroscopic Angle for Delivery (centered on right coronary cusp): LAO 3 CRA 21  6.  Coronary calcium score 494 (88th percentile)   Electronically Signed By: Lonni Nanas MD On: 05/14/2019 17:18  Narrative EXAM: OVER-READ INTERPRETATION  CT CHEST  The following report is an over-read performed by radiologist Dr. Toribio Aye of [facility] on 05/14/2019. This over-read does not include interpretation of cardiac or coronary anatomy or pathology. The coronary calcium score/coronary CTA interpretation by the cardiologist is attached.  COMPARISON:  None.  FINDINGS: Extracardiac findings will be described separately under dictation for contemporaneously obtained CTA chest, abdomen and pelvis.  IMPRESSION: Please see separate dictation for contemporaneously obtained CTA chest, abdomen and pelvis 05/14/2019 for full description of relevant extracardiac findings.  Electronically Signed: By: Toribio Aye M.D. On: 05/14/2019 10:56     ______________________________________________________________________________________________          Recent Labs: 08/04/2023: B Natriuretic Peptide 37.4; Hemoglobin 11.0; Magnesium  2.0; Platelets 178 08/05/2023: ALT 11; TSH 3.684 08/07/2023: BUN 18; Creatinine, Ser 1.85; Potassium 4.7; Sodium 139  Recent Lipid Panel    Component Value Date/Time   CHOL 198 04/28/2020 1149   TRIG 162 (H) 04/28/2020 1149   HDL 55 04/28/2020 1149   CHOLHDL 3.6 04/28/2020 1149   LDLCALC 115 (H) 04/28/2020 1149    Physical Exam:    VS:  There were no vitals taken for this visit.    Wt Readings from Last 3 Encounters:   08/18/23 237 lb (107.5 kg)  08/07/23 237 lb 11.2 oz (107.8 kg)  08/04/23 235 lb (106.6 kg)     GEN: *** Well nourished, well developed in no acute distress HEENT: Normal NECK: No JVD; No carotid bruits LYMPHATICS: No lymphadenopathy CARDIAC: ***RRR, no murmurs, rubs, gallops RESPIRATORY:  Clear to auscultation without rales, wheezing or rhonchi  ABDOMEN: Soft, non-tender, non-distended MUSCULOSKELETAL:  No edema; No deformity  SKIN: Warm and dry NEUROLOGIC:  Alert and oriented x 3 PSYCHIATRIC:  Normal affect    Signed, Redell Leiter, MD  11/08/2023 8:40 AM    [facility]

## 2023-11-09 ENCOUNTER — Ambulatory Visit: Attending: Cardiology | Admitting: Cardiology

## 2023-11-09 DIAGNOSIS — I130 Hypertensive heart and chronic kidney disease with heart failure and stage 1 through stage 4 chronic kidney disease, or unspecified chronic kidney disease: Secondary | ICD-10-CM

## 2023-11-09 DIAGNOSIS — I2510 Atherosclerotic heart disease of native coronary artery without angina pectoris: Secondary | ICD-10-CM

## 2023-11-09 DIAGNOSIS — Z952 Presence of prosthetic heart valve: Secondary | ICD-10-CM

## 2023-11-09 DIAGNOSIS — I350 Nonrheumatic aortic (valve) stenosis: Secondary | ICD-10-CM

## 2023-11-09 DIAGNOSIS — Z79899 Other long term (current) drug therapy: Secondary | ICD-10-CM

## 2023-11-09 DIAGNOSIS — E782 Mixed hyperlipidemia: Secondary | ICD-10-CM

## 2023-11-09 DIAGNOSIS — I4719 Other supraventricular tachycardia: Secondary | ICD-10-CM

## 2023-11-10 ENCOUNTER — Telehealth: Payer: Self-pay | Admitting: Cardiology

## 2023-11-10 MED ORDER — FLECAINIDE ACETATE 50 MG PO TABS: 50.0000 mg | ORAL_TABLET | Freq: Two times a day (BID) | ORAL | 2 refills | Status: AC

## 2023-11-10 NOTE — Telephone Encounter (Signed)
*  STAT* If patient is at the pharmacy, call can be transferred to refill team.   1. Which medications need to be refilled? (please list name of each medication and dose if known) flecainide  (TAMBOCOR ) 50 MG tablet    2. Would you like to learn more about the convenience, safety, & potential cost savings by using the [facility]? No   3. Are you open to using the [facility] (Type [facility]. ). No  4. Which pharmacy/location (including street and city if local pharmacy) is medication to be sent to? [id number] - , [place] - [address]    5. Do they need a 30 day or 90 day supply? 90 day   Pt is out of medication

## 2023-11-10 NOTE — Telephone Encounter (Signed)
 Pt's medication was sent to pt's pharmacy as requested. Confirmation received.

## 2023-11-13 DIAGNOSIS — E782 Mixed hyperlipidemia: Secondary | ICD-10-CM | POA: Diagnosis not present

## 2023-11-13 DIAGNOSIS — G4700 Insomnia, unspecified: Secondary | ICD-10-CM | POA: Diagnosis not present

## 2023-11-20 NOTE — Telephone Encounter (Signed)
 Attempted to call the patient. Patient did not answer the phone and did not have the remote access code to enter on the phone.

## 2023-11-20 NOTE — Telephone Encounter (Signed)
 Patient also reported that ever since starting on the Metoprolol  medication she has been feeling very tired and was asking if there was another medication that she could switch to that would do the same thing. Please advise.

## 2023-11-20 NOTE — Telephone Encounter (Signed)
 Called the patient and she reported that she would like an appointment to see one of our providers. An appointment was scheduled on 11/22/23 at 3:00 pm with Dr. Liborio.

## 2023-11-21 ENCOUNTER — Other Ambulatory Visit: Payer: Self-pay

## 2023-11-21 DIAGNOSIS — I4719 Other supraventricular tachycardia: Secondary | ICD-10-CM

## 2023-11-21 DIAGNOSIS — K219 Gastro-esophageal reflux disease without esophagitis: Secondary | ICD-10-CM | POA: Insufficient documentation

## 2023-11-21 NOTE — Telephone Encounter (Signed)
 Pt called in asking if she should keep her appt with Dr. Liborio tomorrow or cancel and get set up for EP consult. Please advise.

## 2023-11-21 NOTE — Telephone Encounter (Signed)
 Called the patient and informed her that Dr. Liborio would still like for her to keep her appointment on 11/22/23 at 3:00 pm with him. Patient verbalized understanding and had no further questions at this time.

## 2023-11-21 NOTE — Telephone Encounter (Signed)
 Called the patient and informed her of Dr. Madireddy's recommendation below:  hx of nonobstructive CAD, (HFpEF) heart failure with preserved ejection fraction, AS s/p TAVR and parox atrial tachycardia.  Multiple messages. From what I can tell she is having symptomatic episodes of paroxysmal atrial tachycardia.  Maintained on flecainide  and low-dose metoprolol  succinate. Her current dose of metoprolol  succinate I could see listed is 12.5 mg once daily.  This is the smallest dose and should be continued with ongoing therapy with flecainide . Please schedule her for a visit with electrophysiologist to further discuss management of paroxysmal atrial tachycardia symptomatic episodes. Would likely be a candidate to be transitioned off flecainide  plus metoprolol  to an alternative antiarrhythmic agent. No changes to the medications at this time. Thank you  Patient verbalized understanding and had no further questions at this time. A referral was placed via Epic for the patient to have an appointment with Dr. Inocencio.

## 2023-11-22 ENCOUNTER — Ambulatory Visit

## 2023-11-22 VITALS — BP 130/80 | HR 80 | Ht 65.0 in | Wt 222.4 lb

## 2023-11-22 DIAGNOSIS — R0609 Other forms of dyspnea: Secondary | ICD-10-CM | POA: Diagnosis not present

## 2023-11-22 DIAGNOSIS — I10 Essential (primary) hypertension: Secondary | ICD-10-CM | POA: Diagnosis not present

## 2023-11-22 DIAGNOSIS — I4719 Other supraventricular tachycardia: Secondary | ICD-10-CM

## 2023-11-22 DIAGNOSIS — I5032 Chronic diastolic (congestive) heart failure: Secondary | ICD-10-CM | POA: Diagnosis not present

## 2023-11-22 DIAGNOSIS — Z952 Presence of prosthetic heart valve: Secondary | ICD-10-CM

## 2023-11-22 MED ORDER — FUROSEMIDE 40 MG PO TABS: 40.0000 mg | ORAL_TABLET | Freq: Every day | ORAL | 3 refills | Status: AC

## 2023-11-22 NOTE — Progress Notes (Signed)
 Cardiology Consultation:    Date:  11/22/2023   ID:  Elizabeth [name], Elizabeth [name] 1945-07-23, MRN [id number]  PCP:  Elizabeth Hun, MD  Cardiologist:  Elizabeth SAUNDERS Sharine Cadle, MD   Referring MD: Elizabeth Hun, MD   No chief complaint on file.    ASSESSMENT AND PLAN:   [name] is a 78 year old woman history of nonobstructive coronary artery disease on cardiac cath December 2020 done for severe aortic stenosis evaluation, s/p TAVR Evolut 29 mm valve March 2021, mild bilateral carotid artery disease, chronic heart failure with preserved ejection fraction, hypertension, hyperlipidemia, mild mitral stenosis on echocardiogram 11/05/2023 with LVEF 55 to 60% and moderately dilated left atrium, CKD stage III, GERD, history of breast cancer, history of DVT and polymyalgia rheumatica. Incidentally diagnosed with paroxysmal atrial tachycardia at [facility] and patient treated 08/04/2023 through 08/07/2023 in the setting of atrial tachycardia not started on anticoagulation and was started on flecainide  50 mg twice daily along with Toprol  XL 12.5 mg daily. Transthoracic echocardiogram during inpatient stay 08/05/2023 noted LVEF 55 to 60% diastolic function indeterminate given the underlying atrial rhythm, dilated atria, trace MR, bioprosthetic aortic valve with trace aortic insufficiency, mean gradient 9 mmHg and aortic valve V-max 1.85 m/s.  Mild mitral stenosis with mean gradient 5 mmHg  Here for follow-up visit.  Problem List Items Addressed This Visit     S/P TAVR (transcatheter aortic valve replacement)   Reassess TAVR valve with transthoracic echocardiogram given her progressive symptoms of dyspnea on exertion and with underlying mitral stenosis reported on echocardiogram in May 2025.       Atrial tachycardia (HCC)   Paroxysmal atrial tachycardia in May 2025. Currently in sinus rhythm. Was empirically started on flecainide  and metoprolol  for rhythm control. Has been tolerating the medication  well.  EKG in the clinic today with prolonged PR interval in comparison to prior EKGs. She has not had follow-up treadmill EKG stress test. Functional status limited to obtain a treadmill EKG stress test at this time.  She has upcoming visit with electrophysiologist Dr. Inocencio to review long-term management for atrial tachycardia. Will defer to electrophysiologist with regards to long-term decision about continuing flecainide  or not as I have some doubts about long-term use given her history of aortic stenosis with left ventricular hypertrophy and mild nonobstructive disease and with current symptoms of dyspnea on exertion.  Also she was not started on anticoagulation during her inpatient stay after inpatient cardiology evaluation and review with electrophysiologist at the time as the rhythm was felt to be atrial tachycardia. Will discuss long-term need for anticoagulation to electrophysiologist at the upcoming visit.       Chronic diastolic heart failure (HCC) - Primary   Symptoms highly suggestive of chronic diastolic heart failure. She has LVH from prior history of severe aortic stenosis. She also has mitral stenosis which is mild in intensity.  This is likely contributing to her symptoms.  Advised her to reduce salt intake to below 2 g/day. Titrate up furosemide  dose to 40 mg once daily.        Relevant Orders   Cardiac Stress Test: Informed Consent Details: Physician/Practitioner Attestation; Transcribe to consent form and obtain patient signature   Essential hypertension   Well-controlled.      Relevant Orders   EKG 12-Lead (Completed)   Dyspnea on exertion   Appears related to diastolic heart failure with fluid overload. Also has mitral stenosis which is mild likely not the underlying factor here. Rhythm appears stable today at  office visit.  Will rule out any significant anginal component by obtaining a cardiac PET stress test.  Next  Other differential would be  deconditioning.       Relevant Orders   Cardiac Stress Test: Informed Consent Details: Physician/Practitioner Attestation; Transcribe to consent form and obtain patient signature      History of Present Illness:    Elizabeth [name] is a 78 y.o. female who is being seen today for follow-up visit. PCP Elizabeth Hun, MD. Last visit with our office was 08/18/2023 with Delon Hoover, NP-C.  Has history of nonobstructive coronary artery disease on cardiac cath December 2020 done for severe aortic stenosis evaluation, s/p TAVR Evolut 29 mm valve March 2021, mild bilateral carotid artery disease, chronic heart failure with preserved ejection fraction, hypertension, hyperlipidemia, mild mitral stenosis on echocardiogram 11/05/2023 with LVEF 55 to 60% and moderately dilated left atrium, CKD stage III, GERD, history of breast cancer, history of DVT and polymyalgia rheumatica. Incidentally diagnosed with paroxysmal atrial tachycardia at [facility] and patient treated 08/04/2023 through 08/07/2023 in the setting of atrial tachycardia not started on anticoagulation and was started on flecainide  50 mg twice daily along with Toprol  XL 12.5 mg daily. Transthoracic echocardiogram during inpatient stay 08/05/2023 noted LVEF 55 to 60% diastolic function indeterminate given the underlying atrial rhythm, dilated atria, trace MR, bioprosthetic aortic valve with trace aortic insufficiency, mean gradient 9 mmHg and aortic valve V-max 1.85 m/s.  Mild mitral stenosis with mean gradient 5 mmHg    She is here for the visit today accompanied by her daughter. Lives by herself and manages her own medications.  Mentions over the last few months she feels she has been out of breath gets easily winded walking. Denies any significant chest pain at rest but feels worse in comparison to how things were even when she had severe aortic stenosis back in 2020.  Denies any palpitation, lightheadedness or syncopal  episodes.  Mentions when she received steroids recently for gout flareups she felt the best but once off steroids she feels poorly.  Not very diligent about salt restriction. On a low-dose furosemide  20 mg once daily.  EKG in the clinic today shows sinus rhythm with heart rate 80/min, prolonged PR interval 264 ms, QRS duration 96 ms low voltage.  No ischemic changes.Comparison to prior EKG from August 18, 2023 PR is mildly prolonged  Past Medical History:  Diagnosis Date   Anxiety    extreme   Atrial tachycardia (HCC) 08/04/2023   B12 deficiency 01/03/2022   Benign hypertension with CKD (chronic kidney disease), stage II    Bilateral primary osteoarthritis of knee 12/11/2019   Breast cancer (HCC)    Pre-cancerous cell in Left breast   Chronic diastolic heart failure (HCC) 12/20/2018   Chronic heart failure with preserved ejection fraction (HFpEF) (HCC) 12/20/2018   Chronic pain of both knees 09/12/2017   Ms. Akansha Wyche is a 78 y.o. female with low back pain, bilateral knee pain, due to multilevel multifactorial degenerative changes in the lumbar spine, and, significant multiple joint osteoarthritis.   Last Assessment & Plan:  Today I will treat with bilateral knee injections.   Ms. Schneck will return to the clinic in 6 months.  I will asses the efficacy of this treatment and make adjustments to her    CKD (chronic kidney disease), stage III (HCC)    Claustrophobia    Gastroesophageal reflux disease without esophagitis 01/02/2022   GERD (gastroesophageal reflux disease)    Grade  III hemorrhoids 08/05/2022   Herpes zoster with complication 09/29/2022   History of DVT of lower extremity 01/02/2022   History of kidney stones    Hypertension 09/25/2018   Hyperuricemia    Immobility 01/09/2022   Insomnia 09/25/2018   Iron deficiency anemia 01/03/2022   Lower GI bleed 08/05/2022   Lumbar radiculopathy 11/28/2017   Ms. Tenille Morrill is a 78 y.o. female with low back pain, lower extremity pain,  on the left, in the setting of previous laminotomy in April 2019.   Last Assessment & Plan:  Today I will treat with current OTC medications..   Ms. Calia will return to the clinic on an as needed basis.  I will asses the efficacy of this treatment and make adjustments to her treatment plan as necessary.  Last Assessment &   Lumbar stenosis    Osteoarthrosis    Other chronic pain 09/29/2022   PAT (paroxysmal atrial tachycardia) (HCC) 11/22/2022   Polymyalgia rheumatica (HCC) 01/02/2022   Popliteal pain 05/27/2020   Primary hypertension 09/25/2018   Rectal bleeding 08/05/2022   S/P TAVR (transcatheter aortic valve replacement) 05/28/2019   s/p TAVR with a 29 mm Medtronic Evolut Pro + via the TF approach by Dr. Wonda and Dr. Lucas    Severe aortic stenosis    Tachycardia 09/25/2018   Tarsal tunnel syndrome of right side 08/21/2019   Vitamin D  deficiency     Past Surgical History:  Procedure Laterality Date   ABDOMINAL HYSTERECTOMY     ABDOMINAL HYSTERECTOMY     APPENDECTOMY     BACK SURGERY     BREAST BIOPSY Left 10/2017   CARDIAC CATHETERIZATION  03/11/2019   CHOLECYSTECTOMY     GALLBLADDER SURGERY     INTRAOPERATIVE TRANSTHORACIC ECHOCARDIOGRAM  05/28/2019   Procedure: Intraoperative Transthoracic Echocardiogram;  Surgeon: Wonda Sharper, MD;  Location: [facility] OR;  Service: Open Heart Surgery;;   IR RADIOLOGY PERIPHERAL GUIDED IV START  05/14/2019   IR US  GUIDE VASC ACCESS RIGHT  05/14/2019   LUMBAR LAMINECTOMY/DECOMPRESSION MICRODISCECTOMY N/A 06/12/2017   Procedure: LAMINECTOMY AND FORAMINOTOMY LUMBAR THREE- LUMBAR FOUR, LUMBAR FOUR- LUMBAR FIVE;  Surgeon: Mavis Purchase, MD;  Location: [facility] OR;  Service: Neurosurgery;  Laterality: N/A;   RIGHT/LEFT HEART CATH AND CORONARY ANGIOGRAPHY N/A 03/11/2019   Procedure: RIGHT/LEFT HEART CATH AND CORONARY ANGIOGRAPHY;  Surgeon: Wonda Sharper, MD;  Location: [facility] INVASIVE CV LAB;  Service: Cardiovascular;  Laterality: N/A;   TONSILLECTOMY      TRANSCATHETER AORTIC VALVE REPLACEMENT, TRANSFEMORAL N/A 05/28/2019   Procedure: TRANSCATHETER AORTIC VALVE REPLACEMENT, TRANSFEMORAL;  Surgeon: Wonda Sharper, MD;  Location: [facility] OR;  Service: Open Heart Surgery;  Laterality: N/A;    Current Medications: Current Meds  Medication Sig   allopurinol (ZYLOPRIM) 100 MG tablet Take 50 mg by mouth as needed (gout flare up).   aspirin  EC 81 MG tablet Take 81 mg by mouth daily. Swallow whole.   Calcium-Magnesium -Vitamin D  (CALCIUM 1200+D3 PO) Take 2 tablets by mouth daily.   flecainide  (TAMBOCOR ) 50 MG tablet Take 1 tablet (50 mg total) by mouth 2 (two) times daily.   furosemide  (LASIX ) 20 MG tablet TAKE 1 TABLET BY MOUTH DAILY   metoprolol  succinate (TOPROL  XL) 25 MG 24 hr tablet Take 0.5 tablets (12.5 mg total) by mouth daily. (Patient taking differently: Take 25 mg by mouth daily.)   pantoprazole  (PROTONIX ) 40 MG tablet Take 40 mg by mouth daily before breakfast.   tamoxifen  (NOLVADEX ) 20 MG tablet Take 1 tablet (20 mg total)  by mouth daily.     Allergies:   Other, Penicillins, Atorvastatin, and Codeine   Social History   Socioeconomic History   Marital status: Married    Spouse name: Not on file   Number of children: 2   Years of education: 12   Highest education level: Not on file  Occupational History   Occupation: child care  Tobacco Use   Smoking status: Never    Passive exposure: Never   Smokeless tobacco: Never  Vaping Use   Vaping status: Never Used  Substance and Sexual Activity   Alcohol use: No   Drug use: No   Sexual activity: Not on file  Other Topics Concern   Not on file  Social History Narrative   Lives with husband in a one story home.  Has 2 children.  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  Education: high school.     Social Drivers of Corporate investment banker Strain: Not on file  Food Insecurity: No Food Insecurity (08/05/2023)   Hunger Vital Sign    Worried About Running Out of Food in the Last Year:  Never true    Ran Out of Food in the Last Year: Never true  Transportation Needs: No Transportation Needs (08/05/2023)   PRAPARE - Administrator, Civil Service (Medical): No    Lack of Transportation (Non-Medical): No  Physical Activity: Not on file  Stress: Not on file  Social Connections: Unknown (08/05/2023)   Social Connection and Isolation Panel    Frequency of Communication with Friends and Family: More than three times a week    Frequency of Social Gatherings with Friends and Family: More than three times a week    Attends Religious Services: Patient declined    Database administrator or Organizations: Patient declined    Attends Banker Meetings: Patient declined    Marital Status: Widowed     Family History: The patient's family history includes Alzheimer's disease in her mother; Aneurysm in her father; Stomach cancer in her brother. ROS:   Please see the history of present illness.    All 14 point review of systems negative except as described per history of present illness.  EKGs/Labs/Other Studies Reviewed:    The following studies were reviewed today:   EKG:  EKG Interpretation Date/Time:  Wednesday November 22 2023 15:05:04 EDT Ventricular Rate:  80 PR Interval:  264 QRS Duration:  96 QT Interval:  398 QTC Calculation: 459 R Axis:   74  Text Interpretation: Sinus rhythm with 1st degree A-V block Low voltage QRS Borderline ECG When compared with ECG of 18-Aug-2023 14:53, PR interval has increased Questionable change in QRS axis Confirmed by Liborio Hai reddy [phone]) on 11/22/2023 3:47:15 PM    Recent Labs: 08/04/2023: B Natriuretic Peptide 37.4; Hemoglobin 11.0; Magnesium  2.0; Platelets 178 08/05/2023: ALT 11; TSH 3.684 08/07/2023: BUN 18; Creatinine, Ser 1.85; Potassium 4.7; Sodium 139  Recent Lipid Panel    Component Value Date/Time   CHOL 198 04/28/2020 1149   TRIG 162 (H) 04/28/2020 1149   HDL 55 04/28/2020 1149   CHOLHDL  3.6 04/28/2020 1149   LDLCALC 115 (H) 04/28/2020 1149    Physical Exam:    VS:  BP 130/80   Pulse 80   Ht 5' 5 (1.651 m)   Wt 222 lb 6.4 oz (100.9 kg)   SpO2 95%   BMI 37.01 kg/m     Wt Readings from Last 3 Encounters:  11/22/23  222 lb 6.4 oz (100.9 kg)  08/18/23 237 lb (107.5 kg)  08/07/23 237 lb 11.2 oz (107.8 kg)     GENERAL:  Well nourished, well developed in no acute distress NECK: No JVD; No carotid bruits CARDIAC: RRR, S1 and S2 present, 3/6 ejection systolic murmur best heard right upper sternal border. CHEST:  Clear to auscultation without rales, wheezing or rhonchi  Extremities: 1+ bilateral pitting pedal edema extending up to the knees. Pulses bilaterally symmetric with radial 2+ and dorsalis pedis 2+ NEUROLOGIC:  Alert and oriented x 3  Medication Adjustments/Labs and Tests Ordered: Current medicines are reviewed at length with the patient today.  Concerns regarding medicines are outlined above.  Orders Placed This Encounter  Procedures   Cardiac Stress Test: Informed Consent Details: Physician/Practitioner Attestation; Transcribe to consent form and obtain patient signature   EKG 12-Lead   No orders of the defined types were placed in this encounter.   Signed, Norely Schlick reddy Josefa Syracuse, MD, MPH, [facility]. 11/22/2023 4:15 PM    [facility]

## 2023-11-22 NOTE — Patient Instructions (Signed)
 Medication Instructions:  Your physician has recommended you make the following change in your medication:   START: Lasix  40 mg daily  *If you need a refill on your cardiac medications before your next appointment, please call your pharmacy*  Lab Work: None If you have labs (blood work) drawn today and your tests are completely normal, you will receive your results only by: MyChart Message (if you have MyChart) OR A paper copy in the mail If you have any lab test that is abnormal or we need to change your treatment, we will call you to review the results.  Testing/Procedures: Your physician has requested that you have an echocardiogram. Echocardiography is a painless test that uses sound waves to create images of your heart. It provides your doctor with information about the size and shape of your heart and how well your heart's chambers and valves are working. This procedure takes approximately one hour. There are no restrictions for this procedure. Please do NOT wear cologne, perfume, aftershave, or lotions (deodorant is allowed). Please arrive 15 minutes prior to your appointment time.  Please note: We ask at that you not bring children with you during ultrasound (echo/ vascular) testing. Due to room size and safety concerns, children are not allowed in the ultrasound rooms during exams. Our front office staff cannot provide observation of children in our lobby area while testing is being conducted. An adult accompanying a patient to their appointment will only be allowed in the ultrasound room at the discretion of the ultrasound technician under special circumstances. We apologize for any inconvenience.  Cardiac PET Stress Test  Follow-Up: At [facility], you and your health needs are our priority.  As part of our continuing mission to provide you with exceptional heart care, our providers are all part of one team.  This team includes your primary Cardiologist (physician) and  Advanced Practice Providers or APPs (Physician Assistants and Nurse Practitioners) who all work together to provide you with the care you need, when you need it.  Your next appointment:   2 month(s)  Provider:   Alean Kobus, MD    We recommend signing up for the patient portal called MyChart.  Sign up information is provided on this After Visit Summary.  MyChart is used to connect with patients for Virtual Visits (Telemedicine).  Patients are able to view lab/test results, encounter notes, upcoming appointments, etc.  Non-urgent messages can be sent to your provider as well.   To learn more about what you can do with MyChart, go to [email or web address].   Other Instructions None

## 2023-11-22 NOTE — Assessment & Plan Note (Signed)
 Symptoms highly suggestive of chronic diastolic heart failure. She has LVH from prior history of severe aortic stenosis. She also has mitral stenosis which is mild in intensity.  This is likely contributing to her symptoms.  Advised her to reduce salt intake to below 2 g/day. Titrate up furosemide  dose to 40 mg once daily.

## 2023-11-22 NOTE — Assessment & Plan Note (Signed)
 Paroxysmal atrial tachycardia in May 2025. Currently in sinus rhythm. Was empirically started on flecainide  and metoprolol  for rhythm control. Has been tolerating the medication well.  EKG in the clinic today with prolonged PR interval in comparison to prior EKGs. She has not had follow-up treadmill EKG stress test. Functional status limited to obtain a treadmill EKG stress test at this time.  She has upcoming visit with electrophysiologist Dr. Inocencio to review long-term management for atrial tachycardia. Will defer to electrophysiologist with regards to long-term decision about continuing flecainide  or not as I have some doubts about long-term use given her history of aortic stenosis with left ventricular hypertrophy and mild nonobstructive disease and with current symptoms of dyspnea on exertion.  Also she was not started on anticoagulation during her inpatient stay after inpatient cardiology evaluation and review with electrophysiologist at the time as the rhythm was felt to be atrial tachycardia. Will discuss long-term need for anticoagulation to electrophysiologist at the upcoming visit.

## 2023-11-22 NOTE — Assessment & Plan Note (Signed)
 Well controlled

## 2023-11-22 NOTE — Assessment & Plan Note (Signed)
 Reassess TAVR valve with transthoracic echocardiogram given her progressive symptoms of dyspnea on exertion and with underlying mitral stenosis reported on echocardiogram in May 2025.

## 2023-11-22 NOTE — Assessment & Plan Note (Addendum)
 Appears related to diastolic heart failure with fluid overload. Also has mitral stenosis which is mild likely not the underlying factor here. Rhythm appears stable today at office visit.  Will rule out any significant anginal component by obtaining a cardiac PET stress test.  Next  Other differential would be deconditioning.

## 2023-12-04 ENCOUNTER — Ambulatory Visit: Admitting: Cardiology

## 2023-12-12 ENCOUNTER — Ambulatory Visit

## 2023-12-13 DIAGNOSIS — E782 Mixed hyperlipidemia: Secondary | ICD-10-CM | POA: Diagnosis not present

## 2023-12-13 DIAGNOSIS — G4700 Insomnia, unspecified: Secondary | ICD-10-CM | POA: Diagnosis not present

## 2023-12-18 ENCOUNTER — Encounter: Payer: Self-pay | Admitting: Cardiology

## 2023-12-18 ENCOUNTER — Ambulatory Visit: Attending: Cardiology | Admitting: Cardiology

## 2023-12-18 VITALS — BP 134/86 | HR 62 | Ht 65.0 in | Wt 225.0 lb

## 2023-12-18 DIAGNOSIS — Z952 Presence of prosthetic heart valve: Secondary | ICD-10-CM

## 2023-12-18 DIAGNOSIS — I4719 Other supraventricular tachycardia: Secondary | ICD-10-CM

## 2023-12-18 DIAGNOSIS — I10 Essential (primary) hypertension: Secondary | ICD-10-CM | POA: Diagnosis not present

## 2023-12-18 NOTE — Progress Notes (Signed)
  Electrophysiology Office Note:   Date:  12/18/2023  ID:  Elizabeth, [name] [birth year], MRN [id number]  Primary Cardiologist: Redell Leiter, MD Primary Heart Failure: None Electrophysiologist: None      History of Present Illness:   Elizabeth [name] is a 78 y.o. female with h/o nonobstructive coronary disease, diastolic heart failure, hypertension, aortic stenosis post TAVR, CKD stage III, breast cancer, DVT, polymyalgia rheumatica seen today for  for Electrophysiology evaluation of atrial tachycardia at the request of Elizabeth [name].    She was initially seen in 2020 with diastolic heart failure and severe aortic stenosis.  She underwent TAVR in 2021.  She was previously hospitalized with a GI bleed.  She had an episode of ectopic atrial tachycardia and was started on flecainide .  She was again admitted to the hospital 08/04/2023 with atrial tachycardia.  She was discharged on flecainide  and metoprolol .  Since her discharge she has done well.  She has noted no further episodes of tachycardia.  Her heart rates are in the 60s to 80s the vast majority of the time.  She has no chest pain and no shortness of breath.  Review of systems complete and found to be negative unless listed in HPI.   EP Information / Studies Reviewed:    EKG is not ordered today. EKG from 11/22/2023 reviewed which showed sinus rhythm        Risk Assessment/Calculations:            Physical Exam:   VS:  BP 134/86   Pulse 62   Ht 5' 5 (1.651 m)   Wt 225 lb (102.1 kg)   SpO2 96%   BMI 37.44 kg/m    Wt Readings from Last 3 Encounters:  12/18/23 225 lb (102.1 kg)  11/22/23 222 lb 6.4 oz (100.9 kg)  08/18/23 237 lb (107.5 kg)     GEN: Well nourished, well developed in no acute distress NECK: No JVD; No carotid bruits CARDIAC: Regular rate and rhythm, no murmurs, rubs, gallops RESPIRATORY:  Clear to auscultation without rales, wheezing or rhonchi  ABDOMEN: Soft, non-tender,  non-distended EXTREMITIES:  No edema; No deformity   ASSESSMENT AND PLAN:    1.  Atrial tachycardia: On flecainide  and metoprolol .  She has not had any further episodes of atrial tachycardia since her discharge from the hospital.  She is happy with her control.  Elizabeth [name] continue with current management.  2.  Hypertension: Well-controlled  3.  Severe aortic stenosis: Post TAVR.  Plan per primary cardiology  4.  Coronary artery disease: No current chest pain.  Continue management per primary cardiology.  Nonobstructive by catheterization in 2020.  Follow up with Dr. Inocencio as needed   Signed, Elizabeth Curro Gladis Inocencio, MD

## 2023-12-19 ENCOUNTER — Ambulatory Visit

## 2023-12-19 DIAGNOSIS — I5032 Chronic diastolic (congestive) heart failure: Secondary | ICD-10-CM

## 2023-12-19 DIAGNOSIS — I10 Essential (primary) hypertension: Secondary | ICD-10-CM | POA: Diagnosis not present

## 2023-12-19 DIAGNOSIS — R0609 Other forms of dyspnea: Secondary | ICD-10-CM

## 2023-12-19 DIAGNOSIS — I4719 Other supraventricular tachycardia: Secondary | ICD-10-CM

## 2023-12-19 DIAGNOSIS — Z952 Presence of prosthetic heart valve: Secondary | ICD-10-CM | POA: Diagnosis not present

## 2023-12-19 LAB — ECHOCARDIOGRAM COMPLETE
AR max vel: 2 cm2
AV Area VTI: 2.1 cm2
AV Area mean vel: 2.01 cm2
AV Mean grad: 16.4 mmHg
AV Peak grad: 31.1 mmHg
Ao pk vel: 2.79 m/s
Area-P 1/2: 3.13 cm2
MV M vel: 4.59 m/s
MV Peak grad: 84.3 mmHg
P 1/2 time: 533 ms
S' Lateral: 3.7 cm

## 2023-12-20 DIAGNOSIS — M1811 Unilateral primary osteoarthritis of first carpometacarpal joint, right hand: Secondary | ICD-10-CM | POA: Diagnosis not present

## 2023-12-20 DIAGNOSIS — M353 Polymyalgia rheumatica: Secondary | ICD-10-CM | POA: Diagnosis not present

## 2023-12-20 DIAGNOSIS — M7501 Adhesive capsulitis of right shoulder: Secondary | ICD-10-CM | POA: Diagnosis not present

## 2023-12-27 DIAGNOSIS — M109 Gout, unspecified: Secondary | ICD-10-CM | POA: Diagnosis not present

## 2024-01-03 DIAGNOSIS — M170 Bilateral primary osteoarthritis of knee: Secondary | ICD-10-CM | POA: Diagnosis not present

## 2024-01-03 DIAGNOSIS — E782 Mixed hyperlipidemia: Secondary | ICD-10-CM | POA: Diagnosis not present

## 2024-01-03 DIAGNOSIS — I129 Hypertensive chronic kidney disease with stage 1 through stage 4 chronic kidney disease, or unspecified chronic kidney disease: Secondary | ICD-10-CM | POA: Diagnosis not present

## 2024-01-23 NOTE — Progress Notes (Deleted)
 Cardiology Office Note:    Date:  01/23/2024   ID:  Elizabeth [name], Andre 08/22/1945, MRN [id number]  PCP:  Trinidad Hun, MD  Cardiologist:  Redell Leiter, MD    Referring MD: Trinidad Hun, MD    ASSESSMENT:    No diagnosis found. PLAN:    In order of problems listed above:  ***   Next appointment: ***   Medication Adjustments/Labs and Tests Ordered: Current medicines are reviewed at length with the patient today.  Concerns regarding medicines are outlined above.  No orders of the defined types were placed in this encounter.  No orders of the defined types were placed in this encounter.    History of Present Illness:    Elizabeth [name] is a 78 y.o. female with a hx of severe symptomatic aortic stenosis with TAVR 05/28/2019 hypertensive heart disease with heart failure and stage II CKD mild nonobstructive CAD hyper lipidemia and palpitation with PVCs 5.5% burden on event monitor  last seen 10/03/2022.  She had a TAVR surveillance echocardiogram performed 12/19/2022 there is no evidence of valvular obstruction and she had persistent mild paravalvular regurgitation unchanged left ventricular functions normal EF 60 to 65% there is no hypertrophy GLS was normal -18% the left atrium was severely enlarged and there is moderate mitral regurgitation. Compliance with diet, lifestyle and medications: *** Past Medical History:  Diagnosis Date   Anxiety    extreme   Atrial tachycardia 08/04/2023   B12 deficiency 01/03/2022   Benign hypertension with CKD (chronic kidney disease), stage II    Bilateral primary osteoarthritis of knee 12/11/2019   Breast cancer (HCC)    Pre-cancerous cell in Left breast   Chronic diastolic heart failure (HCC) 12/20/2018   Chronic heart failure with preserved ejection fraction (HFpEF) (HCC) 12/20/2018   Chronic pain of both knees 09/12/2017   [name] is a 78 y.o. female with low back pain, bilateral knee pain, due to multilevel  multifactorial degenerative changes in the lumbar spine, and, significant multiple joint osteoarthritis.   Last Assessment & Plan:  Today I will treat with bilateral knee injections.   Ms. Noren will return to the clinic in 6 months.  I will asses the efficacy of this treatment and make adjustments to her    CKD (chronic kidney disease), stage III (HCC)    Claustrophobia    Essential hypertension 09/25/2018   Gastroesophageal reflux disease without esophagitis 01/02/2022   GERD (gastroesophageal reflux disease)    Grade III hemorrhoids 08/05/2022   Herpes zoster with complication 09/29/2022   History of DVT of lower extremity 01/02/2022   History of kidney stones    Hypertension 09/25/2018   Hyperuricemia    Immobility 01/09/2022   Insomnia 09/25/2018   Iron deficiency anemia 01/03/2022   Lower GI bleed 08/05/2022   Lumbar radiculopathy 11/28/2017   [name] is a 78 y.o. female with low back pain, lower extremity pain, on the left, in the setting of previous laminotomy in April 2019.   Last Assessment & Plan:  Today I will treat with current OTC medications..   Ms. Carvey will return to the clinic on an as needed basis.  I will asses the efficacy of this treatment and make adjustments to her treatment plan as necessary.  Last Assessment &   Lumbar stenosis    Osteoarthrosis    Other chronic pain 09/29/2022   PAT (paroxysmal atrial tachycardia) 11/22/2022   Polymyalgia rheumatica 01/02/2022   Popliteal pain 05/27/2020  Primary hypertension 09/25/2018   Rectal bleeding 08/05/2022   S/P TAVR (transcatheter aortic valve replacement) 05/28/2019   s/p TAVR with a 29 mm Medtronic Evolut Pro + via the TF approach by Dr. Wonda and Dr. Lucas    Severe aortic stenosis    Tachycardia 09/25/2018   Tarsal tunnel syndrome of right side 08/21/2019   Vitamin D  deficiency     Current Medications: No outpatient medications have been marked as taking for the 01/24/24 encounter (Appointment) with  Monetta Redell PARAS, MD.      EKGs/Labs/Other Studies Reviewed:    The following studies were reviewed today:  Cardiac Studies & Procedures   ______________________________________________________________________________________________ CARDIAC CATHETERIZATION  CARDIAC CATHETERIZATION 03/11/2019  Conclusion 1. Severe aortic stenosis with bulky calcification of the aortic valve leaflets, restricted mobility, and a mean transvalvular gradient of 43 mmHg. 2. Normal right heart pressures 3. Nonobstructive CAD, left-dominant, appropriate for medical therapy of coronary disease  Recommend: multidisciplinary heart team evaluation for aortic valve replacement  Findings Coronary Findings Diagnostic  Dominance: Left  Left Anterior Descending Prox LAD to Mid LAD lesion is 40% stenosed. There is diffuse nonobstructive mid LAD stenosis  Left Circumflex The vessel exhibits minimal luminal irregularities. Large, dominant circumflex with minimal irregularity, no significant stenosis.  Right Coronary Artery Not selectively injected.  Appears to be diminutive.  Intervention  No interventions have been documented.     ECHOCARDIOGRAM  ECHOCARDIOGRAM COMPLETE 12/19/2023  Narrative ECHOCARDIOGRAM REPORT    Patient Name:   Elizabeth [name] Date of Exam: 12/19/2023 Medical Rec #:  [id number]            Height:       65.0 in Accession #:    [id number]           Weight:       225.0 lb Date of Birth:  Nov 24, 1945            BSA:          2.080 m Patient Age:    78 years             BP:           134/86 mmHg Patient Gender: F                    HR:           58 bpm. Exam Location:  [facility]  Procedure: 2D Echo, Cardiac Doppler, Color Doppler and Strain Analysis (Both Spectral and Color Flow Doppler were utilized during procedure).  Indications:    Chronic diastolic heart failure (HCC) [I50.32 (ICD-10-CM)], Essential hypertension [I10 (ICD-10-CM)], Atrial tachycardia [I47.19  (ICD-10-CM)], Dyspnea on exertion [R06.09 (ICD-10-CM)], S/P TAVR (transcatheter aortic valve replacement) [S04.7 (ICD-10-CM)]  History:        Patient has prior history of Echocardiogram examinations, most recent 08/05/2023. CHF, Aortic Valve Disease, Signs/Symptoms:Dyspnea; Risk Factors:Hypertension and Dyslipidemia. Aortic Valve: 29 mm Medtronic Evolut Pro + valve is present in the aortic position. Procedure Date: 05/28/2019.  Sonographer:    Charlie Jointer RDCS Referring Phys: [id number] ALEAN SAUNDERS MADIREDDY  IMPRESSIONS   1. Left ventricular ejection fraction, by estimation, is 60 to 65%. The left ventricle has normal function. The left ventricle has no regional wall motion abnormalities. Left ventricular diastolic parameters are consistent with Grade II diastolic dysfunction (pseudonormalization). The average left ventricular global longitudinal strain is -18.0 %. The global longitudinal strain is normal. 2. Right ventricular systolic function is normal. The right ventricular size is normal. 3. Left  atrial size was severely dilated. 4. The mitral valve is normal in structure. Mild to moderate mitral valve regurgitation. No evidence of mitral stenosis. 5. TAVR with satisfactory function. Gradients mentioned below.. The aortic valve is normal in structure. Aortic valve regurgitation is mild. No aortic stenosis is present. There is a 29 mm Medtronic Evolut Pro + valve present in the aortic position. Procedure Date: 05/28/2019. 6. The inferior vena cava is normal in size with greater than 50% respiratory variability, suggesting right atrial pressure of 3 mmHg.  FINDINGS Left Ventricle: Left ventricular ejection fraction, by estimation, is 60 to 65%. The left ventricle has normal function. The left ventricle has no regional wall motion abnormalities. The average left ventricular global longitudinal strain is -18.0 %. Strain was performed and the global longitudinal strain is normal. The left  ventricular internal cavity size was normal in size. There is no left ventricular hypertrophy. Left ventricular diastolic parameters are consistent with Grade II diastolic dysfunction (pseudonormalization).  Right Ventricle: The right ventricular size is normal. No increase in right ventricular wall thickness. Right ventricular systolic function is normal.  Left Atrium: Left atrial size was severely dilated.  Right Atrium: Right atrial size was normal in size.  Pericardium: There is no evidence of pericardial effusion.  Mitral Valve: The mitral valve is normal in structure. Mild to moderate mitral valve regurgitation. No evidence of mitral valve stenosis. MV peak gradient, 10.6 mmHg. The mean mitral valve gradient is 3.0 mmHg.  Tricuspid Valve: The tricuspid valve is normal in structure. Tricuspid valve regurgitation is not demonstrated. No evidence of tricuspid stenosis.  Aortic Valve: TAVR with satisfactory function. Gradients mentioned below. The aortic valve is normal in structure. Aortic valve regurgitation is mild. Aortic regurgitation PHT measures 533 msec. No aortic stenosis is present. Aortic valve mean gradient measures 16.4 mmHg. Aortic valve peak gradient measures 31.1 mmHg. Aortic valve area, by VTI measures 2.10 cm. There is a 29 mm Medtronic Evolut Pro + valve present in the aortic position. Procedure Date: 05/28/2019.  Pulmonic Valve: The pulmonic valve was normal in structure. Pulmonic valve regurgitation is not visualized. No evidence of pulmonic stenosis.  Aorta: The aortic root is normal in size and structure.  Venous: The inferior vena cava is normal in size with greater than 50% respiratory variability, suggesting right atrial pressure of 3 mmHg.  IAS/Shunts: No atrial level shunt detected by color flow Doppler.   LEFT VENTRICLE PLAX 2D LVIDd:         5.40 cm   Diastology LVIDs:         3.70 cm   LV e' medial:    7.94 cm/s LV PW:         1.20 cm   LV E/e' medial:   15.5 LV IVS:        1.20 cm   LV e' lateral:   8.16 cm/s LVOT diam:     2.40 cm   LV E/e' lateral: 15.1 LV SV:         140 LV SV Index:   67        2D Longitudinal Strain LVOT Area:     4.52 cm  2D Strain GLS Avg:     -18.0 %   RIGHT VENTRICLE             IVC RV Basal diam:  2.80 cm     IVC diam: 2.00 cm RV Mid diam:    2.80 cm RV S prime:     16.17  cm/s TAPSE (M-mode): 2.2 cm  LEFT ATRIUM              Index        RIGHT ATRIUM           Index LA diam:        5.60 cm  2.69 cm/m   RA Area:     14.50 cm LA Vol (A2C):   104.0 ml 50.01 ml/m  RA Volume:   29.90 ml  14.38 ml/m LA Vol (A4C):   136.0 ml 65.39 ml/m LA Biplane Vol: 122.0 ml 58.66 ml/m AORTIC VALVE AV Area (Vmax):    2.00 cm AV Area (Vmean):   2.01 cm AV Area (VTI):     2.10 cm AV Vmax:           279.00 cm/s AV Vmean:          187.600 cm/s AV VTI:            0.666 m AV Peak Grad:      31.1 mmHg AV Mean Grad:      16.4 mmHg LVOT Vmax:         123.33 cm/s LVOT Vmean:        83.333 cm/s LVOT VTI:          0.310 m LVOT/AV VTI ratio: 0.46 AI PHT:            533 msec  AORTA Ao Root diam: 3.70 cm Ao Asc diam:  3.80 cm  MITRAL VALVE MV Area (PHT): 3.13 cm     SHUNTS MV Peak grad:  10.6 mmHg    Systemic VTI:  0.31 m MV Mean grad:  3.0 mmHg     Systemic Diam: 2.40 cm MV Vmax:       1.63 m/s MV Vmean:      79.6 cm/s MV Decel Time: 243 msec MR Peak grad: 84.3 mmHg MR Vmax:      459.00 cm/s MV E velocity: 123.00 cm/s MV A velocity: 110.00 cm/s MV E/A ratio:  1.12  Jennifer Crape MD Electronically signed by Jennifer Crape MD Signature Date/Time: 12/19/2023/5:22:21 PM    Final    MONITORS  LONG TERM MONITOR (3-14 DAYS) 05/19/2020  Narrative Patch Wear Time:  6 days and 17 hours (2022-02-15T11:45:51-0500 to 2022-02-22T05:42:06-498)  Patient had a min HR of 42 bpm, max HR of 171 bpm, and avg HR of 67 bpm. Predominant underlying rhythm was Sinus Rhythm. 19 Supraventricular Tachycardia runs occurred, the  run with the fastest interval lasting 6 beats with a max rate of 171 bpm, the longest lasting 16 beats with an avg rate of 149 bpm. Idioventricular Rhythm was present. Isolated SVEs were occasional (3.5%, [id number]), SVE Couplets were rare (<1.0%, 475), and SVE Triplets were rare (<1.0%, 28). Isolated VEs were frequent (5.5%, [id number]), VE Couplets were rare (<1.0%, 141), and VE Triplets were rare (<1.0%, 3). Ventricular Bigeminy and Trigeminy were present.  An event monitor was performed for 7 days to assess palpitation. Cardiac rhythm was sinus with average, minimum maximal heart rates of 67, 42 and 121 bpm. There were no pauses of 3 seconds or greater no episodes of second or third-degree AV node block or sinus node exit block. Ventricular ectopy was frequent with PVCs 141 couplets and 3 triplets.  There was one 6 beat run of PVCs accelerated idioventricular rhythm at a rate of 95 to 100 bpm. Supraventricular ectopy was occasional.  There were no episodes of atrial fibrillation or flutter.  There  were 19 brief runs of APCs the longest 16 complexes at a rate of 150 bpm which appeared to be an organized SVT with a long RP interval and inverted P wave.  Conclusion occasional PVCs with couplets triplets and burden of 5.5% and occasional APCs with brief runs of APCs.   CT SCANS  CT CORONARY MORPH W/CTA COR W/SCORE 05/14/2019  Addendum 05/14/2019  5:21 PM ADDENDUM REPORT: 05/14/2019 17:18  CLINICAL DATA:  66 -year-old female with severe aortic stenosis being evaluated for a TAVR procedure.  EXAM: Cardiac TAVR CT  TECHNIQUE: The patient was scanned on a [employer]. A 120 kV retrospective scan was triggered in the descending thoracic aorta at 111 HU's. Gantry rotation speed was 250 msecs and collimation was .6 mm. No beta blockade or nitro were given. The 3D data set was reconstructed in 5% intervals of the R-R cycle. Systolic and diastolic phases were analyzed on a dedicated work station  using MPR, MIP and VRT modes. The patient received 80 cc of contrast.  FINDINGS: Aortic Root:  Aortic valve: Tricuspid  Aortic valve calcium score: 2715  Aortic annulus:  Diameter: 25mm x 22mm  Perimeter: 74mm  Area: 423 mm^2  Calcifications: No calcifications  Coronary height: Min Left -19mm , Max Left -25mm ; Min Right - 13mm  Sinotubular height: Left cusp -26mm ; Right cusp - 18mm; Noncoronary cusp - 20mm  LVOT (as measured 3 mm below the annulus):  Diameter: 26mm x 21mm  Area: 373mm^2  Calcifications: Moderate LVOT calcification located 5mm inferior to annulus, beneath noncoronary cusp  Aortic sinus width: Left cusp - 35mm; Right cusp - 33mm; Noncoronary cusp -34mm;  Sinotubular junction width: 34mm x 32mm  Optimum Fluoroscopic Angle for Delivery: LAO 3 CRA 21  Cardiac:  Right atrium: Normal size  Right ventricle: Mildly dilated  Pulmonary arteries: Mildly dilated, measures 31mm in main PA  Pulmonary veins: Normal configuration  Left atrium: Moderately dilated  Left ventricle: Moderately dilated  Pericardium: Normal thickness  Coronary arteries: Calcium score 494 (88th percentile)  IMPRESSION: 1.  Tricuspid aortic valve, severely calcified (calcium score 2715)  2. Aortic annulus measures 25mm x 22mm in diameter with area 423 mm^2 and perimeter 74mm. No annular calcifications. Annular measurements suitable for delivery of 23mm Edwards-Sapien 3 valve  3. Moderate LVOT calcification located 5mm inferior to annulus, beneath noncoronary cusp  4. Sufficient coronary to annulus distance, measures 19mm from left main to annulus, and 13mm from RCA to annulus  5. Optimum Fluoroscopic Angle for Delivery (centered on right coronary cusp): LAO 3 CRA 21  6.  Coronary calcium score 494 (88th percentile)   Electronically Signed By: Lonni Nanas MD On: 05/14/2019 17:18  Narrative EXAM: OVER-READ INTERPRETATION  CT CHEST  The following  report is an over-read performed by radiologist Dr. Toribio Aye of [facility] on 05/14/2019. This over-read does not include interpretation of cardiac or coronary anatomy or pathology. The coronary calcium score/coronary CTA interpretation by the cardiologist is attached.  COMPARISON:  None.  FINDINGS: Extracardiac findings will be described separately under dictation for contemporaneously obtained CTA chest, abdomen and pelvis.  IMPRESSION: Please see separate dictation for contemporaneously obtained CTA chest, abdomen and pelvis 05/14/2019 for full description of relevant extracardiac findings.  Electronically Signed: By: Toribio Aye M.D. On: 05/14/2019 10:56     ______________________________________________________________________________________________          Recent Labs: 08/04/2023: B Natriuretic Peptide 37.4; Hemoglobin 11.0; Magnesium  2.0; Platelets 178 08/05/2023: ALT 11;  TSH 3.684 08/07/2023: BUN 18; Creatinine, Ser 1.85; Potassium 4.7; Sodium 139  Recent Lipid Panel    Component Value Date/Time   CHOL 198 04/28/2020 1149   TRIG 162 (H) 04/28/2020 1149   HDL 55 04/28/2020 1149   CHOLHDL 3.6 04/28/2020 1149   LDLCALC 115 (H) 04/28/2020 1149    Physical Exam:    VS:  There were no vitals taken for this visit.    Wt Readings from Last 3 Encounters:  12/18/23 225 lb (102.1 kg)  11/22/23 222 lb 6.4 oz (100.9 kg)  08/18/23 237 lb (107.5 kg)     GEN: *** Well nourished, well developed in no acute distress HEENT: Normal NECK: No JVD; No carotid bruits LYMPHATICS: No lymphadenopathy CARDIAC: ***RRR, no murmurs, rubs, gallops RESPIRATORY:  Clear to auscultation without rales, wheezing or rhonchi  ABDOMEN: Soft, non-tender, non-distended MUSCULOSKELETAL:  No edema; No deformity  SKIN: Warm and dry NEUROLOGIC:  Alert and oriented x 3 PSYCHIATRIC:  Normal affect    Signed, Redell Leiter, MD  01/23/2024 5:04 PM    [facility]

## 2024-01-24 ENCOUNTER — Ambulatory Visit: Attending: Cardiology | Admitting: Cardiology

## 2024-01-24 DIAGNOSIS — Z952 Presence of prosthetic heart valve: Secondary | ICD-10-CM

## 2024-01-24 DIAGNOSIS — I2510 Atherosclerotic heart disease of native coronary artery without angina pectoris: Secondary | ICD-10-CM

## 2024-01-24 DIAGNOSIS — I493 Ventricular premature depolarization: Secondary | ICD-10-CM

## 2024-01-24 DIAGNOSIS — I130 Hypertensive heart and chronic kidney disease with heart failure and stage 1 through stage 4 chronic kidney disease, or unspecified chronic kidney disease: Secondary | ICD-10-CM

## 2024-01-24 DIAGNOSIS — E782 Mixed hyperlipidemia: Secondary | ICD-10-CM

## 2024-01-30 ENCOUNTER — Other Ambulatory Visit: Payer: Self-pay | Admitting: Cardiology

## 2024-02-19 ENCOUNTER — Telehealth: Payer: Self-pay | Admitting: Hematology and Oncology

## 2024-02-19 NOTE — Telephone Encounter (Signed)
 Left vm for pt about updated appt scheduled 12/30.SABRA she will see NP Twanda Kendall and not Dr. Odean.. Encouraged to call back with question or concerns

## 2024-03-12 ENCOUNTER — Inpatient Hospital Stay: Admitting: Adult Health

## 2024-03-12 ENCOUNTER — Inpatient Hospital Stay

## 2024-03-12 ENCOUNTER — Ambulatory Visit: Payer: Medicare Other | Admitting: Hematology and Oncology

## 2024-03-27 ENCOUNTER — Inpatient Hospital Stay: Attending: Adult Health | Admitting: Adult Health

## 2024-03-27 ENCOUNTER — Encounter: Payer: Self-pay | Admitting: Adult Health

## 2024-03-27 VITALS — BP 151/78 | HR 72 | Temp 98.5°F | Resp 16 | Ht 65.0 in | Wt 208.9 lb

## 2024-03-27 DIAGNOSIS — D0512 Intraductal carcinoma in situ of left breast: Secondary | ICD-10-CM | POA: Diagnosis not present

## 2024-03-27 DIAGNOSIS — Z170 Estrogen receptor positive status [ER+]: Secondary | ICD-10-CM | POA: Insufficient documentation

## 2024-03-27 DIAGNOSIS — Z800 Family history of malignant neoplasm of digestive organs: Secondary | ICD-10-CM | POA: Insufficient documentation

## 2024-03-27 DIAGNOSIS — Z79810 Long term (current) use of selective estrogen receptor modulators (SERMs): Secondary | ICD-10-CM | POA: Insufficient documentation

## 2024-03-27 DIAGNOSIS — Z1721 Progesterone receptor positive status: Secondary | ICD-10-CM | POA: Insufficient documentation

## 2024-03-27 MED ORDER — TAMOXIFEN CITRATE 20 MG PO TABS: 20.0000 mg | ORAL_TABLET | Freq: Every day | ORAL | 3 refills | Status: AC
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
## 2025-04-02 ENCOUNTER — Inpatient Hospital Stay: Admitting: Hematology and Oncology
